# Patient Record
Sex: Female | Born: 1955 | Race: Black or African American | Hispanic: No | Smoking: Current every day smoker
Health system: Southern US, Community
[De-identification: ages and names within clinical notes are randomized; demographics above are authoritative.]

## PROBLEM LIST (undated history)

## (undated) DIAGNOSIS — I2510 Atherosclerotic heart disease of native coronary artery without angina pectoris: Secondary | ICD-10-CM

## (undated) DIAGNOSIS — J45909 Unspecified asthma, uncomplicated: Secondary | ICD-10-CM

## (undated) DIAGNOSIS — I10 Essential (primary) hypertension: Secondary | ICD-10-CM

## (undated) DIAGNOSIS — M1990 Unspecified osteoarthritis, unspecified site: Secondary | ICD-10-CM

## (undated) DIAGNOSIS — R011 Cardiac murmur, unspecified: Secondary | ICD-10-CM

## (undated) DIAGNOSIS — K5730 Diverticulosis of large intestine without perforation or abscess without bleeding: Secondary | ICD-10-CM

## (undated) DIAGNOSIS — I5030 Unspecified diastolic (congestive) heart failure: Secondary | ICD-10-CM

## (undated) DIAGNOSIS — I428 Other cardiomyopathies: Secondary | ICD-10-CM

## (undated) DIAGNOSIS — I639 Cerebral infarction, unspecified: Secondary | ICD-10-CM

## (undated) DIAGNOSIS — M25561 Pain in right knee: Secondary | ICD-10-CM

## (undated) DIAGNOSIS — I219 Acute myocardial infarction, unspecified: Secondary | ICD-10-CM

## (undated) DIAGNOSIS — G8929 Other chronic pain: Secondary | ICD-10-CM

## (undated) DIAGNOSIS — F0390 Unspecified dementia without behavioral disturbance: Secondary | ICD-10-CM

## (undated) DIAGNOSIS — M25562 Pain in left knee: Secondary | ICD-10-CM

## (undated) DIAGNOSIS — Z993 Dependence on wheelchair: Secondary | ICD-10-CM

## (undated) DIAGNOSIS — J42 Unspecified chronic bronchitis: Secondary | ICD-10-CM

## (undated) HISTORY — PX: ABDOMINAL HYSTERECTOMY: SHX81

## (undated) HISTORY — PX: CORONARY STENT PLACEMENT: SHX1402

---

## 1997-09-18 ENCOUNTER — Encounter: Admission: RE | Admit: 1997-09-18 | Discharge: 1997-09-18 | Payer: Self-pay | Admitting: Family Medicine

## 1997-10-11 ENCOUNTER — Ambulatory Visit: Admission: RE | Admit: 1997-10-11 | Discharge: 1997-10-11 | Payer: Self-pay | Admitting: Obstetrics

## 1997-10-30 ENCOUNTER — Encounter: Admission: RE | Admit: 1997-10-30 | Discharge: 1997-10-30 | Payer: Self-pay | Admitting: Family Medicine

## 1997-11-24 ENCOUNTER — Encounter: Admission: RE | Admit: 1997-11-24 | Discharge: 1997-11-24 | Payer: Self-pay | Admitting: Family Medicine

## 1997-12-27 ENCOUNTER — Encounter: Admission: RE | Admit: 1997-12-27 | Discharge: 1997-12-27 | Payer: Self-pay | Admitting: Family Medicine

## 1998-01-05 ENCOUNTER — Encounter: Admission: RE | Admit: 1998-01-05 | Discharge: 1998-01-05 | Payer: Self-pay | Admitting: Family Medicine

## 1998-02-05 ENCOUNTER — Encounter: Admission: RE | Admit: 1998-02-05 | Discharge: 1998-02-05 | Payer: Self-pay | Admitting: Internal Medicine

## 1998-02-28 ENCOUNTER — Encounter: Admission: RE | Admit: 1998-02-28 | Discharge: 1998-02-28 | Payer: Self-pay | Admitting: Family Medicine

## 1998-04-17 ENCOUNTER — Encounter: Admission: RE | Admit: 1998-04-17 | Discharge: 1998-04-17 | Payer: Self-pay | Admitting: Family Medicine

## 1998-04-26 ENCOUNTER — Emergency Department (HOSPITAL_COMMUNITY): Admission: EM | Admit: 1998-04-26 | Discharge: 1998-04-26 | Payer: Self-pay | Admitting: Emergency Medicine

## 1998-04-29 ENCOUNTER — Emergency Department (HOSPITAL_COMMUNITY): Admission: EM | Admit: 1998-04-29 | Discharge: 1998-04-29 | Payer: Self-pay | Admitting: Emergency Medicine

## 1998-07-24 ENCOUNTER — Inpatient Hospital Stay (HOSPITAL_COMMUNITY): Admission: EM | Admit: 1998-07-24 | Discharge: 1998-07-25 | Payer: Self-pay | Admitting: Emergency Medicine

## 1998-07-31 ENCOUNTER — Ambulatory Visit (HOSPITAL_COMMUNITY): Admission: RE | Admit: 1998-07-31 | Discharge: 1998-07-31 | Payer: Self-pay | Admitting: Obstetrics

## 1998-07-31 ENCOUNTER — Encounter: Admission: RE | Admit: 1998-07-31 | Discharge: 1998-07-31 | Payer: Self-pay | Admitting: Obstetrics & Gynecology

## 1998-07-31 ENCOUNTER — Other Ambulatory Visit: Admission: RE | Admit: 1998-07-31 | Discharge: 1998-07-31 | Payer: Self-pay | Admitting: Obstetrics & Gynecology

## 1998-07-31 ENCOUNTER — Encounter: Admission: RE | Admit: 1998-07-31 | Discharge: 1998-07-31 | Payer: Self-pay | Admitting: Internal Medicine

## 1998-08-02 ENCOUNTER — Encounter: Payer: Self-pay | Admitting: Obstetrics & Gynecology

## 1998-08-02 ENCOUNTER — Ambulatory Visit (HOSPITAL_COMMUNITY): Admission: RE | Admit: 1998-08-02 | Discharge: 1998-08-02 | Payer: Self-pay | Admitting: Obstetrics & Gynecology

## 1998-08-15 ENCOUNTER — Inpatient Hospital Stay (HOSPITAL_COMMUNITY): Admission: RE | Admit: 1998-08-15 | Discharge: 1998-08-20 | Payer: Self-pay | Admitting: Obstetrics & Gynecology

## 1998-08-23 ENCOUNTER — Encounter: Admission: RE | Admit: 1998-08-23 | Discharge: 1998-08-23 | Payer: Self-pay | Admitting: Obstetrics

## 1998-08-31 ENCOUNTER — Inpatient Hospital Stay (HOSPITAL_COMMUNITY): Admission: AD | Admit: 1998-08-31 | Discharge: 1998-08-31 | Payer: Self-pay | Admitting: Obstetrics & Gynecology

## 1998-09-26 ENCOUNTER — Encounter: Admission: RE | Admit: 1998-09-26 | Discharge: 1998-09-26 | Payer: Self-pay | Admitting: Family Medicine

## 1998-10-29 ENCOUNTER — Encounter: Admission: RE | Admit: 1998-10-29 | Discharge: 1998-10-29 | Payer: Self-pay | Admitting: Internal Medicine

## 1998-12-03 ENCOUNTER — Encounter: Admission: RE | Admit: 1998-12-03 | Discharge: 1998-12-03 | Payer: Self-pay | Admitting: Internal Medicine

## 1999-02-13 ENCOUNTER — Encounter: Admission: RE | Admit: 1999-02-13 | Discharge: 1999-02-13 | Payer: Self-pay | Admitting: Internal Medicine

## 1999-05-23 ENCOUNTER — Emergency Department (HOSPITAL_COMMUNITY): Admission: EM | Admit: 1999-05-23 | Discharge: 1999-05-23 | Payer: Self-pay | Admitting: Emergency Medicine

## 1999-05-24 ENCOUNTER — Encounter: Payer: Self-pay | Admitting: Emergency Medicine

## 1999-07-12 ENCOUNTER — Encounter: Admission: RE | Admit: 1999-07-12 | Discharge: 1999-07-12 | Payer: Self-pay | Admitting: Internal Medicine

## 1999-08-23 ENCOUNTER — Encounter: Admission: RE | Admit: 1999-08-23 | Discharge: 1999-08-23 | Payer: Self-pay | Admitting: Internal Medicine

## 1999-11-26 ENCOUNTER — Emergency Department (HOSPITAL_COMMUNITY): Admission: EM | Admit: 1999-11-26 | Discharge: 1999-11-27 | Payer: Self-pay | Admitting: Emergency Medicine

## 1999-11-27 ENCOUNTER — Emergency Department (HOSPITAL_COMMUNITY): Admission: EM | Admit: 1999-11-27 | Discharge: 1999-11-27 | Payer: Self-pay | Admitting: Emergency Medicine

## 1999-11-27 ENCOUNTER — Encounter: Payer: Self-pay | Admitting: Emergency Medicine

## 2000-02-20 ENCOUNTER — Encounter: Admission: RE | Admit: 2000-02-20 | Discharge: 2000-02-20 | Payer: Self-pay | Admitting: Obstetrics

## 2000-09-24 ENCOUNTER — Ambulatory Visit (HOSPITAL_COMMUNITY): Admission: RE | Admit: 2000-09-24 | Discharge: 2000-09-24 | Payer: Self-pay

## 2000-09-24 ENCOUNTER — Encounter: Admission: RE | Admit: 2000-09-24 | Discharge: 2000-09-24 | Payer: Self-pay

## 2001-08-15 ENCOUNTER — Emergency Department (HOSPITAL_COMMUNITY): Admission: EM | Admit: 2001-08-15 | Discharge: 2001-08-15 | Payer: Self-pay | Admitting: Emergency Medicine

## 2001-08-15 ENCOUNTER — Encounter: Payer: Self-pay | Admitting: Emergency Medicine

## 2001-09-21 ENCOUNTER — Emergency Department (HOSPITAL_COMMUNITY): Admission: EM | Admit: 2001-09-21 | Discharge: 2001-09-21 | Payer: Self-pay | Admitting: Emergency Medicine

## 2001-11-29 ENCOUNTER — Emergency Department (HOSPITAL_COMMUNITY): Admission: EM | Admit: 2001-11-29 | Discharge: 2001-11-29 | Payer: Self-pay | Admitting: Emergency Medicine

## 2002-03-08 ENCOUNTER — Encounter: Admission: RE | Admit: 2002-03-08 | Discharge: 2002-03-08 | Payer: Self-pay | Admitting: Internal Medicine

## 2002-03-08 ENCOUNTER — Ambulatory Visit (HOSPITAL_COMMUNITY): Admission: RE | Admit: 2002-03-08 | Discharge: 2002-03-08 | Payer: Self-pay | Admitting: Internal Medicine

## 2003-01-11 ENCOUNTER — Encounter: Admission: RE | Admit: 2003-01-11 | Discharge: 2003-01-11 | Payer: Self-pay | Admitting: Infectious Diseases

## 2004-10-21 ENCOUNTER — Ambulatory Visit: Payer: Self-pay | Admitting: Internal Medicine

## 2004-11-05 ENCOUNTER — Ambulatory Visit: Payer: Self-pay | Admitting: Internal Medicine

## 2004-11-13 ENCOUNTER — Ambulatory Visit: Payer: Self-pay | Admitting: Internal Medicine

## 2004-11-13 ENCOUNTER — Ambulatory Visit (HOSPITAL_COMMUNITY): Admission: RE | Admit: 2004-11-13 | Discharge: 2004-11-13 | Payer: Self-pay | Admitting: Internal Medicine

## 2004-11-14 ENCOUNTER — Ambulatory Visit: Payer: Self-pay | Admitting: Internal Medicine

## 2004-11-20 ENCOUNTER — Ambulatory Visit: Payer: Self-pay | Admitting: Internal Medicine

## 2004-11-21 ENCOUNTER — Encounter: Admission: RE | Admit: 2004-11-21 | Discharge: 2005-02-19 | Payer: Self-pay | Admitting: Internal Medicine

## 2004-11-27 ENCOUNTER — Ambulatory Visit: Payer: Self-pay | Admitting: Internal Medicine

## 2004-12-25 ENCOUNTER — Encounter: Payer: Self-pay | Admitting: Cardiovascular Disease

## 2004-12-25 ENCOUNTER — Ambulatory Visit: Payer: Self-pay | Admitting: Cardiovascular Disease

## 2004-12-25 ENCOUNTER — Ambulatory Visit: Payer: Self-pay | Admitting: Internal Medicine

## 2004-12-25 ENCOUNTER — Ambulatory Visit (HOSPITAL_COMMUNITY): Admission: RE | Admit: 2004-12-25 | Discharge: 2004-12-25 | Payer: Self-pay | Admitting: Internal Medicine

## 2005-01-09 ENCOUNTER — Ambulatory Visit: Payer: Self-pay | Admitting: Internal Medicine

## 2005-02-13 ENCOUNTER — Ambulatory Visit: Payer: Self-pay | Admitting: Internal Medicine

## 2005-02-19 ENCOUNTER — Ambulatory Visit (HOSPITAL_COMMUNITY): Admission: RE | Admit: 2005-02-19 | Discharge: 2005-02-19 | Payer: Self-pay | Admitting: Internal Medicine

## 2005-03-27 ENCOUNTER — Ambulatory Visit: Payer: Self-pay | Admitting: Internal Medicine

## 2005-11-24 ENCOUNTER — Ambulatory Visit: Payer: Self-pay | Admitting: Internal Medicine

## 2006-03-26 DIAGNOSIS — J449 Chronic obstructive pulmonary disease, unspecified: Secondary | ICD-10-CM

## 2006-03-26 DIAGNOSIS — K219 Gastro-esophageal reflux disease without esophagitis: Secondary | ICD-10-CM

## 2006-03-26 DIAGNOSIS — M1990 Unspecified osteoarthritis, unspecified site: Secondary | ICD-10-CM | POA: Insufficient documentation

## 2006-03-26 DIAGNOSIS — I428 Other cardiomyopathies: Secondary | ICD-10-CM | POA: Insufficient documentation

## 2006-05-10 ENCOUNTER — Emergency Department (HOSPITAL_COMMUNITY): Admission: EM | Admit: 2006-05-10 | Discharge: 2006-05-10 | Payer: Self-pay | Admitting: Emergency Medicine

## 2006-06-30 DIAGNOSIS — F17210 Nicotine dependence, cigarettes, uncomplicated: Secondary | ICD-10-CM

## 2006-08-02 ENCOUNTER — Inpatient Hospital Stay (HOSPITAL_COMMUNITY): Admission: EM | Admit: 2006-08-02 | Discharge: 2006-08-08 | Payer: Self-pay | Admitting: Emergency Medicine

## 2006-09-22 ENCOUNTER — Encounter: Payer: Self-pay | Admitting: Internal Medicine

## 2007-02-08 ENCOUNTER — Emergency Department (HOSPITAL_COMMUNITY): Admission: EM | Admit: 2007-02-08 | Discharge: 2007-02-08 | Payer: Self-pay | Admitting: *Deleted

## 2007-02-27 ENCOUNTER — Encounter: Admission: RE | Admit: 2007-02-27 | Discharge: 2007-02-27 | Payer: Self-pay | Admitting: Orthopedic Surgery

## 2007-05-13 ENCOUNTER — Encounter: Payer: Self-pay | Admitting: Internal Medicine

## 2008-02-17 ENCOUNTER — Inpatient Hospital Stay (HOSPITAL_COMMUNITY): Admission: EM | Admit: 2008-02-17 | Discharge: 2008-02-27 | Payer: Self-pay | Admitting: Emergency Medicine

## 2008-02-24 ENCOUNTER — Encounter (INDEPENDENT_AMBULATORY_CARE_PROVIDER_SITE_OTHER): Payer: Self-pay | Admitting: Internal Medicine

## 2008-02-28 ENCOUNTER — Emergency Department (HOSPITAL_COMMUNITY): Admission: EM | Admit: 2008-02-28 | Discharge: 2008-02-28 | Payer: Self-pay | Admitting: Emergency Medicine

## 2008-03-17 ENCOUNTER — Emergency Department (HOSPITAL_COMMUNITY): Admission: EM | Admit: 2008-03-17 | Discharge: 2008-03-17 | Payer: Self-pay | Admitting: Emergency Medicine

## 2008-03-28 ENCOUNTER — Encounter: Admission: RE | Admit: 2008-03-28 | Discharge: 2008-05-08 | Payer: Self-pay | Admitting: Orthopedic Surgery

## 2008-04-26 ENCOUNTER — Emergency Department (HOSPITAL_COMMUNITY): Admission: EM | Admit: 2008-04-26 | Discharge: 2008-04-26 | Payer: Self-pay | Admitting: Emergency Medicine

## 2010-10-22 NOTE — Group Therapy Note (Signed)
NAME:  [name], [name]              ACCOUNT NO.:  [id number]   MEDICAL RECORD NO.:  [id number]          PATIENT TYPE:  INP   LOCATION:  5528                         FACILITY:  [facility]   PHYSICIAN:  Eduard Clos, MDDATE OF BIRTH:  1955-12-18                                 PROGRESS NOTE   COURSE IN THE HOSPITAL:  A 55 year old female with history of sigmoid diverticulitis and  hypertension previously presented with abdominal pain.  On admission,  the patient had a CT scan of the abdomen and pelvis, which showed  question of sigmoid diverticulitis and a 4-cm abscess in the central  pelvis.  The patient admitted and started on empiric antibiotic.  Surgery consult also was obtained.  Per surgery, interventional  radiology to be consulted for possible percutaneous drainage.  Per  interventional radiology, the patient's abscess was in the central  pelvis, difficult anatomy to drain it percutaneously and suggested many  organs and felt to continue the antibiotics and repeat CT scan when  appropriate.  At this time, the patient also was found to have a rising  creatinine level, in which the patient had a creatinine level of 0.85,  which started increasing to 2.8 to 3.47.  The patient was started on  aggressive IV fluids, and now the creatinine has decreased to 2.7.  Felt  to be secondary to contrast-induced and with mild hypotension this.  The  patient also is found now to be in sinus bradycardia, for which an EKG  will be ordered.  It is felt to be secondary to Catapres and  medications.  At this time, surgery is planning to repeat a CT scan  again as the patient still is tender in the abdomen.  Further  recommendations will be accordingly.   FINAL DIAGNOSES:  1. Sigmoid diverticulitis with abscess.  2. Acute renal failure, probably secondary to contrast-induced.  3. Sinus bradycardia, probably secondary to Catapres and pain relief      medication.  4. History of hypertension.   PLAN:  At this time, a CT scan of the abdomen and pelvis is planned to be again  scheduled once the patient's creatinine improves, to have an EKG to see  her rhythm as the patient has mild bradycardia.  We will stop the  Catapres and place her on p.r.n. hydralazine for blood pressure control,  and further recommendations as the patient's condition evolves.      Eduard Clos, MD  Electronically Signed     ANK/MEDQ  D:  02/22/2008  T:  02/22/2008  Job:  [id number]

## 2010-10-22 NOTE — Discharge Summary (Signed)
[name], [name]              ACCOUNT NO.:  [id number]   MEDICAL RECORD NO.:  [id number]          PATIENT TYPE:  INP   LOCATION:  5528                         FACILITY:  [facility]   PHYSICIAN:  Altha Harm, MDDATE OF BIRTH:  1955/07/17   DATE OF ADMISSION:  02/17/2008  DATE OF DISCHARGE:  02/27/2008                               DISCHARGE SUMMARY   DISCHARGE DISPOSITION:  Home.   FINAL DISCHARGE DIAGNOSES:  1. Sigmoid diverticulitis with abscess.  2  Colovaginal fistula.  1. Medication-induced bradycardia, now resolved.  2. Generalized weakness.  3. Hypertension.  4. History of coronary artery disease with myocardial infarction in      the past.  5. Chronic renal insufficiency.  6. Chronic kidney disease, stage III.   DISCHARGE MEDICATIONS:  1. Amlodipine 5 mg p.o. daily.  2. Isosorbide mononitrate 30 mg p.o. daily.  3. Doxazosin 2 mg p.o. daily.  4. Protonix 40 mg p.o. daily.  5. Flagyl 500 mg p.o. q.8 h., x2 weeks.  6. Ciprofloxacin 500 mg p.o. daily x2 weeks.   CONSULTANTS:  1. Dr. Luretha Murphy, surgery.  2. Dr. Lynnea Ferrier, cardiology.   DIAGNOSTIC STUDIES:  1. A 2-D echocardiogram which showed overall left ventricular systolic      function normal.  No diagnostic evidence of left ventricular      regional wall motion abnormalities.  Left ventricular wall      thickness markedly increased.  Doppler evidence for a dynamic left      ventricular outflow tract obstruction at rest.  Doppler parameters      consistent with a normal left ventricular relaxation.  2. CT of the abdomen and pelvis without contrast done on February 23, 2008, which showed (1) small to moderate bilateral pleural      effusions, (2) stranding within the left soft-tissue of the      anterior lateral left flank and right back, questionable      significance, no abscess, (3)  colovaginal fistula suspected.      There has been some decrease in the size of the mid pelvic abscess  cavity noted previously.  No change in sigmoid colon      diverticulosis.   CODE STATUS:  Full code.   ALLERGIES:  PENICILLIN.   CHIEF COMPLAINT:  Abdominal pain.   HISTORY OF PRESENT ILLNESS:  Please see H and P dictated by Dr.  Ardyth Harps on February 17, 2008, for details of the HPI.  However, in  short, this is a 55 year old [name] who presents with  left lower quadrant pain x3 days.   HOSPITAL COURSE:  Please note that there is an interim summary done up  until February 22, 2008.  This summary summarizes hospital course from  February 23, 2008, through February 27, 2008.   1. Sigmoid abscess and colovaginal fistula.  The patient has sigmoid      abscess and diverticulitis.  A CT scan of the abdomen showed a      colovaginal fistula.  In consultation with Dr. Daphine Deutscher, it was      decided that  the patient would continue on oral antibiotic      ciprofloxacin for an additional 2 weeks and then return to see him      at which time he would determine whether or not surgical      intervention was necessary.  Thus, the patient is being discharged      on Flagyl and ciprofloxacin for a total of 2 weeks and then to      follow up with Dr. Daphine Deutscher in the clinic to determine the further      course of therapy for her fistula and abscess.  2. Sinus bradycardia.  The patient had been on multiple rate-limiting      medications, including doxazosin.  However, the patient had been on      a large dose of trazodone and it was felt that this may have been      the contributory factor to her bradycardia.  However, given the      patient's history of coronary artery disease and previous MI, it      was felt that it warranted further intervention.  Dr. Lynnea Ferrier of      cardiology was consulted and saw the patient in consultation.  By      this time, the trazodone had been stopped, in addition to the      clonidine and the doxazosin.  The patient over several days      recovered at  to a normal cardiac rate of heart beat.  Doxazosin at      night was reintroduced.  The patient was cautioned not to resume      her trazodone as it was felt that this was the major contributing      factor to her bradycardia.  The patient is being discharged on      trazodone 2 mg p.o. nightly, as well as Norvasc to control her      blood pressure.  3. Hypertension.  The patient was on Norvasc, trazodone and clonidine.      However, because of the bradycardia, the clonidine was      discontinued.  While off of the doxazosin and clonidine, she had an      increase in blood pressure, however, when the doxazosin was      reintroduced, her blood pressures were controlled.  She is being      sent home on Norvasc and doxazosin for her blood pressure      management.  Otherwise, the patient remained stable.  The patient      has a limited understanding of her condition and instructions, thus      instructions were given to the patient's daughter and      granddaughter, both by myself and the nurse.  They demonstrated      understanding.   DIETARY RESTRICTIONS:  Include a low-sodium, low-cholesterol diet, and  in particular, the patient was advised to be on a low residue diet and  written instructions given to that effect.   PHYSICAL RESTRICTIONS:  None.   FOLLOW UP:  The patient was instructed to call Dr. Ermalene Searing office for  follow-up.  She is to continue on the oral antibiotics for a total of 2  weeks, and then Dr. Daphine Deutscher intends see her in the office possibly for a  one-step colostomy for treatment of her abscess and colovaginal fistula.      Altha Harm, MD  Electronically Signed     MAM/MEDQ  D:  03/29/2008  T:  03/29/2008  Job:  [id number]

## 2010-10-22 NOTE — Consult Note (Signed)
NAME:  [name], [name]              ACCOUNT NO.:  [id number]   MEDICAL RECORD NO.:  [id number]          PATIENT TYPE:  INP   LOCATION:  5151                         FACILITY:  [facility]   PHYSICIAN:  Gabrielle Dare. Janee Morn, M.D.DATE OF BIRTH:  06-Nov-1955   DATE OF CONSULTATION:  DATE OF DISCHARGE:                                 CONSULTATION   CHIEF COMPLAINT:  Lower abdominal pain.   HISTORY OF PRESENT ILLNESS:  Ms. Escamilla is a pleasant 55 year old  African American female who came to the emergency department complaining  of lower abdominal pain in the suprapubic region.  She complains it is  similar to a previous episode of diverticulitis which she had in 2008.  At that time, she was seen by my partner Dr. Chevis Pretty.  It was  recommended after she improved with medical treatment that she have  elective sigmoid colectomy.  However, she did not want to have surgery  that time.  Currently, she complains of a lower abdominal pain and in  addition has some chronic back pain issues that she also complains of.   PAST MEDICAL HISTORY:  1. Chronic back pain.  2. Cerebrovascular accident.  3. Previous episode of diverticulitis.  4. Hypertension.  5. Myocardial infarction.   PAST SURGICAL HISTORY:  Open cholecystectomy, appendectomy,  hysterectomy, and right oophorectomy.   MEDICATIONS:  Include, amlodipine, Benicar, clonidine, and trazodone.   ALLERGIES:  PENICILLIN.   SOCIAL HISTORY:  She smokes cigarettes.  She occasionally drinks  alcohol.   REVIEW OF SYSTEMS:  CARDIAC:  Negative.  PULMONARY:  Negative.  GI:  As above.  GU:  Negative.  MUSCULOSKELETAL:  A chronic back pain with pain going down her right leg  and she uses a cane to walk.   Remainder of the review of systems was unremarkable.   PHYSICAL EXAMINATION:  VITAL SIGNS:  Temperature 98.7, blood pressure  172/89, heart rate 65, respirations 16, and saturations 95% on room air.  GENERAL:  She is awake and alert.  HEENT:   She wears glasses.  Oral mucosa is moist.  NECK:  Supple.  No tenderness.  LUNGS:  Clear to auscultation.  No wheezing is heard.  HEART:  Regular.  No murmurs are heard.  Impulse is palpable in the left  chest.  ABDOMEN:  Soft.  She has some tenderness in the suprapubic and bilateral  lower quadrant region, right side greater than left side.  There is no  generalized tenderness or guarding.  There is no peritoneal signs.  Bowel sounds are present.  No masses are felt.  EXTREMITIES:  No significant edema.  SKIN:  Warm and dry.  No rashes are noted.  Surgical scars are present  in her abdominal skin.   Data reviewed includes, white blood cell count 10.9, hemoglobin 15.8.  Basic metabolic panel is unremarkable with the exception of glucose of  121, AST 18, ALT 9, lipase 19.  A CT scan of the abdomen and pelvis show  sigmoid diverticulitis with 4- cm abscess in her pelvis.   IMPRESSION:  Sigmoid diverticulitis with abscess.   RECOMMENDATIONS:  I agree  with medical admission and IV antibiotics.  Cipro and Flagyl were ordered in the emergency department.  We would  also recommend CT-guided drainage of this abscess in the a.m.  We will  follow along with you.  Hopefully, she will improve with medical  treatment.  I do feel she will need an elective sigmoid colectomy once  her acute illness has resolved.      Gabrielle Dare Janee Morn, M.D.  Electronically Signed     BET/MEDQ  D:  02/17/2008  T:  02/18/2008  Job:  [id number]   cc:   Peggye Pitt, M.D.

## 2010-10-22 NOTE — H&P (Signed)
NAME:  [name], [name]              ACCOUNT NO.:  [id number]   MEDICAL RECORD NO.:  [id number]          PATIENT TYPE:  EMS   LOCATION:  MAJO                         FACILITY:  [facility]   PHYSICIAN:  Peggye Pitt, M.D. DATE OF BIRTH:  Nov 12, 1955   DATE OF ADMISSION:  02/17/2008  DATE OF DISCHARGE:                              HISTORY & PHYSICAL   PRIMARY CARE PHYSICIAN:  She is unassigned to [place].   CHIEF COMPLAINT:  Abdominal pain.   HISTORY OF PRESENT ILLNESS:  [name] is a 55 year old African  American woman who presents with left lower quadrant abdominal pain x3  days.  She does endorse nausea but denies any actual emesis.  She states  she has had shaking chills.  She had an admission in 2008, for a sigmoid  diverticulitis and states the pain is the same.   ALLERGIES:  She stated allergies to PENICILLIN.   PAST MEDICAL HISTORY:  Significant for:  1. Hypertension.  2. History of myocardial infarction, she is unsure of the date of      this.   HOME MEDICATIONS:  1. Amlodipine 5 mg p.o. daily.  2. Benicar 20 mg p.o. daily.  3. Clonidine 0.2 mg p.o. b.i.d.  4. Trazodone 25 mg p.o. daily.   SOCIAL HISTORY:  She denies any alcohol, tobacco, or illicit drug use.  She is retired.   FAMILY HISTORY:  Noncontributory.   REVIEW OF SYSTEMS:  A 10-point system review was performed, which is  negative except as per HPI.   PHYSICAL EXAMINATION:  VITAL SIGNS:  Blood pressure initially upon  arrival is 222/104, which decreased to 172/89 simply with pain  medication, heart rate 79, respiration 16, and O2 sat 98% on room air.  GENERAL:  She is alert, awake, and oriented x3 and in mild distress  secondary to abdominal pain.  HEENT:  Normocephalic and atraumatic.  Her pupils are equal and reactive  to light and accommodation with intact extraocular movements.  NECK:  Supple with no JVD.  No lymphadenopathy or bruits.  LUNGS:  Clear to auscultation bilaterally.  HEART:  Regular rate  and rhythm with no murmurs, rubs, or gallops.  ABDOMEN:  Obese and very tender to palpation to the left lower quadrant  specifically, but also in the suprapubic and the left upper quadrant  region.  She has hyperactive bowel sounds.  No masses or organomegaly  noted.  She has negative rebound.   LABORATORY DATA:  Labs upon admission, sodium 139, potassium 3.7,  chloride 101, bicarb 29, BUN 5, creatinine 0.85 with a glucose of 121.  All of her LFTs are within normal limits.  She has WBC of 10.9 with an  ANC of 9.4, hemoglobin 15.8, and platelets of 194.  Lipase of 19 and a  negative urine analysis.  She had a CT scan of her abdomen and pelvis  that was consistent with sigmoid diverticulitis with a 4-cm abscess in  the central pelvis.   ASSESSMENT AND PLAN:  1. For sigmoid diverticulitis, we will admit her to regular floor,      give intravenous fluids.  We  will treat medically with      ciprofloxacin and Flagyl.  Given the abscess, I have consulted      Surgery and they have recommended medical management and      Interventional Radiology consult for a possible drainage of the      abscess and given her recurrent diverticulitis, they also recommend      a possible elective sigmoid colectomy.  2. For her hypertension, we will continue her home medications.  3. For prophylaxis while in the hospital, place on Protonix for      gastrointestinal prophylaxis and on a subcutaneous heparin for deep      vein thrombosis prophylaxis.      Peggye Pitt, M.D.  Electronically Signed     EH/MEDQ  D:  02/17/2008  T:  02/18/2008  Job:  [id number]

## 2010-10-25 NOTE — H&P (Signed)
[name], [name]              ACCOUNT NO.:  [id number]   MEDICAL RECORD NO.:  [id number]          PATIENT TYPE:  INP   LOCATION:  0105                         FACILITY:  [facility]   PHYSICIAN:  Ollen Gross. Vernell Morgans, M.D. DATE OF BIRTH:  02-28-1956   DATE OF ADMISSION:  08/02/2006  DATE OF DISCHARGE:                              HISTORY & PHYSICAL   [name] is a 55-year black female who presents to emergency  department today with a couple week history of low pelvic pain.  The  pain has gotten steadily worse during the last 2 weeks.  She denies any  fevers at home.  She has had some diarrhea.  She has not had any nausea  or vomiting.  No chest pain, shortness of breath.  No dysuria.  She has  not noticed any blood in her stools.  The rest of the review of systems  unremarkable.   PAST MEDICAL HISTORY:  Significant for hypertension, coronary artery  disease, MI x2, stroke x2.   PAST SURGICAL HISTORY:  Significant for cholecystectomy, appendectomy,  hysterectomy, right oophorectomy.   MEDICATIONS:  Include Darvocet, trazodone, hydrochlorothiazide and  clonidine.   ALLERGIES:  PENICILLIN.   SOCIAL HISTORY:  She does smoke cigarettes and occasionally drinks  alcohol.   FAMILY HISTORY:  Is noncontributory.   PHYSICAL EXAM:  VITAL SIGNS:  Her temperature 98.5, blood pressure  236/96, pulse 57.  GENERAL:  She is a well-nourished black female in no acute distress.  SKIN:  Warm and dry, no jaundice.  EYES:  Extraocular muscles intact. Pupils equal, round, reactive to  light.  Sclerae nonicteric.  LUNGS:  Clear bilaterally with no use of accessory muscles.  HEART:  Regular rate and rhythm with an impulse in the left chest.  ABDOMEN:  Soft.  She has got moderate pubic tenderness centrally but no  guarding or peritonitis.  No palpable mass or hepatosplenomegaly.  EXTREMITIES:  No cyanosis, clubbing or edema.  Has good strength in arms  and legs.  PSYCH:  Psychologically, she is  alert and oriented x3, with no evidence  of anxiety or depression.  On review of her lab work, it was significant  for a white count of 8,900.  Her CT scan is reviewed and did show some  inflammation of the sigmoid colon with a small 2- cm abscess between the  colon and the left adnexal area.   ASSESSMENT/PLAN:  This is a 55 year old black female with what appears  to be an infectious process of her sigmoid colon, of her pelvic area  involving her sigmoid colon.  It is not completely clear whether this is  a tubo-ovarian abscess or sigmoid diverticulitis, but is probably most  likely a sigmoid diverticulitis with a small abscess.  Because she shows  no signs of sepsis or peritonitis, I think it would be reasonable to  admit her for broad spectrum antibiotic therapy and follow her closely.  Will also talk to interventional radiology to see if this abscess can be  drained, and we will consult the medical teaching service to help [place]  manage her hypertension,  coronary artery  disease during this hospitalization.  She will probably need cardiac  clearance as well.  We will ask for the help of the medical doctors with  this, since we do not know who her cardiologist was when she had her  heart attack.  She understands that if she does not improve, that she  may require exploration.      Ollen Gross. Vernell Morgans, M.D.  Electronically Signed     PST/MEDQ  D:  08/02/2006  T:  08/02/2006  Job:  [id number]

## 2010-10-25 NOTE — Discharge Summary (Signed)
NAMETRINITEY, ROACHE              ACCOUNT NO.:  [id number]   MEDICAL RECORD NO.:  [id number]          PATIENT TYPE:  INP   LOCATION:  1343                         [facility]   PHYSICIAN:  Ollen Gross. Vernell Morgans, M.D. DATE OF BIRTH:  06-19-1955   DATE OF ADMISSION:  08/02/2006  DATE OF DISCHARGE:  08/08/2006                               DISCHARGE SUMMARY   Ms. Ende is a 55 year old black female who was admitted initially  with pelvic pain and diarrhea.  She had a normal white count at that  time but significant hypertension.  She had a CT scan that showed some  inflammation of the sigmoid colon with small abscess.  She had no signs  of peritonitis, and she was admitted for hydration, IV antibiotics, and  bowel rest.  She improved.  The medical service was contacted to manage  her hypertension and coronary artery disease.  She did well on IV  antibiotics and slowly improved, and by August 08, 2006 she was switched  to oral antibiotics, she was tolerating a low-residue diet, and was  ready for discharge home.  She was given Cipro and Flagyl for  antibiotics.  Her antihypertensives were managed by the medical service.  Follow up will be with Dr. Carolynne Edouard in a couple of weeks.   FINAL DIAGNOSIS:  Sigmoid diverticulitis with abscess.   CONDITION ON DISCHARGE:  Stable.   DIET:  Low residue.   DISPOSITION:  She is discharged home.      Ollen Gross. Vernell Morgans, M.D.  Electronically Signed     PST/MEDQ  D:  11/27/2006  T:  11/27/2006  Job:  [id number]

## 2010-10-25 NOTE — Consult Note (Signed)
[name], [name]              ACCOUNT NO.:  [id number]   MEDICAL RECORD NO.:  [id number]          PATIENT TYPE:  INP   LOCATION:  0105                         FACILITY:  [facility]   PHYSICIAN:  Madaline Savage, MD        DATE OF BIRTH:  04-Nov-1955   DATE OF CONSULTATION:  08/02/2006  DATE OF DISCHARGE:                                 CONSULTATION   PRIMARY CARE PHYSICIAN:  Ellie Lunch, M.D., [facility].   SURGEON:  Ollen Gross. Vernell Morgans, M.D.   HISTORY OF PRESENT ILLNESS:  [name] is a 55 year old lady with a  history of hypertension, coronary artery disease, and multiple strokes  in the past who comes in with complaint of abdominal pain of one week  duration. She says the abdominal pain is in the hypogastrium and has  been getting worse for the last one week.  She rates her pain at 9/10.  She denies having any aggravating or alleviating factors.  She states  the pain does not radiate.  She denies any nausea or vomiting but did  say that she had some diarrhea.  She denies any chest pain, any  shortness of breath, any palpitations, or any other complaints at this  point in time.   PAST MEDICAL HISTORY:  1. Hypertension.  2. History of coronary artery disease with MI x2 she says      approximately 5-10 years ago.  3. History of multiple strokes approximately again 5-10 years ago.      She says she does not have any residue weakness, but she does have      slurred speech which is secondary to her stroke.   PAST SURGICAL HISTORY:  1. Cholecystectomy.  2. Appendectomy.  3. Tonsillectomy.  4. She says one of her ovaries was removed in the past.   ALLERGIES:  She is allergic to PENICILLIN.  She is not sure of the  allergy.   CURRENT MEDICATIONS:  1. Triamterene/hydrochlorothiazide 37.5/25 one tablet once daily.  2. Clonidine 0.1 mg twice daily.  3. Trazodone 50 mg 1-2 tablets at bedtime.  4. Darvocet-N 100 as needed.   SOCIAL HISTORY:  She smokes approximately one pack in  one week.  She  says she started smoking at the age of 72.  She denies any drug abuse.  She takes alcohol socially.   FAMILY HISTORY:  Her dad passed away at the age of 67.  She was not sure  what he passed away from.  Her mother passed away when she was  approximately 17.  She had diabetes.   REVIEW OF SYSTEMS:  GENERAL:  She denies any recent weight loss, weight  gain.  No fever, chills.  HEENT: No headaches, no blurred vision.  CARDIOVASCULAR:  Denies chest pain or palpitations.  RESPIRATORY:  No  shortness of breath.  GI:  Does have pain in her hypogastrium.  No  nausea, vomiting, but does complain of diarrhea.  EXTREMITIES: No  tingling or numbness of toes.   PHYSICAL EXAMINATION:  GENERAL:  Alert and oriented x3.  VITAL SIGNS: Temperature 98.5, pulse  rate 57, blood pressure markedly  elevated at 236/91 on admission.  Now it has come down to 220/91.  Respiratory rate is 20 per minute.  Oxygen saturation 98%.  HEENT:  Normocephalic and atraumatic.  Pupils equal, round, and reactive  to light.  Mucous membranes moist.  NECK:  Supple.  No JVD or carotid bruit.  CARDIOVASCULAR:  S1, S2.  CHEST: Clear to auscultation.  ABDOMEN:  Soft.  There is tenderness in the hypogastrium.  Bowel sounds  are decreased.  EXTREMITIES:  No edema, cyanosis, or clubbing.   LABORATORY DATA:  White count 8.9, hemoglobin 15.9, hematocrit 46.6,  platelets 180.  Potassium 3.9, sodium 141, chloride 106, bicarb 29, BUN  10, creatinine 0.76, glucose 93.   CT abdomen showed inflammation in low pelvis, 2.5 cm ring enhancing  fluid/gas collection, questionable infection or abscess.  Sigmoidal wall  thickening would suggest colitis.   ASSESSMENT AND PLAN:  1. Uncontrolled hypertension. This is a 55 year old lady with a      history of hypertension.  Most likely her blood pressure was      uncontrolled as an outpatient.  She now has a blood pressure of      220/91.  She tells me that she has not taken her  blood pressure      medications today because of her abdominal pain.  Her blood      pressure is probably elevated secondary to her pain, too.  I would      restart her home medications.  I would add Norvasc to it.  I would      watch her response at this point in time and titrate medications      accordingly.  2. History of coronary artery disease status post myocardial      infarction x2.  She is chest pain free at this point in time.  I      will get a 12-lead EKG.  She denies having any pain.  I will start      her on aspirin.  3. History of stroke.  She has had multiple strokes in the past with      very high blood pressure rate.  Will have to watch for a      cerebrovascular event.  I would again start her on the aspirin .      Will continue to watch her.  4. Sigmoid diverticulitis with abscess.  Dr. Carolynne Edouard planning on draining      her abscess by interventional radiology.  She is on Cipro and      Flagyl at this point in time.  She is not septic.  She does not      look toxic at this point in time.  We will continue to follow this      patient with you.   Thank you for consulting this patient to [place].      Madaline Savage, MD  Electronically Signed     PKN/MEDQ  D:  08/02/2006  T:  08/02/2006  Job:  [id number]

## 2011-03-10 LAB — BASIC METABOLIC PANEL
BUN: 10
CO2: 21
CO2: 21
CO2: 21
Calcium: 8.5
Calcium: 8.7
Chloride: 117 — ABNORMAL HIGH
Chloride: 119 — ABNORMAL HIGH
Creatinine, Ser: 2.02 — ABNORMAL HIGH
Creatinine, Ser: 2.4 — ABNORMAL HIGH
Creatinine, Ser: 2.71 — ABNORMAL HIGH
GFR calc Af Amer: 26 — ABNORMAL LOW
GFR calc non Af Amer: 27 — ABNORMAL LOW
Glucose, Bld: 102 — ABNORMAL HIGH
Glucose, Bld: 83
Potassium: 4.3
Potassium: 5.2 — ABNORMAL HIGH
Potassium: 5.3 — ABNORMAL HIGH
Sodium: 139
Sodium: 142

## 2011-03-10 LAB — PHOSPHORUS: Phosphorus: 2.6

## 2011-03-10 LAB — COMPREHENSIVE METABOLIC PANEL
ALT: 22
AST: 36
Alkaline Phosphatase: 37 — ABNORMAL LOW
CO2: 20
Chloride: 109
GFR calc Af Amer: 30 — ABNORMAL LOW
GFR calc non Af Amer: 25 — ABNORMAL LOW
Sodium: 139
Total Bilirubin: 0.4

## 2011-03-10 LAB — TSH: TSH: 2.295

## 2011-03-10 LAB — CBC
Hemoglobin: 13.2
MCHC: 32.7
RBC: 4.8
WBC: 6.5

## 2011-03-10 LAB — T4, FREE: Free T4: 1.11

## 2011-03-10 LAB — MAGNESIUM: Magnesium: 1.9

## 2011-03-10 LAB — CLOSTRIDIUM DIFFICILE EIA

## 2011-03-12 LAB — COMPREHENSIVE METABOLIC PANEL
ALT: 9
AST: 18
Albumin: 3.7
Alkaline Phosphatase: 65
BUN: 5 — ABNORMAL LOW
Chloride: 101
GFR calc Af Amer: >60
Potassium: 3.7
Sodium: 139
Total Bilirubin: 0.9

## 2011-03-12 LAB — CULTURE, BLOOD (ROUTINE X 2)

## 2011-03-12 LAB — CLOSTRIDIUM DIFFICILE EIA

## 2011-03-12 LAB — CBC
HCT: 47.9 — ABNORMAL HIGH
Hemoglobin: 12.1
Platelets: 194
RDW: 16.1 — ABNORMAL HIGH
WBC: 10.9 — ABNORMAL HIGH

## 2011-03-12 LAB — GLUCOSE, CAPILLARY
Glucose-Capillary: 101 — ABNORMAL HIGH
Glucose-Capillary: 112 — ABNORMAL HIGH
Glucose-Capillary: 113 — ABNORMAL HIGH
Glucose-Capillary: 114 — ABNORMAL HIGH
Glucose-Capillary: 117 — ABNORMAL HIGH
Glucose-Capillary: 119 — ABNORMAL HIGH
Glucose-Capillary: 72
Glucose-Capillary: 74
Glucose-Capillary: 86
Glucose-Capillary: 97
Glucose-Capillary: 98

## 2011-03-12 LAB — URINALYSIS, ROUTINE W REFLEX MICROSCOPIC
Bilirubin Urine: NEGATIVE
Hgb urine dipstick: NEGATIVE
Protein, ur: 100 — AB
Urobilinogen, UA: 0.2

## 2011-03-12 LAB — URINE MICROSCOPIC-ADD ON

## 2011-03-12 LAB — BASIC METABOLIC PANEL
BUN: 11
BUN: 12
CO2: 22
Calcium: 7.9 — ABNORMAL LOW
Calcium: 8.2 — ABNORMAL LOW
Chloride: 114 — ABNORMAL HIGH
GFR calc Af Amer: 17 — ABNORMAL LOW
GFR calc non Af Amer: 14 — ABNORMAL LOW
GFR calc non Af Amer: 17 — ABNORMAL LOW
Glucose, Bld: 103 — ABNORMAL HIGH
Glucose, Bld: 97
Potassium: 3.1 — ABNORMAL LOW
Potassium: 4.8
Sodium: 139

## 2011-03-12 LAB — DIFFERENTIAL
Basophils Absolute: 0
Basophils Relative: 0
Eosinophils Absolute: 0
Eosinophils Relative: 0
Monocytes Absolute: 0.3

## 2011-03-21 LAB — D-DIMER, QUANTITATIVE: D-Dimer, Quant: 0.42

## 2011-08-13 ENCOUNTER — Emergency Department (HOSPITAL_COMMUNITY)
Admission: EM | Admit: 2011-08-13 | Discharge: 2011-08-13 | Disposition: A | Discharge status: Home / Self Care | Payer: Medicaid Other | Source: Home / Self Care | Attending: Emergency Medicine | Admitting: Emergency Medicine

## 2011-08-13 ENCOUNTER — Encounter (HOSPITAL_COMMUNITY): Payer: Self-pay

## 2011-08-13 DIAGNOSIS — IMO0002 Reserved for concepts with insufficient information to code with codable children: Secondary | ICD-10-CM

## 2011-08-13 HISTORY — DX: Essential (primary) hypertension: I10

## 2011-08-13 MED ORDER — TRAMADOL HCL 50 MG PO TABS
100.0000 mg | ORAL_TABLET | Freq: Three times a day (TID) | ORAL | Status: DC | PRN
Start: 2011-08-13 — End: 2011-08-15

## 2011-08-13 MED ORDER — SULFAMETHOXAZOLE-TMP DS 800-160 MG PO TABS: 2.0000 | ORAL_TABLET | Freq: Two times a day (BID) | ORAL | Status: DC

## 2011-08-13 NOTE — ED Provider Notes (Signed)
Chief Complaint  Patient presents with  . Hand Pain    History of Present Illness:   The patient has acrylic nails on her nails. About a week ago she partially tore off the acrylic nail in the right middle finger. The technician attempted to do this back down with acrylic glue. Over the past 3 days she's had swelling, redness, and pain over the tip of the finger.  Review of Systems:  Other than noted above, the patient denies any of the following symptoms: Systemic:  No fevers, chills, sweats, or aches.  No fatigue or tiredness. Musculoskeletal:  No joint pain, arthritis, bursitis, swelling, back pain, or neck pain. Neurological:  No muscular weakness, paresthesias, headache, or trouble with speech or coordination.  No dizziness.   PMFSH:  Past medical history, family history, social history, meds, and allergies were reviewed.  Physical Exam:   Vital signs:  BP 205/80  Pulse 98  Temp(Src) 98.1 F (36.7 C) (Oral)  Resp 18  SpO2 100% Gen:  Alert and oriented times 3.  In no distress. Musculoskeletal: Exam of right middle finger reveals a paronychia which runs around the entire nail fold extending both medially and laterally and over the top of the finger. There appears to be pus underneath this. The the finger is swollen, red, and very tender to touch. Otherwise, all joints had a full a ROM with no swelling, bruising or deformity.  No edema, pulses full. Extremities were warm and pink.  Capillary refill was brisk.  Skin:  Clear, warm and dry.  No rash. Neuro:  Alert and oriented times 3.  Muscle strength was normal.  Sensation was intact to light touch.   Procedure Note:  Verbal informed consent was obtained from the patient.  Risks and benefits were outlined with the patient.  Patient understands and accepts these risks.  Identity of the patient was confirmed verbally and by armband.    Procedure was performed as followed:  The finger was anesthetized with a digital block with 5 mL of 2%  Xylocaine without epinephrine. After satisfactory anesthesia had been obtained, the collection of pus was incised and a large amount of foul-smelling pus was drained out. This was cultured and thereafter attention was turned to the nail itself. The nail was elevated and then avulsed. It appears to be some acrylic material adhering to the nail plate which I did not attempt to remove. Antibiotic ointment was then applied and a sterile pressure dressing. The patient was instructed to leave this in place and return again in 2 days for a recheck.  Patient tolerated the procedure well without any immediate complications.   Assessment:   Diagnoses that have been ruled out:  None  Diagnoses that are still under consideration:  None  Final diagnoses:  Paronychia    Plan:   1.  The following meds were prescribed:   New Prescriptions   SULFAMETHOXAZOLE-TRIMETHOPRIM (BACTRIM DS) 800-160 MG PER TABLET    Take 2 tablets by mouth 2 (two) times daily.   TRAMADOL (ULTRAM) 50 MG TABLET    Take 2 tablets (100 mg total) by mouth every 8 (eight) hours as needed for pain.   2.  The patient was instructed in symptomatic care, including rest and activity, elevation, application of ice and compression.  Appropriate handouts were given. 3.  The patient was told to return if becoming worse in any way, if no better in 3 or 4 days, and given some red flag symptoms that would indicate earlier  return.   4.  The patient was told to follow up here in 48 hours for recheck.   Roque Lias, MD 08/13/11 2059

## 2011-08-13 NOTE — ED Notes (Addendum)
Reportedly broke her fingernail right middle finger sometime back. Had an acyrlic nail applied the first of February to "help it grow out" was concerned for poss infection of the finger

## 2011-08-13 NOTE — Discharge Instructions (Signed)

## 2011-08-15 ENCOUNTER — Encounter (HOSPITAL_COMMUNITY): Payer: Self-pay

## 2011-08-15 ENCOUNTER — Emergency Department (INDEPENDENT_AMBULATORY_CARE_PROVIDER_SITE_OTHER)
Admission: EM | Admit: 2011-08-15 | Discharge: 2011-08-15 | Disposition: A | Discharge status: Home Health | Payer: Medicaid Other | Source: Home / Self Care | Attending: Emergency Medicine | Admitting: Emergency Medicine

## 2011-08-15 DIAGNOSIS — IMO0002 Reserved for concepts with insufficient information to code with codable children: Secondary | ICD-10-CM

## 2011-08-15 MED ORDER — TRAMADOL HCL 50 MG PO TABS: 100.0000 mg | ORAL_TABLET | Freq: Three times a day (TID) | ORAL | Status: DC | PRN

## 2011-08-15 NOTE — Discharge Instructions (Signed)
Wash with soap and water daily, Pat dry, apply antibiotic ointment and then a nonstick dressing.  Return again in one week for recheck.

## 2011-08-15 NOTE — ED Notes (Signed)
Seen here 2 days ago for infection to rt middle finger.  Back today as directed for followup/recheck.  States taking antibiotic and pain med as prescribed

## 2011-08-15 NOTE — ED Provider Notes (Signed)
Chief Complaint  Patient presents with  . Follow-up    History of Present Illness:   [name] is a 56 year old female who returns for a followup following incision and drainage of a paronychia that resulted from an acrylic nail and partially torn off. This was I&D in the nail was removed. A pressure dressing was applied 2 days ago. She returns today for recheck. The finger is still painful. She is not running any fever.  Review of Systems:  Other than noted above, the patient denies any of the following symptoms: Systemic:  No fevers, chills, sweats, weight loss or gain, fatigue, or tiredness.  PMFSH:  Past medical history, family history, social history, meds, and allergies were reviewed.  Physical Exam:   Vital signs:  BP 206/69  Pulse 86  Temp(Src) 98.6 F (37 C) (Oral)  Resp 18  SpO2 98% General:  Alert and oriented.  In no distress.  Skin warm and dry. Extremities: The dressing was removed. The skin is somewhat macerated, but there is no swelling. It's still a little bit malodorous. There is no purulent drainage. No swelling on down more proximally on the finger. She's able to move all her joints well.  Labs:   Results for orders placed during the hospital encounter of 08/13/11  CULTURE, ROUTINE-ABSCESS      Component Value Range   Specimen Description ABSCESS FINGER     Special Requests RIGHT MIDDLE     Gram Stain       Value: ABUNDANT WBC PRESENT, PREDOMINANTLY PMN     NO SQUAMOUS EPITHELIAL CELLS SEEN     MODERATE GRAM POSITIVE COCCI IN PAIRS     FEW GRAM NEGATIVE RODS   Culture MODERATE PROTEUS MIRABILIS     Report Status PENDING       Radiology:  No results found.  Course in Urgent Care Center:   The dressing was removed, the wound is cleansed with saline, antibiotic ointment was applied and a Telfa dressing was reapplied. The patient was instructed in wound care and should return again in one week for recheck. She should finish up her antibiotics unless we call  and tell her otherwise.  Assessment:   Diagnoses that have been ruled out:  None  Diagnoses that are still under consideration:  None  Final diagnoses:  Paronychia    Plan:   1.  The following meds were prescribed:   New Prescriptions   No medications on file   2.  The patient was instructed in symptomatic care and handouts were given. 3.  The patient was told to return if becoming worse in any way, if no better in 3 or 4 days, and given some red flag symptoms that would indicate earlier return.  Follow up:  The patient was told to follow up here in one week for a recheck.      Reuben Likes, MD 08/15/11 [phone]

## 2011-08-16 LAB — CULTURE, ROUTINE-ABSCESS

## 2011-08-17 ENCOUNTER — Encounter (HOSPITAL_COMMUNITY): Payer: Self-pay | Admitting: *Deleted

## 2011-08-17 ENCOUNTER — Inpatient Hospital Stay (HOSPITAL_COMMUNITY)
Admission: EM | Admit: 2011-08-17 | Discharge: 2011-08-22 | DRG: 906 | Disposition: A | Discharge status: Home / Self Care | Payer: Medicaid Other | Source: Ambulatory Visit | Attending: Orthopedic Surgery | Admitting: Orthopedic Surgery

## 2011-08-17 DIAGNOSIS — K219 Gastro-esophageal reflux disease without esophagitis: Secondary | ICD-10-CM | POA: Diagnosis present

## 2011-08-17 DIAGNOSIS — Z6841 Body Mass Index (BMI) 40.0 and over, adult: Secondary | ICD-10-CM

## 2011-08-17 DIAGNOSIS — I2510 Atherosclerotic heart disease of native coronary artery without angina pectoris: Secondary | ICD-10-CM | POA: Diagnosis present

## 2011-08-17 DIAGNOSIS — M6789 Other specified disorders of synovium and tendon, multiple sites: Secondary | ICD-10-CM | POA: Diagnosis present

## 2011-08-17 DIAGNOSIS — L089 Local infection of the skin and subcutaneous tissue, unspecified: Secondary | ICD-10-CM

## 2011-08-17 DIAGNOSIS — Y998 Other external cause status: Secondary | ICD-10-CM

## 2011-08-17 DIAGNOSIS — I96 Gangrene, not elsewhere classified: Secondary | ICD-10-CM | POA: Diagnosis present

## 2011-08-17 DIAGNOSIS — J4489 Other specified chronic obstructive pulmonary disease: Secondary | ICD-10-CM | POA: Diagnosis present

## 2011-08-17 DIAGNOSIS — J449 Chronic obstructive pulmonary disease, unspecified: Secondary | ICD-10-CM | POA: Diagnosis present

## 2011-08-17 DIAGNOSIS — W268XXA Contact with other sharp object(s), not elsewhere classified, initial encounter: Secondary | ICD-10-CM | POA: Diagnosis present

## 2011-08-17 DIAGNOSIS — S61209A Unspecified open wound of unspecified finger without damage to nail, initial encounter: Principal | ICD-10-CM | POA: Diagnosis present

## 2011-08-17 DIAGNOSIS — Z9861 Coronary angioplasty status: Secondary | ICD-10-CM

## 2011-08-17 DIAGNOSIS — I10 Essential (primary) hypertension: Secondary | ICD-10-CM | POA: Diagnosis present

## 2011-08-17 LAB — BASIC METABOLIC PANEL
BUN: 12 mg/dL (ref 6–23)
Calcium: 9.5 mg/dL (ref 8.4–10.5)
GFR calc Af Amer: 86 mL/min — ABNORMAL LOW (ref >90)
GFR calc non Af Amer: 74 mL/min — ABNORMAL LOW (ref >90)
Glucose, Bld: 80 mg/dL (ref 70–99)
Potassium: 3.8 mEq/L (ref 3.5–5.1)
Sodium: 136 mEq/L (ref 135–145)

## 2011-08-17 LAB — DIFFERENTIAL
Basophils Relative: 1 % (ref 0–1)
Eosinophils Absolute: 0.1 10*3/uL (ref 0.0–0.7)
Eosinophils Relative: 2 % (ref 0–5)
Monocytes Relative: 7 % (ref 3–12)
Neutrophils Relative %: 57 % (ref 43–77)

## 2011-08-17 LAB — CBC
MCH: 26.5 pg (ref 26.0–34.0)
MCHC: 32.8 g/dL (ref 30.0–36.0)
MCV: 80.7 fL (ref 78.0–100.0)
Platelets: 184 10*3/uL (ref 150–400)

## 2011-08-17 MED ORDER — VANCOMYCIN HCL IN DEXTROSE 1-5 GM/200ML-% IV SOLN: 1000.0000 mg | Freq: Once | INTRAVENOUS | Status: DC

## 2011-08-17 MED ORDER — OXYCODONE-ACETAMINOPHEN 5-325 MG PO TABS: 1.0000 | ORAL_TABLET | Freq: Four times a day (QID) | ORAL | Status: AC | PRN

## 2011-08-17 MED ORDER — OXYCODONE-ACETAMINOPHEN 5-325 MG PO TABS
1.0000 | ORAL_TABLET | Freq: Once | ORAL | Status: AC
  Administered 2011-08-17: 1 via ORAL
  Filled 2011-08-17: qty 1

## 2011-08-17 MED ORDER — OXYCODONE-ACETAMINOPHEN 5-325 MG PO TABS
1.0000 | ORAL_TABLET | ORAL | Status: DC | PRN
  Administered 2011-08-17 – 2011-08-18 (×2): 1 via ORAL
  Filled 2011-08-17 (×2): qty 1

## 2011-08-17 MED ORDER — VANCOMYCIN HCL IN DEXTROSE 1-5 GM/200ML-% IV SOLN
1000.0000 mg | Freq: Two times a day (BID) | INTRAVENOUS | Status: DC
  Administered 2011-08-17 – 2011-08-18 (×2): 1000 mg via INTRAVENOUS
  Filled 2011-08-17 (×4): qty 200

## 2011-08-17 MED ORDER — DEXTROSE 5 % IV SOLN
1.0000 g | Freq: Once | INTRAVENOUS | Status: AC
  Administered 2011-08-17: 1 g via INTRAVENOUS
  Filled 2011-08-17: qty 10

## 2011-08-17 MED ORDER — CLINDAMYCIN HCL 150 MG PO CAPS: 300.0000 mg | ORAL_CAPSULE | Freq: Four times a day (QID) | ORAL | Status: AC

## 2011-08-17 MED ORDER — NICOTINE 21 MG/24HR TD PT24
21.0000 mg | MEDICATED_PATCH | Freq: Once | TRANSDERMAL | Status: AC
  Administered 2011-08-17: 21 mg via TRANSDERMAL
  Filled 2011-08-17: qty 1

## 2011-08-17 MED ORDER — ACETAMINOPHEN 325 MG PO TABS
650.0000 mg | ORAL_TABLET | ORAL | Status: DC | PRN
  Administered 2011-08-17: 650 mg via ORAL
  Filled 2011-08-17: qty 2

## 2011-08-17 MED ORDER — ONDANSETRON HCL 4 MG/2ML IJ SOLN
4.0000 mg | Freq: Four times a day (QID) | INTRAMUSCULAR | Status: DC | PRN
  Administered 2011-08-17: 4 mg via INTRAVENOUS
  Filled 2011-08-17: qty 2

## 2011-08-17 NOTE — ED Provider Notes (Signed)
History     CSN: [id number]  Arrival date & time 08/17/11  1402   First MD Initiated Contact with Patient 08/17/11 1511      Chief Complaint  Patient presents with  . Wound Infection    middle RT finger    (Consider location/radiation/quality/duration/timing/severity/associated sxs/prior treatment) HPI Comments: Patient presents with five-day history of right third finger infection. This started after she was cut by an acrylic nail. Patient was seen at urgent care and had drainage and her fingernail removed. She was started on Bactrim. Culture was performed. Microscopy showed gram-negative rods and gram positive cocci in pairs. Culture grew pansensitive Proteus mirabilis. Patient comes to the EEG today with worsening infection of her finger. She denies fever, nausea/vomiting, diarrhea. She states that the finger continues to ooze blood and pus on occasion. Patient does not have a history of diabetes. She has a history of high blood pressure.  Patient is a 56 y.o. female presenting with hand pain. The history is provided by the patient.  Hand Pain This is a new problem. The current episode started in the past 7 days. The problem occurs constantly. The problem has been gradually worsening. Pertinent negatives include no abdominal pain, chest pain, chills, coughing, fever, headaches, myalgias, nausea, rash, sore throat or vomiting. Exacerbated by: Palpation. Treatments tried: Bactrim. The treatment provided no relief.    Past Medical History  Diagnosis Date  . Hypertension     Past Surgical History  Procedure Date  . Abdominal hysterectomy   . Coronary stent placement     No family history on file.  History  Substance Use Topics  . Smoking status: Current Everyday Smoker  . Smokeless tobacco: Not on file  . Alcohol Use: Yes    OB History    Grav Para Term Preterm Abortions TAB SAB Ect Mult Living                  Review of Systems  Constitutional: Negative for fever and  chills.  HENT: Negative for sore throat and rhinorrhea.   Eyes: Negative for redness.  Respiratory: Negative for cough.   Cardiovascular: Negative for chest pain.  Gastrointestinal: Negative for nausea, vomiting, abdominal pain and diarrhea.  Genitourinary: Negative for dysuria.  Musculoskeletal: Negative for myalgias.  Skin: Positive for color change, pallor and wound. Negative for rash.  Neurological: Negative for headaches.    Allergies  Penicillins  Home Medications   Current Outpatient Rx  Name Route Sig Dispense Refill  . SULFAMETHOXAZOLE-TMP DS 800-160 MG PO TABS Oral Take 2 tablets by mouth 2 (two) times daily. For 10 days Started 3/6    . TRAMADOL HCL 50 MG PO TABS Oral Take 100 mg by mouth every 8 (eight) hours as needed. For pain.      BP 197/75  Pulse 64  Temp(Src) 98 F (36.7 C) (Oral)  Resp 20  SpO2 99%  Physical Exam  Nursing note and vitals reviewed. Constitutional: She is oriented to person, place, and time. She appears well-developed and well-nourished.  HENT:  Head: Normocephalic and atraumatic.  Eyes: Conjunctivae are normal. Right eye exhibits no discharge. Left eye exhibits no discharge.  Neck: Normal range of motion. Neck supple.  Cardiovascular: Normal rate, regular rhythm and normal heart sounds.   Pulmonary/Chest: Effort normal and breath sounds normal.  Abdominal: Soft. There is no tenderness.  Musculoskeletal: Normal range of motion. She exhibits edema and tenderness.       Right third finger is emaciated distal to  the PIP joint. Fingernail removed. There is blood oozing from the fingernail site. There is a healing incision on the end of the fingertip which appears well healing. Patient has full range of motion in finger. She does not hold the finger in flexion. She can tolerate passive extension of the finger. She has some mild tenderness to palpation along the flexor tendon sheath at the base of the finger however no fusiform swelling is noted.  Erythema extends from the proximal finger onto the dorsum of the hand. There is also mild swelling along the dorsum of the hand. Wrist is normal.  Neurological: She is alert and oriented to person, place, and time.  Skin: Skin is warm. There is erythema.  Psychiatric: She has a normal mood and affect.    ED Course  Procedures (including critical care time)  Labs Reviewed  CBC - Abnormal; Notable for the following:    RDW 17.0 (*)    All other components within normal limits  BASIC METABOLIC PANEL - Abnormal; Notable for the following:    GFR calc non Af Amer 74 (*)    GFR calc Af Amer 86 (*)    All other components within normal limits  DIFFERENTIAL   No results found.   1. Finger infection     3:48 PM Patient seen and examined. Work-up initiated. Medications ordered.   Vital signs reviewed and are as follows: Filed Vitals:   08/17/11 1413  BP: 197/75  Pulse: 64  Temp: 98 F (36.7 C)  Resp: 20   Patient seen and examined with Dr. Jeraldine Loots. Dr. Jeraldine Loots to call hand surgery for consult. IV Vanc ordered.   5:24 PM Dr. Jeraldine Loots has spoken with Dr. Melvyn Novas who will see patient in his office tomorrow at 1pm. Patient placed on cellulitis protocol.   Plan: Treat with IV vanc and IV rocephin given gram stain and culture report. Will give patient second dose of IV vanc in 12 hours. If patient is stable, she should be discharged to home on oral clindamycin (MRSA/strep coverage) and continue oral Bactrim (Proteus coverage), and follow-up with Dr. Melvyn Novas in office tomorrow. Patient and family verbalize understanding and are in agreement with this plan.  8:25 PM Hypertension noted earlier improved.   BP 119/79  Pulse 79  Temp(Src) 98.5 F (36.9 C) (Oral)  Resp 26  SpO2 95%  11:52 PM patient seen and examined. Exam is essentially unchanged from earlier. She continues to complain of pain and was given a Percocet. Handoff to Dr. Rennis Chris.   MDM  Finger infection. IV  antibiotics given in emergency department. Patient has orthopedic followup arranged with Dr. Melvyn Novas. Do not suspect flexor tenosynovitis.        Renne Crigler, [place] 08/17/11 [phone]

## 2011-08-17 NOTE — ED Notes (Signed)
PT reports she was seen and treated for RT middle finger infection at [facility] and sent home with PO antibx.  Infection worse and still bleeding

## 2011-08-18 ENCOUNTER — Other Ambulatory Visit: Payer: Self-pay

## 2011-08-18 ENCOUNTER — Inpatient Hospital Stay (HOSPITAL_COMMUNITY): Payer: Medicaid Other | Admitting: Anesthesiology

## 2011-08-18 ENCOUNTER — Encounter (HOSPITAL_COMMUNITY)
Admission: EM | Disposition: A | Discharge status: Home / Self Care | Payer: Self-pay | Source: Ambulatory Visit | Attending: Orthopedic Surgery

## 2011-08-18 ENCOUNTER — Encounter (HOSPITAL_COMMUNITY): Payer: Self-pay | Admitting: *Deleted

## 2011-08-18 ENCOUNTER — Encounter (HOSPITAL_COMMUNITY): Payer: Self-pay | Admitting: Anesthesiology

## 2011-08-18 HISTORY — PX: I&D EXTREMITY: SHX5045

## 2011-08-18 SURGERY — IRRIGATION AND DEBRIDEMENT EXTREMITY
Anesthesia: General | Site: Finger | Laterality: Right | Wound class: Dirty or Infected

## 2011-08-18 MED ORDER — FENTANYL CITRATE 0.05 MG/ML IJ SOLN
INTRAMUSCULAR | Status: DC | PRN
  Administered 2011-08-18 (×4): 25 ug via INTRAVENOUS

## 2011-08-18 MED ORDER — VITAMIN C 500 MG PO TABS: 1000.0000 mg | ORAL_TABLET | Freq: Every day | ORAL | Status: DC

## 2011-08-18 MED ORDER — METHOCARBAMOL 500 MG PO TABS
500.0000 mg | ORAL_TABLET | Freq: Four times a day (QID) | ORAL | Status: DC | PRN
  Administered 2011-08-20 – 2011-08-22 (×4): 500 mg via ORAL
  Filled 2011-08-18 (×4): qty 1

## 2011-08-18 MED ORDER — DOCUSATE SODIUM 100 MG PO CAPS: 100.0000 mg | ORAL_CAPSULE | Freq: Two times a day (BID) | ORAL | Status: DC

## 2011-08-18 MED ORDER — LACTATED RINGERS IV SOLN
INTRAVENOUS | Status: DC
  Administered 2011-08-18: 13:00:00 via INTRAVENOUS

## 2011-08-18 MED ORDER — HYDROMORPHONE HCL PF 1 MG/ML IJ SOLN: 0.2500 mg | INTRAMUSCULAR | Status: DC | PRN

## 2011-08-18 MED ORDER — VITAMIN C 500 MG PO TABS
1000.0000 mg | ORAL_TABLET | Freq: Every day | ORAL | Status: DC
  Administered 2011-08-18 – 2011-08-22 (×4): 1000 mg via ORAL
  Filled 2011-08-18 (×5): qty 2

## 2011-08-18 MED ORDER — ONDANSETRON HCL 4 MG/2ML IJ SOLN: 4.0000 mg | Freq: Four times a day (QID) | INTRAMUSCULAR | Status: DC | PRN

## 2011-08-18 MED ORDER — HYDROMORPHONE HCL PF 1 MG/ML IJ SOLN
0.2500 mg | INTRAMUSCULAR | Status: DC | PRN
  Administered 2011-08-18 (×4): 0.5 mg via INTRAVENOUS
  Filled 2011-08-18: qty 1

## 2011-08-18 MED ORDER — ONDANSETRON HCL 4 MG/2ML IJ SOLN
INTRAMUSCULAR | Status: DC | PRN
  Administered 2011-08-18: 4 mg via INTRAVENOUS

## 2011-08-18 MED ORDER — DIPHENHYDRAMINE HCL 25 MG PO CAPS: 25.0000 mg | ORAL_CAPSULE | Freq: Four times a day (QID) | ORAL | Status: DC | PRN

## 2011-08-18 MED ORDER — LORAZEPAM 2 MG/ML IJ SOLN: 1.0000 mg | Freq: Once | INTRAMUSCULAR | Status: AC | PRN

## 2011-08-18 MED ORDER — KCL IN DEXTROSE-NACL 20-5-0.45 MEQ/L-%-% IV SOLN
INTRAVENOUS | Status: DC
  Administered 2011-08-18 – 2011-08-19 (×3): via INTRAVENOUS
  Filled 2011-08-18 (×5): qty 1000

## 2011-08-18 MED ORDER — HYDROCODONE-ACETAMINOPHEN 5-325 MG PO TABS: 1.0000 | ORAL_TABLET | ORAL | Status: DC | PRN

## 2011-08-18 MED ORDER — LACTATED RINGERS IV SOLN
INTRAVENOUS | Status: DC | PRN
  Administered 2011-08-18: 18:00:00 via INTRAVENOUS

## 2011-08-18 MED ORDER — KCL IN DEXTROSE-NACL 20-5-0.45 MEQ/L-%-% IV SOLN
INTRAVENOUS | Status: DC
  Administered 2011-08-18: 20:00:00 via INTRAVENOUS
  Filled 2011-08-18: qty 1000

## 2011-08-18 MED ORDER — DOCUSATE SODIUM 100 MG PO CAPS
100.0000 mg | ORAL_CAPSULE | Freq: Two times a day (BID) | ORAL | Status: DC
  Administered 2011-08-18 – 2011-08-21 (×5): 100 mg via ORAL
  Filled 2011-08-18 (×9): qty 1

## 2011-08-18 MED ORDER — SODIUM CHLORIDE 0.9 % IR SOLN
Status: DC | PRN
  Administered 2011-08-18: 3000 mL

## 2011-08-18 MED ORDER — MIDAZOLAM HCL 5 MG/5ML IJ SOLN
INTRAMUSCULAR | Status: DC | PRN
  Administered 2011-08-18: 2 mg via INTRAVENOUS

## 2011-08-18 MED ORDER — KCL IN DEXTROSE-NACL 20-5-0.45 MEQ/L-%-% IV SOLN
INTRAVENOUS | Status: DC
  Filled 2011-08-18 (×3): qty 1000

## 2011-08-18 MED ORDER — METHOCARBAMOL 500 MG PO TABS: 500.0000 mg | ORAL_TABLET | Freq: Four times a day (QID) | ORAL | Status: DC | PRN

## 2011-08-18 MED ORDER — METHOCARBAMOL 100 MG/ML IJ SOLN
500.0000 mg | Freq: Four times a day (QID) | INTRAVENOUS | Status: DC | PRN
  Filled 2011-08-18: qty 5

## 2011-08-18 MED ORDER — ADULT MULTIVITAMIN W/MINERALS CH
1.0000 | ORAL_TABLET | Freq: Every day | ORAL | Status: DC
  Administered 2011-08-18 – 2011-08-22 (×4): 1 via ORAL
  Filled 2011-08-18 (×5): qty 1

## 2011-08-18 MED ORDER — ONDANSETRON HCL 4 MG PO TABS: 4.0000 mg | ORAL_TABLET | Freq: Four times a day (QID) | ORAL | Status: DC | PRN

## 2011-08-18 MED ORDER — VANCOMYCIN HCL 1000 MG IV SOLR
1250.0000 mg | Freq: Two times a day (BID) | INTRAVENOUS | Status: DC
  Administered 2011-08-18 – 2011-08-22 (×8): 1250 mg via INTRAVENOUS
  Filled 2011-08-18 (×9): qty 1250

## 2011-08-18 MED ORDER — HYDROMORPHONE HCL PF 1 MG/ML IJ SOLN: 0.5000 mg | INTRAMUSCULAR | Status: DC | PRN

## 2011-08-18 MED ORDER — OXYCODONE HCL 5 MG PO TABS
5.0000 mg | ORAL_TABLET | ORAL | Status: DC | PRN
  Administered 2011-08-19 – 2011-08-22 (×12): 10 mg via ORAL
  Filled 2011-08-18 (×12): qty 2

## 2011-08-18 MED ORDER — OXYCODONE-ACETAMINOPHEN 5-325 MG PO TABS
1.0000 | ORAL_TABLET | ORAL | Status: DC | PRN
  Filled 2011-08-18: qty 2

## 2011-08-18 MED ORDER — LIDOCAINE HCL (CARDIAC) 20 MG/ML IV SOLN
INTRAVENOUS | Status: DC | PRN
  Administered 2011-08-18: 60 mg via INTRAVENOUS

## 2011-08-18 MED ORDER — HYDROMORPHONE HCL PF 1 MG/ML IJ SOLN
0.5000 mg | INTRAMUSCULAR | Status: DC | PRN
  Administered 2011-08-18 – 2011-08-21 (×6): 1 mg via INTRAVENOUS
  Filled 2011-08-18 (×5): qty 1

## 2011-08-18 MED ORDER — PROPOFOL 10 MG/ML IV EMUL
INTRAVENOUS | Status: DC | PRN
  Administered 2011-08-18: 50 mg via INTRAVENOUS
  Administered 2011-08-18: 200 mg via INTRAVENOUS

## 2011-08-18 MED ORDER — ZOLPIDEM TARTRATE 5 MG PO TABS: 5.0000 mg | ORAL_TABLET | Freq: Every evening | ORAL | Status: DC | PRN

## 2011-08-18 MED ORDER — CHLORHEXIDINE GLUCONATE 4 % EX LIQD
60.0000 mL | Freq: Once | CUTANEOUS | Status: AC
  Administered 2011-08-18: 4 via TOPICAL
  Filled 2011-08-18: qty 60

## 2011-08-18 MED ORDER — HYDROMORPHONE HCL PF 1 MG/ML IJ SOLN
INTRAMUSCULAR | Status: AC
  Administered 2011-08-18: 1 mg via INTRAVENOUS
  Filled 2011-08-18: qty 1

## 2011-08-18 SURGICAL SUPPLY — 56 items
BANDAGE CONFORM 2  STR LF (GAUZE/BANDAGES/DRESSINGS) IMPLANT
BANDAGE CONFORM 2X5YD N/S (GAUZE/BANDAGES/DRESSINGS) ×2 IMPLANT
BANDAGE ELASTIC 3 VELCRO ST LF (GAUZE/BANDAGES/DRESSINGS) ×2 IMPLANT
BANDAGE ELASTIC 4 VELCRO ST LF (GAUZE/BANDAGES/DRESSINGS) IMPLANT
BANDAGE GAUZE ELAST BULKY 4 IN (GAUZE/BANDAGES/DRESSINGS) ×2 IMPLANT
BNDG COHESIVE 1X5 TAN STRL LF (GAUZE/BANDAGES/DRESSINGS) ×2 IMPLANT
BNDG ESMARK 4X9 LF (GAUZE/BANDAGES/DRESSINGS) IMPLANT
CLOTH BEACON ORANGE TIMEOUT ST (SAFETY) ×2 IMPLANT
CORDS BIPOLAR (ELECTRODE) ×2 IMPLANT
COVER SURGICAL LIGHT HANDLE (MISCELLANEOUS) ×2 IMPLANT
CUFF TOURNIQUET SINGLE 18IN (TOURNIQUET CUFF) ×2 IMPLANT
CUFF TOURNIQUET SINGLE 24IN (TOURNIQUET CUFF) IMPLANT
DRAIN PENROSE 1/4X12 LTX STRL (WOUND CARE) IMPLANT
DRAPE SURG 17X23 STRL (DRAPES) ×2 IMPLANT
DRSG ADAPTIC 3X8 NADH LF (GAUZE/BANDAGES/DRESSINGS) ×2 IMPLANT
DRSG EMULSION OIL 3X3 NADH (GAUZE/BANDAGES/DRESSINGS) ×2 IMPLANT
ELECT REM PT RETURN 9FT ADLT (ELECTROSURGICAL)
ELECTRODE REM PT RTRN 9FT ADLT (ELECTROSURGICAL) IMPLANT
GAUZE SPONGE 2X2 8PLY STRL LF (GAUZE/BANDAGES/DRESSINGS) ×1 IMPLANT
GAUZE XEROFORM 1X8 LF (GAUZE/BANDAGES/DRESSINGS) ×2 IMPLANT
GAUZE XEROFORM 5X9 LF (GAUZE/BANDAGES/DRESSINGS) IMPLANT
GLOVE BIOGEL PI IND STRL 8.5 (GLOVE) ×1 IMPLANT
GLOVE BIOGEL PI INDICATOR 8.5 (GLOVE) ×1
GLOVE SURG ORTHO 8.0 STRL STRW (GLOVE) ×2 IMPLANT
GOWN PREVENTION PLUS XLARGE (GOWN DISPOSABLE) ×2 IMPLANT
GOWN STRL NON-REIN LRG LVL3 (GOWN DISPOSABLE) ×2 IMPLANT
HANDPIECE INTERPULSE COAX TIP (DISPOSABLE)
KIT BASIN OR (CUSTOM PROCEDURE TRAY) ×2 IMPLANT
KIT ROOM TURNOVER OR (KITS) ×2 IMPLANT
MANIFOLD NEPTUNE II (INSTRUMENTS) ×2 IMPLANT
NEEDLE HYPO 25GX1X1/2 BEV (NEEDLE) IMPLANT
NS IRRIG 1000ML POUR BTL (IV SOLUTION) ×2 IMPLANT
PACK ORTHO EXTREMITY (CUSTOM PROCEDURE TRAY) ×2 IMPLANT
PAD ARMBOARD 7.5X6 YLW CONV (MISCELLANEOUS) ×4 IMPLANT
PAD CAST 4YDX4 CTTN HI CHSV (CAST SUPPLIES) IMPLANT
PADDING CAST COTTON 4X4 STRL (CAST SUPPLIES)
SET HNDPC FAN SPRY TIP SCT (DISPOSABLE) IMPLANT
SOAP 2 % CHG 4 OZ (WOUND CARE) ×2 IMPLANT
SPLINT FINGER 5/8X3.25 (SOFTGOODS) ×1 IMPLANT
SPLINT FINGER FOAM 3 9119 05 (SOFTGOODS) ×2
SPONGE GAUZE 2X2 STER 10/PKG (GAUZE/BANDAGES/DRESSINGS) ×1
SPONGE GAUZE 4X4 12PLY (GAUZE/BANDAGES/DRESSINGS) ×2 IMPLANT
SPONGE LAP 18X18 X RAY DECT (DISPOSABLE) IMPLANT
SPONGE LAP 4X18 X RAY DECT (DISPOSABLE) IMPLANT
SUCTION FRAZIER TIP 10 FR DISP (SUCTIONS) ×2 IMPLANT
SUT ETHILON 4 0 PS 2 18 (SUTURE) ×2 IMPLANT
SUT ETHILON 5 0 P 3 18 (SUTURE)
SUT NYLON ETHILON 5-0 P-3 1X18 (SUTURE) IMPLANT
SYR CONTROL 10ML LL (SYRINGE) IMPLANT
TOWEL OR 17X24 6PK STRL BLUE (TOWEL DISPOSABLE) ×2 IMPLANT
TOWEL OR 17X26 10 PK STRL BLUE (TOWEL DISPOSABLE) ×2 IMPLANT
TUBE ANAEROBIC SPECIMEN COL (MISCELLANEOUS) IMPLANT
TUBE CONNECTING 12X1/4 (SUCTIONS) ×2 IMPLANT
UNDERPAD 30X30 INCONTINENT (UNDERPADS AND DIAPERS) ×2 IMPLANT
WATER STERILE IRR 1000ML POUR (IV SOLUTION) IMPLANT
YANKAUER SUCT BULB TIP NO VENT (SUCTIONS) ×2 IMPLANT

## 2011-08-18 NOTE — Progress Notes (Signed)
Observation review is complete. 

## 2011-08-18 NOTE — H&P (Signed)
[name] is an 56 y.o. female.   Chief Complaint: Right long finger pain and swelling HPI: Worsening pain and swelling in right long finger Pt had i/d in urgent care and pt with worsening pain and swelling Pt has been receiving iv vanc in cdu I was consulted for management of his long finger   Past Medical History  Diagnosis Date  . Hypertension     Past Surgical History  Procedure Date  . Abdominal hysterectomy   . Coronary stent placement     History reviewed. No pertinent family history. Social History:  reports that she has been smoking.  She does not have any smokeless tobacco history on file. She reports that she drinks alcohol. She reports that she does not use illicit drugs.  Allergies:  Allergies  Allergen Reactions  . Penicillins Itching    Medications Prior to Admission  Medication Dose Route Frequency Provider Last Rate Last Dose  . acetaminophen (TYLENOL) tablet 650 mg  650 mg Oral Q4H PRN Renne Crigler, PA   650 mg at 08/17/11 1808  . cefTRIAXone (ROCEPHIN) 1 g in dextrose 5 % 50 mL IVPB  1 g Intravenous Once Renne Crigler, PA   1 g at 08/17/11 1807  . chlorhexidine (HIBICLENS) 4 % liquid 4 application  60 mL Topical Once Sharma Covert, MD   4 application at 08/18/11 1035  . dextrose 5 % and 0.45 % NaCl with KCl 20 mEq/L infusion   Intravenous Continuous Sharma Covert, MD      . nicotine (NICODERM CQ - dosed in mg/24 hours) patch 21 mg  21 mg Transdermal Once Renne Crigler, [place]   21 mg at 08/17/11 1829  . ondansetron (ZOFRAN) injection 4 mg  4 mg Intravenous Q6H PRN Renne Crigler, PA   4 mg at 08/17/11 1807  . oxyCODONE-acetaminophen (PERCOCET) 5-325 MG per tablet 1 tablet  1 tablet Oral Once Renne Crigler, [place]   1 tablet at 08/17/11 1829  . oxyCODONE-acetaminophen (PERCOCET) 5-325 MG per tablet 1 tablet  1 tablet Oral Q4H PRN Renne Crigler, PA   1 tablet at 08/18/11 0338  . vancomycin (VANCOCIN) IVPB 1000 mg/200 mL premix  1,000 mg Intravenous Q12H Renne Crigler, PA   1,000 mg at 08/18/11 0552  . DISCONTD: vancomycin (VANCOCIN) IVPB 1000 mg/200 mL premix  1,000 mg Intravenous Once Renne Crigler, PA       Medications Prior to Admission  Medication Sig Dispense Refill  . cyclobenzaprine (FLEXERIL) 10 MG tablet Take 10 mg by mouth 3 (three) times daily as needed. For muscle spasms      . HYDROcodone-acetaminophen (VICODIN ES) 7.5-750 MG per tablet Take 1 tablet by mouth 4 (four) times daily as needed. For pain      . sulfamethoxazole-trimethoprim (BACTRIM DS) 800-160 MG per tablet Take 2 tablets by mouth 2 (two) times daily. For 10 days Started 3/6      . traMADol (ULTRAM) 50 MG tablet Take 100 mg by mouth every 8 (eight) hours as needed. For pain.        Results for orders placed during the hospital encounter of 08/17/11 (from the past 48 hour(s))  CBC     Status: Abnormal   Collection Time   08/17/11  4:08 PM      Component Value Range Comment   WBC 5.6  4.0 - 10.5 (K/uL)    RBC 4.98  3.87 - 5.11 (MIL/uL)    Hemoglobin 13.2  12.0 - 15.0 (g/dL)  HCT 40.2  36.0 - 46.0 (%)    MCV 80.7  78.0 - 100.0 (fL)    MCH 26.5  26.0 - 34.0 (pg)    MCHC 32.8  30.0 - 36.0 (g/dL)    RDW 16.1 (*) 09.6 - 15.5 (%)    Platelets 184  150 - 400 (K/uL)   DIFFERENTIAL     Status: Normal   Collection Time   08/17/11  4:08 PM      Component Value Range Comment   Neutrophils Relative 57  43 - 77 (%)    Neutro Abs 3.2  1.7 - 7.7 (K/uL)    Lymphocytes Relative 33  12 - 46 (%)    Lymphs Abs 1.9  0.7 - 4.0 (K/uL)    Monocytes Relative 7  3 - 12 (%)    Monocytes Absolute 0.4  0.1 - 1.0 (K/uL)    Eosinophils Relative 2  0 - 5 (%)    Eosinophils Absolute 0.1  0.0 - 0.7 (K/uL)    Basophils Relative 1  0 - 1 (%)    Basophils Absolute 0.0  0.0 - 0.1 (K/uL)   BASIC METABOLIC PANEL     Status: Abnormal   Collection Time   08/17/11  4:08 PM      Component Value Range Comment   Sodium 136  135 - 145 (mEq/L)    Potassium 3.8  3.5 - 5.1 (mEq/L)    Chloride 104  96 -  112 (mEq/L)    CO2 24  19 - 32 (mEq/L)    Glucose, Bld 80  70 - 99 (mg/dL)    BUN 12  6 - 23 (mg/dL)    Creatinine, Ser 0.45  0.50 - 1.10 (mg/dL)    Calcium 9.5  8.4 - 10.5 (mg/dL)    GFR calc non Af Amer 74 (*) >90 (mL/min)    GFR calc Af Amer 86 (*) >90 (mL/min)    No results found.  No recent illnesses or hospitalizations  Blood pressure 175/82, pulse 60, temperature 98 F (36.7 C), temperature source Oral, resp. rate 20, SpO2 100.00%. General Appearance:  Alert, cooperative, no distress, appears stated age  Head:  Normocephalic, without obvious abnormality, atraumatic  Eyes:  Pupils equal, conjunctiva/corneas clear,         Throat: Lips, mucosa, and tongue normal; teeth and gums normal  Neck: No visible masses     Lungs:   respirations unlabored  Chest Wall:  No tenderness or deformity  Heart:  Regular rate and rhythm,  Abdomen:   Soft, non-tender,         Extremities: Right long finger: sausage appearing digit, limited mobility [place] incision distally.  Finger very swollen. Blistering of skin dorsally ttp over flexor sheath. No involvement of index/ring/small Finger tips warm well perfused.  Pulses: 2+ and symmetric  Skin: Skin color, texture, turgor normal, no rashes or lesions     Neurologic: Normal    Assessment/Plan Right long finger infection likely purulent flexor tenosynovitis  Right long finger incision and drainage of flexor sheath  R/B/A DISCUSSED WITH PT IN ED.  PT VOICED UNDERSTANDING OF PLAN CONSENT SIGNED DAY OF SURGERY PT SEEN AND EXAMINED PRIOR TO OPERATIVE PROCEDURE/DAY OF SURGERY SITE MARKED. QUESTIONS ANSWERED WILL REMAIN AN INPATIENT FOLLOWING SURGERY  Sharma Covert 08/18/2011, 11:22 AM

## 2011-08-18 NOTE — ED Notes (Signed)
PT SOAKING IN HIBICLENS.

## 2011-08-18 NOTE — Anesthesia Preprocedure Evaluation (Addendum)
Anesthesia Evaluation  Patient identified by MRN, date of birth, ID band Patient awake    Reviewed: Allergy & Precautions, H&P , NPO status , Patient's Chart, lab work & pertinent test results, reviewed documented beta blocker date and time   History of Anesthesia Complications (+) AWARENESS UNDER ANESTHESIA  Airway Mallampati: III TM Distance: >3 FB Neck ROM: full    Dental   Pulmonary COPDCurrent Smoker,  + rhonchi         Cardiovascular hypertension, + CAD and + Cardiac Stents     Neuro/Psych    GI/Hepatic GERD-  ,  Endo/Other  Morbid obesity  Renal/GU      Musculoskeletal   Abdominal (+) + obese,   Peds  Hematology   Anesthesia Other Findings   Reproductive/Obstetrics                         Anesthesia Physical Anesthesia Plan  ASA: III  Anesthesia Plan: General   Post-op Pain Management:    Induction: Intravenous  Airway Management Planned: LMA  Additional Equipment:   Intra-op Plan:   Post-operative Plan: Extubation in OR  Informed Consent: I have reviewed the patients History and Physical, chart, labs and discussed the procedure including the risks, benefits and alternatives for the proposed anesthesia with the patient or authorized representative who has indicated his/her understanding and acceptance.     Plan Discussed with: CRNA and Surgeon  Anesthesia Plan Comments:         Anesthesia Quick Evaluation

## 2011-08-18 NOTE — Anesthesia Procedure Notes (Addendum)
Procedure Name: LMA Insertion Date/Time: 08/18/2011 6:38 PM Performed by: Jerilee Hoh Pre-anesthesia Checklist: Patient identified, Emergency Drugs available, Suction available and Patient being monitored Patient Re-evaluated:Patient Re-evaluated prior to inductionOxygen Delivery Method: Circle system utilized Preoxygenation: Pre-oxygenation with 100% oxygen Intubation Type: IV induction Ventilation: Mask ventilation without difficulty LMA: LMA with gastric port inserted LMA Size: 4.0 Number of attempts: 1 Placement Confirmation: ETT inserted through vocal cords under direct vision,  positive ETCO2 and breath sounds checked- equal and bilateral Tube secured with: Tape Dental Injury: Teeth and Oropharynx as per pre-operative assessment

## 2011-08-18 NOTE — ED Notes (Signed)
Abscess culture R middle finger: Mod. Proteus Mirabilis. Pt. Adequately treated with Bactrim DS.  [name] 08/18/2011

## 2011-08-18 NOTE — Anesthesia Postprocedure Evaluation (Signed)
  Anesthesia Post-op Note  Patient: [name]  Procedure(s) Performed: Procedure(s) (LRB): IRRIGATION AND DEBRIDEMENT EXTREMITY (Right)  Patient Location: PACU  Anesthesia Type: General  Level of Consciousness: awake  Airway and Oxygen Therapy: Patient Spontanous Breathing  Post-op Pain: mild  Post-op Assessment: Post-op Vital signs reviewed, Patient's Cardiovascular Status Stable, Respiratory Function Stable, Patent Airway and Pain level controlled  Post-op Vital Signs: stable  Complications: No apparent anesthesia complications

## 2011-08-18 NOTE — Preoperative (Signed)
Beta Blockers   Reason not to administer Beta Blockers:Not Applicable 

## 2011-08-18 NOTE — Transfer of Care (Signed)
Immediate Anesthesia Transfer of Care Note  Patient: [name]  Procedure(s) Performed: Procedure(s) (LRB): IRRIGATION AND DEBRIDEMENT EXTREMITY (Right)  Patient Location: PACU  Anesthesia Type: General  Level of Consciousness: awake, alert , oriented and patient cooperative  Airway & Oxygen Therapy: Patient Spontanous Breathing and Patient connected to face mask oxygen  Post-op Assessment: Report given to PACU RN, Post -op Vital signs reviewed and stable and Patient moving all extremities  Post vital signs: Reviewed and stable  Complications: No apparent anesthesia complications

## 2011-08-18 NOTE — Progress Notes (Signed)
ANTIBIOTIC CONSULT NOTE - INITIAL  Pharmacy Consult for Vancomycin Indication: finger infection  Allergies  Allergen Reactions  . Penicillins Itching    Patient Measurements:  Weight:   Vital Signs: Temp: 97.7 F (36.5 C) (03/11 2058) Temp src: Oral (03/11 2058) BP: 185/85 mmHg (03/11 2058) Pulse Rate: 85  (03/11 2058) Intake/Output from previous day:   Intake/Output from this shift:    Labs:  Basename 08/17/11 1608  WBC 5.6  HGB 13.2  PLT 184  LABCREA --  CREATININE 0.86   CrCl is unknown because there is no height on file for the current visit. No results found for this basename: VANCOTROUGH:2,VANCOPEAK:2,VANCORANDOM:2,GENTTROUGH:2,GENTPEAK:2,GENTRANDOM:2,TOBRATROUGH:2,TOBRAPEAK:2,TOBRARND:2,AMIKACINPEAK:2,AMIKACINTROU:2,AMIKACIN:2, in the last 72 hours   Microbiology: Recent Results (from the past 720 hour(s))  CULTURE, ROUTINE-ABSCESS     Status: Normal   Collection Time   08/13/11  5:26 PM      Component Value Range Status Comment   Specimen Description ABSCESS FINGER   Final    Special Requests RIGHT MIDDLE   Final    Gram Stain     Final    Value: ABUNDANT WBC PRESENT, PREDOMINANTLY PMN     NO SQUAMOUS EPITHELIAL CELLS SEEN     MODERATE GRAM POSITIVE COCCI IN PAIRS     FEW GRAM NEGATIVE RODS   Culture MODERATE PROTEUS MIRABILIS   Final    Report Status 08/16/2011 FINAL   Final    Organism ID, Bacteria PROTEUS MIRABILIS   Final     Medical History: Past Medical History  Diagnosis Date  . Hypertension     Medications:  Prescriptions prior to admission  Medication Sig Dispense Refill  . cyclobenzaprine (FLEXERIL) 10 MG tablet Take 10 mg by mouth 3 (three) times daily as needed. For muscle spasms      . HYDROcodone-acetaminophen (VICODIN ES) 7.5-750 MG per tablet Take 1 tablet by mouth 4 (four) times daily as needed. For pain      . sulfamethoxazole-trimethoprim (BACTRIM DS) 800-160 MG per tablet Take 2 tablets by mouth 2 (two) times daily. For 10  days Started 3/6      . traMADol (ULTRAM) 50 MG tablet Take 100 mg by mouth every 8 (eight) hours as needed. For pain.       Assessment: 55yof to continue Vancomycin for finger infection s/p I&D. Patient has received 2 doses of Vancomycin in the ED (3/10 @ 1800, 3/11 @ 0600).  - Wt: 124kg - SCr 0.86, CrCl (normalized) ~83 ml/min - PCN allergy  Goal of Therapy:  Vancomycin Trough 10-20  Plan:  1. Vancomycin 1.25g IV q12h 2. Monitor renal function, cultures, clinical course and adjust as indicated  Cleon Dew [phone] 08/18/2011,9:19 PM

## 2011-08-18 NOTE — Brief Op Note (Signed)
08/17/2011 - 08/18/2011  7:21 PM  PATIENT:  [name]  56 y.o. female  PRE-OPERATIVE DIAGNOSIS:  infected Right Long Finger  POST-OPERATIVE DIAGNOSIS:  same  PROCEDURE:  Procedure(s) (LRB): IRRIGATION AND DEBRIDEMENT EXTREMITY (Right)  SURGEON:  Surgeon(s) and Role:    * Sharma Covert, MD - Primary  PHYSICIAN ASSISTANT:   ASSISTANTS: none   ANESTHESIA:   general  EBL:     BLOOD ADMINISTERED:none  DRAINS: none   LOCAL MEDICATIONS USED:  NONE  SPECIMEN:  No Specimen  DISPOSITION OF SPECIMEN:  N/A  COUNTS:  YES  TOURNIQUET:  * Missing tourniquet times found for documented tourniquets in log:  28626 *  DICTATION: .Other Dictation: Dictation Number [id number]  PLAN OF CARE: Admit to inpatient   PATIENT DISPOSITION:  PACU - hemodynamically stable.   Delay start of Pharmacological VTE agent (>24hrs) due to surgical blood loss or risk of bleeding: not applicable

## 2011-08-19 NOTE — Progress Notes (Signed)
@  12:45 while I was standing in proximity of room 5014 (door closed), secretary alerted me that pt called to inform staff she fell.  I entered room immediately, finding pt standing at foot of bed pulling IV pole attempting to make it to the bathroom.  Pt stated she was trying to get to bathroom and fell, then pulled herself up. Also stated, she didn't want to bother staff so she didn't use call bell.  Instructed pt to sit in near by chair. Call bell located on bed within reach, pt denies any injury other than pain in right middle finger, VSS, no injuries noted on inspection. BS commode brought to chair side, pt assisted to commode with 2 person assist.  Afterwards, pt transferred back to bed with 2 person assist (usual activity). Several staff members responded to call almost immediately after I entered room.

## 2011-08-19 NOTE — ED Provider Notes (Signed)
Medical screening examination/treatment/procedure(s) were conducted as a shared visit with non-physician practitioner(s) and myself.  I personally evaluated the patient during the encounter On my exam the patient's finger is edematous, erythematous and the previously incised region is not actively draining pus or blood.  Given the concern for deep space infection I spoke with our Hand Surgery Consultant who advised IV ABX and D/C w outpatient f/u.  He offered a set appointment time the next day.  The patient remained in the ED for two doses of ABX (under CDU cellulitis protocol.)  Gerhard Munch, MD 08/19/11 [phone]

## 2011-08-19 NOTE — Evaluation (Signed)
Occupational Therapy Evaluation Patient Details Name: [name] MRN: [id number] DOB: 03-02-1956 Today's Date: 08/19/2011  Problem List:  Patient Active Problem List  Diagnoses  . OBESITY  . TOBACCO ABUSE  . HYPERTENSION  . CARDIOMYOPATHY  . COPD  . GERD  . DEGENERATIVE JOINT DISEASE    Past Medical History:  Past Medical History  Diagnosis Date  . Hypertension    Past Surgical History:  Past Surgical History  Procedure Date  . Abdominal hysterectomy   . Coronary stent placement     OT Assessment/Plan/Recommendation OT Assessment Clinical Impression Statement: Pt admitted for Rt. 3rd digit I&D and presents with decreased I with ADL and edema control. OT Recommendation/Assessment: Patient will need skilled OT in the acute care venue OT Problem List: Decreased knowledge of precautions;Increased edema;Impaired UE functional use;Pain OT Therapy Diagnosis : Generalized weakness;Acute pain OT Plan OT Frequency: Min 2X/week OT Treatment/Interventions: Self-care/ADL training;DME and/or AE instruction;Therapeutic activities;Patient/family education (edema control) OT Recommendation Follow Up Recommendations: No OT follow up Equipment Recommended: None recommended by OT Individuals Consulted Consulted and Agree with Results and Recommendations: Patient OT Goals Acute Rehab OT Goals OT Goal Formulation: With patient Time For Goal Achievement: 7 days ADL Goals Additional ADL Goal #1: Pt will verbalize/demonstrate edema control techniques for RUE. ADL Goal: Additional Goal #1 - Progress: Goal set today Additional ADL Goal #2: Pt. will verbalize/demonstrate ADL adaptations for RUE. ADL Goal: Additional Goal #2 - Progress: Goal set today  OT Evaluation Precautions/Restrictions  Precautions Precautions: Fall Restrictions Weight Bearing Restrictions: No Prior Functioning Home Living Lives With: Daughter Home Layout: One level Bathroom Shower/Tub: Tub/shower  unit;Curtain Firefighter: Standard Home Adaptive Equipment: Bedside commode/3-in-1;Shower chair without back Prior Function Driving: No Vocation: On disability Comments: Pt states she receives assist from dtr for bathing, LB dsg and all homemaking activities.  ADL ADL Eating/Feeding: Simulated;Minimal assistance Eating/Feeding Details (indicate cue type and reason): assist to manipulate silverware, cut food and open containers Where Assessed - Eating/Feeding: Edge of bed Upper Body Dressing: Simulated;Minimal assistance Upper Body Dressing Details (indicate cue type and reason): educated pt to thread RUE first. pt states she will be wearning dresses/house gowns at home Where Assessed - Upper Body Dressing: Sitting, bed Ambulation Related to ADLs: NT this session ADL Comments: Limited eval secondary to pt just had gotten back into bed after receiving hydro. Pt states her pain is increasing (RN notified) and would like to rest at this time. Educated pt on edema control and ADL adaptations. Anticipate pt is at or near baseline functioning for ambulation. Dtr is available for all needed assist once d/c home.  Vision/Perception  Vision - History Patient Visual Report: No change from baseline Cognition Cognition Orientation Level: Oriented X4 Sensation/Coordination Coordination Gross Motor Movements are Fluid and Coordinated: Yes Fine Motor Movements are Fluid and Coordinated: No (LUE) Extremity Assessment RUE Assessment RUE Assessment: Within Functional Limits (except hand (NT secondary to I&D and dressing)) LUE Assessment LUE Assessment: Within Functional Limits Mobility  Bed Mobility Bed Mobility: No Transfers Transfers: No Exercises Rt. finger/wrist flexion/extension to promote circulation/decrease edema/maintain ROM End of Session OT - End of Session Activity Tolerance: Patient limited by pain Patient left: in bed;with call bell in reach (RN in room) General Behavior  During Session: [facility] for tasks performed Cognition: [facility] for tasks performed   Chetan Mehring 08/19/2011, 9:53 AM

## 2011-08-19 NOTE — Op Note (Signed)
[name], [name]              ACCOUNT NO.:  [id number]  MEDICAL RECORD NO.:  [id number]  LOCATION:  5014                         FACILITY:  [facility]  PHYSICIAN:  Madelynn Done, MD  DATE OF BIRTH:  1956/03/03  DATE OF PROCEDURE:  08/18/2011 DATE OF DISCHARGE:                              OPERATIVE REPORT   PREOPERATIVE DIAGNOSIS:  Right long finger flexor sheath infection.  POSTOPERATIVE DIAGNOSIS:  Right long finger flexor sheath infection.  ATTENDING PHYSICIAN:  Madelynn Done, MD, who scrubbed and present for the entire procedure.  ASSISTANT SURGEON:  None.  ANESTHESIA:  General via LMA.  PROCEDURE: 1. Right long finger flexor sheath exploration and drainage. 2. Debridement of skin and subcutaneous tissue associated with     infection and excisional debridement.  SURGICAL INDICATION:  [name] is a 56 year old female who had a worsening infection in her right long finger.  The patient was seen and evaluated in the emergency department and given the severity of her infection, we recommended that she undergo the above procedure.  Risks, benefits, and alternatives were discussed in detail with the patient and signed informed consent was obtained.  Risks include, but not limited to bleeding, infection, damage to nearby nerves, arteries, or tendons, loss of motion of the elbow, wrist and digits, loss of the finger, and need for further surgical intervention.  DESCRIPTION OF PROCEDURE:  The patient was properly identified in the preop holding area and a mark with a permanent marker made on the right long finger to indicate the correct operative site.  The patient was then brought back to the operating room, placed supine on the anesthesia room table, and general anesthesia was administered.  The patient tolerated this well.  Well-padded tourniquet was then placed on the right brachium, and the right forearm was sealed with a 1000 drape.  The right upper extremity  was then prepped and draped in normal sterile fashion.  Time-out was called, correct side was identified, and the procedure then begun.  Attention was then turned to the right long finger where excisional debridement was then carried out of the necrotic skin of subcutaneous tissue.  Excisional debridement was then carried out with knife, rongeurs, and curettes.  Following this, a Brunner incision was then made directly over the distal tip of the finger extending proximally.  The flexor sheath was then opened up and then a thorough irrigation of the flexor sheath was then carried out.  There was a moderate amount of purulence along the course of the flexor sheath.  Thorough wound irrigation was done throughout the entire finger.  After thorough exploration of the flexor sheath and excisional debridement, the wound was then loosely closed with simple Prolene sutures.  Adaptic dressing and sterile compressive bandage then applied. The patient tolerated the procedure well, was placed in a finger splint, extubated, and taken to recovery room in good condition.  POSTOPERATIVE PLAN:  The patient will be admitted for IV antibiotics and wound care therapy with a therapist.  Plan to look at the wound in approximately 48 hours.  My concern is the viability of the distal tip. There is a lot of necrosis and nonviable tissue  distally, and we will hopefully try to save the tip of the finger.     Madelynn Done, MD     FWO/MEDQ  D:  08/18/2011  T:  08/19/2011  Job:  [id number]

## 2011-08-19 NOTE — Progress Notes (Signed)
Hydrotherapy Evaluation   08/19/11 0800  Subjective Assessment  Subjective My nail was torn down low and the lady said she could fix it.    Patient and Family Stated Goals Finger to heal  Date of Onset 08/04/11  Prior Treatments Urgent care I+D.    Evaluation and Treatment  Evaluation and Treatment Procedures Explained to Patient/Family Yes  Evaluation and Treatment Procedures agreed to  Wound 08/17/11 Other (Comment) Finger (Comment which one) Right;Anterior;Posterior RT middle finger swollen , nail bed red, sm incision on tip  of finger.  Date First Assessed/Time First Assessed: 08/17/11 1530   Wound Type: (c) Other (Comment)  Location: (c) Finger (Comment which one)  Location Orientation: Right;Anterior;Posterior  Wound Description (Comments): RT middle finger swollen , nail bed red, sm   Site / Wound Assessment Black;Painful;Red;Pink  % Wound base Red or Granulating 70%  % Wound base Yellow 0%  % Wound base Black 30%  Peri-wound Assessment Edema  Wound Length (cm) 4.2 cm  Wound Width (cm) 5.8 cm  Wound Depth (cm) 0.1 cm  Drainage Amount Moderate  Drainage Description Sanguineous;No odor  Treatment Hydrotherapy (Whirlpool)  Dressing Type Impregnated gauze (petrolatum) (dry 2x2, kerlix, splint and ace.  )  Dressing Changed Changed  Hydrotherapy  Whirlpool Therapy - Minutes 10 min  Whirlpool Therapy - Wound Location R Hand Middle Finger  Wound Therapy - Assess/Plan/Recommendations  Wound Therapy - Clinical Statement pt rpesents s/p I+D of R middle finger infection.  pt would benefit from Hydrotherapy to improve wound healing.    Wound Therapy - Functional Problem List I+D R middle finger  Factors Delaying/Impairing Wound Healing Infection - systemic/local  Hydrotherapy Plan Debridement;Dressing change;Patient/family education;Pulsatile lavage with suction;Whirlpool  Wound Therapy - Frequency 6X / week  Wound Therapy - Current Recommendations PT;OT;Case manager/social work  Wound  Therapy - Follow Up Recommendations Home health RN  Wound Therapy Goals - Improve the function of patient's integumentary system by progressing the wound(s) through the phases of wound healing by:  Decrease Necrotic Tissue to 10%  Decrease Necrotic Tissue - Progress Goal set today  Increase Granulation Tissue to 90%  Increase Granulation Tissue - Progress Goal set today  Decrease Length/Width/Depth by (cm) 1.0  Decrease Length/Width/Depth - Progress Goal set today  Improve Drainage Characteristics Min  Improve Drainage Characteristics - Progress Goal set today  Patient/Family will be able to  Verbalize and demo AROM and positioning of R Hand.    Patient/Family Instruction Goal - Progress Goal set today  Goals/treatment plan/discharge plan were made with and agreed upon by patient/family Yes  Time For Goal Achievement 7 days  Wound Therapy - Potential for Goals Excellent    Daksha Koone, PT [phone]

## 2011-08-19 NOTE — Progress Notes (Signed)
Occupational Therapy Treatment Patient Details Name: [name] MRN: [id number] DOB: [birth year] Today's Date: 08/19/2011  OT Assessment/Plan OT Assessment/Plan OT Plan: Discharge plan remains appropriate OT Goals ADL Goals ADL Goal: Additional Goal #1 - Progress: Progressing toward goals ADL Goal: Additional Goal #2 - Progress: Progressing toward goals  OT Treatment Precautions/Restrictions  Precautions Precautions: Fall Precaution Comments: Pt reports she "slid out of bed" afternoon of 3/12- RN aware and pt with no injury Restrictions Weight Bearing Restrictions: No   ADL ADL Grooming: Performed;Minimal assistance Grooming Details (indicate cue type and reason): assist for opening and manipulation of grooming items. pt easily fatigued and requesting to return to bed after one activity at sink x ~71min Where Assessed - Grooming: Standing at sink Toilet Transfer: Research scientist (life sciences) Details (indicate cue type and reason): Supervision/Min guard A with RW ambulation. Toilet Transfer Method: Proofreader: Regular height toilet;Grab bars Toileting - Clothing Manipulation: Performed;Minimal assistance Where Assessed - Glass blower/designer Manipulation: Standing Toileting - Hygiene: Not assessed Ambulation Related to ADLs: Supervision/Min guard A with RW ambulation to/from bathroom. Pt easily fatigued and with 2/4 on dyspnea scale ADL Comments: Pt is limited by body habitus and dec activity level (at baseline). Pt states her dtr will be able to provide all needed assist upon d/c home Mobility  Bed Mobility Supine to Sit: 4: Min assist;HOB elevated (Comment degrees) (~30degrees) Supine to Sit Details (indicate cue type and reason): required therapist assist to sit upright at EOB Sit to Supine: 4: Min assist;HOB flat Sit to Supine - Details (indicate cue type and reason): Min A to lift RLE into bed. pt with greater difficulty secondary  to body habitus  End of Session OT - End of Session Activity Tolerance: Patient tolerated treatment well Patient left: in bed;with call bell in reach General Behavior During Session: [facility] for tasks performed Cognition: [facility] for tasks performed  Avonne Berkery  08/19/2011, 3:36 PM

## 2011-08-19 NOTE — Progress Notes (Signed)
Notes reviewed today Will look at wound tomorrow Continue iv antibiotics Hope to set up outpatient wound care if finger looking better

## 2011-08-20 MED ORDER — CHLORHEXIDINE GLUCONATE 4 % EX LIQD
60.0000 mL | Freq: Once | CUTANEOUS | Status: DC
  Filled 2011-08-20: qty 60

## 2011-08-20 MED ORDER — KCL IN DEXTROSE-NACL 20-5-0.45 MEQ/L-%-% IV SOLN
INTRAVENOUS | Status: DC
  Administered 2011-08-21: 01:00:00 via INTRAVENOUS
  Filled 2011-08-20 (×3): qty 1000

## 2011-08-20 NOTE — Progress Notes (Signed)
PT Hydrotherapy Note:    08/20/11 1300  Subjective Assessment  Subjective "Don't wrap it so big today!"  Date of Onset 08/04/11  Prior Treatments Urgent care I+D.    Evaluation and Treatment  Evaluation and Treatment Procedures Explained to Patient/Family Yes  Evaluation and Treatment Procedures agreed to  Wound 08/17/11 Other (Comment) Finger (Comment which one) Right;Anterior;Posterior RT middle finger swollen , nail bed red, sm incision on tip  of finger.  Date First Assessed/Time First Assessed: 08/17/11 1530   Wound Type: (c) Other (Comment)  Location: (c) Finger (Comment which one)  Location Orientation: Right;Anterior;Posterior  Wound Description (Comments): RT middle finger swollen , nail bed red, sm   Site / Wound Assessment Black;Painful;Red;Pink  % Wound base Red or Granulating 70%  % Wound base Black 30%  Peri-wound Assessment Edema  Drainage Amount Moderate  Drainage Description Sanguineous;No odor  Treatment Hydrotherapy (Whirlpool)  Dressing Type Impregnated gauze (petrolatum) (dry 2x2 gauze, kerlex)  Dressing Changed New  Dressing Status Clean;Dry;Intact  Hydrotherapy  Whirlpool Therapy - Minutes 10 min  Whirlpool Therapy - Wound Location R Hand Middle Finger  Wound Therapy - Assess/Plan/Recommendations  Hydrotherapy Plan Debridement;Dressing change;Patient/family education;Pulsatile lavage with suction;Whirlpool  Wound Therapy - Frequency 6X / week  Wound Therapy - Follow Up Recommendations Home health RN  Wound Therapy Goals - Improve the function of patient's integumentary system by progressing the wound(s) through the phases of wound healing by:  Decrease Necrotic Tissue - Progress Not met  Increase Granulation Tissue - Progress Not met      Verdell Face, PTA [phone] 08/20/2011

## 2011-08-20 NOTE — Progress Notes (Signed)
ANTIBIOTIC CONSULT NOTE - FOLLOW UP  Pharmacy Consult for Vancomycin  Indication: finger infection   Allergies  Allergen Reactions  . Penicillins Itching    Patient Measurements: Height: 5\' 1"  (154.9 cm) Weight: 273 lb 4.8 oz (123.968 kg) IBW/kg (Calculated) : 47.8   Vital Signs: Temp: 98.4 F (36.9 C) (03/13 0726) BP: 120/52 mmHg (03/13 0726) Pulse Rate: 87  (03/13 0726) Intake/Output from previous day: 03/12 0701 - 03/13 0700 In: 730 [P.O.:480; IV Piggyback:250] Out: -  Intake/Output from this shift:    Labs:  Basename 08/17/11 1608  WBC 5.6  HGB 13.2  PLT 184  LABCREA --  CREATININE 0.86   Estimated Creatinine Clearance: 90.3 ml/min (by C-G formula based on Cr of 0.86).  Basename 08/20/11 1008  VANCOTROUGH 14.5  VANCOPEAK --  VANCORANDOM --  GENTTROUGH --  GENTPEAK --  GENTRANDOM --  TOBRATROUGH --  TOBRAPEAK --  TOBRARND --  AMIKACINPEAK --  AMIKACINTROU --  AMIKACIN --     Assessment: 56 yo female on vancomycin day 4 for right finger infection s/p I&D. Vancomycin trough is 14.5 and is at goal.  Goal of Therapy:  Vancomycin trough level 10-15 mcg/ml  Plan:  -No vancomycin changes needed  Benny Lennert 08/20/2011,12:55 PM

## 2011-08-20 NOTE — Progress Notes (Signed)
PT SEEN EXAMINED FINGER TIP DOES NOT LOOK VIABLE PROXIMAL TO DIP JOINT FINGER LOOKS GOOD GANGRENOUS FINGER TIP LIMITED FINGER MOBILITY  PLAN: TO OR TOMORROW FOR REPEAT DEBRIDEMENT AND LIKELY AMPUTATION OF FINGER TIP PT VOICED UNDERSTANDING OF PLAN WILL DO PREOP ORDERS  R/B/A DISCUSSED WITH PT IN HOSPITAL.  PT VOICED UNDERSTANDING OF PLAN CONSENT SIGNED DAY OF SURGERY PT SEEN AND EXAMINED PRIOR TO OPERATIVE PROCEDURE/DAY OF SURGERY SITE MARKED ON DAY OF SURGERY. QUESTIONS ANSWERED WILL REMAIN AN INPATIENT FOLLOWING SURGERY

## 2011-08-21 ENCOUNTER — Encounter (HOSPITAL_COMMUNITY)
Admission: EM | Disposition: A | Discharge status: Home / Self Care | Payer: Self-pay | Source: Ambulatory Visit | Attending: Orthopedic Surgery

## 2011-08-21 ENCOUNTER — Encounter (HOSPITAL_COMMUNITY): Payer: Self-pay | Admitting: Anesthesiology

## 2011-08-21 ENCOUNTER — Inpatient Hospital Stay (HOSPITAL_COMMUNITY): Payer: Medicaid Other | Admitting: Anesthesiology

## 2011-08-21 HISTORY — PX: AMPUTATION: SHX166

## 2011-08-21 SURGERY — AMPUTATION DIGIT
Anesthesia: General | Site: Finger | Laterality: Right | Wound class: Dirty or Infected

## 2011-08-21 MED ORDER — MIDAZOLAM HCL 5 MG/5ML IJ SOLN
INTRAMUSCULAR | Status: DC | PRN
  Administered 2011-08-21: 2 mg via INTRAVENOUS

## 2011-08-21 MED ORDER — ONDANSETRON HCL 4 MG/2ML IJ SOLN: 4.0000 mg | Freq: Once | INTRAMUSCULAR | Status: AC | PRN

## 2011-08-21 MED ORDER — HYDROMORPHONE HCL PF 1 MG/ML IJ SOLN
0.2500 mg | INTRAMUSCULAR | Status: DC | PRN
Start: 2011-08-21 — End: 2011-08-22

## 2011-08-21 MED ORDER — KCL IN DEXTROSE-NACL 20-5-0.45 MEQ/L-%-% IV SOLN
INTRAVENOUS | Status: DC
  Administered 2011-08-21: 20:00:00 via INTRAVENOUS
  Filled 2011-08-21 (×2): qty 1000

## 2011-08-21 MED ORDER — PROPOFOL 10 MG/ML IV EMUL
INTRAVENOUS | Status: DC | PRN
  Administered 2011-08-21: 140 mg via INTRAVENOUS

## 2011-08-21 MED ORDER — LIDOCAINE HCL (CARDIAC) 20 MG/ML IV SOLN
INTRAVENOUS | Status: DC | PRN
  Administered 2011-08-21: 100 mg via INTRAVENOUS

## 2011-08-21 MED ORDER — ONDANSETRON HCL 4 MG/2ML IJ SOLN
INTRAMUSCULAR | Status: DC | PRN
  Administered 2011-08-21: 4 mg via INTRAVENOUS

## 2011-08-21 MED ORDER — DEXAMETHASONE SODIUM PHOSPHATE 4 MG/ML IJ SOLN
INTRAMUSCULAR | Status: DC | PRN
  Administered 2011-08-21: 4 mg via INTRAVENOUS

## 2011-08-21 MED ORDER — DROPERIDOL 2.5 MG/ML IJ SOLN
INTRAMUSCULAR | Status: DC | PRN
  Administered 2011-08-21: 0.625 mg via INTRAVENOUS

## 2011-08-21 MED ORDER — LACTATED RINGERS IV SOLN
INTRAVENOUS | Status: DC | PRN
  Administered 2011-08-21: 16:00:00 via INTRAVENOUS

## 2011-08-21 MED ORDER — SUFENTANIL CITRATE 50 MCG/ML IV SOLN
INTRAVENOUS | Status: DC | PRN
  Administered 2011-08-21: 20 ug via INTRAVENOUS

## 2011-08-21 SURGICAL SUPPLY — 45 items
BANDAGE COBAN STERILE 2 (GAUZE/BANDAGES/DRESSINGS) ×2 IMPLANT
BANDAGE CONFORM 2  STR LF (GAUZE/BANDAGES/DRESSINGS) ×2 IMPLANT
BANDAGE ELASTIC 3 VELCRO ST LF (GAUZE/BANDAGES/DRESSINGS) IMPLANT
BANDAGE ELASTIC 4 VELCRO ST LF (GAUZE/BANDAGES/DRESSINGS) IMPLANT
BANDAGE GAUZE ELAST BULKY 4 IN (GAUZE/BANDAGES/DRESSINGS) ×2 IMPLANT
BNDG COHESIVE 1X5 TAN STRL LF (GAUZE/BANDAGES/DRESSINGS) ×2 IMPLANT
BNDG ELASTIC 2 VLCR STRL LF (GAUZE/BANDAGES/DRESSINGS) ×2 IMPLANT
CLOTH BEACON ORANGE TIMEOUT ST (SAFETY) ×2 IMPLANT
CORDS BIPOLAR (ELECTRODE) ×2 IMPLANT
COVER SURGICAL LIGHT HANDLE (MISCELLANEOUS) ×2 IMPLANT
CUFF TOURNIQUET SINGLE 18IN (TOURNIQUET CUFF) ×2 IMPLANT
CUFF TOURNIQUET SINGLE 24IN (TOURNIQUET CUFF) IMPLANT
DRAPE SURG 17X23 STRL (DRAPES) ×2 IMPLANT
DRSG ADAPTIC 3X8 NADH LF (GAUZE/BANDAGES/DRESSINGS) ×2 IMPLANT
GAUZE SPONGE 2X2 8PLY STRL LF (GAUZE/BANDAGES/DRESSINGS) ×1 IMPLANT
GLOVE BIOGEL PI IND STRL 6.5 (GLOVE) ×1 IMPLANT
GLOVE BIOGEL PI IND STRL 8.5 (GLOVE) ×1 IMPLANT
GLOVE BIOGEL PI INDICATOR 6.5 (GLOVE) ×1
GLOVE BIOGEL PI INDICATOR 8.5 (GLOVE) ×1
GLOVE ECLIPSE 6.5 STRL STRAW (GLOVE) ×2 IMPLANT
GLOVE SURG ORTHO 8.0 STRL STRW (GLOVE) ×2 IMPLANT
GOWN PREVENTION PLUS XLARGE (GOWN DISPOSABLE) ×2 IMPLANT
GOWN STRL NON-REIN LRG LVL3 (GOWN DISPOSABLE) ×2 IMPLANT
KIT BASIN OR (CUSTOM PROCEDURE TRAY) ×2 IMPLANT
KIT ROOM TURNOVER OR (KITS) ×2 IMPLANT
MANIFOLD NEPTUNE II (INSTRUMENTS) ×2 IMPLANT
NEEDLE HYPO 25GX1X1/2 BEV (NEEDLE) IMPLANT
NS IRRIG 1000ML POUR BTL (IV SOLUTION) ×2 IMPLANT
PACK ORTHO EXTREMITY (CUSTOM PROCEDURE TRAY) ×2 IMPLANT
PAD ARMBOARD 7.5X6 YLW CONV (MISCELLANEOUS) ×4 IMPLANT
PAD CAST 4YDX4 CTTN HI CHSV (CAST SUPPLIES) IMPLANT
PADDING CAST COTTON 4X4 STRL (CAST SUPPLIES)
SANITIZER FOAM REILL 1200ML (MISCELLANEOUS) ×2 IMPLANT
SOAP 2 % CHG 4 OZ (WOUND CARE) ×2 IMPLANT
SPECIMEN JAR SMALL (MISCELLANEOUS) ×2 IMPLANT
SPONGE GAUZE 2X2 STER 10/PKG (GAUZE/BANDAGES/DRESSINGS) ×1
SPONGE GAUZE 4X4 12PLY (GAUZE/BANDAGES/DRESSINGS) IMPLANT
SUCTION FRAZIER TIP 10 FR DISP (SUCTIONS) IMPLANT
SUT MERSILENE 4 0 P 3 (SUTURE) IMPLANT
SUT PROLENE 4 0 PS 2 18 (SUTURE) ×2 IMPLANT
SYR CONTROL 10ML LL (SYRINGE) IMPLANT
TOWEL OR 17X24 6PK STRL BLUE (TOWEL DISPOSABLE) ×2 IMPLANT
TOWEL OR 17X26 10 PK STRL BLUE (TOWEL DISPOSABLE) ×2 IMPLANT
TUBE CONNECTING 12X1/4 (SUCTIONS) IMPLANT
WATER STERILE IRR 1000ML POUR (IV SOLUTION) ×2 IMPLANT

## 2011-08-21 NOTE — Progress Notes (Signed)
PT SEEN EXAMINED PT WITH BAD INFECTION TO TIP OF RIGHT LONG FINGER  PLAN FOR REPEAT DEBRIDEMENT AND LIKELY AMPUTATION OF DEAD FINGER TIP, THROUGH LEVEL OF DIP JOINT RISKS INCLUDE NOT LIMITED TO BLEEDING,INFECTION, NERVE,ARTERY, TENDON DAMAGE, LOSS OF MOTION OF FINGERS,WRIST,FOREARM,ELBOW AND NEED FOR FURTHER SURGERY.  R/B/A DISCUSSED WITH PT IN HOSPITAL.  PT VOICED UNDERSTANDING OF PLAN CONSENT SIGNED DAY OF SURGERY PT SEEN AND EXAMINED PRIOR TO OPERATIVE PROCEDURE/DAY OF SURGERY SITE MARKED. QUESTIONS ANSWERED WILL REMAIN AN INPATIENT FOLLOWING SURGERY

## 2011-08-21 NOTE — Anesthesia Postprocedure Evaluation (Signed)
Anesthesia Post Note  Patient: [name]  Procedure(s) Performed: Procedure(s) (LRB): AMPUTATION DIGIT (Right)  Anesthesia type: General  Patient location: PACU  Post pain: Pain level controlled  Post assessment: Patient's Cardiovascular Status Stable  Last Vitals:  Filed Vitals:   08/21/11 1754  BP: 131/83  Pulse:   Temp:   Resp:     Post vital signs: Reviewed and stable  Level of consciousness: alert  Complications: No apparent anesthesia complications

## 2011-08-21 NOTE — Transfer of Care (Signed)
Immediate Anesthesia Transfer of Care Note  Patient: [name]  Procedure(s) Performed: Procedure(s) (LRB): AMPUTATION DIGIT (Right)  Patient Location: PACU  Anesthesia Type: General  Level of Consciousness: awake, oriented, sedated, patient cooperative and responds to stimulation  Airway & Oxygen Therapy: Patient Spontanous Breathing and Patient connected to nasal cannula oxygen  Post-op Assessment: Report given to PACU RN, Post -op Vital signs reviewed and stable and Patient moving all extremities  Post vital signs: Reviewed and stable  Complications: No apparent anesthesia complications

## 2011-08-21 NOTE — Progress Notes (Signed)
PT Hydrotherapy Cancel Note:    Hydrotherapy cancelled today due to pt to OR today for repeat debridement & likely amputation of finger tip.    [name], [place] [phone] 08/21/2011

## 2011-08-21 NOTE — Anesthesia Preprocedure Evaluation (Signed)
Anesthesia Evaluation  Patient identified by MRN, date of birth, ID band Patient awake    Reviewed: Allergy & Precautions, H&P , NPO status , Patient's Chart, lab work & pertinent test results  Airway Mallampati: II TM Distance: >3 FB     Dental  (+) Teeth Intact   Pulmonary  breath sounds clear to auscultation        Cardiovascular Rhythm:Regular Rate:Normal     Neuro/Psych    GI/Hepatic   Endo/Other    Renal/GU      Musculoskeletal   Abdominal   Peds  Hematology   Anesthesia Other Findings   Reproductive/Obstetrics                           Anesthesia Physical Anesthesia Plan  ASA: III  Anesthesia Plan: General   Post-op Pain Management:    Induction: Intravenous  Airway Management Planned: LMA  Additional Equipment:   Intra-op Plan:   Post-operative Plan:   Informed Consent: I have reviewed the patients History and Physical, chart, labs and discussed the procedure including the risks, benefits and alternatives for the proposed anesthesia with the patient or authorized representative who has indicated his/her understanding and acceptance.   Dental advisory given  Plan Discussed with: CRNA and Surgeon  Anesthesia Plan Comments: (Infection L. Middle Finger Obesity Htn CAD S/P MI x2 per patient and chart, no specific documentation in chart H/O CVA X2 with apparent expressive aphasia.  Plan GA with LMA  Kipp Brood, MD )        Anesthesia Quick Evaluation

## 2011-08-21 NOTE — Brief Op Note (Signed)
08/17/2011 - 08/21/2011  5:22 PM  PATIENT:  Matthew Folks  56 y.o. female  PRE-OPERATIVE DIAGNOSIS:  RIGHT LONG FINGER INFECTION  POST-OPERATIVE DIAGNOSIS:  Right Long Finger Infection   PROCEDURE:  Procedure(s) (LRB): AMPUTATION DIGIT (Right)  SURGEON:  Surgeon(s) and Role:    * Sharma Covert, MD - Primary  PHYSICIAN ASSISTANT:   ASSISTANTS: none   ANESTHESIA:   general  EBL:     BLOOD ADMINISTERED:none  DRAINS: none   LOCAL MEDICATIONS USED:  NONE  SPECIMEN:  No Specimen  DISPOSITION OF SPECIMEN:  N/A  COUNTS:  YES  TOURNIQUET:   Total Tourniquet Time Documented: Forearm (Right) - 20 minutes  DICTATION: .Other Dictation: Dictation Number [id number]  PLAN OF CARE: Admit to inpatient   PATIENT DISPOSITION:  PACU - hemodynamically stable.   Delay start of Pharmacological VTE agent (>24hrs) due to surgical blood loss or risk of bleeding: not applicable

## 2011-08-22 MED ORDER — HYDROCODONE-ACETAMINOPHEN 7.5-500 MG PO TABS: 1.0000 | ORAL_TABLET | Freq: Four times a day (QID) | ORAL | Status: AC | PRN

## 2011-08-22 MED ORDER — DOCUSATE SODIUM 100 MG PO CAPS: 100.0000 mg | ORAL_CAPSULE | Freq: Two times a day (BID) | ORAL | Status: AC

## 2011-08-22 NOTE — Discharge Instructions (Signed)
KEEP BANDAGE CLEAN AND DRY do not change CALL OFFICE FOR F/U APPT [id number] in six days KEEP HAND ELEVATED ABOVE HEART OK TO APPLY ICE TO OPERATIVE AREA CONTACT OFFICE IF ANY WORSENING PAIN OR CONCERNS.

## 2011-08-22 NOTE — Discharge Summary (Signed)
Physician Discharge Summary  Patient ID: [name] MRN: [id number] DOB/AGE: 06-24-1955 56 y.o.  Admit date: 08/17/2011 Discharge date: 08/22/2011  Admission Diagnoses: RIGHT LONG FINGER INFECTION Past Medical History  Diagnosis Date  . Hypertension     Discharge Diagnoses:  See chart  Surgeries: Procedure(s):see chart AMPUTATION DIGIT on 08/21/2011    Consultants:  none  Discharged Condition: Improved  Hospital Course: [name] is an 56 y.o. female who was admitted 08/17/2011 with a chief complaint of  Chief Complaint  Patient presents with  . Wound Infection    middle RT finger  , and found to have a diagnosis of RIGHT LONG FINGER INFECTION.  They were brought to the operating room on 08/21/2011 and underwent Procedure(s): AMPUTATION DIGIT.    They were given perioperative antibiotics: Anti-infectives     Start     Dose/Rate Route Frequency Ordered Stop   08/18/11 2230   vancomycin (VANCOCIN) 1,250 mg in sodium chloride 0.9 % 250 mL IVPB        1,250 mg 166.7 mL/hr over 90 Minutes Intravenous Every 12 hours 08/18/11 2139     08/17/11 1730   cefTRIAXone (ROCEPHIN) 1 g in dextrose 5 % 50 mL IVPB        1 g 100 mL/hr over 30 Minutes Intravenous  Once 08/17/11 1718 08/17/11 1837   08/17/11 1715   vancomycin (VANCOCIN) IVPB 1000 mg/200 mL premix  Status:  Discontinued        1,000 mg 200 mL/hr over 60 Minutes Intravenous Every 12 hours 08/17/11 1714 08/18/11 2115   08/17/11 1600   vancomycin (VANCOCIN) IVPB 1000 mg/200 mL premix  Status:  Discontinued        1,000 mg 200 mL/hr over 60 Minutes Intravenous  Once 08/17/11 1547 08/17/11 1714   08/17/11 0000   clindamycin (CLEOCIN) 150 MG capsule        300 mg Oral Every 6 hours 08/17/11 2355 08/27/11 2359        .  They were given sequential compression devices, early ambulation, and for DVT prophylaxis.  Recent vital signs: Patient Vitals for the past 24 hrs:  BP Temp Temp src Pulse Resp SpO2    08/22/11 0513 149/63 mmHg 97.8 F (36.6 C) Oral 72  20  100 %  08/21/11 2100 147/77 mmHg 98.9 F (37.2 C) - 81  20  96 %  .  Recent laboratory studies: No results found.  Discharge Medications:   Medication List  As of 08/22/2011  6:28 PM   STOP taking these medications         HYDROcodone-acetaminophen 7.5-750 MG per tablet      traMADol 50 MG tablet         TAKE these medications         clindamycin 150 MG capsule   Commonly known as: CLEOCIN   Take 2 capsules (300 mg total) by mouth every 6 (six) hours.      cyclobenzaprine 10 MG tablet   Commonly known as: FLEXERIL   Take 10 mg by mouth 3 (three) times daily as needed. For muscle spasms      docusate sodium 100 MG capsule   Commonly known as: COLACE   Take 1 capsule (100 mg total) by mouth 2 (two) times daily.      HYDROcodone-acetaminophen 7.5-500 MG per tablet   Commonly known as: LORTAB   Take 1 tablet by mouth every 6 (six) hours as needed for pain.  oxyCODONE-acetaminophen 5-325 MG per tablet   Commonly known as: PERCOCET   Take 1-2 tablets by mouth every 6 (six) hours as needed for pain.      sulfamethoxazole-trimethoprim 800-160 MG per tablet   Commonly known as: BACTRIM DS   Take 2 tablets by mouth 2 (two) times daily. For 10 days  Started 3/6            Diagnostic Studies: No results found.  They benefited maximally from their hospital stay and there were no complications.     Disposition: Home    Signed: Sharma Covert 08/22/2011, 6:28 PM

## 2011-08-22 NOTE — Progress Notes (Signed)
Utilization review completed. Hy Swiatek, RN, BSN.  08/22/11  

## 2011-08-22 NOTE — Op Note (Signed)
NAMEPREETHI, [name]              ACCOUNT NO.:  [id number]  MEDICAL RECORD NO.:  [id number]  LOCATION:  5014                         FACILITY:  [facility]  PHYSICIAN:  Madelynn Done, MD  DATE OF BIRTH:  1955-08-04  DATE OF PROCEDURE:  08/21/2011 DATE OF DISCHARGE:                              OPERATIVE REPORT   PREOPERATIVE DIAGNOSIS:  Right long finger distal phalangeal gangrene.  POSTOPERATIVE DIAGNOSIS:  Right long finger distal phalangeal gangrene.  Attending physician is Dr. Bradly Bienenstock is scrubbed and present for the entire procedure.  ASSISTANT SURGEON:  None.  ANESTHESIA:  General.  PROCEDURE:  Right long finger amputation through the distal interphalangeal joint with local neurectomies and advancement flap closure.  SURGICAL INDICATIONS:  [name] is a 56 year old gentleman who unfortunately had a bad infection in her fingertips.  The patient had an ischemic injury to the long finger and a gangrenous left long finger tip.  The patient was counseled about undergoing the above procedure. Informed consent was obtained.  Risks, benefits, and alternatives were discussed in detail with the patient.  Signed informed consent was obtained.  Risks include, but not limited to bleeding, infection, damage to nearby nerves, arteries, or tendons, persistent infection, loss of motion of wrist and digits, and need for further surgical intervention.  DESCRIPTION OF PROCEDURE:  The patient was properly identified in the preop holding area, mark with a permanent marker made on the long finger to indicate correct operative site.  The patient brought back to the operating room placed supine on anesthesia room table where general anesthesia was administered.  The patient tolerated this well.  Well- padded tourniquet was then placed on the right brachium and sealed with 1000 drape.  Right upper extremity was prepped and draped in normal sterile fashion.  Time-out was called, correct  side was identified, and procedure then begun.  Attention then turned to the right long finger, where a fishmouth incision was then made over the long finger distal tip.  A sharp dissection was then carried down through the FDP and extensor tendon.  Local neurectomies then carried out and nerves were allowed to retract proximally.  This is amputation was completed through the distal interphalangeal joint.  However, portion of the middle phalanx were then removed.  The amputation was at the distal portion of middle phalanx making sure the preserve the attachment of the FDS.  This was necessary for skin closure in order to obtain skin closure.  Local advancement flap was then carried out, and brought from a volar to dorsal direction.  The wound was then thoroughly irrigated.  The skin was then advanced and closed with simple Prolene sutures.  Adaptic dressing and sterile compressive bandage then applied.  Tourniquet was deflated.  The patient tolerated the procedure well and returned to recovery room in good condition.  POSTOPERATIVE PLAN:  The patient will be admitted back to the inpatient service, will be discharged in the morning.  Following approximately 1 week for wound check.     Madelynn Done, MD     FWO/MEDQ  D:  08/21/2011  T:  08/22/2011  Job:  [id number]

## 2011-08-22 NOTE — Progress Notes (Signed)
Hydrotherapy Discharge Note  Hydrotherapy Dc'd due to pt underwent amputation and closure of finger.  [employer] PT [phone]

## 2011-08-22 NOTE — Progress Notes (Signed)
ANTIBIOTIC CONSULT NOTE - FOLLOW UP  Pharmacy Consult for Vancomycin  Indication: R long finger infection, now s/p amputation   Allergies  Allergen Reactions  . Penicillins Itching    Patient Measurements: Height: 5\' 1"  (154.9 cm) Weight: 273 lb 4.8 oz (123.968 kg) IBW/kg (Calculated) : 47.8   Vital Signs: Temp: 97.8 F (36.6 C) (03/15 0513) Temp src: Oral (03/15 0513) BP: 149/63 mmHg (03/15 0513) Pulse Rate: 72  (03/15 0513)   Basename 08/20/11 1008  VANCOTROUGH 14.5  VANCOPEAK --  VANCORANDOM --  GENTTROUGH --  GENTPEAK --  GENTRANDOM --  TOBRATROUGH --  TOBRAPEAK --  TOBRARND --  AMIKACINPEAK --  AMIKACINTROU --  AMIKACIN --     Assessment: 56 y.o. F on Vancomycin D#6 for empiric coverage of R long finger infection, now s/p amputation today. Afebrile, WBC WNL. Last BMET on 3/10, Vancomycin trough on 3/13 at goal. Usual duration of antibiotics post amputation is 24-48 hours as source of infection removed  Goal of Therapy:  Vancomycin trough level 10-15 mcg/ml  Plan:  1. Continue Vancomycin 1250 mg IV every 12 hours 2. Please address antibiotic LOT now that source of infection removed 3. Will continue to follow renal function, culture results, LOT, and antibiotic de-escalation plans   Georgina Pillion, PharmD, BCPS Clinical Pharmacist Pager: [phone] 08/22/2011 9:43 AM

## 2011-08-22 NOTE — Progress Notes (Signed)
Occupational Therapy Treatment Patient Details Name: [name] MRN: [id number] DOB: [birth year] Today's Date: 08/22/2011  OT Assessment/Plan OT Assessment/Plan Comments on Treatment Session: Pt. hoping to discharge home today.  Dtr. supportive and providing assist.  Pt. will not use Rt. UE for BADLs, and insists that dtr. help her.  She does not want further therapy at this time, as she feels she has ample support.  She is able to verbalize undestanding of edema control techniques OT Plan: Other (comment) (Education completed, pt. discharged) Follow Up Recommendations: No OT follow up Equipment Recommended: None recommended by OT OT Goals ADL Goals ADL Goal: Additional Goal #1 - Progress: Met ADL Goal: Additional Goal #2 - Progress: Met  OT Treatment Precautions/Restrictions      ADL ADL Eating/Feeding: Simulated;Set up Where Assessed - Eating/Feeding: Chair ADL Comments: Pt. sitting up in chair with dtr. present.  Pt. anticipating D/C today, and is eager to go home.  She reports that daughter has been helping her with self care activities as she is leary of using Rt. hand at this time.  She reports that dtr will continue to provide assistance at discharge.  Reviewed edema control techniques and pt. verbalized understanding.  Pt. does not feel she requires/nor wants further therapies Mobility    Exercises    End of Session    [name] M  08/22/2011, 2:33 PM

## 2011-08-25 ENCOUNTER — Encounter (HOSPITAL_COMMUNITY): Payer: Self-pay | Admitting: Orthopedic Surgery

## 2011-08-26 ENCOUNTER — Encounter (HOSPITAL_COMMUNITY): Payer: Self-pay | Admitting: Orthopedic Surgery

## 2012-02-05 ENCOUNTER — Emergency Department (HOSPITAL_COMMUNITY)
Admission: EM | Admit: 2012-02-05 | Discharge: 2012-02-05 | Disposition: A | Discharge status: Home / Self Care | Payer: Medicaid Other | Attending: Emergency Medicine | Admitting: Emergency Medicine

## 2012-02-05 ENCOUNTER — Encounter (HOSPITAL_COMMUNITY): Payer: Self-pay | Admitting: Emergency Medicine

## 2012-02-05 ENCOUNTER — Emergency Department (HOSPITAL_COMMUNITY): Payer: Medicaid Other

## 2012-02-05 DIAGNOSIS — M1990 Unspecified osteoarthritis, unspecified site: Secondary | ICD-10-CM

## 2012-02-05 DIAGNOSIS — M1710 Unilateral primary osteoarthritis, unspecified knee: Secondary | ICD-10-CM | POA: Insufficient documentation

## 2012-02-05 DIAGNOSIS — M25569 Pain in unspecified knee: Secondary | ICD-10-CM

## 2012-02-05 DIAGNOSIS — I2510 Atherosclerotic heart disease of native coronary artery without angina pectoris: Secondary | ICD-10-CM | POA: Insufficient documentation

## 2012-02-05 DIAGNOSIS — F17200 Nicotine dependence, unspecified, uncomplicated: Secondary | ICD-10-CM | POA: Insufficient documentation

## 2012-02-05 DIAGNOSIS — M129 Arthropathy, unspecified: Secondary | ICD-10-CM | POA: Insufficient documentation

## 2012-02-05 DIAGNOSIS — I10 Essential (primary) hypertension: Secondary | ICD-10-CM | POA: Insufficient documentation

## 2012-02-05 DIAGNOSIS — E669 Obesity, unspecified: Secondary | ICD-10-CM | POA: Insufficient documentation

## 2012-02-05 HISTORY — DX: Unspecified osteoarthritis, unspecified site: M19.90

## 2012-02-05 HISTORY — DX: Atherosclerotic heart disease of native coronary artery without angina pectoris: I25.10

## 2012-02-05 HISTORY — DX: Unspecified asthma, uncomplicated: J45.909

## 2012-02-05 HISTORY — DX: Cerebral infarction, unspecified: I63.9

## 2012-02-05 NOTE — ED Provider Notes (Signed)
History     CSN: [id number]  Arrival date & time 02/05/12  1152   First MD Initiated Contact with Patient 02/05/12 1311      Chief Complaint  Patient presents with  . Leg Pain    (Consider location/radiation/quality/duration/timing/severity/associated sxs/prior treatment) HPI Comments: 56 y/o female presents with her daughter with left knee pain "for years" worsening yesterday after her knee "gave out" at the [place]. Denies falling, injury or trauma. Since leaving the bank her knee has been constantly bothering her described as "just pain" rated 10/10. Walking and putting weight on her knee make it worse. Vicodin provides no relief. She has vicodin due to slipped discs in her back. Denies any back pain at this current time. Daughter states patient has "bone on bone" arthritis in both of her knees. The longer patient sits, the harder it is for her to get up and walk. States her knee was a little swollen last night. Denies any skin changes.  Patient is a 56 y.o. female presenting with leg pain. The history is provided by the patient and a relative.  Leg Pain     Past Medical History  Diagnosis Date  . Hypertension   . Arthritis   . Stroke   . Coronary artery disease   . Asthma     Past Surgical History  Procedure Date  . Abdominal hysterectomy   . Coronary stent placement   . I&d extremity 08/18/2011    Procedure: IRRIGATION AND DEBRIDEMENT EXTREMITY;  Surgeon: Sharma Covert, MD;  Location: [facility] OR;  Service: Orthopedics;  Laterality: Right;  I&D Right Long Finger  . Amputation 08/21/2011    Procedure: AMPUTATION DIGIT;  Surgeon: Sharma Covert, MD;  Location: [facility];  Service: Orthopedics;  Laterality: Right;    No family history on file.  History  Substance Use Topics  . Smoking status: Current Everyday Smoker    Types: Cigarettes  . Smokeless tobacco: Not on file  . Alcohol Use: 3.6 oz/week    6 Cans of beer per week    OB History    Grav Para Term Preterm Abortions  TAB SAB Ect Mult Living                  Review of Systems  Constitutional: Positive for activity change.  Musculoskeletal: Positive for joint swelling, arthralgias and gait problem. Negative for back pain.  Skin: Negative for color change.    Allergies  Penicillins  Home Medications   Current Outpatient Rx  Name Route Sig Dispense Refill  . CYCLOBENZAPRINE HCL 10 MG PO TABS Oral Take 10 mg by mouth 3 (three) times daily as needed. For muscle spasms    . DOCUSATE SODIUM 100 MG PO CAPS Oral Take 100 mg by mouth 2 (two) times daily.    Marland Kitchen DOXAZOSIN MESYLATE 4 MG PO TABS Oral Take 4 mg by mouth at bedtime.    Marland Kitchen HYDROCODONE-ACETAMINOPHEN 10-325 MG PO TABS Oral Take 1 tablet by mouth every 6 (six) hours as needed.    . ISOSORBIDE DINITRATE 30 MG PO TABS Oral Take 30 mg by mouth daily.    . LUBIPROSTONE 24 MCG PO CAPS Oral Take 24 mcg by mouth 2 (two) times daily with a meal.    . TRAMADOL HCL 50 MG PO TABS Oral Take 50 mg by mouth every 6 (six) hours as needed.    . TRAZODONE HCL 100 MG PO TABS Oral Take 100 mg by mouth at bedtime.  BP 190/80  Temp 98.2 F (36.8 C) (Oral)  SpO2 100%  Physical Exam  Constitutional: She appears well-developed and well-nourished. No distress.       Patient obese  HENT:  Head: Normocephalic and atraumatic.  Eyes: Conjunctivae are normal.  Neck: Normal range of motion.  Cardiovascular: Normal rate, regular rhythm, normal heart sounds and intact distal pulses.   Pulmonary/Chest: Effort normal and breath sounds normal.  Musculoskeletal:       Left knee: She exhibits decreased range of motion and bony tenderness. She exhibits no swelling, no ecchymosis, no deformity and no erythema. tenderness found. Medial joint line, lateral joint line and patellar tendon tenderness noted.       Unable to perform knee special tests due to patient's body habitus  Neurological: She is alert. No sensory deficit.  Skin: Skin is warm and dry. No rash noted. No  erythema.  Psychiatric: She has a normal mood and affect. Her speech is normal. She is agitated.    ED Course  Procedures (including critical care time)  Labs Reviewed - No data to display Dg Knee Complete 4 Views Left  02/05/2012  *RADIOLOGY REPORT*  Clinical Data: Left knee pain which started yesterday.  No known injury.  LEFT KNEE - COMPLETE 4+ VIEW  Comparison: None.  Findings: Severe medial and patellofemoral compartment joint space narrowing and mild lateral compartment joint space narrowing, with associated hypertrophic spurring.  No fractures.  Borderline osteopenia.  Femoropopliteal and tibioperoneal artery atherosclerosis.  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 No acute osseous abnormality.   Original Report Authenticated By: Arnell Sieving, M.D.      1. Osteoarthritis   2. Knee pain       MDM  56 y/o obese female with chronic left knee pain worsening yesterday. Exam and xray consistent with arthritis. Advised ortho f/u, ibuprofen, ice, elevation.        Trevor Mace, PA-C 02/05/12 1421

## 2012-02-05 NOTE — ED Notes (Addendum)
States that her left knee started hurting to the point where is was difficult for her to ambulate. States that her leg "gives out". States that she has a hx of arthritis. Also denies calf pain. Denies injury or falling

## 2012-02-05 NOTE — ED Provider Notes (Signed)
Medical screening examination/treatment/procedure(s) were performed by non-physician practitioner and as supervising physician I was immediately available for consultation/collaboration.  Ahmir Bracken, MD 02/05/12 1559 

## 2012-02-05 NOTE — ED Notes (Signed)
Patient transported to X-ray 

## 2012-04-27 ENCOUNTER — Encounter (HOSPITAL_COMMUNITY): Payer: Self-pay | Admitting: *Deleted

## 2012-04-27 ENCOUNTER — Emergency Department (HOSPITAL_COMMUNITY): Admission: EM | Admit: 2012-04-27 | Discharge: 2012-04-27 | Discharge status: Against Medical Advice | Payer: Self-pay

## 2012-04-27 ENCOUNTER — Observation Stay (HOSPITAL_COMMUNITY)
Admission: EM | Admit: 2012-04-27 | Discharge: 2012-04-28 | Disposition: A | Discharge status: Home / Self Care | Payer: Medicaid Other | Attending: Internal Medicine | Admitting: Internal Medicine

## 2012-04-27 DIAGNOSIS — K921 Melena: Principal | ICD-10-CM | POA: Insufficient documentation

## 2012-04-27 DIAGNOSIS — K5730 Diverticulosis of large intestine without perforation or abscess without bleeding: Secondary | ICD-10-CM | POA: Insufficient documentation

## 2012-04-27 DIAGNOSIS — J449 Chronic obstructive pulmonary disease, unspecified: Secondary | ICD-10-CM | POA: Insufficient documentation

## 2012-04-27 DIAGNOSIS — I10 Essential (primary) hypertension: Secondary | ICD-10-CM | POA: Insufficient documentation

## 2012-04-27 DIAGNOSIS — K644 Residual hemorrhoidal skin tags: Secondary | ICD-10-CM | POA: Insufficient documentation

## 2012-04-27 DIAGNOSIS — K219 Gastro-esophageal reflux disease without esophagitis: Secondary | ICD-10-CM

## 2012-04-27 DIAGNOSIS — K648 Other hemorrhoids: Secondary | ICD-10-CM | POA: Insufficient documentation

## 2012-04-27 DIAGNOSIS — J4489 Other specified chronic obstructive pulmonary disease: Secondary | ICD-10-CM | POA: Insufficient documentation

## 2012-04-27 DIAGNOSIS — K922 Gastrointestinal hemorrhage, unspecified: Secondary | ICD-10-CM

## 2012-04-27 DIAGNOSIS — E876 Hypokalemia: Secondary | ICD-10-CM | POA: Insufficient documentation

## 2012-04-27 HISTORY — DX: Diverticulosis of large intestine without perforation or abscess without bleeding: K57.30

## 2012-04-27 LAB — BASIC METABOLIC PANEL
BUN: 17 mg/dL (ref 6–23)
Potassium: 3.4 mEq/L — ABNORMAL LOW (ref 3.5–5.1)
Sodium: 137 mEq/L (ref 135–145)

## 2012-04-27 LAB — HEPATIC FUNCTION PANEL
ALT: 13 U/L (ref 0–35)
AST: 18 U/L (ref 0–37)
Alkaline Phosphatase: 67 U/L (ref 39–117)
Total Protein: 7.3 g/dL (ref 6.0–8.3)

## 2012-04-27 LAB — CBC WITH DIFFERENTIAL/PLATELET
Basophils Absolute: 0 10*3/uL (ref 0.0–0.1)
Basophils Relative: 0 % (ref 0–1)
Eosinophils Absolute: 0.1 10*3/uL (ref 0.0–0.7)
MCH: 26.5 pg (ref 26.0–34.0)
MCHC: 32.7 g/dL (ref 30.0–36.0)
Monocytes Relative: 7 % (ref 3–12)
Neutrophils Relative %: 57 % (ref 43–77)
Platelets: 183 10*3/uL (ref 150–400)
RDW: 16.1 % — ABNORMAL HIGH (ref 11.5–15.5)

## 2012-04-27 LAB — CBC
HCT: 39.6 % (ref 36.0–46.0)
Platelets: 184 10*3/uL (ref 150–400)
RDW: 16 % — ABNORMAL HIGH (ref 11.5–15.5)
WBC: 8.3 10*3/uL (ref 4.0–10.5)

## 2012-04-27 MED ORDER — AMLODIPINE BESYLATE 10 MG PO TABS
10.0000 mg | ORAL_TABLET | Freq: Every day | ORAL | Status: DC
  Administered 2012-04-28: 10 mg via ORAL
  Filled 2012-04-27 (×2): qty 1

## 2012-04-27 MED ORDER — ONDANSETRON HCL 4 MG/2ML IJ SOLN: 4.0000 mg | Freq: Four times a day (QID) | INTRAMUSCULAR | Status: DC | PRN

## 2012-04-27 MED ORDER — HYDROCODONE-ACETAMINOPHEN 10-325 MG PO TABS
1.0000 | ORAL_TABLET | Freq: Four times a day (QID) | ORAL | Status: DC | PRN
  Administered 2012-04-27 – 2012-04-28 (×2): 1 via ORAL
  Filled 2012-04-27 (×2): qty 1

## 2012-04-27 MED ORDER — DOCUSATE SODIUM 100 MG PO CAPS
100.0000 mg | ORAL_CAPSULE | Freq: Two times a day (BID) | ORAL | Status: DC
  Administered 2012-04-27: 100 mg via ORAL
  Filled 2012-04-27 (×3): qty 1

## 2012-04-27 MED ORDER — SIMVASTATIN 40 MG PO TABS
40.0000 mg | ORAL_TABLET | Freq: Every evening | ORAL | Status: DC
  Filled 2012-04-27 (×2): qty 1

## 2012-04-27 MED ORDER — ONDANSETRON HCL 4 MG PO TABS: 4.0000 mg | ORAL_TABLET | Freq: Four times a day (QID) | ORAL | Status: DC | PRN

## 2012-04-27 MED ORDER — TRAZODONE HCL 100 MG PO TABS
100.0000 mg | ORAL_TABLET | Freq: Every day | ORAL | Status: DC
  Administered 2012-04-27: 100 mg via ORAL
  Filled 2012-04-27 (×2): qty 1

## 2012-04-27 MED ORDER — DOXAZOSIN MESYLATE 4 MG PO TABS
4.0000 mg | ORAL_TABLET | Freq: Every day | ORAL | Status: DC
  Administered 2012-04-27: 4 mg via ORAL
  Filled 2012-04-27 (×3): qty 1

## 2012-04-27 MED ORDER — LUBIPROSTONE 24 MCG PO CAPS
24.0000 ug | ORAL_CAPSULE | Freq: Two times a day (BID) | ORAL | Status: DC
  Administered 2012-04-27: 24 ug via ORAL
  Filled 2012-04-27 (×5): qty 1

## 2012-04-27 MED ORDER — CYCLOBENZAPRINE HCL 10 MG PO TABS
10.0000 mg | ORAL_TABLET | Freq: Three times a day (TID) | ORAL | Status: DC | PRN
  Administered 2012-04-27: 10 mg via ORAL
  Filled 2012-04-27 (×2): qty 1

## 2012-04-27 MED ORDER — ISOSORBIDE DINITRATE 30 MG PO TABS
30.0000 mg | ORAL_TABLET | Freq: Every day | ORAL | Status: DC
  Administered 2012-04-28: 30 mg via ORAL
  Filled 2012-04-27 (×2): qty 1

## 2012-04-27 MED ORDER — NICOTINE 21 MG/24HR TD PT24
21.0000 mg | MEDICATED_PATCH | Freq: Every day | TRANSDERMAL | Status: DC
  Administered 2012-04-27: 21 mg via TRANSDERMAL
  Filled 2012-04-27 (×2): qty 1

## 2012-04-27 NOTE — ED Notes (Signed)
Called 3e for report, rn busy will call back

## 2012-04-27 NOTE — ED Notes (Signed)
Per ems: pt from home, c/o hemorrhoids that began this morning. Reports having bowel movement without pain, noticed blood in stool. Denies hx of hemorrhoids. bp 140/93, pulse 90, respirations 18 even/nonlabored

## 2012-04-27 NOTE — ED Provider Notes (Signed)
History     CSN: [id number]  Arrival date & time 04/27/12  1215   First MD Initiated Contact with Patient 04/27/12 1239      Chief Complaint  Patient presents with  . Hemorrhoids    (Consider location/radiation/quality/duration/timing/severity/associated sxs/prior treatment) HPI  Pt to the ER with complaints of rectal bleeding with concerns for hemorrhoids. She was using the restroom prior to arrival when she began to strain really hard. Her grand daughter helped her wipe and noticed that their was a significant amount of blood in the toilet and still coming from her rectum. She denies a hx of rectal bleeding of hemorrhoids. She denies being on blood thinners. She is not having any pain. In the ED she is continuing to bleed. She is unable to ambulate without a walker due to previous stroke and arthritis. hemodynamically stable at this time.   Past Medical History  Diagnosis Date  . Hypertension   . Arthritis   . Stroke   . Coronary artery disease   . Asthma     Past Surgical History  Procedure Date  . Abdominal hysterectomy   . Coronary stent placement   . I&d extremity 08/18/2011    Procedure: IRRIGATION AND DEBRIDEMENT EXTREMITY;  Surgeon: Sharma Covert, MD;  Location: [facility] OR;  Service: Orthopedics;  Laterality: Right;  I&D Right Long Finger  . Amputation 08/21/2011    Procedure: AMPUTATION DIGIT;  Surgeon: Sharma Covert, MD;  Location: [facility] [facility] OR;  Service: Orthopedics;  Laterality: Right;    No family history on file.  History  Substance Use Topics  . Smoking status: Current Every Day Smoker -- 0.5 packs/day for 36 years    Types: Cigarettes  . Smokeless tobacco: Never Used  . Alcohol Use: 3.6 oz/week    6 Cans of beer per week     Comment: 04/27/12- "amount varies"    OB History    Grav Para Term Preterm Abortions TAB SAB Ect Mult Living                  Review of Systems  Review of Systems  Gen: no weight loss, fevers, chills, night sweats  Neck: no neck  pain  Lungs:No wheezing, coughing or hemoptysis CV: no chest pain, palpitations, dependent edema or orthopnea  Abd: no abdominal pain, nausea, vomiting, + rectal bleeding GU: no dysuria or gross hematuria  MSK:  No abnormalities  Neuro: no headache, no focal neurologic deficits  Skin: no abnormalities Psyche: negative.   Allergies  Penicillins  Home Medications   Current Outpatient Rx  Name  Route  Sig  Dispense  Refill  . AMLODIPINE BESYLATE 10 MG PO TABS   Oral   Take 10 mg by mouth daily.         . CYCLOBENZAPRINE HCL 10 MG PO TABS   Oral   Take 10 mg by mouth 3 (three) times daily as needed. For muscle spasms         . DOCUSATE SODIUM 100 MG PO CAPS   Oral   Take 100 mg by mouth 2 (two) times daily.         Marland Kitchen DOXAZOSIN MESYLATE 4 MG PO TABS   Oral   Take 4 mg by mouth at bedtime.         Marland Kitchen HYDROCODONE-ACETAMINOPHEN 10-325 MG PO TABS   Oral   Take 1 tablet by mouth every 6 (six) hours as needed.         Malachy Mood  DINITRATE 30 MG PO TABS   Oral   Take 30 mg by mouth daily.         . LUBIPROSTONE 24 MCG PO CAPS   Oral   Take 24 mcg by mouth 2 (two) times daily with a meal.         . SIMVASTATIN 40 MG PO TABS   Oral   Take 40 mg by mouth every evening.         Marland Kitchen TRAMADOL HCL 50 MG PO TABS   Oral   Take 50 mg by mouth every 6 (six) hours as needed.         . TRAZODONE HCL 100 MG PO TABS   Oral   Take 100 mg by mouth at bedtime.           BP 175/57  Pulse 78  Temp 98.8 F (37.1 C) (Oral)  Resp 20  SpO2 99%  Physical Exam  Nursing note and vitals reviewed. Constitutional: She appears well-developed and well-nourished. No distress.  HENT:  Head: Normocephalic and atraumatic.  Eyes: Pupils are equal, round, and reactive to light.  Neck: Normal range of motion. Neck supple.  Cardiovascular: Normal rate and regular rhythm.   Pulmonary/Chest: Effort normal.  Abdominal: Soft. There is no tenderness. There is no rebound and no  guarding.  Genitourinary: Rectal exam shows no external hemorrhoid, no internal hemorrhoid, no fissure and no tenderness. Guaiac positive stool.       Gross bright red blood per rectum  Neurological: She is alert.  Skin: Skin is warm and dry.    ED Course  Procedures (including critical care time)  Labs Reviewed  CBC WITH DIFFERENTIAL - Abnormal; Notable for the following:    RDW 16.1 (*)     All other components within normal limits  BASIC METABOLIC PANEL - Abnormal; Notable for the following:    Potassium 3.4 (*)     All other components within normal limits  OCCULT BLOOD, POC DEVICE  HEPATIC FUNCTION PANEL  PROTIME-INR   No results found.   1. GI bleeding       MDM  Pt has never had colonoscopy. PT/INR physiologic. Hemoglobin stable. Liver enzymes WNL. Pt continues to trickle blood. Will consult with GI and admit to medicine.  Clear liquids. Dr Ewing Schlein with GI will see patient tomorrow.  I have consuled with [facility], Dr. Izola Price has agreed to admit and is placing orders for admission.         Dorthula Matas, PA 04/27/12 [phone]

## 2012-04-27 NOTE — ED Notes (Signed)
Pt reports falling x2-3 weeks ago, c/o left leg pain still bothering pt. Requesting to have "leg checked out" while here

## 2012-04-27 NOTE — H&P (Addendum)
[facility] History and Physical  [name] ZOX:[id number] DOB: 06/10/1955 DOA: 04/27/2012  Referring physician: ED physician PCP: No primary provider on file.   Chief Complaint: Blood in stool  HPI:  Pt is 56 yo female who presents to [facility] ED with main concern of sudden onset bright red blood in stool that she first noted one day prior to admission. Pt denies any similar events in the past and is not on any blood thinners. She denies any abdominal or urinary concerns, no chest pain ,no shortness of breath, no other systemic symptoms. Pt reports ambulating with walker as she is unable to support her weight secondary to history of stroke.   Assessment and Plan:  Principal Problem:  *Blood in stool - unclear etiology and possibly related to hemorrhoidal vs diverticular bleed - Hg and Hct have been stable on admission - will obtain repeat CBC as pt is still having rectal bleed, FOBT positive in ED - GI consultation obtained as pt did not have colonoscopy - will continue to follow up on GI recommendation Active Problems:  HYPERTENSION - stable for now - continue home medication regimen  COPD - appears to be clinically compensated at this time - will continue to monitor vitals per floor protocol - oxygen as needed  Code Status: Full Family Communication: Pt at bedside Disposition Plan: Admit to medical floor  Review of Systems:  Constitutional: Negative for fever, chills and malaise/fatigue. Negative for diaphoresis.  HENT: Negative for hearing loss, ear pain, nosebleeds, congestion, sore throat, neck pain, tinnitus and ear discharge.   Eyes: Negative for blurred vision, double vision, photophobia, pain, discharge and redness.  Respiratory: Negative for cough, hemoptysis, sputum production, shortness of breath, wheezing and stridor.   Cardiovascular: Negative for chest pain, palpitations, orthopnea, claudication and leg swelling.  Gastrointestinal: Negative for nausea,  vomiting and abdominal pain. Negative for heartburn, constipation, positive for blood in stool Genitourinary: Negative for dysuria, urgency, frequency, hematuria and flank pain.  Musculoskeletal: Negative for myalgias, back pain, joint pain and falls.  Skin: Negative for itching and rash.  Neurological: Negative for dizziness and weakness. Negative for tingling, tremors, sensory change, speech change, focal weakness, loss of consciousness and headaches.  Endo/Heme/Allergies: Negative for environmental allergies and polydipsia. Does not bruise/bleed easily.  Psychiatric/Behavioral: Negative for suicidal ideas. The patient is not nervous/anxious.      Past Medical History  Diagnosis Date  . Hypertension   . Arthritis   . Stroke   . Coronary artery disease   . Asthma     Past Surgical History  Procedure Date  . Abdominal hysterectomy   . Coronary stent placement   . I&d extremity 08/18/2011    Procedure: IRRIGATION AND DEBRIDEMENT EXTREMITY;  Surgeon: Sharma Covert, MD;  Location: [facility] OR;  Service: Orthopedics;  Laterality: Right;  I&D Right Long Finger  . Amputation 08/21/2011    Procedure: AMPUTATION DIGIT;  Surgeon: Sharma Covert, MD;  Location: [facility] OR;  Service: Orthopedics;  Laterality: Right;    Social History:  reports that she has been smoking Cigarettes.  She has a 18 pack-year smoking history. She has never used smokeless tobacco. She reports that she drinks about 3.6 ounces of alcohol per week. She reports that she does not use illicit drugs.  Allergies  Allergen Reactions  . Penicillins Itching    No cancer or heart disease in family   Medication Sig  amLODipine 10 MG tablet Take 10 mg by mouth daily.  cyclobenzaprine  10  MG tablet Take 10 mg by mouth 3 (three) times daily as needed.   docusate sodium  100 MG capsule Take 100 mg by mouth 2 (two) times daily.  doxazosin (CARDURA) 4 MG tablet Take 4 mg by mouth at bedtime.  HYDROcodone-acetaminophen  Take 1 tablet by  mouth every 6 (six) hours as needed.  isosorbide dinitrate  30 MG tablet Take 30 mg by mouth daily.  lubiprostone 24 MCG capsule Take 24 mcg by mouth 2 (two) times daily with a meal.  simvastatin (ZOCOR) 40 MG tablet Take 40 mg by mouth every evening.  traMADol (ULTRAM) 50 MG tablet Take 50 mg by mouth every 6 (six) hours as needed.  traZODone  100 MG tablet Take 100 mg by mouth at bedtime.    Physical Exam: Filed Vitals:   04/27/12 1217 04/27/12 1603  BP: 170/65 175/57  Pulse: 82 78  Temp: 98.6 F (37 C) 98.8 F (37.1 C)  TempSrc:  Oral  Resp: 20 20  SpO2: 97% 99%    Physical Exam  Constitutional: Appears well-developed and well-nourished. No distress.  HENT: Normocephalic. External right and left ear normal. Oropharynx is clear and moist.  Eyes: Conjunctivae and EOM are normal. PERRLA, no scleral icterus.  Neck: Normal ROM. Neck supple. No JVD. No tracheal deviation. No thyromegaly.  CVS: RRR, S1/S2 +, no murmurs, no gallops, no carotid bruit.  Pulmonary: Effort and breath sounds normal, no stridor, rhonchi, wheezes, rales.  Abdominal: Soft. BS +,  no distension, tenderness, rebound or guarding.  Musculoskeletal: Normal range of motion. No edema and no tenderness.  Lymphadenopathy: No lymphadenopathy noted, cervical, inguinal. Neuro: Alert. Normal reflexes, muscle tone coordination. No cranial nerve deficit. Skin: Skin is warm and dry. No rash noted. Not diaphoretic. No erythema. No pallor.  Psychiatric: Normal mood and affect. Behavior, judgment, thought content normal.   Labs on Admission:  Basic Metabolic Panel:  Lab 04/27/12 4782  NA 137  K 3.4*  CL 101  CO2 27  GLUCOSE 85  BUN 17  CREATININE 0.74  CALCIUM 9.4  MG --  PHOS --   Liver Function Tests:  Lab 04/27/12 1315  AST 18  ALT 13  ALKPHOS 67  BILITOT 0.3  PROT 7.3  ALBUMIN 3.7   CBC:  Lab 04/27/12 1315  WBC 7.6  NEUTROABS 4.3  HGB 12.7  HCT 38.8  MCV 80.8  PLT 183   Radiological Exams on  Admission: No results found.  EKG: Normal sinus rhythm, no ST/T wave changes  Debbora Presto, MD  [facility] Pager [phone]  If 7PM-7AM, please contact night-coverage [email or web address] Password [facility] 04/27/2012, 4:37 PM

## 2012-04-28 ENCOUNTER — Encounter (HOSPITAL_COMMUNITY): Payer: Self-pay

## 2012-04-28 ENCOUNTER — Encounter (HOSPITAL_COMMUNITY): Payer: Self-pay | Admitting: Gastroenterology

## 2012-04-28 ENCOUNTER — Encounter (HOSPITAL_COMMUNITY)
Admission: EM | Disposition: A | Discharge status: Home / Self Care | Payer: Self-pay | Source: Home / Self Care | Attending: Emergency Medicine

## 2012-04-28 DIAGNOSIS — K921 Melena: Secondary | ICD-10-CM | POA: Diagnosis present

## 2012-04-28 DIAGNOSIS — I10 Essential (primary) hypertension: Secondary | ICD-10-CM

## 2012-04-28 DIAGNOSIS — E876 Hypokalemia: Secondary | ICD-10-CM | POA: Diagnosis not present

## 2012-04-28 HISTORY — PX: FLEXIBLE SIGMOIDOSCOPY: SHX5431

## 2012-04-28 LAB — TSH: TSH: 0.978 u[IU]/mL (ref 0.350–4.500)

## 2012-04-28 LAB — BASIC METABOLIC PANEL
BUN: 14 mg/dL (ref 6–23)
Chloride: 102 mEq/L (ref 96–112)
GFR calc Af Amer: >90 mL/min (ref >90)
Potassium: 3 mEq/L — ABNORMAL LOW (ref 3.5–5.1)

## 2012-04-28 LAB — CBC
HCT: 38.5 % (ref 36.0–46.0)
Hemoglobin: 12.5 g/dL (ref 12.0–15.0)
WBC: 8.7 10*3/uL (ref 4.0–10.5)

## 2012-04-28 SURGERY — SIGMOIDOSCOPY, FLEXIBLE

## 2012-04-28 MED ORDER — HYDRALAZINE HCL 20 MG/ML IJ SOLN: 10.0000 mg | Freq: Four times a day (QID) | INTRAMUSCULAR | Status: DC | PRN

## 2012-04-28 MED ORDER — TRAMADOL HCL 50 MG PO TABS: 50.0000 mg | ORAL_TABLET | Freq: Four times a day (QID) | ORAL | Status: DC | PRN

## 2012-04-28 MED ORDER — PHENYLEPHRINE IN HARD FAT 0.25 % RE SUPP: 1.0000 | Freq: Two times a day (BID) | RECTAL | Status: DC

## 2012-04-28 MED ORDER — POTASSIUM CHLORIDE ER 10 MEQ PO TBCR: 20.0000 meq | EXTENDED_RELEASE_TABLET | Freq: Two times a day (BID) | ORAL | Status: DC

## 2012-04-28 MED ORDER — POTASSIUM CHLORIDE 10 MEQ/100ML IV SOLN
10.0000 meq | INTRAVENOUS | Status: DC
  Filled 2012-04-28 (×4): qty 100

## 2012-04-28 MED ORDER — POTASSIUM CHLORIDE CRYS ER 20 MEQ PO TBCR
40.0000 meq | EXTENDED_RELEASE_TABLET | Freq: Once | ORAL | Status: AC
  Administered 2012-04-28: 40 meq via ORAL
  Filled 2012-04-28: qty 2

## 2012-04-28 NOTE — Op Note (Signed)
[facility] [address] [place], [address]   FLEXIBLE SIGMOIDOSCOPY PROCEDURE REPORT  PATIENT: [name], [name]  MR#: [id number] BIRTHDATE: 1956/04/18 , 56  yrs. old GENDER: Female ENDOSCOPIST: Vida Rigger, MD REFERRED BY: PROCEDURE DATE:  04/28/2012 PROCEDURE:   Sigmoidoscopy, diagnostic ASA CLASS:   Class III INDICATIONS:hematochezia. MEDICATIONS: None  DESCRIPTION OF PROCEDURE:   After the risks benefits and alternatives of the procedure were thoroughly explained, informed consent was obtained.  revealed external hemorrhoids. The Colonoscope [id number]  endoscope was introduced through the anus  and advanced to the sigmoid colonTo 35 cm , limited by No adverse events experienced.   The quality of the prep was adequateUntil at 35 cm formed stool was seen and no signs of colon bleeding .  The instrument was then slowly withdrawn as the mucosa was fully examined.     1.  COLON FINDINGS: Internal and external hemorrhoids were found. 2.  Mild diverticulosis was noted in the sigmoid colon. 3.  The colon mucosa was otherwise normal. Retroflexed views revealed internal hemorrhoid.    The scope was then withdrawn from the patient and the procedure terminated.  COMPLICATIONS: There were no complications.  ENDOSCOPIC IMPRESSION: 1.   Internal and external hemorrhoids 2.   Mild diverticulosis was noted in the sigmoid colon 3.   The colon mucosa was otherwise normal To 35 cm without signs of bleeding  RECOMMENDATIONS:Treat hemorrhoids with creams or suppositories okay with me to go home and followup with primary care physician or consider surgical therapy on hemorrhoids and please let us know if we could be of any further assistance   REPEAT EXAM:    As needed   _______________________________ eSigned:  Vida Rigger, MD 04/28/2012 11:53 AM   CC:  PATIENT NAME:  Ara, Tanouye MR#: [id number]

## 2012-04-28 NOTE — Consult Note (Signed)
Referring Provider: Dr. Danie Binder Primary Care Physician:  Unknown (goes to clinic on [facility]) Primary Gastroenterologist:  None  Reason for Consultation:  Rectal bleeding  HPI: [name] is a 56 y.o. female admitted to the hospital yesterday because of a solitary episode of small volume hematochezia, characterized by blood on the toilet paper, following a bowel movement yesterday when she was straining severely at stool, do to constipation. Ordinarily, the patient keeps her bowels moving fairly well by the daily consumption of yogurt, but she had not had it recently and had become somewhat constipated.  Since admission, she has had no further bleeding. Her hemoglobin is stable and is quite close to her baseline from 4 years ago.  This patient apparently suffered a stroke many years ago, but did not have significant residual from it. Nonetheless, she is morbidly obese and essentially bedbound and home, cared for by her daughter, getting out just occasionally to go to the store work to pay bills, do to severe limitation of mobility, apparently related to severe DJD of the knees. She can make it to the bathroom with the help of a walker, or make it around her apartment by use of a scooter.  Her past GI history is pertinent for diverticulitis with a diverticular abscess by CT scan about 4 years ago.   Past Medical History  Diagnosis Date  . Hypertension   . Arthritis   . Stroke   . Coronary artery disease   . Asthma        Past Surgical History  Procedure Date  . Abdominal hysterectomy   . Coronary stent placement   . I&d extremity 08/18/2011    Procedure: IRRIGATION AND DEBRIDEMENT EXTREMITY;  Surgeon: Sharma Covert, MD;  Location: [facility] OR;  Service: Orthopedics;  Laterality: Right;  I&D Right Long Finger  . Amputation 08/21/2011    Procedure: AMPUTATION DIGIT;  Surgeon: Sharma Covert, MD;  Location: [facility] OR;  Service: Orthopedics;  Laterality: Right;    Prior to  Admission medications   Medication Sig Start Date End Date Taking? Authorizing Provider  amLODipine (NORVASC) 10 MG tablet Take 10 mg by mouth daily.   Yes Historical Provider, MD  cyclobenzaprine (FLEXERIL) 10 MG tablet Take 10 mg by mouth 3 (three) times daily as needed. For muscle spasms   Yes Historical Provider, MD  docusate sodium (COLACE) 100 MG capsule Take 100 mg by mouth 2 (two) times daily.   Yes Historical Provider, MD  doxazosin (CARDURA) 4 MG tablet Take 4 mg by mouth at bedtime.   Yes Historical Provider, MD  HYDROcodone-acetaminophen (NORCO) 10-325 MG per tablet Take 1 tablet by mouth every 6 (six) hours as needed.   Yes Historical Provider, MD  isosorbide dinitrate (ISORDIL) 30 MG tablet Take 30 mg by mouth daily.   Yes Historical Provider, MD  lubiprostone (AMITIZA) 24 MCG capsule Take 24 mcg by mouth 2 (two) times daily with a meal.   Yes Historical Provider, MD  simvastatin (ZOCOR) 40 MG tablet Take 40 mg by mouth every evening.   Yes Historical Provider, MD  traMADol (ULTRAM) 50 MG tablet Take 50 mg by mouth every 6 (six) hours as needed.   Yes Historical Provider, MD  traZODone (DESYREL) 100 MG tablet Take 100 mg by mouth at bedtime.   Yes Historical Provider, MD    Current Facility-Administered Medications  Medication Dose Route Frequency Provider Last Rate Last Dose  . amLODipine (NORVASC) tablet 10 mg  10  mg Oral Daily Dorothea Ogle, MD      . cyclobenzaprine (FLEXERIL) tablet 10 mg  10 mg Oral TID PRN Dorothea Ogle, MD   10 mg at 04/27/12 2212  . docusate sodium (COLACE) capsule 100 mg  100 mg Oral BID Dorothea Ogle, MD   100 mg at 04/27/12 2212  . doxazosin (CARDURA) tablet 4 mg  4 mg Oral QHS Dorothea Ogle, MD   4 mg at 04/27/12 2212  . HYDROcodone-acetaminophen (NORCO) 10-325 MG per tablet 1 tablet  1 tablet Oral Q6H PRN Dorothea Ogle, MD   1 tablet at 04/28/12 0242  . isosorbide dinitrate (ISORDIL) tablet 30 mg  30 mg Oral Daily Dorothea Ogle, MD      .  lubiprostone (AMITIZA) capsule 24 mcg  24 mcg Oral BID WC Dorothea Ogle, MD   24 mcg at 04/27/12 1748  . nicotine (NICODERM CQ - dosed in mg/24 hours) patch 21 mg  21 mg Transdermal Daily Dorothea Ogle, MD   21 mg at 04/27/12 2027  . ondansetron (ZOFRAN) tablet 4 mg  4 mg Oral Q6H PRN Dorothea Ogle, MD       Or  . ondansetron [facility]) injection 4 mg  4 mg Intravenous Q6H PRN Dorothea Ogle, MD      . simvastatin (ZOCOR) tablet 40 mg  40 mg Oral QPM Dorothea Ogle, MD      . traMADol Janean Sark) tablet 50 mg  50 mg Oral Q6H PRN Roma Kayser Schorr, NP      . traZODone (DESYREL) tablet 100 mg  100 mg Oral QHS Dorothea Ogle, MD   100 mg at 04/27/12 2212    Allergies as of 04/27/2012 - Review Complete 04/27/2012  Allergen Reaction Noted  . Penicillins Itching     No family history on file.  History   Social History  . Marital Status: Legally Separated    Spouse Name: N/A    Number of Children: N/A  . Years of Education: N/A   Occupational History  . Not on file.   Social History Main Topics  . Smoking status: Current Every Day Smoker -- 0.5 packs/day for 36 years    Types: Cigarettes  . Smokeless tobacco: Never Used  . Alcohol Use: 3.6 oz/week    6 Cans of beer per week     Comment: 04/27/12- "amount varies"  . Drug Use: No  . Sexually Active:    Other Topics Concern  . Not on file   Social History Narrative  . No narrative on file    Review of Systems: See history of present illness   Physical Exam:  This is a somnolent morbidly obese African American female in no distress whatsoever lying in bed. She is only able to turn over in bed with great difficulty and with the assistance. No frank pallor. Chest clear. Heart without murmurs or arrhythmias. Abdomen massively obese, but without obvious mass effect or tenderness with particular reference to the left lower quadrant. Perianal exam shows an excoriated prolapsed hemorrhoid with a laceration an adherent blood. Digital shows  normal sphincter tone and an empty rectal ampulla, no stool present. There is a thin coating of sanguinous pink fluid on the examining finger.   Vital signs in last 24 hours: Temp:  [98.2 F (36.8 C)-98.8 F (37.1 C)] 98.5 F (36.9 C) (11/20 0510) Pulse Rate:  [75-88] 76  (11/20 0510) Resp:  [14-20] 14  (11/20 0510)  BP: (152-204)/(57-119) 152/63 mmHg (11/20 0510) SpO2:  [94 %-100 %] 100 % (11/20 0510) Weight:  [123.832 kg (273 lb)] 123.832 kg (273 lb) (11/19 1837) Last BM Date: 04/27/12 ct.  Intake/Output from previous day: 11/19 0701 - 11/20 0700 In: 620 [P.O.:620] Out: -  Intake/Output this shift:    Lab Results:  Basename 04/28/12 0400 04/27/12 1715 04/27/12 1315  WBC 8.7 8.3 7.6  HGB 12.5 12.8 12.7  HCT 38.5 39.6 38.8  PLT 185 184 183   BMET  Basename 04/28/12 0400 04/27/12 1315  NA 138 137  K 3.0* 3.4*  CL 102 101  CO2 28 27  GLUCOSE 99 85  BUN 14 17  CREATININE 0.72 0.74  CALCIUM 9.2 9.4   LFT  Basename 04/27/12 1315  PROT 7.3  ALBUMIN 3.7  AST 18  ALT 13  ALKPHOS 67  BILITOT 0.3  BILIDIR <0.1  IBILI NOT CALCULATED   PT/INR  Basename 04/27/12 1425  LABPROT 12.7  INR 0.96    Studies/Results: No results found.  Impression: 1. Hemorrhoidal bleeding , currently quiescent  Plan: Unprepped flexible sigmoidoscopy today. Ashby Dawes, purpose, and risks reviewed with patient and daughter.  Depending on the findings of that examination, further evaluation may or may not be clinically appropriate. I discussed with the patient and her daughter the option of a screening colonoscopy, but given this patient's significant comorbidities, I think she would be at substantial risk for complications with attempt at colonoscopy, primarily as it relates to sedation, and it is not clear to me that the benefits of the screening exam would outweigh the risk. Accordingly, unless there is a recent identified on today's sigmoidoscopy to do a more extensive examination, I  would not recommend routine screening in this particular patient, and the patient and the daughter are comfortable with this approach.     LOS: 1 day   Joslyne Marshburn V  04/28/2012, 9:52 AM

## 2012-04-28 NOTE — Discharge Summary (Signed)
[facility]  Physician Discharge Summary   Patient ID: [name] MRN: [id number] DOB/AGE: April 18, 1956 56 y.o.  Admit date: 04/27/2012 Discharge date: 04/28/2012  PCP: Erlinda Hong, MD  DISCHARGE DIAGNOSES:  Principal Problem:  *Hematochezia Active Problems:  HYPERTENSION  COPD  Hypokalemia   RECOMMENDATIONS FOR OUTPATIENT FOLLOW UP: 1. Needs follow up for hemorrhoidal bleeding. May need referral to surgery if bleeding persists  DISCHARGE CONDITION: fair  Diet recommendation: Low Sodium  Filed Weights   04/27/12 1837  Weight: 123.832 kg (273 lb)    INITIAL HISTORY: Pt is 56 yo female who presented to [facility] ED with main concern of sudden onset bright red blood in stool that she first noted one day prior to admission. Pt denied any similar events in the past and was not on any blood thinners. She denied any abdominal or urinary concerns, no chest pain ,no shortness of breath, no other systemic symptoms. Pt reported ambulating with walker as she is unable to support her weight secondary to history of stroke.   Consultations:  [facility]  Procedures:  Flexible Sigmoidoscopy  HOSPITAL COURSE:   Hematochezia secondary to hemorrhoids Patient was seen by GI and under went Flex Sig today. This revealed internal hemorrhoids and a few diverticula. Prep H supp will be prescribed as recommended. Follow up with PCP. Patient doesn't have active bleeding currently. Her hemoglobin remains stable.   HYPERTENSION  BP was poorly controlled overnight. Resume home medication regimen. PCP may need to further adjust her medications.    History of COPD  Appears to be clinically compensated at this time.   Hypokalemia  We will prescribe her KCL.   Overall patient remains stable. She has blood only when she wipes. No pain. She is stable for discharge.  PERTINENT LABS:  The results of significant diagnostics from this hospitalization (including imaging, microbiology,  ancillary and laboratory) are listed below for reference.    Microbiology: No results found for this or any previous visit (from the past 240 hour(s)).   Labs: Basic Metabolic Panel:  Lab 04/28/12 2130 04/27/12 1715 04/27/12 1315  NA 138 -- 137  K 3.0* -- 3.4*  CL 102 -- 101  CO2 28 -- 27  GLUCOSE 99 -- 85  BUN 14 -- 17  CREATININE 0.72 -- 0.74  CALCIUM 9.2 -- 9.4  MG 2.2 2.2 --  PHOS -- 3.3 --   Liver Function Tests:  Lab 04/27/12 1315  AST 18  ALT 13  ALKPHOS 67  BILITOT 0.3  PROT 7.3  ALBUMIN 3.7   No results found for this basename: LIPASE:5,AMYLASE:5 in the last 168 hours No results found for this basename: AMMONIA:5 in the last 168 hours CBC:  Lab 04/28/12 0400 04/27/12 1715 04/27/12 1315  WBC 8.7 8.3 7.6  NEUTROABS -- -- 4.3  HGB 12.5 12.8 12.7  HCT 38.5 39.6 38.8  MCV 80.9 80.5 80.8  PLT 185 184 183   Cardiac Enzymes: No results found for this basename: CKTOTAL:5,CKMB:5,CKMBINDEX:5,TROPONINI:5 in the last 168 hours BNP: BNP (last 3 results) No results found for this basename: PROBNP:3 in the last 8760 hours CBG: No results found for this basename: GLUCAP:5 in the last 168 hours   IMAGING STUDIES No results found.  DISCHARGE EXAMINATION: See progress note from earlier today.  DISPOSITION: Home with Daughter  Discharge Orders    Future Orders Please Complete By Expires   Diet - low sodium heart healthy      Increase activity slowly  Discharge instructions      Comments:   Follow up with your PCP for further issues with bleeding     Current Discharge Medication List    START taking these medications   Details  phenylephrine (,USE FOR PREPARATION-H,) 0.25 % suppository Place 1 suppository rectally 2 (two) times daily. Qty: 20 suppository, Refills: 1    potassium chloride (K-DUR) 10 MEQ tablet Take 2 tablets (20 mEq total) by mouth 2 (two) times daily. Qty: 12 tablet, Refills: 0      CONTINUE these medications which have NOT  CHANGED   Details  amLODipine (NORVASC) 10 MG tablet Take 10 mg by mouth daily.    cyclobenzaprine (FLEXERIL) 10 MG tablet Take 10 mg by mouth 3 (three) times daily as needed. For muscle spasms    docusate sodium (COLACE) 100 MG capsule Take 100 mg by mouth 2 (two) times daily.    doxazosin (CARDURA) 4 MG tablet Take 4 mg by mouth at bedtime.    HYDROcodone-acetaminophen (NORCO) 10-325 MG per tablet Take 1 tablet by mouth every 6 (six) hours as needed.    isosorbide dinitrate (ISORDIL) 30 MG tablet Take 30 mg by mouth daily.    lubiprostone (AMITIZA) 24 MCG capsule Take 24 mcg by mouth 2 (two) times daily with a meal.    simvastatin (ZOCOR) 40 MG tablet Take 40 mg by mouth every evening.    traMADol (ULTRAM) 50 MG tablet Take 50 mg by mouth every 6 (six) hours as needed.    traZODone (DESYREL) 100 MG tablet Take 100 mg by mouth at bedtime.       Follow-up Information    Follow up with [facility], MD. Schedule an appointment as soon as possible for a visit in 1 week. (post hopsitalization follow up)    Contact information:   [address] [place] [address] [phone]          TOTAL DISCHARGE TIME: 35 mins  [facility]  [facility] Pager [phone]  04/28/2012, 1:10 PM

## 2012-04-28 NOTE — Progress Notes (Signed)
[facility] PROGRESS NOTE  [name] ZOX:[id number] DOB: 02/13/1956 DOA: 04/27/2012  PCP: Erlinda Hong, MD  Brief HPI: Pt is 56 yo female who presented to [facility] ED with main concern of sudden onset bright red blood in stool that she first noted one day prior to admission. Pt denied any similar events in the past and was not on any blood thinners. She denied any abdominal or urinary concerns, no chest pain ,no shortness of breath, no other systemic symptoms. Pt reported ambulating with walker as she is unable to support her weight secondary to history of stroke.   Past medical history:  Past Medical History  Diagnosis Date  . Hypertension   . Arthritis   . Stroke   . Coronary artery disease   . Asthma   . Diverticular disease of large intestine     diverticular abscess in sigmoid region by CT 2009    Consultants: [facility]  Procedures: Flex Sig 11/20  Antibiotics: None  Subjective: Patient had blood in stool today. Denies any abdominal pain. No nausea or vomiting.  Objective: Vital Signs  Filed Vitals:   04/27/12 2114 04/28/12 0510 04/28/12 1049 04/28/12 1105  BP: 180/63 152/63 164/78 151/97  Pulse: 88 76    Temp: 98.2 F (36.8 C) 98.5 F (36.9 C) 99.3 F (37.4 C)   TempSrc: Oral Oral Oral   Resp: 19 14 24 25   Height:      Weight:      SpO2: 94% 100% 95% 100%    Intake/Output Summary (Last 24 hours) at 04/28/12 1106 Last data filed at 04/28/12 0900  Gross per 24 hour  Intake    980 ml  Output      0 ml  Net    980 ml   Filed Weights   04/27/12 1837  Weight: 123.832 kg (273 lb)    Intake/Output from previous day: 11/19 0701 - 11/20 0700 In: 620 [P.O.:620] Out: -   General appearance: alert, cooperative, appears stated age, no distress and moderately obese Head: Normocephalic, without obvious abnormality, atraumatic Eyes: conjunctivae/corneas clear. PERRL, EOM's intact.  Resp: clear to auscultation bilaterally Cardio: regular rate and  rhythm, S1, S2 normal, no murmur, click, rub or gallop GI: soft, non-tender; bowel sounds normal; no masses,  no organomegaly Extremities: extremities normal, atraumatic, no cyanosis or edema Pulses: 2+ and symmetric Neurologic: Alert and oriented x 3. Normal speech.  Lab Results:  Basic Metabolic Panel:  Lab 04/28/12 8119 04/27/12 1715 04/27/12 1315  NA 138 -- 137  K 3.0* -- 3.4*  CL 102 -- 101  CO2 28 -- 27  GLUCOSE 99 -- 85  BUN 14 -- 17  CREATININE 0.72 -- 0.74  CALCIUM 9.2 -- 9.4  MG -- 2.2 --  PHOS -- 3.3 --   Liver Function Tests:  Lab 04/27/12 1315  AST 18  ALT 13  ALKPHOS 67  BILITOT 0.3  PROT 7.3  ALBUMIN 3.7   No results found for this basename: LIPASE:5,AMYLASE:5 in the last 168 hours No results found for this basename: AMMONIA:5 in the last 168 hours CBC:  Lab 04/28/12 0400 04/27/12 1715 04/27/12 1315  WBC 8.7 8.3 7.6  NEUTROABS -- -- 4.3  HGB 12.5 12.8 12.7  HCT 38.5 39.6 38.8  MCV 80.9 80.5 80.8  PLT 185 184 183   Cardiac Enzymes: No results found for this basename: CKTOTAL:5,CKMB:5,CKMBINDEX:5,TROPONINI:5 in the last 168 hours BNP (last 3 results) No results found for this basename: PROBNP:3 in the last 8760  hours CBG: No results found for this basename: GLUCAP:5 in the last 168 hours  No results found for this or any previous visit (from the past 240 hour(s)).    Studies/Results: No results found.  Medications:  Scheduled:    . amLODipine  10 mg Oral Daily  . docusate sodium  100 mg Oral BID  . doxazosin  4 mg Oral QHS  . isosorbide dinitrate  30 mg Oral Daily  . lubiprostone  24 mcg Oral BID WC  . nicotine  21 mg Transdermal Daily  . simvastatin  40 mg Oral QPM  . traZODone  100 mg Oral QHS   Continuous:  ZOX:[id number], HYDROcodone-acetaminophen, ondansetron (ZOFRAN) IV, ondansetron, traMADol  Assessment/Plan:  Principal Problem:  *Hematochezia Active Problems:  HYPERTENSION  COPD    Hematochezia Unclear  etiology and possibly related to hemorrhoidal vs diverticular bleed. For flexible sigmoidoscopy today. Appreciate GI input. Hgb remains stable.  HYPERTENSION  BP was poorly controlled overnight. Patient does not know her medications. Currently on Amlodipine and Isordil. Also on Doxazosin.  Continue to monitor BP and adjust meds as needed.  History of COPD  Appears to be clinically compensated at this time.   Hypokalemia Replete. Check Mg  DVT Prophylaxis SCD's  Code Status: Full Code  Family Communication: Daughter at bedside Disposition Plan: Will likely return home when better    LOS: 1 day   [facility]  [facility] Pager [phone] 04/28/2012, 11:06 AM  If 8PM-8AM, please contact night-coverage at [email or web address], password [facility]

## 2012-04-28 NOTE — Progress Notes (Signed)
Flexible sigmoidoscopy was done without any problems and did not show any active bleeding in the colon and only showed her hemorrhoid and formed stool at 35 cm and a few diverticula Plan: Treat hemorrhoids with either creams or suppository okay with me to go home and followup with primary careOr consider surgicalOptions on hemorrhoid

## 2012-04-28 NOTE — ED Provider Notes (Signed)
Medical screening examination/treatment/procedure(s) were performed by non-physician practitioner and as supervising physician I was immediately available for consultation/collaboration.   Rolan Bucco, MD 04/28/12 1820

## 2012-04-29 ENCOUNTER — Encounter (HOSPITAL_COMMUNITY): Payer: Self-pay | Admitting: Gastroenterology

## 2013-12-05 ENCOUNTER — Encounter (HOSPITAL_COMMUNITY): Payer: Self-pay | Admitting: Emergency Medicine

## 2013-12-05 ENCOUNTER — Emergency Department (INDEPENDENT_AMBULATORY_CARE_PROVIDER_SITE_OTHER)
Admission: EM | Admit: 2013-12-05 | Discharge: 2013-12-05 | Disposition: A | Discharge status: Home / Self Care | Payer: Medicaid Other | Source: Home / Self Care

## 2013-12-05 ENCOUNTER — Emergency Department (INDEPENDENT_AMBULATORY_CARE_PROVIDER_SITE_OTHER): Payer: Medicaid Other

## 2013-12-05 ENCOUNTER — Emergency Department (HOSPITAL_COMMUNITY): Payer: Medicaid Other

## 2013-12-05 DIAGNOSIS — M25562 Pain in left knee: Principal | ICD-10-CM

## 2013-12-05 DIAGNOSIS — M25561 Pain in right knee: Secondary | ICD-10-CM

## 2013-12-05 DIAGNOSIS — M25569 Pain in unspecified knee: Secondary | ICD-10-CM

## 2013-12-05 MED ORDER — KETOROLAC TROMETHAMINE 60 MG/2ML IM SOLN
INTRAMUSCULAR | Status: AC
  Filled 2013-12-05: qty 2

## 2013-12-05 MED ORDER — KETOROLAC TROMETHAMINE 60 MG/2ML IM SOLN
60.0000 mg | Freq: Once | INTRAMUSCULAR | Status: AC
  Administered 2013-12-05: 60 mg via INTRAMUSCULAR

## 2013-12-05 NOTE — ED Provider Notes (Signed)
CSN: [id number]     Arrival date & time 12/05/13  1616 History   None    Chief Complaint  Patient presents with  . Knee Pain   (Consider location/radiation/quality/duration/timing/severity/associated sxs/prior Treatment)  HPI  The patient is a 58 year old female presenting today after a fall out of her bed yesterday.  The patient states she required an ambulance to come assist her back off the floor. Patient states she refused a ride to the hospital as she "didn't want to pay for a $500 ride". Patient states that since the fall she has had bilateral knee pain and has had difficulty walking secondary to discomfort.  The patient normally ambulates with the assistance of a walker.  The home health aid who visited her today stated she needed to be seen to rule out a "blood clot" due to extreme discomfort to knees. The patient's daughter is present here with her today stating that her knees typically her issues walking "bone on bone for which she takes "Vicodins".  Past Medical History  Diagnosis Date  . Hypertension   . Arthritis   . Stroke   . Coronary artery disease   . Asthma   . Diverticular disease of large intestine     diverticular abscess in sigmoid region by CT 2009   Past Surgical History  Procedure Laterality Date  . Abdominal hysterectomy    . Coronary stent placement    . I&d extremity  08/18/2011    Procedure: IRRIGATION AND DEBRIDEMENT EXTREMITY;  Surgeon: Sharma Covert, MD;  Location: [facility] OR;  Service: Orthopedics;  Laterality: Right;  I&D Right Long Finger  . Amputation  08/21/2011    Procedure: AMPUTATION DIGIT;  Surgeon: Sharma Covert, MD;  Location: [facility] OR;  Service: Orthopedics;  Laterality: Right;  . Flexible sigmoidoscopy  04/28/2012    Procedure: FLEXIBLE SIGMOIDOSCOPY;  Surgeon: Petra Kuba, MD;  Location: [facility]COPY;  Service: Endoscopy;  Laterality: N/A;  unsedated, unprepped   No family history on file. History  Substance Use Topics  . Smoking status:  Current Every Day Smoker -- 0.50 packs/day for 36 years    Types: Cigarettes  . Smokeless tobacco: Never Used  . Alcohol Use: 3.6 oz/week    6 Cans of beer per week     Comment: 04/27/12- "amount varies"   OB History   Grav Para Term Preterm Abortions TAB SAB Ect Mult Living                 Review of Systems  Constitutional: Negative.  Negative for fever, chills and fatigue.  HENT: Negative.   Eyes: Negative.   Respiratory: Negative.  Negative for cough, choking, chest tightness and shortness of breath.   Cardiovascular: Negative.  Negative for chest pain, palpitations and leg swelling.  Gastrointestinal: Negative.  Negative for nausea, vomiting and diarrhea.  Endocrine: Negative.   Genitourinary: Negative.   Musculoskeletal: Positive for gait problem.       The patient reports bilateral knee swelling.   Skin: Negative.  Negative for color change, pallor and rash.  Allergic/Immunologic: Negative.   Neurological: Negative.  Negative for dizziness and weakness.  Hematological: Negative.   Psychiatric/Behavioral: Negative.     Allergies  Penicillins  Home Medications   Prior to Admission medications   Medication Sig Start Date End Date Taking? Authorizing Provider  amLODipine (NORVASC) 10 MG tablet Take 10 mg by mouth daily.   Yes Historical Provider, MD  isosorbide dinitrate (ISORDIL) 30 MG tablet Take 30 mg  by mouth daily.   Yes Historical Provider, MD  potassium chloride (K-DUR) 10 MEQ tablet Take 2 tablets (20 mEq total) by mouth 2 (two) times daily. 04/28/12  Yes Osvaldo ShipperGokul Krishnan, MD  simvastatin (ZOCOR) 40 MG tablet Take 40 mg by mouth every evening.   Yes Historical Provider, MD  cyclobenzaprine (FLEXERIL) 10 MG tablet Take 10 mg by mouth 3 (three) times daily as needed. For muscle spasms    Historical Provider, MD  docusate sodium (COLACE) 100 MG capsule Take 100 mg by mouth 2 (two) times daily.    Historical Provider, MD  doxazosin (CARDURA) 4 MG tablet Take 4 mg by  mouth at bedtime.    Historical Provider, MD  HYDROcodone-acetaminophen (NORCO) 10-325 MG per tablet Take 1 tablet by mouth every 6 (six) hours as needed.    Historical Provider, MD  lubiprostone (AMITIZA) 24 MCG capsule Take 24 mcg by mouth 2 (two) times daily with a meal.    Historical Provider, MD  phenylephrine (,USE FOR PREPARATION-H,) 0.25 % suppository Place 1 suppository rectally 2 (two) times daily. 04/28/12   Osvaldo ShipperGokul Krishnan, MD  traMADol (ULTRAM) 50 MG tablet Take 50 mg by mouth every 6 (six) hours as needed.    Historical Provider, MD  traZODone (DESYREL) 100 MG tablet Take 100 mg by mouth at bedtime.    Historical Provider, MD   BP 171/72  Pulse 80  Temp(Src) 98.4 F (36.9 C)  Resp 16  SpO2 100%  Physical Exam  Nursing note and vitals reviewed. Constitutional: She is oriented to person, place, and time. She appears well-developed and well-nourished. No distress.  Cardiovascular: Normal rate, regular rhythm, normal heart sounds and intact distal pulses.  Exam reveals no gallop and no friction rub.   No murmur heard. Pulmonary/Chest: Breath sounds normal. No respiratory distress. She has no wheezes. She has no rales. She exhibits no tenderness.  Examination limited by body habitus.  Musculoskeletal: She exhibits tenderness.       Right knee: She exhibits bony tenderness. She exhibits normal range of motion, no ecchymosis, no deformity, no laceration, no erythema, normal alignment, no LCL laxity and normal patellar mobility. Tenderness found.       Left knee: She exhibits decreased range of motion and bony tenderness. She exhibits no ecchymosis, no deformity, no laceration, no erythema, normal alignment, no LCL laxity and normal patellar mobility. Tenderness found.  Secondary to body habitus and extreme morbid obesity, unable to evaluate for swelling or effusion. No warmth noted upon examination. Active and passive range of motion is limited secondary to patient reports of  discomfort.   Neurological: She is alert and oriented to person, place, and time.  Skin: Skin is warm and dry. No rash noted. She is not diaphoretic. No erythema. No pallor.    ED Course  Procedures (including critical care time) Labs Review Labs Reviewed - No data to display  Imaging Review Dg Knee Complete 4 Views Left  12/05/2013   CLINICAL DATA:  Larey SeatFell yesterday and injured both knees, symmetric bilateral anterior knee pain.  EXAM: LEFT KNEE - COMPLETE 4+ VIEW  COMPARISON:  02/05/2012.  FINDINGS: No evidence of acute fracture or dislocation. Moderate to severe tricompartment joint space narrowing, greatest in the medial compartment, progressive since the prior examination. Mild osseous demineralization. Small joint effusion.  IMPRESSION: 1. No acute osseous abnormality. 2. 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 3. Small joint effusion.   Electronically Signed   By: Maisie Fushomas  Lawrence M.D.   On: 12/05/2013 18:46   Dg Knee Complete 4 Views Right  12/05/2013   CLINICAL DATA:  Larey Seat 1 day ago onto both knees, anterior knee pain bilaterally  EXAM: RIGHT KNEE - COMPLETE 4+ VIEW  COMPARISON:  02/08/2007  FINDINGS: Osseous demineralization.  Tricompartmental osteoarthritic changes with joint space narrowing and spur formation most severe at medial compartment.  No acute fracture, dislocation or bone destruction.  No definite knee joint effusion.  Scattered atherosclerotic calcification.  IMPRESSION: Osseous demineralization with tricompartmental osteoarthritic changes.  No definite acute osseous findings.   Electronically Signed   By: Ulyses Southward M.D.   On: 12/05/2013 19:13   The patient states she is unable to wait for all results.  States she has to leave and was more concerned about blood clots than the possibility of a fracture. The patient was signed out just as results came in.  MDM   1. Knee pain, bilateral    Meds ordered this encounter   Medications  . ketorolac (TORADOL) injection 60 mg    Sig:     Rest, ice, and ibuprofen for discomfort.  The patient to follow up with primary or orthopedist of choice if symptoms worsen or fail to resolve. The patient verbalizes understanding and agrees to plan of care.       Weber Cooks, NP 12/05/13 1922

## 2013-12-05 NOTE — Discharge Instructions (Signed)
RICE: Routine Care for Injuries The routine care of many injuries includes Rest, Ice, Compression, and Elevation (RICE). HOME CARE INSTRUCTIONS  Rest is needed to allow your body to heal. Routine activities can usually be resumed when comfortable. Injured tendons and bones can take up to 6 weeks to heal. Tendons are the cord-like structures that attach muscle to bone.  Ice following an injury helps keep the swelling down and reduces pain.  Put ice in a plastic bag.  Place a towel between your skin and the bag.  Leave the ice on for 15-20 minutes, 3-4 times a day, or as directed by your health care provider. Do this while awake, for the first 24 to 48 hours. After that, continue as directed by your caregiver.  Compression helps keep swelling down. It also gives support and helps with discomfort. If an elastic bandage has been applied, it should be removed and reapplied every 3 to 4 hours. It should not be applied tightly, but firmly enough to keep swelling down. Watch fingers or toes for swelling, bluish discoloration, coldness, numbness, or excessive pain. If any of these problems occur, remove the bandage and reapply loosely. Contact your caregiver if these problems continue.  Elevation helps reduce swelling and decreases pain. With extremities, such as the arms, hands, legs, and feet, the injured area should be placed near or above the level of the heart, if possible. SEEK IMMEDIATE MEDICAL CARE IF:  You have persistent pain and swelling.  You develop redness, numbness, or unexpected weakness.  Your symptoms are getting worse rather than improving after several days. These symptoms may indicate that further evaluation or further X-rays are needed. Sometimes, X-rays may not show a small broken bone (fracture) until 1 week or 10 days later. Make a follow-up appointment with your caregiver. Ask when your X-ray results will be ready. Make sure you get your X-ray results. Document Released:  09/07/2000 Document Revised: 05/31/2013 Document Reviewed: 10/25/2010 Exit[employer] Patient Information 2015 [employer]. This information is not intended to replace advice given to you by your health care provider. Make sure you discuss any questions you have with your health care provider.  

## 2013-12-05 NOTE — ED Provider Notes (Signed)
Medical screening examination/treatment/procedure(s) were performed by resident physician or non-physician practitioner and as supervising physician I was immediately available for consultation/collaboration.   Barkley Bruns MD.   Linna Hoff, MD 12/05/13 2122

## 2013-12-05 NOTE — ED Notes (Signed)
Pt c/o bilateral knee pain onset Sunday Reports she fell off her bed onto hardwood flooring Hx of arthritis Alert w/no signs of acute distress.

## 2014-10-06 ENCOUNTER — Encounter (HOSPITAL_COMMUNITY): Payer: Self-pay | Admitting: *Deleted

## 2014-10-06 ENCOUNTER — Inpatient Hospital Stay (HOSPITAL_COMMUNITY)
Admission: EM | Admit: 2014-10-06 | Discharge: 2014-10-11 | DRG: 287 | Disposition: A | Discharge status: Home / Self Care | Payer: Medicaid Other | Attending: Cardiology | Admitting: Cardiology

## 2014-10-06 DIAGNOSIS — I10 Essential (primary) hypertension: Secondary | ICD-10-CM | POA: Diagnosis present

## 2014-10-06 DIAGNOSIS — Z9119 Patient's noncompliance with other medical treatment and regimen: Secondary | ICD-10-CM | POA: Diagnosis present

## 2014-10-06 DIAGNOSIS — Z599 Problem related to housing and economic circumstances, unspecified: Secondary | ICD-10-CM

## 2014-10-06 DIAGNOSIS — Z23 Encounter for immunization: Secondary | ICD-10-CM

## 2014-10-06 DIAGNOSIS — I252 Old myocardial infarction: Secondary | ICD-10-CM

## 2014-10-06 DIAGNOSIS — I639 Cerebral infarction, unspecified: Secondary | ICD-10-CM | POA: Diagnosis present

## 2014-10-06 DIAGNOSIS — F17210 Nicotine dependence, cigarettes, uncomplicated: Secondary | ICD-10-CM | POA: Diagnosis present

## 2014-10-06 DIAGNOSIS — Z6841 Body Mass Index (BMI) 40.0 and over, adult: Secondary | ICD-10-CM

## 2014-10-06 DIAGNOSIS — Z880 Allergy status to penicillin: Secondary | ICD-10-CM

## 2014-10-06 DIAGNOSIS — I428 Other cardiomyopathies: Secondary | ICD-10-CM

## 2014-10-06 DIAGNOSIS — R011 Cardiac murmur, unspecified: Secondary | ICD-10-CM | POA: Diagnosis present

## 2014-10-06 DIAGNOSIS — R079 Chest pain, unspecified: Secondary | ICD-10-CM

## 2014-10-06 DIAGNOSIS — Z993 Dependence on wheelchair: Secondary | ICD-10-CM

## 2014-10-06 DIAGNOSIS — I25119 Atherosclerotic heart disease of native coronary artery with unspecified angina pectoris: Secondary | ICD-10-CM | POA: Diagnosis present

## 2014-10-06 DIAGNOSIS — Z8673 Personal history of transient ischemic attack (TIA), and cerebral infarction without residual deficits: Secondary | ICD-10-CM

## 2014-10-06 DIAGNOSIS — Z91128 Patient's intentional underdosing of medication regimen for other reason: Secondary | ICD-10-CM | POA: Diagnosis present

## 2014-10-06 DIAGNOSIS — I249 Acute ischemic heart disease, unspecified: Principal | ICD-10-CM | POA: Diagnosis present

## 2014-10-06 DIAGNOSIS — T446X6A Underdosing of alpha-adrenoreceptor antagonists, initial encounter: Secondary | ICD-10-CM | POA: Diagnosis present

## 2014-10-06 DIAGNOSIS — T461X6A Underdosing of calcium-channel blockers, initial encounter: Secondary | ICD-10-CM | POA: Diagnosis present

## 2014-10-06 DIAGNOSIS — K5730 Diverticulosis of large intestine without perforation or abscess without bleeding: Secondary | ICD-10-CM | POA: Diagnosis present

## 2014-10-06 DIAGNOSIS — Z609 Problem related to social environment, unspecified: Secondary | ICD-10-CM | POA: Diagnosis present

## 2014-10-06 DIAGNOSIS — Z9114 Patient's other noncompliance with medication regimen: Secondary | ICD-10-CM | POA: Diagnosis present

## 2014-10-06 DIAGNOSIS — I429 Cardiomyopathy, unspecified: Secondary | ICD-10-CM | POA: Diagnosis present

## 2014-10-06 DIAGNOSIS — I2510 Atherosclerotic heart disease of native coronary artery without angina pectoris: Secondary | ICD-10-CM | POA: Diagnosis present

## 2014-10-06 DIAGNOSIS — E876 Hypokalemia: Secondary | ICD-10-CM | POA: Diagnosis present

## 2014-10-06 HISTORY — DX: Other cardiomyopathies: I42.8

## 2014-10-06 LAB — BASIC METABOLIC PANEL
ANION GAP: 16 — AB (ref 5–15)
BUN: 10 mg/dL (ref 6–23)
CHLORIDE: 108 mmol/L (ref 96–112)
CO2: 20 mmol/L (ref 19–32)
Calcium: 9 mg/dL (ref 8.4–10.5)
Creatinine, Ser: 0.84 mg/dL (ref 0.50–1.10)
GFR calc non Af Amer: 74 mL/min — ABNORMAL LOW (ref >90)
GFR, EST AFRICAN AMERICAN: 86 mL/min — AB (ref >90)
GLUCOSE: 104 mg/dL — AB (ref 70–99)
POTASSIUM: 2.9 mmol/L — AB (ref 3.5–5.1)
SODIUM: 144 mmol/L (ref 135–145)

## 2014-10-06 LAB — I-STAT TROPONIN, ED: Troponin i, poc: 0.04 ng/mL (ref 0.00–0.08)

## 2014-10-06 MED ORDER — NITROGLYCERIN 0.4 MG SL SUBL: 0.4000 mg | SUBLINGUAL_TABLET | SUBLINGUAL | Status: DC | PRN

## 2014-10-06 MED ORDER — GI COCKTAIL ~~LOC~~
30.0000 mL | Freq: Once | ORAL | Status: AC
  Administered 2014-10-06: 30 mL via ORAL
  Filled 2014-10-06: qty 30

## 2014-10-06 MED ORDER — LABETALOL HCL 5 MG/ML IV SOLN
10.0000 mg | Freq: Once | INTRAVENOUS | Status: AC
  Administered 2014-10-06: 10 mg via INTRAVENOUS
  Filled 2014-10-06: qty 4

## 2014-10-06 NOTE — ED Provider Notes (Signed)
CSN: [id number]     Arrival date & time 10/06/14  2241 History   First MD Initiated Contact with Patient 10/06/14 2248     Chief Complaint  Patient presents with  . Chest Pain     (Consider location/radiation/quality/duration/timing/severity/associated sxs/prior Treatment) Patient is a 59 y.o. female presenting with chest pain.  Chest Pain Pain location:  Substernal area Pain quality comment:  Like I'm having a heart attack Pain radiates to:  Does not radiate Pain radiates to the back: no   Pain severity:  Severe Onset quality:  Sudden Duration:  2 hours Timing:  Constant Progression:  Partially resolved Chronicity:  New Context: eating   Relieved by:  Nothing Worsened by:  Nothing tried Associated symptoms: nausea (after nitro) and shortness of breath   Associated symptoms: no abdominal pain, no fever and not vomiting     Past Medical History  Diagnosis Date  . Hypertension   . Arthritis   . Stroke   . Coronary artery disease   . Asthma   . Diverticular disease of large intestine     diverticular abscess in sigmoid region by CT 2009   Past Surgical History  Procedure Laterality Date  . Abdominal hysterectomy    . Coronary stent placement    . I&d extremity  08/18/2011    Procedure: IRRIGATION AND DEBRIDEMENT EXTREMITY;  Surgeon: Sharma Covert, MD;  Location: [facility] OR;  Service: Orthopedics;  Laterality: Right;  I&D Right Long Finger  . Amputation  08/21/2011    Procedure: AMPUTATION DIGIT;  Surgeon: Sharma Covert, MD;  Location: [facility] OR;  Service: Orthopedics;  Laterality: Right;  . Flexible sigmoidoscopy  04/28/2012    Procedure: FLEXIBLE SIGMOIDOSCOPY;  Surgeon: Petra Kuba, MD;  Location: [facility];  Service: Endoscopy;  Laterality: N/A;  unsedated, unprepped   History reviewed. No pertinent family history. History  Substance Use Topics  . Smoking status: Current Every Day Smoker -- 0.50 packs/day for 36 years    Types: Cigarettes  . Smokeless tobacco: Never  Used  . Alcohol Use: 3.6 oz/week    6 Cans of beer per week     Comment: 04/27/12- "amount varies"   OB History    No data available     Review of Systems  Constitutional: Negative for fever.  Respiratory: Positive for shortness of breath.   Cardiovascular: Positive for chest pain.  Gastrointestinal: Positive for nausea (after nitro). Negative for vomiting and abdominal pain.  All other systems reviewed and are negative.     Allergies  Penicillins  Home Medications   Prior to Admission medications   Medication Sig Start Date End Date Taking? Authorizing Provider  amLODipine (NORVASC) 10 MG tablet Take 10 mg by mouth daily.    Historical Provider, MD  cyclobenzaprine (FLEXERIL) 10 MG tablet Take 10 mg by mouth 3 (three) times daily as needed. For muscle spasms    Historical Provider, MD  docusate sodium (COLACE) 100 MG capsule Take 100 mg by mouth 2 (two) times daily.    Historical Provider, MD  doxazosin (CARDURA) 4 MG tablet Take 4 mg by mouth at bedtime.    Historical Provider, MD  HYDROcodone-acetaminophen (NORCO) 10-325 MG per tablet Take 1 tablet by mouth every 6 (six) hours as needed.    Historical Provider, MD  isosorbide dinitrate (ISORDIL) 30 MG tablet Take 30 mg by mouth daily.    Historical Provider, MD  lubiprostone (AMITIZA) 24 MCG capsule Take 24 mcg by mouth 2 (two) times daily  with a meal.    Historical Provider, MD  phenylephrine (,USE FOR PREPARATION-H,) 0.25 % suppository Place 1 suppository rectally 2 (two) times daily. Patient not taking: Reported on 10/07/2014 04/28/12   Osvaldo Shipper, MD  potassium chloride (K-DUR) 10 MEQ tablet Take 2 tablets (20 mEq total) by mouth 2 (two) times daily. Patient not taking: Reported on 10/07/2014 04/28/12   Osvaldo Shipper, MD  simvastatin (ZOCOR) 40 MG tablet Take 40 mg by mouth every evening.    Historical Provider, MD  traMADol (ULTRAM) 50 MG tablet Take 50 mg by mouth every 6 (six) hours as needed.    Historical  Provider, MD  traZODone (DESYREL) 100 MG tablet Take 100 mg by mouth at bedtime.    Historical Provider, MD   BP 164/56 mmHg  Pulse 65  Temp(Src) 98.2 F (36.8 C) (Oral)  Resp 18  Wt 256 lb 9.9 oz (116.4 kg)  SpO2 99% Physical Exam  Constitutional: She is oriented to person, place, and time. She appears well-developed and well-nourished.  HENT:  Head: Normocephalic and atraumatic.  Right Ear: External ear normal.  Left Ear: External ear normal.  Eyes: Conjunctivae and EOM are normal. Pupils are equal, round, and reactive to light.  Neck: Normal range of motion. Neck supple.  Cardiovascular: Normal rate, regular rhythm, normal heart sounds and intact distal pulses.   Pulmonary/Chest: Effort normal and breath sounds normal.  Abdominal: Soft. Bowel sounds are normal. There is tenderness in the right upper quadrant and epigastric area.  Musculoskeletal: Normal range of motion.  Neurological: She is alert and oriented to person, place, and time.  Skin: Skin is warm and dry.  Vitals reviewed.   ED Course  Procedures (including critical care time) Labs Review Labs Reviewed  BASIC METABOLIC PANEL - Abnormal; Notable for the following:    Potassium 2.9 (*)    Glucose, Bld 104 (*)    GFR calc non Af Amer 74 (*)    GFR calc Af Amer 86 (*)    Anion gap 16 (*)    All other components within normal limits  CBC WITH DIFFERENTIAL/PLATELET - Abnormal; Notable for the following:    RBC 5.73 (*)    Hemoglobin 15.5 (*)    HCT 46.9 (*)    RDW 16.9 (*)    All other components within normal limits  TROPONIN I - Abnormal; Notable for the following:    Troponin I 0.26 (*)    All other components within normal limits  TROPONIN I - Abnormal; Notable for the following:    Troponin I 0.28 (*)    All other components within normal limits  TROPONIN I - Abnormal; Notable for the following:    Troponin I 0.31 (*)    All other components within normal limits  HEPARIN LEVEL (UNFRACTIONATED) -  Abnormal; Notable for the following:    Heparin Unfractionated 0.89 (*)    All other components within normal limits  LIPID PANEL  HEMOGLOBIN A1C  CBC  HEPARIN LEVEL (UNFRACTIONATED)  I-STAT TROPOININ, ED    Imaging Review Dg Chest 2 View  10/07/2014   CLINICAL DATA:  Acute onset of central chest pain. Initial encounter.  EXAM: CHEST  2 VIEW  COMPARISON:  None.  FINDINGS: The lungs are well-aerated and clear. There is no evidence of focal opacification, pleural effusion or pneumothorax.  The heart is mildly enlarged. No acute osseous abnormalities are seen.  IMPRESSION: Mild cardiomegaly; lungs remain grossly clear.   Electronically Signed   By: Leotis Shames  Chang M.D.   On: 10/07/2014 01:02     EKG Interpretation   Date/Time:  Friday October 06 2014 22:58:48 EDT Ventricular Rate:  90 PR Interval:  152 QRS Duration: 100 QT Interval:  435 QTC Calculation: 532 R Axis:   69 Text Interpretation:  Sinus rhythm Multiform ventricular premature  complexes poor baseline, inadequate tracing Prolonged QT interval  Confirmed by Mirian Mo [phone]) on 10/06/2014 11:12:02 PM      MDM   Final diagnoses:  None    59 y.o. female with pertinent PMH of CAD, prior MI, HTN (noncompliant with medications) presents with chest pain as above, identical to prior MI per her report.  Exam benign.   Wu with initial negative trop.  No ECG changes.  Consulted cardiology for admission.   I have reviewed all laboratory and imaging studies if ordered as above.    1. ACS    Mirian Mo, MD 10/07/14 [phone]

## 2014-10-06 NOTE — ED Notes (Signed)
Pt to ED from home via [facility] c/o central chest pain; denies radiation. Reports burning pain started after eating chicken for dinner. EMS gave 324mg  ASA and nitro x 2 without relief. Pt also noted to be hypertensive for EMS; reports being out of medications x 2 months

## 2014-10-07 ENCOUNTER — Emergency Department (HOSPITAL_COMMUNITY): Payer: Medicaid Other

## 2014-10-07 DIAGNOSIS — I252 Old myocardial infarction: Secondary | ICD-10-CM | POA: Diagnosis not present

## 2014-10-07 DIAGNOSIS — Z880 Allergy status to penicillin: Secondary | ICD-10-CM | POA: Diagnosis not present

## 2014-10-07 DIAGNOSIS — T446X6A Underdosing of alpha-adrenoreceptor antagonists, initial encounter: Secondary | ICD-10-CM | POA: Diagnosis present

## 2014-10-07 DIAGNOSIS — Z609 Problem related to social environment, unspecified: Secondary | ICD-10-CM | POA: Diagnosis present

## 2014-10-07 DIAGNOSIS — Z9119 Patient's noncompliance with other medical treatment and regimen: Secondary | ICD-10-CM | POA: Diagnosis present

## 2014-10-07 DIAGNOSIS — I25119 Atherosclerotic heart disease of native coronary artery with unspecified angina pectoris: Secondary | ICD-10-CM | POA: Diagnosis not present

## 2014-10-07 DIAGNOSIS — R012 Other cardiac sounds: Secondary | ICD-10-CM | POA: Diagnosis not present

## 2014-10-07 DIAGNOSIS — Z993 Dependence on wheelchair: Secondary | ICD-10-CM | POA: Diagnosis not present

## 2014-10-07 DIAGNOSIS — T461X6A Underdosing of calcium-channel blockers, initial encounter: Secondary | ICD-10-CM | POA: Diagnosis present

## 2014-10-07 DIAGNOSIS — Z9114 Patient's other noncompliance with medication regimen: Secondary | ICD-10-CM | POA: Diagnosis present

## 2014-10-07 DIAGNOSIS — Z23 Encounter for immunization: Secondary | ICD-10-CM | POA: Diagnosis not present

## 2014-10-07 DIAGNOSIS — Z91128 Patient's intentional underdosing of medication regimen for other reason: Secondary | ICD-10-CM | POA: Diagnosis present

## 2014-10-07 DIAGNOSIS — I249 Acute ischemic heart disease, unspecified: Secondary | ICD-10-CM | POA: Diagnosis present

## 2014-10-07 DIAGNOSIS — I429 Cardiomyopathy, unspecified: Secondary | ICD-10-CM | POA: Diagnosis present

## 2014-10-07 DIAGNOSIS — Z8673 Personal history of transient ischemic attack (TIA), and cerebral infarction without residual deficits: Secondary | ICD-10-CM | POA: Diagnosis not present

## 2014-10-07 DIAGNOSIS — F17210 Nicotine dependence, cigarettes, uncomplicated: Secondary | ICD-10-CM | POA: Diagnosis present

## 2014-10-07 DIAGNOSIS — R079 Chest pain, unspecified: Secondary | ICD-10-CM | POA: Diagnosis present

## 2014-10-07 DIAGNOSIS — I2510 Atherosclerotic heart disease of native coronary artery without angina pectoris: Secondary | ICD-10-CM | POA: Diagnosis present

## 2014-10-07 DIAGNOSIS — Z6841 Body Mass Index (BMI) 40.0 and over, adult: Secondary | ICD-10-CM | POA: Diagnosis not present

## 2014-10-07 DIAGNOSIS — I10 Essential (primary) hypertension: Secondary | ICD-10-CM

## 2014-10-07 DIAGNOSIS — E876 Hypokalemia: Secondary | ICD-10-CM | POA: Diagnosis present

## 2014-10-07 DIAGNOSIS — R011 Cardiac murmur, unspecified: Secondary | ICD-10-CM | POA: Diagnosis not present

## 2014-10-07 DIAGNOSIS — Z599 Problem related to housing and economic circumstances, unspecified: Secondary | ICD-10-CM | POA: Diagnosis not present

## 2014-10-07 LAB — CBC WITH DIFFERENTIAL/PLATELET
BASOS PCT: 0 % (ref 0–1)
Basophils Absolute: 0 10*3/uL (ref 0.0–0.1)
Eosinophils Absolute: 0 10*3/uL (ref 0.0–0.7)
Eosinophils Relative: 0 % (ref 0–5)
HEMATOCRIT: 46.9 % — AB (ref 36.0–46.0)
HEMOGLOBIN: 15.5 g/dL — AB (ref 12.0–15.0)
LYMPHS PCT: 33 % (ref 12–46)
Lymphs Abs: 3.5 10*3/uL (ref 0.7–4.0)
MCH: 27.1 pg (ref 26.0–34.0)
MCHC: 33 g/dL (ref 30.0–36.0)
MCV: 81.8 fL (ref 78.0–100.0)
MONO ABS: 0.7 10*3/uL (ref 0.1–1.0)
MONOS PCT: 6 % (ref 3–12)
NEUTROS ABS: 6.2 10*3/uL (ref 1.7–7.7)
NEUTROS PCT: 60 % (ref 43–77)
Platelets: 156 10*3/uL (ref 150–400)
RBC: 5.73 MIL/uL — AB (ref 3.87–5.11)
RDW: 16.9 % — ABNORMAL HIGH (ref 11.5–15.5)
WBC: 10.4 10*3/uL (ref 4.0–10.5)

## 2014-10-07 LAB — LIPID PANEL
Cholesterol: 154 mg/dL (ref 0–200)
HDL: 46 mg/dL (ref >39)
LDL Cholesterol: 86 mg/dL (ref 0–99)
Total CHOL/HDL Ratio: 3.3 RATIO
Triglycerides: 108 mg/dL (ref <150)
VLDL: 22 mg/dL (ref 0–40)

## 2014-10-07 LAB — TROPONIN I
TROPONIN I: 0.26 ng/mL — AB (ref <0.031)
Troponin I: 0.28 ng/mL — ABNORMAL HIGH (ref <0.031)
Troponin I: 0.31 ng/mL — ABNORMAL HIGH (ref <0.031)

## 2014-10-07 LAB — HEPARIN LEVEL (UNFRACTIONATED): Heparin Unfractionated: 0.89 IU/mL — ABNORMAL HIGH (ref 0.30–0.70)

## 2014-10-07 MED ORDER — ALUM & MAG HYDROXIDE-SIMETH 200-200-20 MG/5ML PO SUSP: 30.0000 mL | ORAL | Status: DC | PRN

## 2014-10-07 MED ORDER — CARVEDILOL 6.25 MG PO TABS
6.2500 mg | ORAL_TABLET | Freq: Two times a day (BID) | ORAL | Status: DC
  Administered 2014-10-07 – 2014-10-11 (×7): 6.25 mg via ORAL
  Filled 2014-10-07 (×12): qty 1

## 2014-10-07 MED ORDER — HYDRALAZINE HCL 20 MG/ML IJ SOLN
10.0000 mg | INTRAMUSCULAR | Status: DC | PRN
  Administered 2014-10-07 – 2014-10-10 (×2): 10 mg via INTRAVENOUS
  Filled 2014-10-07 (×2): qty 1

## 2014-10-07 MED ORDER — PNEUMOCOCCAL VAC POLYVALENT 25 MCG/0.5ML IJ INJ
0.5000 mL | INJECTION | INTRAMUSCULAR | Status: DC
  Filled 2014-10-07: qty 0.5

## 2014-10-07 MED ORDER — ACETAMINOPHEN 325 MG PO TABS: 650.0000 mg | ORAL_TABLET | ORAL | Status: DC | PRN

## 2014-10-07 MED ORDER — HEPARIN BOLUS VIA INFUSION
2000.0000 [IU] | Freq: Once | INTRAVENOUS | Status: AC
  Administered 2014-10-07: 2000 [IU] via INTRAVENOUS
  Filled 2014-10-07: qty 2000

## 2014-10-07 MED ORDER — SODIUM CHLORIDE 0.9 % IJ SOLN
3.0000 mL | Freq: Two times a day (BID) | INTRAMUSCULAR | Status: DC
  Administered 2014-10-07 – 2014-10-10 (×5): 3 mL via INTRAVENOUS

## 2014-10-07 MED ORDER — PANTOPRAZOLE SODIUM 40 MG PO TBEC
40.0000 mg | DELAYED_RELEASE_TABLET | Freq: Every day | ORAL | Status: DC
  Administered 2014-10-07 – 2014-10-11 (×5): 40 mg via ORAL
  Filled 2014-10-07 (×5): qty 1

## 2014-10-07 MED ORDER — POTASSIUM CHLORIDE 10 MEQ/100ML IV SOLN
10.0000 meq | Freq: Once | INTRAVENOUS | Status: AC
  Administered 2014-10-07: 10 meq via INTRAVENOUS
  Filled 2014-10-07: qty 100

## 2014-10-07 MED ORDER — ONDANSETRON HCL 4 MG/2ML IJ SOLN: 4.0000 mg | Freq: Four times a day (QID) | INTRAMUSCULAR | Status: DC | PRN

## 2014-10-07 MED ORDER — NITROGLYCERIN 0.4 MG SL SUBL: 0.4000 mg | SUBLINGUAL_TABLET | SUBLINGUAL | Status: DC | PRN

## 2014-10-07 MED ORDER — ASPIRIN EC 81 MG PO TBEC
81.0000 mg | DELAYED_RELEASE_TABLET | Freq: Every day | ORAL | Status: DC
  Administered 2014-10-08: 81 mg via ORAL
  Filled 2014-10-07: qty 1

## 2014-10-07 MED ORDER — ATORVASTATIN CALCIUM 40 MG PO TABS
40.0000 mg | ORAL_TABLET | Freq: Every day | ORAL | Status: DC
  Administered 2014-10-07: 40 mg via ORAL
  Filled 2014-10-07 (×2): qty 1

## 2014-10-07 MED ORDER — LABETALOL HCL 5 MG/ML IV SOLN
20.0000 mg | Freq: Once | INTRAVENOUS | Status: AC
  Administered 2014-10-07: 20 mg via INTRAVENOUS
  Filled 2014-10-07: qty 4

## 2014-10-07 MED ORDER — LABETALOL HCL 5 MG/ML IV SOLN
10.0000 mg | Freq: Once | INTRAVENOUS | Status: AC
  Administered 2014-10-07: 10 mg via INTRAVENOUS
  Filled 2014-10-07: qty 4

## 2014-10-07 MED ORDER — SODIUM CHLORIDE 0.9 % IJ SOLN: 3.0000 mL | INTRAMUSCULAR | Status: DC | PRN

## 2014-10-07 MED ORDER — PERFLUTREN LIPID MICROSPHERE
1.0000 mL | INTRAVENOUS | Status: AC | PRN
  Administered 2014-10-07: 2 mL via INTRAVENOUS
  Filled 2014-10-07: qty 10

## 2014-10-07 MED ORDER — GI COCKTAIL ~~LOC~~
30.0000 mL | Freq: Three times a day (TID) | ORAL | Status: DC | PRN
  Filled 2014-10-07: qty 30

## 2014-10-07 MED ORDER — ENOXAPARIN SODIUM 40 MG/0.4ML ~~LOC~~ SOLN
40.0000 mg | Freq: Every day | SUBCUTANEOUS | Status: DC
  Administered 2014-10-07: 40 mg via SUBCUTANEOUS
  Filled 2014-10-07: qty 0.4

## 2014-10-07 MED ORDER — POTASSIUM CHLORIDE CRYS ER 20 MEQ PO TBCR
40.0000 meq | EXTENDED_RELEASE_TABLET | Freq: Once | ORAL | Status: AC
  Administered 2014-10-07: 40 meq via ORAL
  Filled 2014-10-07: qty 2

## 2014-10-07 MED ORDER — LISINOPRIL 20 MG PO TABS
20.0000 mg | ORAL_TABLET | Freq: Every day | ORAL | Status: DC
  Administered 2014-10-07 – 2014-10-11 (×5): 20 mg via ORAL
  Filled 2014-10-07 (×5): qty 1

## 2014-10-07 MED ORDER — HEPARIN (PORCINE) IN NACL 100-0.45 UNIT/ML-% IJ SOLN
1200.0000 [IU]/h | INTRAMUSCULAR | Status: DC
Start: 2014-10-07 — End: 2014-10-09
  Administered 2014-10-07: 1400 [IU]/h via INTRAVENOUS
  Administered 2014-10-08: 1200 [IU]/h via INTRAVENOUS
  Filled 2014-10-07 (×6): qty 250

## 2014-10-07 MED ORDER — SODIUM CHLORIDE 0.9 % IV SOLN: 250.0000 mL | INTRAVENOUS | Status: DC | PRN

## 2014-10-07 NOTE — Progress Notes (Addendum)
Patient ID: [name], female   DOB: 15-Sep-1955, 59 y.o.   MRN: [id number]    Subjective:    Chest pain resolved  Objective:   Temp:  [98.4 F (36.9 C)-98.5 F (36.9 C)] 98.5 F (36.9 C) (04/30 0412) Pulse Rate:  [71-88] 81 (04/30 0412) Resp:  [14-37] 18 (04/30 0412) BP: (173-248)/(52-120) 173/52 mmHg (04/30 0412) SpO2:  [90 %-100 %] 100 % (04/30 0412) Weight:  [256 lb 9.9 oz (116.4 kg)] 256 lb 9.9 oz (116.4 kg) (04/30 0412)    Filed Weights   10/07/14 0412  Weight: 256 lb 9.9 oz (116.4 kg)   No intake or output data in the 24 hours ending 10/07/14 1129  Exam:  General: NAD  Resp: CTAB  Cardiac: RRR, 3/6 systolic murmur, no JVD  GI: abdomen soft, N, ND  MSK:no LE edema  Neuro: no focal deficits  Psych: appropriate affect  Lab Results:  Basic Metabolic Panel:  Recent Labs Lab 10/06/14 2315  NA 144  K 2.9*  CL 108  CO2 20  GLUCOSE 104*  BUN 10  CREATININE 0.84  CALCIUM 9.0    Liver Function Tests: No results for input(s): AST, ALT, ALKPHOS, BILITOT, PROT, ALBUMIN in the last 168 hours.  CBC:  Recent Labs Lab 10/06/14 2315  WBC 10.4  HGB 15.5*  HCT 46.9*  MCV 81.8  PLT 156    Cardiac Enzymes:  Recent Labs Lab 10/07/14 0442  TROPONINI 0.26*    BNP: No results for input(s): PROBNP in the last 8760 hours.  Coagulation: No results for input(s): INR in the last 168 hours.  ECG:   Medications:   Scheduled Medications: . [START ON 10/08/2014] aspirin EC  81 mg Oral Daily  . atorvastatin  40 mg Oral q1800  . carvedilol  6.25 mg Oral BID WC  . enoxaparin (LOVENOX) injection  40 mg Subcutaneous Daily  . lisinopril  20 mg Oral Daily  . pantoprazole  40 mg Oral Daily  . [START ON 10/08/2014] pneumococcal 23 valent vaccine  0.5 mL Intramuscular Tomorrow-1000  . sodium chloride  3 mL Intravenous Q12H     Infusions:     PRN Medications:  sodium chloride, acetaminophen, alum & mag hydroxide-simeth, gi cocktail, hydrALAZINE,  nitroGLYCERIN, ondansetron (ZOFRAN) IV, sodium chloride     Assessment/Plan    1. Chest pain - remote hx of CAD - initial trop neg, repeat up to 0.26. EKG inferior and lateral precordial ST depression.  - unclear if troponin due to ACS or severe HTN on admission. BP trending down on home meds - will start heparin. She is on atorva, asa, coreg, ace-I.  - follow troponin trend, f/u echo. Anticipate likely will need cath Monday  2. HTN - non-compliant with meds, presented with severe HTN - home bp meds restarted overnight  3. Heart murmur  - f/u echo       Dina Rich, M.D.

## 2014-10-07 NOTE — Progress Notes (Signed)
ANTICOAGULATION CONSULT NOTE - Initial Consult  Pharmacy Consult for Heparin Indication: chest pain/ACS  Allergies  Allergen Reactions  . Penicillins Itching    Patient Measurements: Weight: 256 lb 9.9 oz (116.4 kg)  Vital Signs: Temp: 98.2 F (36.8 C) (04/30 1943) Temp Source: Oral (04/30 1943) BP: 164/56 mmHg (04/30 1943) Pulse Rate: 65 (04/30 1943)  Labs:  Recent Labs  10/06/14 2315 10/07/14 0442 10/07/14 1123 10/07/14 1650 10/07/14 2004  HGB 15.5*  --   --   --   --   HCT 46.9*  --   --   --   --   PLT 156  --   --   --   --   HEPARINUNFRC  --   --   --   --  0.89*  CREATININE 0.84  --   --   --   --   TROPONINI  --  0.26* 0.28* 0.31*  --     CrCl cannot be calculated (Unknown ideal weight.).   Medical History: Past Medical History  Diagnosis Date  . Hypertension   . Arthritis   . Stroke   . Coronary artery disease   . Asthma   . 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  Pharmacy consulted to dose heparin  Coag/Heme: ACS, to start heparin, patient denies recent bleeding ("I don't even have a period", surgery or blood thinning medications.    Goal of Therapy:  Heparin level 0.3-0.7 units/ml Monitor platelets by anticoagulation protocol: Yes   Plan:  Heparin 2000 units IV x 1 (half bolus with prophylactic dose of enoxaparin given at 10a) Heparin 1400 units/hr Check hepairn level 6h after ggt starts, then daily + CBC  Thank you for allowing pharmacy to be a part of this patients care team.  Lovenia Kim Pharm.D., BCPS, AQ-Cardiology Clinical Pharmacist 10/07/2014 10:33 PM Pager: [phone] Phone: [phone]  10:34 PM heparin level reported as 0.89, decrease heparin to 1200 units/hr.  Check with am labs.  Peniel Biel,  Michae Grimley C

## 2014-10-07 NOTE — Progress Notes (Signed)
ANTICOAGULATION CONSULT NOTE - Initial Consult  Pharmacy Consult for Heparin Indication: chest pain/ACS  Allergies  Allergen Reactions  . Penicillins Itching    Patient Measurements: Weight: 256 lb 9.9 oz (116.4 kg)  Vital Signs: Temp: 98.5 F (36.9 C) (04/30 0412) Temp Source: Oral (04/30 0412) BP: 173/52 mmHg (04/30 0412) Pulse Rate: 81 (04/30 0412)  Labs:  Recent Labs  10/06/14 2315 10/07/14 0442  HGB 15.5*  --   HCT 46.9*  --   PLT 156  --   CREATININE 0.84  --   TROPONINI  --  0.26*    CrCl cannot be calculated (Unknown ideal weight.).   Medical History: Past Medical History  Diagnosis Date  . Hypertension   . Arthritis   . Stroke   . Coronary artery disease   . Asthma   . 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  Pharmacy consulted to dose heparin  Coag/Heme: ACS, to start heparin, patient denies recent bleeding ("I don't even have a period", surgery or blood thinning medications.    Goal of Therapy:  Heparin level 0.3-0.7 units/ml Monitor platelets by anticoagulation protocol: Yes   Plan:  Heparin 2000 units IV x 1 (half bolus with prophylactic dose of enoxaparin given at 10a) Heparin 1400 units/hr Check hepairn level 6h after ggt starts, then daily + CBC  Thank you for allowing pharmacy to be a part of this patients care team.  Lovenia Kim Pharm.D., BCPS, AQ-Cardiology Clinical Pharmacist 10/07/2014 12:18 PM Pager: [phone] Phone: [phone]

## 2014-10-07 NOTE — H&P (Signed)
[name] is an 59 y.o. female.   Chief Complaint: Chest pain  HPI:  [name] is a 59 year old female with a history of CAD by her report. She states she had a cath over 15 years ago and possibly had balloon angioplasty. She presents today with chest pain. She is noted to have severe hypertension on arrival in the ED. She has not been taking any medications for quite some time. She doesn't have much more info on her heart attack. She also has had 2 strokes. She is wheelchair bound because of knee issues and spends most of her time watching TV and smoking. She states that her chest pain started while eating dinner. The pain was relieved by maalox. The pain is sharp and in the center of her chest and her abdomen. There is no associated syncope or diaphoresis. She denies any recent history of rest or exertional chest pain.   Past Medical History  Diagnosis Date  . Hypertension   . Arthritis   . Stroke   . Coronary artery disease   . Asthma   . Diverticular disease of large intestine     diverticular abscess in sigmoid region by CT 2009    Past Surgical History  Procedure Laterality Date  . Abdominal hysterectomy    . Coronary stent placement    . I&d extremity  08/18/2011    Procedure: IRRIGATION AND DEBRIDEMENT EXTREMITY;  Surgeon: Linna Hoff, MD;  Location: [place];  Service: Orthopedics;  Laterality: Right;  I&D Right Long Finger  . Amputation  08/21/2011    Procedure: AMPUTATION DIGIT;  Surgeon: Linna Hoff, MD;  Location: [place];  Service: Orthopedics;  Laterality: Right;  . Flexible sigmoidoscopy  04/28/2012    Procedure: FLEXIBLE SIGMOIDOSCOPY;  Surgeon: Jeryl Columbia, MD;  Location: [facility];  Service: Endoscopy;  Laterality: N/A;  unsedated, unprepped    History reviewed. No pertinent family history. Social History:  reports that she has been smoking Cigarettes.  She has a 18 pack-year smoking history. She has never used smokeless tobacco. She reports that she drinks  about 3.6 oz of alcohol per week. She reports that she does not use illicit drugs.  Allergies:  Allergies  Allergen Reactions  . Penicillins Itching     (Not in a hospital admission)  Results for orders placed or performed during the hospital encounter of 10/06/14 (from the past 48 hour(s))  Basic metabolic panel     Status: Abnormal   Collection Time: 10/06/14 11:15 PM  Result Value Ref Range   Sodium 144 135 - 145 mmol/L   Potassium 2.9 (L) 3.5 - 5.1 mmol/L   Chloride 108 96 - 112 mmol/L   CO2 20 19 - 32 mmol/L   Glucose, Bld 104 (H) 70 - 99 mg/dL   BUN 10 6 - 23 mg/dL   Creatinine, Ser 0.84 0.50 - 1.10 mg/dL   Calcium 9.0 8.4 - 10.5 mg/dL   GFR calc non Af Amer 74 (L) >90 mL/min   GFR calc Af Amer 86 (L) >90 mL/min    Comment: (NOTE) The eGFR has been calculated using the CKD EPI equation. This calculation has not been validated in all clinical situations. eGFR's persistently <90 mL/min signify possible Chronic Kidney Disease.    Anion gap 16 (H) 5 - 15  CBC with Differential     Status: Abnormal   Collection Time: 10/06/14 11:15 PM  Result Value Ref Range   WBC 10.4 4.0 -  10.5 K/uL   RBC 5.73 (H) 3.87 - 5.11 MIL/uL   Hemoglobin 15.5 (H) 12.0 - 15.0 g/dL   HCT 46.9 (H) 36.0 - 46.0 %   MCV 81.8 78.0 - 100.0 fL   MCH 27.1 26.0 - 34.0 pg   MCHC 33.0 30.0 - 36.0 g/dL   RDW 16.9 (H) 11.5 - 15.5 %   Platelets 156 150 - 400 K/uL   Neutrophils Relative % 60 43 - 77 %   Neutro Abs 6.2 1.7 - 7.7 K/uL   Lymphocytes Relative 33 12 - 46 %   Lymphs Abs 3.5 0.7 - 4.0 K/uL   Monocytes Relative 6 3 - 12 %   Monocytes Absolute 0.7 0.1 - 1.0 K/uL   Eosinophils Relative 0 0 - 5 %   Eosinophils Absolute 0.0 0.0 - 0.7 K/uL   Basophils Relative 0 0 - 1 %   Basophils Absolute 0.0 0.0 - 0.1 K/uL  I-stat troponin, ED     Status: None   Collection Time: 10/06/14 11:23 PM  Result Value Ref Range   Troponin i, poc 0.04 0.00 - 0.08 ng/mL   Comment 3            Comment: Due to the  release kinetics of cTnI, a negative result within the first hours of the onset of symptoms does not rule out myocardial infarction with certainty. If myocardial infarction is still suspected, repeat the test at appropriate intervals.    Dg Chest 2 View  10/07/2014   CLINICAL DATA:  Acute onset of central chest pain. Initial encounter.  EXAM: CHEST  2 VIEW  COMPARISON:  None.  FINDINGS: The lungs are well-aerated and clear. There is no evidence of focal opacification, pleural effusion or pneumothorax.  The heart is mildly enlarged. No acute osseous abnormalities are seen.  IMPRESSION: Mild cardiomegaly; lungs remain grossly clear.   Electronically Signed   By: Garald Balding M.D.   On: 10/07/2014 01:02    Review of Systems  Cardiovascular: Positive for chest pain.  Gastrointestinal: Positive for heartburn, nausea and abdominal pain.  All other systems reviewed and are negative.   Blood pressure 221/80, pulse 75, temperature 98.4 F (36.9 C), temperature source Oral, resp. rate 14, SpO2 98 %. Physical Exam  Nursing note and vitals reviewed. Constitutional: She is oriented to person, place, and time. No distress.  HENT:  Head: Normocephalic and atraumatic.  Eyes: Pupils are equal, round, and reactive to light.  Neck: No JVD present.  Cardiovascular: Normal rate.   Murmur heard.  Systolic murmur is present with a grade of 3/6  Respiratory: Effort normal.  GI: She exhibits distension. There is tenderness.  Musculoskeletal: She exhibits no edema.  Neurological: She is alert and oriented to person, place, and time.  Skin: Skin is warm and dry.     Assessment/Plan 59 year old female with remote history of CAD, non-compliant with medical therapy presents with chest pain and malignant hypertension. Her ECG shows ST depression diffusely and her initial troponin is within normal. Her presentation sounds more consistent with GERD. She has been off of her HTN meds for at least 3 months. She  has a number of outstanding medical issues that need to be figured out as well including her murmur, a medical regimen for her CAD as well as her hypertension.   Malignant hypertension -restart lisinopril and coreg -PRN hydralazine, goal BP is 180-200 for tonight.  -telemetry  Chest pain -follow troponin -daily aspirin - will likely need  a stress test at some point -PRN maalox, NTG   CAD -daily aspirin -high intensity statin  Murmur -sounds like MR -TTE  Check lipid panel, HBA1C  Smoking -discussed the need to quit  Hypokalemia -will replace  Full code    Jerilee Hoh 10/07/2014, 1:45 AM

## 2014-10-07 NOTE — Progress Notes (Signed)
Echocardiogram 2D Echocardiogram with Definity has been performed.  [name] 10/07/2014, 1:52 PM

## 2014-10-08 DIAGNOSIS — I249 Acute ischemic heart disease, unspecified: Principal | ICD-10-CM

## 2014-10-08 HISTORY — PX: CARDIAC CATHETERIZATION: SHX172

## 2014-10-08 LAB — HEPARIN LEVEL (UNFRACTIONATED)
HEPARIN UNFRACTIONATED: 1.08 [IU]/mL — AB (ref 0.30–0.70)
HEPARIN UNFRACTIONATED: 0.28 [IU]/mL — AB (ref 0.30–0.70)

## 2014-10-08 LAB — CBC
HEMATOCRIT: 45.1 % (ref 36.0–46.0)
Hemoglobin: 14.2 g/dL (ref 12.0–15.0)
MCH: 26.5 pg (ref 26.0–34.0)
MCHC: 31.5 g/dL (ref 30.0–36.0)
MCV: 84.1 fL (ref 78.0–100.0)
PLATELETS: 146 10*3/uL — AB (ref 150–400)
RBC: 5.36 MIL/uL — AB (ref 3.87–5.11)
RDW: 17.6 % — AB (ref 11.5–15.5)
WBC: 8.8 10*3/uL (ref 4.0–10.5)

## 2014-10-08 MED ORDER — ASPIRIN EC 81 MG PO TBEC
81.0000 mg | DELAYED_RELEASE_TABLET | Freq: Every day | ORAL | Status: DC
  Administered 2014-10-10 – 2014-10-11 (×2): 81 mg via ORAL
  Filled 2014-10-08 (×2): qty 1

## 2014-10-08 MED ORDER — ASPIRIN 81 MG PO CHEW
81.0000 mg | CHEWABLE_TABLET | ORAL | Status: AC
  Administered 2014-10-09: 81 mg via ORAL
  Filled 2014-10-08: qty 1

## 2014-10-08 MED ORDER — AMLODIPINE BESYLATE 5 MG PO TABS
5.0000 mg | ORAL_TABLET | Freq: Every day | ORAL | Status: DC
  Administered 2014-10-08 – 2014-10-10 (×3): 5 mg via ORAL
  Filled 2014-10-08 (×3): qty 1

## 2014-10-08 MED ORDER — SODIUM CHLORIDE 0.9 % IJ SOLN: 3.0000 mL | INTRAMUSCULAR | Status: DC | PRN

## 2014-10-08 MED ORDER — SODIUM CHLORIDE 0.9 % IV SOLN: 250.0000 mL | INTRAVENOUS | Status: DC | PRN

## 2014-10-08 MED ORDER — SODIUM CHLORIDE 0.9 % IV SOLN
1.0000 mL/kg/h | INTRAVENOUS | Status: DC
  Administered 2014-10-08: 1 mL/kg/h via INTRAVENOUS

## 2014-10-08 MED ORDER — CETYLPYRIDINIUM CHLORIDE 0.05 % MT LIQD
7.0000 mL | Freq: Two times a day (BID) | OROMUCOSAL | Status: DC
  Administered 2014-10-08 – 2014-10-10 (×5): 7 mL via OROMUCOSAL

## 2014-10-08 MED ORDER — ATORVASTATIN CALCIUM 80 MG PO TABS
80.0000 mg | ORAL_TABLET | Freq: Every day | ORAL | Status: DC
  Administered 2014-10-08 – 2014-10-10 (×3): 80 mg via ORAL
  Filled 2014-10-08 (×4): qty 1

## 2014-10-08 MED ORDER — SODIUM CHLORIDE 0.9 % IJ SOLN
3.0000 mL | Freq: Two times a day (BID) | INTRAMUSCULAR | Status: DC
  Administered 2014-10-08 – 2014-10-09 (×2): 3 mL via INTRAVENOUS

## 2014-10-08 NOTE — Progress Notes (Signed)
Patient ID: MEDRITH CLABO, female   DOB: November 19, 1955, 59 y.o.   MRN: [id number]     Subjective:    No complaints  Objective:   Temp:  [97.7 F (36.5 C)-99.2 F (37.3 C)] 97.7 F (36.5 C) (05/01 0445) Pulse Rate:  [56-77] 56 (05/01 0445) Resp:  [17-18] 18 (05/01 0445) BP: (154-164)/(56-66) 164/56 mmHg (04/30 1943) SpO2:  [94 %-99 %] 94 % (05/01 0445)    Filed Weights   10/07/14 0412  Weight: 256 lb 9.9 oz (116.4 kg)    Intake/Output Summary (Last 24 hours) at 10/08/14 1020 Last data filed at 10/08/14 0900  Gross per 24 hour  Intake  936.4 ml  Output      0 ml  Net  936.4 ml    Telemetry: NSR  Exam:  General: NAD  Resp: CTAB  Cardiac: RRR, 2/6 systolic murmur RUSB, no JVD  AU:[id number] soft, NT, ND  MSK: no LE edema  Neuro: no focal deficits  Psych: appropriate affect  Lab Results:  Basic Metabolic Panel:  Recent Labs Lab 10/06/14 2315  NA 144  K 2.9*  CL 108  CO2 20  GLUCOSE 104*  BUN 10  CREATININE 0.84  CALCIUM 9.0    Liver Function Tests: No results for input(s): AST, ALT, ALKPHOS, BILITOT, PROT, ALBUMIN in the last 168 hours.  CBC:  Recent Labs Lab 10/06/14 2315  WBC 10.4  HGB 15.5*  HCT 46.9*  MCV 81.8  PLT 156    Cardiac Enzymes:  Recent Labs Lab 10/07/14 0442 10/07/14 1123 10/07/14 1650  TROPONINI 0.26* 0.28* 0.31*    BNP: No results for input(s): PROBNP in the last 8760 hours.  Coagulation: No results for input(s): INR in the last 168 hours.  ECG:   Medications:   Scheduled Medications: . antiseptic oral rinse  7 mL Mouth Rinse BID  . aspirin EC  81 mg Oral Daily  . atorvastatin  40 mg Oral q1800  . carvedilol  6.25 mg Oral BID WC  . lisinopril  20 mg Oral Daily  . pantoprazole  40 mg Oral Daily  . pneumococcal 23 valent vaccine  0.5 mL Intramuscular Tomorrow-1000  . sodium chloride  3 mL Intravenous Q12H     Infusions: . heparin 1,200 Units/hr (10/08/14 0900)     PRN Medications:  sodium  chloride, acetaminophen, alum & mag hydroxide-simeth, gi cocktail, hydrALAZINE, nitroGLYCERIN, ondansetron (ZOFRAN) IV, sodium chloride     Assessment/Plan    1. Chest pain - remote hx of CAD, reportedly possible PTCA 15 years ago - initial trop neg, repeat up to 0.26 trending up to 0.31. EKG inferior and lateral precordial ST depression, LVH with strain vs ischemia - unclear if troponin due to ACS or severe HTN on admission, SBP 220s. BP trending down after starting coreg and ACE-I - she is on heparin, atorva, asa, coreg, ace-I.  - echo LVEF 65-70%, severe LVH - lipid panel echo, Hgb A1c pending - plan for cath tomorrow.   2. HTN - non-compliant with meds, presented with severe HTN SBP in 220s - severe LVH by echo - home bp meds restarted, BP trending down but still elevated. Borderline low HRs, will not increase coreg. Start norvasc 5mg  daily. After cath can consider diuretic.    3. Heart murmur  - due to hyperdynamic LV along with severe LVH (no significant obstructive gradient), no valvular pathology on echo - she is on beta blocker   4. Hypokalemia - replaced yesterday, f/u repeat labs  Carlyle Dolly, M.D.

## 2014-10-08 NOTE — Progress Notes (Signed)
ANTICOAGULATION CONSULT NOTE - Initial Consult  Pharmacy Consult for Heparin Indication: chest pain/ACS  Allergies  Allergen Reactions  . Penicillins Itching    Patient Measurements: Weight: 256 lb 9.9 oz (116.4 kg)  Vital Signs: Temp: 97.7 F (36.5 C) (05/01 0445) Temp Source: Axillary (05/01 0445) Pulse Rate: 56 (05/01 0445)  Labs:  Recent Labs  10/06/14 2315 10/07/14 0442 10/07/14 1123 10/07/14 1650 10/07/14 2004 10/08/14 0940  HGB 15.5*  --   --   --   --  14.2  HCT 46.9*  --   --   --   --  45.1  PLT 156  --   --   --   --  146*  HEPARINUNFRC  --   --   --   --  0.89* 1.08*  CREATININE 0.84  --   --   --   --   --   TROPONINI  --  0.26* 0.28* 0.31*  --   --     CrCl cannot be calculated (Unknown ideal weight.).  Assessment: 59 yo F admitted 10/06/2014  With CP, and not with positive troponins.  Pharmacy consulted to dose heparin  Coag/Heme: ACS, to start heparin, patient denies recent bleeding ("I don't even have a period", surgery or blood thinning medications.  Heparin level remains elevated.  CBC stable, no bleeding noted  Goal of Therapy:  Heparin level 0.3-0.7 units/ml Monitor platelets by anticoagulation protocol: Yes   Plan:  Hold heparin x 1 h then decrease Heparin to 800 units/hr Check hepairn level 8h after ggt resumes  Thank you for allowing pharmacy to be a part of this patients care team.  Lovenia Kim Pharm.D., BCPS, AQ-Cardiology Clinical Pharmacist 10/08/2014 12:10 PM Pager: [phone] Phone: [phone]

## 2014-10-08 NOTE — Progress Notes (Addendum)
ANTICOAGULATION CONSULT NOTE Pharmacy Consult for Heparin Indication: chest pain/ACS  Allergies  Allergen Reactions  . Penicillins Itching    Patient Measurements: Weight: 256 lb 9.9 oz (116.4 kg)  Vital Signs: Temp: 99.3 F (37.4 C) (05/01 1954) Temp Source: Oral (05/01 1954) BP: 152/53 mmHg (05/01 1954) Pulse Rate: 67 (05/01 1954)  Labs:  Recent Labs  10/06/14 2315 10/07/14 0442 10/07/14 1123 10/07/14 1650 10/07/14 2004 10/08/14 0940 10/08/14 2205  HGB 15.5*  --   --   --   --  14.2  --   HCT 46.9*  --   --   --   --  45.1  --   PLT 156  --   --   --   --  146*  --   HEPARINUNFRC  --   --   --   --  0.89* 1.08* 0.28*  CREATININE 0.84  --   --   --   --   --   --   TROPONINI  --  0.26* 0.28* 0.31*  --   --   --     CrCl cannot be calculated (Unknown ideal weight.).  Assessment: 59 y.o. female with chest pain for heparin   Goal of Therapy:  Heparin level 0.3-0.7 units/ml Monitor platelets by anticoagulation protocol: Yes   Plan:  Increase Heparin 1000 units/hr Follow-up am labs.   Geannie Risen, PharmD, BCPS  Addendum: Am level 0.25  Increase Heparin 1200 units/hr F/U after cath today  Geannie Risen, PharmD, BCPS 10/09/2014 5:45 AM

## 2014-10-09 ENCOUNTER — Encounter (HOSPITAL_COMMUNITY)
Admission: EM | Disposition: A | Discharge status: Home / Self Care | Payer: Medicaid Other | Source: Home / Self Care | Attending: Cardiology

## 2014-10-09 DIAGNOSIS — R011 Cardiac murmur, unspecified: Secondary | ICD-10-CM

## 2014-10-09 HISTORY — PX: CARDIAC CATHETERIZATION: SHX172

## 2014-10-09 LAB — CBC
HEMATOCRIT: 42.7 % (ref 36.0–46.0)
Hemoglobin: 13.5 g/dL (ref 12.0–15.0)
MCH: 26.9 pg (ref 26.0–34.0)
MCHC: 31.6 g/dL (ref 30.0–36.0)
MCV: 85.1 fL (ref 78.0–100.0)
Platelets: 116 10*3/uL — ABNORMAL LOW (ref 150–400)
RBC: 5.02 MIL/uL (ref 3.87–5.11)
RDW: 17.8 % — ABNORMAL HIGH (ref 11.5–15.5)
WBC: 10.3 10*3/uL (ref 4.0–10.5)

## 2014-10-09 LAB — TROPONIN I: TROPONIN I: 0.22 ng/mL — AB (ref <0.031)

## 2014-10-09 LAB — PROTIME-INR
INR: 1 (ref 0.00–1.49)
PROTHROMBIN TIME: 13.3 s (ref 11.6–15.2)

## 2014-10-09 LAB — BASIC METABOLIC PANEL
Anion gap: 9 (ref 5–15)
BUN: 14 mg/dL (ref 6–20)
CO2: 22 mmol/L (ref 22–32)
CREATININE: 0.75 mg/dL (ref 0.44–1.00)
Calcium: 8.3 mg/dL — ABNORMAL LOW (ref 8.9–10.3)
Chloride: 110 mmol/L (ref 101–111)
GFR calc Af Amer: >60 mL/min (ref >60)
GFR calc non Af Amer: >60 mL/min (ref >60)
GLUCOSE: 96 mg/dL (ref 70–99)
POTASSIUM: 3.7 mmol/L (ref 3.5–5.1)
Sodium: 141 mmol/L (ref 135–145)

## 2014-10-09 LAB — HEMOGLOBIN A1C
Hgb A1c MFr Bld: 5.8 % — ABNORMAL HIGH (ref 4.8–5.6)
Mean Plasma Glucose: 120 mg/dL

## 2014-10-09 LAB — HEPARIN LEVEL (UNFRACTIONATED): Heparin Unfractionated: 0.25 IU/mL — ABNORMAL LOW (ref 0.30–0.70)

## 2014-10-09 SURGERY — LEFT HEART CATH AND CORONARY ANGIOGRAPHY
Anesthesia: LOCAL

## 2014-10-09 MED ORDER — SODIUM CHLORIDE 0.9 % IJ SOLN: 3.0000 mL | INTRAMUSCULAR | Status: DC | PRN

## 2014-10-09 MED ORDER — FENTANYL CITRATE (PF) 100 MCG/2ML IJ SOLN
INTRAMUSCULAR | Status: DC | PRN
  Administered 2014-10-09 (×2): 50 ug via INTRAVENOUS

## 2014-10-09 MED ORDER — LABETALOL HCL 5 MG/ML IV SOLN
INTRAVENOUS | Status: DC | PRN
  Administered 2014-10-09: 20 mg via INTRAVENOUS

## 2014-10-09 MED ORDER — LIDOCAINE HCL (PF) 1 % IJ SOLN
INTRAMUSCULAR | Status: AC
  Filled 2014-10-09: qty 30

## 2014-10-09 MED ORDER — SODIUM CHLORIDE 0.9 % IJ SOLN
3.0000 mL | Freq: Two times a day (BID) | INTRAMUSCULAR | Status: DC
  Administered 2014-10-11: 3 mL via INTRAVENOUS

## 2014-10-09 MED ORDER — HEPARIN (PORCINE) IN NACL 2-0.9 UNIT/ML-% IJ SOLN
INTRAMUSCULAR | Status: AC
  Filled 2014-10-09: qty 500

## 2014-10-09 MED ORDER — MIDAZOLAM HCL 2 MG/2ML IJ SOLN
INTRAMUSCULAR | Status: DC | PRN
  Administered 2014-10-09: 1 mg via INTRAVENOUS
  Administered 2014-10-09: 2 mg via INTRAVENOUS

## 2014-10-09 MED ORDER — HEPARIN SODIUM (PORCINE) 1000 UNIT/ML IJ SOLN
INTRAMUSCULAR | Status: AC
  Filled 2014-10-09: qty 1

## 2014-10-09 MED ORDER — VERAPAMIL HCL 2.5 MG/ML IV SOLN
INTRAVENOUS | Status: DC | PRN
  Administered 2014-10-09: 17:00:00 via INTRA_ARTERIAL

## 2014-10-09 MED ORDER — SODIUM CHLORIDE 0.9 % IV SOLN: 250.0000 mL | INTRAVENOUS | Status: DC | PRN

## 2014-10-09 MED ORDER — HEPARIN (PORCINE) IN NACL 2-0.9 UNIT/ML-% IJ SOLN
INTRAMUSCULAR | Status: AC
  Filled 2014-10-09: qty 1000

## 2014-10-09 MED ORDER — FENTANYL CITRATE (PF) 100 MCG/2ML IJ SOLN
INTRAMUSCULAR | Status: AC
  Filled 2014-10-09: qty 2

## 2014-10-09 MED ORDER — VERAPAMIL HCL 2.5 MG/ML IV SOLN
INTRAVENOUS | Status: AC
  Filled 2014-10-09: qty 2

## 2014-10-09 MED ORDER — MIDAZOLAM HCL 2 MG/2ML IJ SOLN
INTRAMUSCULAR | Status: AC
  Filled 2014-10-09: qty 2

## 2014-10-09 MED ORDER — IOHEXOL 350 MG/ML SOLN
INTRAVENOUS | Status: DC | PRN
  Administered 2014-10-09: 100 mL via INTRA_ARTERIAL

## 2014-10-09 MED ORDER — NITROGLYCERIN 1 MG/10 ML FOR IR/CATH LAB
INTRA_ARTERIAL | Status: AC
  Filled 2014-10-09: qty 10

## 2014-10-09 MED ORDER — LABETALOL HCL 5 MG/ML IV SOLN
INTRAVENOUS | Status: AC
  Filled 2014-10-09: qty 4

## 2014-10-09 MED ORDER — SODIUM CHLORIDE 0.9 % WEIGHT BASED INFUSION: 1.0000 mL/kg/h | INTRAVENOUS | Status: AC

## 2014-10-09 MED ORDER — HEPARIN SODIUM (PORCINE) 1000 UNIT/ML IJ SOLN
INTRAMUSCULAR | Status: DC | PRN
  Administered 2014-10-09: 6000 [IU] via INTRAVENOUS

## 2014-10-09 SURGICAL SUPPLY — 11 items
CATH INFINITI JR4 5F (CATHETERS) ×2 IMPLANT
CATH OPTITORQUE TIG 4.0 5F (CATHETERS) ×2 IMPLANT
DEVICE RAD COMP TR BAND LRG (VASCULAR PRODUCTS) ×2 IMPLANT
GLIDESHEATH SLEND SS 6F .021 (SHEATH) ×2 IMPLANT
HOVERMATT SINGLE USE (MISCELLANEOUS) ×2 IMPLANT
KIT HEART LEFT (KITS) ×2 IMPLANT
PACK CARDIAC CATHETERIZATION (CUSTOM PROCEDURE TRAY) ×2 IMPLANT
SYR MEDRAD MARK V 150ML (SYRINGE) IMPLANT
TRANSDUCER W/STOPCOCK (MISCELLANEOUS) ×2 IMPLANT
TUBING CIL FLEX 10 FLL-RA (TUBING) ×2 IMPLANT
WIRE SAFE-T 1.5MM-J .035X260CM (WIRE) ×2 IMPLANT

## 2014-10-09 NOTE — Progress Notes (Signed)
Patient Name: [name] Date of Encounter: 10/09/2014   SUBJECTIVE  Denies CP or SOB. No complains.   CURRENT MEDS . amLODipine  5 mg Oral Daily  . antiseptic oral rinse  7 mL Mouth Rinse BID  . [START ON 10/10/2014] aspirin EC  81 mg Oral Daily  . atorvastatin  80 mg Oral q1800  . carvedilol  6.25 mg Oral BID WC  . lisinopril  20 mg Oral Daily  . pantoprazole  40 mg Oral Daily  . pneumococcal 23 valent vaccine  0.5 mL Intramuscular Tomorrow-1000  . sodium chloride  3 mL Intravenous Q12H  . sodium chloride  3 mL Intravenous Q12H    OBJECTIVE  Filed Vitals:   10/08/14 0445 10/08/14 1419 10/08/14 1954 10/09/14 0347  BP:  149/59 152/53 185/66  Pulse: 56 70 67 70  Temp: 97.7 F (36.5 C) 98.9 F (37.2 C) 99.3 F (37.4 C) 98.2 F (36.8 C)  TempSrc: Axillary Oral Oral Oral  Resp: Weight:      SpO2: 94% 100% 100% 100%    Intake/Output Summary (Last 24 hours) at 10/09/14 1030 Last data filed at 10/09/14 0900  Gross per 24 hour  Intake    240 ml  Output      0 ml  Net    240 ml   Filed Weights   10/07/14 0412  Weight: 256 lb 9.9 oz (116.4 kg)    PHYSICAL EXAM  General: Pleasant, obese women, laying comfortable in bed.  Neuro: Alert and oriented X 3. Moves all extremities spontaneously. Psych: Normal affect. HEENT:  Normal  Neck: Supple without bruits or JVD. Lungs:  Resp regular and unlabored. Limited breathing effort due to body habituate.  Heart: RRR. Systolic murmur.  Abdomen: Soft, non-tender, non-distended, BS + x 4.  Extremities: No clubbing, cyanosis or edema. DP/PT/Radials 2+ and equal bilaterally.  Accessory Clinical Findings  CBC  Recent Labs  10/06/14 2315 10/08/14 0940 10/09/14 0417  WBC 10.4 8.8 10.3  NEUTROABS 6.2  --   --   HGB 15.5* 14.2 13.5  HCT 46.9* 45.1 42.7  MCV 81.8 84.1 85.1  PLT 156 146* 116*   Basic Metabolic Panel  Recent Labs  10/06/14 2315 10/09/14 0417  NA 144 141  K 2.9* 3.7  CL 108 110   CO2 20 22  GLUCOSE 104* 96  BUN 10 14  CREATININE 0.84 0.75  CALCIUM 9.0 8.3*     Recent Labs  10/07/14 1123 10/07/14 1650 10/09/14 0417  TROPONINI 0.28* 0.31* 0.22*   Fasting Lipid Panel  Recent Labs  10/07/14 0442  CHOL 154  HDL 46  LDLCALC 86  TRIG 108  CHOLHDL 3.3    TELE  Sinus Rhythm with HR 50 to 70. Occasional PVCs.   2D echo 10/07/2014 Study Conclusions  - Left ventricle: The cavity size was normal. Wall thickness was increased in a pattern of severe LVH. Systolic function was vigorous. The estimated ejection fraction was in the range of 65% to 70%. Wall motion was normal; there were no regional wall motion abnormalities. - Aortic valve: Valve area (VTI): 2.11 cm^2. Valve area (Vmax): 2.2 cm^2. - Left atrium: The atrium was mildly dilated. - Technically difficult study. Echocontrast is used to enhance visualization.  Radiology/Studies  Dg Chest 2 View  10/07/2014   CLINICAL DATA:  Acute onset of central chest pain. Initial encounter.  EXAM: CHEST  2 VIEW  COMPARISON:  None.  FINDINGS: The lungs are  well-aerated and clear. There is no evidence of focal opacification, pleural effusion or pneumothorax.  The heart is mildly enlarged. No acute osseous abnormalities are seen.  IMPRESSION: Mild cardiomegaly; lungs remain grossly clear.   Electronically Signed   By: Roanna Raider M.D.   On: 10/07/2014 01:02    ASSESSMENT AND PLAN Active Problems:   ACS (acute coronary syndrome)    1. Chest pain - remote hx of CAD, reportedly possible PTCA 15 years ago - initial trop neg, repeat 0.28->0.31->0.22. EKG inferior and lateral precordial ST depression, LVH with strain vs ischemia - unclear if troponin due to ACS or severe HTN on admission, SBP 220s. BP trending down after starting coreg and ACE-I - she is on heparin, atorva, asa, coreg, lisinopril.  - echo LVEF 65-70%, severe LVH - 10/07/2014: Cholesterol, Total 154; HDL-C 46; LDL (calc) 86;  Triglycerides 108; VLDL 22 - Adjust statin dose post cath. - TSH normal, HgbA1C pending - Cath today 1330 by Dr. Clifton James. NPO, received ASA.   2. HTN - non-compliant with meds, presented with severe HTN SBP in 220s - severe LVH by echo - home bp meds restarted, BP trending down but still elevated. Borderline low HRs, will not increase coreg per Dr. Wyline Mood.  Started norvasc 5mg  daily yesterday. After cath can consider diuretic.    3. Heart murmur  - due to hyperdynamic LV along with severe LVH (no significant obstructive gradient), no valvular pathology on echo - she is on beta blocker   4. Hypokalemia - on admission. Resolved.   5. Obesity Body mass index is 46.92 kg/(m^2).  Signed, Bhagat,Bhavinkumar PA-C Pager [phone]  I have seen and examined the patient along with Bhagat,Bhavinkumar PA-C.  I have reviewed the chart, notes and new data.  I agree with PA's note.  Key new complaints: currently asymptomatic Key examination changes: no overt CHF Key new findings / data: normal echo wall motion;   PLAN: Coronary angio today. Quite possible her symptoms and enzyme leak were related to extreme BP elevation.  Thurmon Fair, MD, [facility] [phone] 10/09/2014, 12:12 PM

## 2014-10-09 NOTE — Progress Notes (Signed)
PT back from cath lab to 2w39, alert and oriented, vitals stable, pt educated & instructed on how to keep the rt arm steady. Pt non compliant with instructions, trying to pull and lift stuff using the rt arm, RN continue to re-enforced the importance of keeping that arm steady. Will continue to monitor pt for safety

## 2014-10-09 NOTE — Progress Notes (Signed)
Patient remains non-complaint with constantly using arm with TI band in it. She will not allow staff to remove air from band. She will not keep O2 on face. She is consistently trying to get out of bed to get her purse and trying to get over rail in bed. This Clinical research associate, Press photographer, and other staff have been in the room to calm patient down, she remains noncompliant.Notified nurse on dayshift of non-compliance. Notified Vora for orders. Will continue to monitor at this time.

## 2014-10-10 ENCOUNTER — Encounter (HOSPITAL_COMMUNITY): Payer: Self-pay | Admitting: Cardiology

## 2014-10-10 DIAGNOSIS — I25119 Atherosclerotic heart disease of native coronary artery with unspecified angina pectoris: Secondary | ICD-10-CM

## 2014-10-10 DIAGNOSIS — I10 Essential (primary) hypertension: Secondary | ICD-10-CM

## 2014-10-10 MED ORDER — AMLODIPINE BESYLATE 10 MG PO TABS
10.0000 mg | ORAL_TABLET | Freq: Every day | ORAL | Status: DC
  Administered 2014-10-11: 10 mg via ORAL
  Filled 2014-10-10: qty 1

## 2014-10-10 MED ORDER — AMLODIPINE BESYLATE 5 MG PO TABS
5.0000 mg | ORAL_TABLET | Freq: Once | ORAL | Status: AC
  Administered 2014-10-10: 5 mg via ORAL
  Filled 2014-10-10: qty 1

## 2014-10-10 MED ORDER — LISINOPRIL 20 MG PO TABS: 20.0000 mg | ORAL_TABLET | Freq: Every day | ORAL | Status: DC

## 2014-10-10 MED ORDER — ATORVASTATIN CALCIUM 20 MG PO TABS: 20.0000 mg | ORAL_TABLET | Freq: Every morning | ORAL | Status: DC

## 2014-10-10 MED ORDER — AMLODIPINE BESYLATE 10 MG PO TABS: 10.0000 mg | ORAL_TABLET | Freq: Every day | ORAL | Status: DC

## 2014-10-10 MED ORDER — CARVEDILOL 6.25 MG PO TABS: 6.2500 mg | ORAL_TABLET | Freq: Two times a day (BID) | ORAL | Status: DC

## 2014-10-10 MED ORDER — ASPIRIN 81 MG PO TBEC: 81.0000 mg | DELAYED_RELEASE_TABLET | Freq: Every day | ORAL | Status: DC

## 2014-10-10 NOTE — Evaluation (Signed)
Physical Therapy Evaluation and Discharge  Patient Details Name: [name] MRN: [id number] DOB: 04/03/1956 Today's Date: 10/10/2014   History of Present Illness  Pt is a 59 y/o female who presents to the ED with chest pain. Per pt she has a history of CAD, prior MI, and 2 CVA's. She walks minimal distances at baseline and pushes a w/c for support. When resting spends most of her time in the w/c.  Clinical Impression  Patient evaluated by Physical Therapy with no further acute PT needs identified. All education has been completed and the patient has no further questions. At the time of PT eval pt was able to perform transfers with guarding for safety, but realistically at a supervision level. Pt reports that at home she spends most of her time in the w/c and she feels she is at her baseline of function. Pt declines further acute PT or follow-up PT after d/c. See below for any follow-up Physial Therapy or equipment needs. PT is signing off. Thank you for this referral.     Follow Up Recommendations No PT follow up    Equipment Recommendations  None recommended by PT    Recommendations for Other Services       Precautions / Restrictions Precautions Precautions: Fall Restrictions Weight Bearing Restrictions: No      Mobility  Bed Mobility Overal bed mobility: Needs Assistance Bed Mobility: Supine to Sit     Supine to sit: Supervision     General bed mobility comments: Pt was able to transition to EOB without physical assistance. Prefers to get up on the R side of the bed.   Transfers Overall transfer level: Needs assistance Equipment used: Rolling walker (2 wheeled) Transfers: Sit to/from [employer] Sit to Stand: Min guard Stand pivot transfers: Min guard       General transfer comment: Close guard for safety, however no physical assistance required for pt to transition bed>chair.   Ambulation/Gait             General Gait Details: Pt  declines further ambulation at this time.   Stairs            Wheelchair Mobility    Modified Rankin (Stroke Patients Only)       Balance Overall balance assessment: Needs assistance Sitting-balance support: Feet supported;No upper extremity supported Sitting balance-Leahy Scale: Fair     Standing balance support: Bilateral upper extremity supported;During functional activity Standing balance-Leahy Scale: Poor Standing balance comment: Pt reports that she requires UE support to maintain balance at this time                              Pertinent Vitals/Pain Pain Assessment: No/denies pain    Home Living Family/patient expects to be discharged to:: Private residence Living Arrangements: Other relatives Available Help at Discharge: Family;Available PRN/intermittently Type of Home: House Home Access: Ramped entrance     Home Layout: One level Home Equipment: Wheelchair - manual      Prior Function Level of Independence: Needs assistance   Gait / Transfers Assistance Needed: Pt states she was independent as long as she had her w/c to push. Was walking minimal distances in home only.   ADL's / Homemaking Assistance Needed: Pt reports she needs assist to complete ADL tasks        Hand Dominance        Extremity/Trunk Assessment   Upper Extremity Assessment: Defer to OT evaluation  Lower Extremity Assessment: Generalized weakness      Cervical / Trunk Assessment: Normal  Communication   Communication: No difficulties  Cognition Arousal/Alertness: Awake/alert Behavior During Therapy: WFL for tasks assessed/performed Overall Cognitive Status: Within Functional Limits for tasks assessed                      General Comments      Exercises        Assessment/Plan    PT Assessment Patent does not need any further PT services  PT Diagnosis Difficulty walking;Generalized weakness   PT Problem List    PT Treatment  Interventions     PT Goals (Current goals can be found in the Care Plan section) Acute Rehab PT Goals PT Goal Formulation: All assessment and education complete, DC therapy    Frequency     Barriers to discharge        Co-evaluation               End of Session Equipment Utilized During Treatment: Gait belt;Oxygen Activity Tolerance: Patient tolerated treatment well Patient left: in chair;with call bell/phone within reach Nurse Communication: Mobility status         Time: 1012-1035 PT Time Calculation (min) (ACUTE ONLY): 23 min   Charges:   PT Evaluation $Initial PT Evaluation Tier I: 1 Procedure PT Treatments $Therapeutic Activity: 8-22 mins   PT G Codes:        Conni Slipper 11-03-14, 1:43 PM   Conni Slipper, PT, DPT Acute Rehabilitation Services Pager: [phone]

## 2014-10-10 NOTE — Progress Notes (Signed)
Patient refused her lab draws this am per phlebotomist, will continue to monitor.

## 2014-10-10 NOTE — Progress Notes (Signed)
OT Cancellation Note  Patient Details Name: [name] MRN: [id number] DOB: 03/24/1956   Cancelled Treatment:    Reason Eval/Treat Not Completed: Medical issues which prohibited therapy. Holding OT evaluation due to pt's very high BP-spoke with nurse and she suggested waiting.  Earlie Raveling OTR/L [phone] 10/10/2014, 3:20 PM

## 2014-10-10 NOTE — Progress Notes (Signed)
PT Cancellation Note  Patient Details Name: [name] MRN: [id number] DOB: [birth year]   Cancelled Treatment:    Reason Eval/Treat Not Completed: Patient declined, no reason specified. Pt ignoring therapist during encounter, turning head away and refusing to speak after therapist suggested OOB for breakfast. Will check back as schedule allows.   Conni Slipper 10/10/2014, 7:54 AM   Conni Slipper, PT, DPT Acute Rehabilitation Services Pager: [phone]

## 2014-10-10 NOTE — Progress Notes (Addendum)
Nurse called and reported pt BP of 214/71. Given hydralazine IV 10mg . Unable to obtain manual BP because manual large BP cuff was unavailablable. I have personally seen pt. Pt was sitting on commode. She denies any chest pain, SOB, palpitation or HA. She stated that her BP cuff was small and very tight. Her repeat BP was 166/92 (on machine with large cuff) when I was in room. Ordered additional Norvasc 5mg  once. Will wait on discharge until better blood pressure control, SW and CM reports.  Pt has missed yesterday's dose of Coreg both times. Likely rebound.

## 2014-10-10 NOTE — Progress Notes (Signed)
Pt. BP elevated to 214/92, Hydralyzine prn administered, bp came back down to 167/92 manually, PA-C  Bhagat B. Notified, he stopedp by to see the pt, new orders written, plan is to d/c pt home when bp stabilize.

## 2014-10-10 NOTE — Care Management Note (Signed)
  Page 1 of 1   10/10/2014     4:45:10 PM CARE MANAGEMENT NOTE 10/10/2014  Patient:  CYTHINA, HERSHNER   Account Number:  [id number]  Date Initiated:  10/10/2014  Documentation initiated by:  Donn Pierini  Subjective/Objective Assessment:   Pt admitted with ACS/HTN     Action/Plan:   PTA pt lived at home with family   Anticipated DC Date:  10/11/2014   Anticipated DC Plan:  HOME/SELF CARE      DC Planning Services  CM consult      Choice offered to / List presented to:             Status of service:  In process, will continue to follow Medicare Important Message given?   (If response is "NO", the following Medicare IM given date fields will be blank) Date Medicare IM given:   Medicare IM given by:   Date Additional Medicare IM given:   Additional Medicare IM given by:    Discharge Disposition:  HOME/SELF CARE  Per UR Regulation:  Reviewed for med. necessity/level of care/duration of stay  If discussed at Long Length of Stay Meetings, dates discussed:    Comments:  10/10/14- 1630- Donn Pierini RN, BSN [phone] Spoke with pt at bedside- per pt she plans to d/c home with daughter Almira Coaster- pt gave this CM phone # to daughter Britta Mccreedy [phone]) call made to this # but no answer- msg left for return call- call also made to # in chart for Amy- however- pt's daughter Almira Coaster answered at that #- confirmed with pt's daughter Almira Coaster- that plan was for pt to d/c home with her- and that pt would have transportation home for tomorrow- pt's BP currently elevated and MD adjusting meds- explained to pt and daughter that once pt was medically cleared for discharge that pt would have no further reason to stay in the hospital- pt understands that if she stays without a medical reason she may be responsible for that portion of her bill. Also discussed home 02 with pt- and explained at this time pt does not meet qualifying guidelines for insurance to cover home 02--

## 2014-10-10 NOTE — Progress Notes (Signed)
Patient Name: [name] Date of Encounter: 10/10/2014   SUBJECTIVE  The patient remains non complaint overnight and this AM. She did not participate in PT. Denies CP or SOB. Pt requested home oxygen and additional night stay.   CURRENT MEDS . amLODipine  5 mg Oral Daily  . antiseptic oral rinse  7 mL Mouth Rinse BID  . aspirin EC  81 mg Oral Daily  . atorvastatin  80 mg Oral q1800  . carvedilol  6.25 mg Oral BID WC  . lisinopril  20 mg Oral Daily  . pantoprazole  40 mg Oral Daily  . pneumococcal 23 valent vaccine  0.5 mL Intramuscular Tomorrow-1000  . sodium chloride  3 mL Intravenous Q12H  . sodium chloride  3 mL Intravenous Q12H    OBJECTIVE  Filed Vitals:   10/09/14 1816 10/09/14 2000 10/09/14 2010 10/10/14 0656  BP: 157/76  110/46 155/64  Pulse: 67  93 77  Temp: 98.5 F (36.9 C)  99 F (37.2 C) 97.9 F (36.6 C)  TempSrc: Oral  Oral Oral  Resp: Weight:      SpO2: 97%  96% 100%    Intake/Output Summary (Last 24 hours) at 10/10/14 1049 Last data filed at 10/10/14 0900  Gross per 24 hour  Intake    120 ml  Output      1 ml  Net    119 ml   Filed Weights   10/07/14 0412  Weight: 256 lb 9.9 oz (116.4 kg)    PHYSICAL EXAM  General: Pleasant, obese women, laying comfortable in bed.  Neuro: Alert and oriented X 3. Moves all extremities spontaneously. Psych: Normal affect. HEENT:  Normal  Neck: Supple without bruits or JVD. Lungs:  Resp regular and unlabored. Limited breathing effort due to body habituate.  Heart: RRR. Systolic murmur.  Abdomen: Soft, non-tender, non-distended, BS + x 4.  Extremities: No clubbing, cyanosis or edema. DP/PT/Radials 2+ and equal bilaterally. R radial cath site has dressing, no erythema, edema or bruit.  Accessory Clinical Findings  CBC  Recent Labs  10/08/14 0940 10/09/14 0417  WBC 8.8 10.3  HGB 14.2 13.5  HCT 45.1 42.7  MCV 84.1 85.1  PLT 146* 116*   Basic Metabolic Panel  Recent Labs  10/09/14 0417  NA 141  K 3.7  CL 110  CO2 22  GLUCOSE 96  BUN 14  CREATININE 0.75  CALCIUM 8.3*     Recent Labs  10/07/14 1123 10/07/14 1650 10/09/14 0417  TROPONINI 0.28* 0.31* 0.22*     TELE  Sinus Rhythm with HR 50 to 70. Occasional PVCs.   2D echo 10/07/2014 Study Conclusions  - Left ventricle: The cavity size was normal. Wall thickness was increased in a pattern of severe LVH. Systolic function was vigorous. The estimated ejection fraction was in the range of 65% to 70%. Wall motion was normal; there were no regional wall motion abnormalities. - Aortic valve: Valve area (VTI): 2.11 cm^2. Valve area (Vmax): 2.2 cm^2. - Left atrium: The atrium was mildly dilated. - Technically difficult study. Echocontrast is used to enhance visualization.  Cath 10/09/14 Conclusion     Angiographicall normal Coronary Arteries with moderate disease in an RPL branch - too distal for PCI  3rd RPLB lesion, 50% stenosed.  Hyperdynamic LV with near cavity obliteration & severely elevated LVEDP  Severe systemic HTN  Etiology for + Troponin is most likely related to Accelerated HTN. No Angiographically significant CAD. Hyperdynamic LV  with Severe Systolic HTN & LVEDP of ~30-35 mmHg - but near cavity obliteration   Plan: Standard post Radial Cath Care with TR Band removal. Aggressive BP management  Discharge planning per primary team - once BP controlled.     Coronary Findings    Dominance: Left   Left Anterior Descending  The vessel was , is large is angiographically normal.   . First Diagonal Branch   The vessel is moderate in size and is angiographically normal.   . Second Diagonal Branch   The vessel is large in size and is angiographically normal.   . Third Diagonal Branch   The vessel is angiographically normal.     Left Circumflex  The vessel was , is large . Terminates as small AV Groove branch - LPL1   . First Obtuse Marginal Branch   The  vessel is large in size. Bifurcates into 2 moderate caliber OM branches   . Second Obtuse Marginal Branch   The vessel is large in size and is angiographically normal. Bifurcates into 2 OM branches - moderate caliber     Right Coronary Artery  The vessel was , is large is angiographically normal. The vessel is tortuous.   . Acute Marginal Branch   The vessel is moderate in size.   . Right Posterior Descending Artery   The vessel is moderate in size and is angiographically normal.   . Right Posterior Atrioventricular Branch   The vessel is moderate in size and exhibits minimal luminal irregularities.   . Third Right Posterolateral   . 3rd RPLB lesion, 50% stenosed. tubular .       ASSESSMENT AND PLAN Principal Problem:   ACS (acute coronary syndrome) Active Problems:   Morbid obesity   Coronary artery disease involving native coronary artery of native heart with angina pectoris   Accelerated hypertension    1. Chest pain - remote hx of CAD, reportedly possible PTCA 15 years ago - initial trop neg, repeat 0.28->0.31->0.22. EKG inferior and lateral precordial ST depression, LVH with strain vs ischemia - unclear if troponin due to ACS or severe HTN on admission, SBP 220s. BP trending down after starting coreg and ACE-I - she is on atorva, asa, coreg, lisinopril, norvasc - echo LVEF 65-70%, severe LVH - TSH normal, HgbA1C 5.8 - Cath showed angiographically normal Coronary Arteries with moderate disease in an RPL branch - too distal for PCI; 3rd RPLB lesion, 50% stenosed; hyperdynamic LV with near cavity obliteration & severely elevated LVEDP. -10/07/2014: Cholesterol, Total 154; HDL-C 46; LDL (calc) 86; Triglycerides 108; VLDL 22. MD advice on decreased or discontinue on statin.   2. HTN - non-compliant with meds, presented with severe HTN SBP in 220s - severe LVH by echo - home bp meds restarted, BP trending down. Borderline low HRs, will not increase coreg per Dr. Wyline Mood. BP  improved today.   -Continue Norvasc 5mg , ASA 81mg , Coreg 6.25mg  (did not received any dose yesterday), Lisinopril 20mg . Add diuretics?    3. Heart murmur  - due to hyperdynamic LV along with severe LVH (no significant obstructive gradient), no valvular pathology on echo - she is on beta blocker   4. Hypokalemia - on admission. Resolved.   5. Obesity Body mass index is 46.92 kg/(m^2).    Dispo: Pt requested home oxygen and additional night stay. She is noncompliant. Recommended PT evaluation before discharge. Might benefit from reducing coreg 3.125 BID on discharge considering sinus bradycardia. Add diuretics? Home does of amlodipine 10mg , and  lisinopril . Further management per MD. I emphasises importance of wt loss and being active. She seems not interested.   Signed, Bhagat,Bhavinkumar PA-C Pager [phone]  I have seen and examined the patient along with Bhagat,Bhavinkumar PA-C.  I have reviewed the chart, notes and new data. I agree with PA's note.  Continue carvedilol  - she needs a beta blocker for her hyperdynamic systolic function. Bradycardia is not symptomatic.  Apparently, her HTNive urgency was related to cessation of her BP meds, after she was unable to see her physician. She states he "has gone back to [place]".  Medically ready for DC, but apparently there are housing/social issues that prevent her discharge. Will have social Investment banker, operational look into it.   Thurmon Fair, MD, [facility] [phone] 10/10/2014, 1:03 PM

## 2014-10-10 NOTE — Discharge Summary (Signed)
Discharge Summary   Patient ID: [name],  MRN: [id number], DOB/AGE: July 13, 1955 59 y.o.  Admit date: 10/06/2014 Discharge date: 10/11/2014  Primary Care Provider: [facility] Primary Cardiologist: Dr. Eden Emms (per prior notes)  Discharge Diagnoses Principal Problem:   ACS (acute coronary syndrome) Active Problems:   Morbid obesity   Coronary artery disease involving native coronary artery of native heart with angina pectoris   Accelerated hypertension   Diverticular disease of large intestine   Coronary artery disease   Stroke   Non-ischemic cardiomyopathy   Allergies Allergies  Allergen Reactions  . Penicillins Itching    Procedures 2D echo 10/07/14 Study Conclusions  - Left ventricle: The cavity size was normal. Wall thickness was increased in a pattern of severe LVH. Systolic function was vigorous. The estimated ejection fraction was in the range of 65% to 70%. Wall motion was normal; there were no regional wall motion abnormalities. - Aortic valve: Valve area (VTI): 2.11 cm^2. Valve area (Vmax): 2.2 cm^2. - Left atrium: The atrium was mildly dilated. - Technically difficult study. Echocontrast is used to enhance visualization.  Cath 10/09/14 Conclusion     Angiographicall normal Coronary Arteries with moderate disease in an RPL branch - too distal for PCI  3rd RPLB lesion, 50% stenosed.  Hyperdynamic LV with near cavity obliteration & severely elevated LVEDP  Severe systemic HTN  Etiology for + Troponin is most likely related to Accelerated HTN. No Angiographically significant CAD. Hyperdynamic LV with Severe Systolic HTN & LVEDP of ~30-35 mmHg - but near cavity obliteration   Plan: Standard post Radial Cath Care with TR Band removal. Aggressive BP management  Discharge planning per primary team - once BP controlled.     Coronary Findings    Dominance: Left   Left Anterior Descending  The vessel was , is large  is angiographically normal.   . First Diagonal Branch   The vessel is moderate in size and is angiographically normal.   . Second Diagonal Branch   The vessel is large in size and is angiographically normal.   . Third Diagonal Branch   The vessel is angiographically normal.     Left Circumflex  The vessel was , is large . Terminates as small AV Groove branch - LPL1   . First Obtuse Marginal Branch   The vessel is large in size. Bifurcates into 2 moderate caliber OM branches   . Second Obtuse Marginal Branch   The vessel is large in size and is angiographically normal. Bifurcates into 2 OM branches - moderate caliber     Right Coronary Artery  The vessel was , is large is angiographically normal. The vessel is tortuous.   . Acute Marginal Branch   The vessel is moderate in size.   . Right Posterior Descending Artery   The vessel is moderate in size and is angiographically normal.   . Right Posterior Atrioventricular Branch   The vessel is moderate in size and exhibits minimal luminal irregularities.   . Third Right Posterolateral   . 3rd RPLB lesion, 50% stenosed. tubular .          History of Present Illness  [name] is a 59 year old female with a history of CAD by her report, cath 15 years ago and possibly had balloon angioplasty. Also has hx of HTN (noncompliant with medications), stroke, asthma and current tobacco smoker. Previously seen by Dr. Eden Emms many years ago. She presented to [facility] 4/30 with chest pain and malignant hypertension. She has  been off of her HTN meds for at least 3 months. She was unable to provider more information on her heart attack. She also has had 2 strokes. She is wheelchair bound because of knee issues and spends most of her time watching TV and smoking. Her chest pain started while eating dinner and relieved by maalox. The pain was sharp and in the center of her chest and her abdomen. There was  no associated syncope or diaphoresis. She denies any recent history of rest or exertional chest pain. Her ECG shows ST depression diffusely and her POC troponin was within normal. CXR showed mild cardiomegaly. She was admitted for management of HTN, murmur and regimen of her CAD.   Hospital Course  She was admitted to telemetery and lisinorpil and coreg was restarted. He was also given IV labetalol and hydralazine and her BP was improved. She was anticoagulated with heparin. Her potassium was 2.9 on admission was resolved with supplement.  BP continues to trended down and coreg 6.25mg  BID was not increased due to borderline low HRs. Norvasc 5 mg started which was half the home dose pre cath.  Echo showed severe LVH with LVEF 65-70%. Her heart murmur is likely due to hyperdynamic LV along with severe LVH (no significant obstructive gradient), no valvular pathology on echo. She remained symptoms free. Her trop trend was 0.26-->0.28-->0.31-->0.22. She was taken to cath lab due to unclear etiology of her elevated trop, either ACS or server HTN on admission. Cath showed angiographically normal Coronary Arteries with moderate disease in an RPL branch - too distal for PCI; 3rd RPLB lesion, 50% stenosed; hyperdynamic LV with near cavity obliteration & severely elevated LVEDP. Post cath pt declined PT therapy and refused to draws her labs. She was also remained noncompliant to nurses. She missed dose of coreg twice on cath day. Following day of her cath, she had a rebound hypertension of 214/71. She was given IV hydralazine  and extra dose of norvasc 5 mg once. Her BP was improved with additional medications.  Prior to her discharge she was  given amlodipine  , lisinopril , Imdur  and aspirin  and coreg 6.25mg  in morning. She advice to take PM does of coreg and lipitor . Her TSH normal, HgbA1C 5.8, Cholesterol, Total 154; HDL-C 46; LDL (calc) 86; Triglycerides 108; VLDL 22. She advised to find a  new PCP for medical management of her multiple problems including medication management and to avoid readmission.   Discharge Vitals Blood pressure 148/74, pulse 76, temperature 98.2 F (36.8 C), temperature source Oral, resp. rate 20, weight 256 lb 9.9 oz (116.4 kg), SpO2 98 %.  Filed Weights   10/07/14 0412  Weight: 256 lb 9.9 oz (116.4 kg)   Labs  CBC  Recent Labs  10/09/14 0417 10/11/14 0320  WBC 10.3 9.3  HGB 13.5 14.1  HCT 42.7 43.8  MCV 85.1 84.2  PLT 116* 132*   Basic Metabolic Panel  Recent Labs  10/09/14 0417  NA 141  K 3.7  CL 110  CO2 22  GLUCOSE 96  BUN 14  CREATININE 0.75  CALCIUM 8.3*   Cardiac Enzymes  Recent Labs  10/09/14 0417  TROPONINI 0.22*    Disposition  Pt is being discharged home today in good condition.   Follow-up Plans & Appointments  Follow-up Information    Follow up with Tereso Newcomer, PA-C On 11/08/2014.   Specialty:  Physician Assistant   Why:  @ 11:50 for post hospital   Contact  information:   [address] [place] [address] [phone]          Discharge Instructions    Diet - low sodium heart healthy    Complete by:  As directed      Increase activity slowly    Complete by:  As directed   NO HEAVY LIFTING (>10lbs) X 1 WEEKS. NO SEXUAL ACTIVITY X 1 WEEKS. NO DRIVING X 2 DAYS.  NO SOAKING BATHS, HOT TUBS, POOLS, ETC., X 7 DAYS.         --Witting Rx of amlodipine, atorvastatin, carvedilol, Imdur and lisinopril was given.  --Start amlodipine, lisinopril, Imdur and aspirin tomorrow. Take Lipitor tonight. Take 2nd dose of carvedilol evening today.  - Find new PCP for medical management of multiple problems and to avoid readmission.   -Outstanding labs: BMP and LFT as her statin has changed. Last LFT was normal in 04/2012.  Discharge Medications    Medication List    STOP taking these medications        isosorbide dinitrate 30 MG tablet  Commonly known as:  ISORDIL     potassium  chloride 10 MEQ tablet  Commonly known as:  K-DUR     simvastatin 40 MG tablet  Commonly known as:  ZOCOR      TAKE these medications        amLODipine 10 MG tablet  Commonly known as:  NORVASC  Take 1 tablet (10 mg total) by mouth daily.     aspirin 81 MG EC tablet  Take 1 tablet (81 mg total) by mouth daily.     atorvastatin 20 MG tablet  Commonly known as:  LIPITOR  Take 1 tablet (20 mg total) by mouth every morning.     carvedilol 6.25 MG tablet  Commonly known as:  COREG  Take 1 tablet (6.25 mg total) by mouth 2 (two) times daily with a meal.     cyclobenzaprine 10 MG tablet  Commonly known as:  FLEXERIL  Take 10 mg by mouth 3 (three) times daily as needed. For muscle spasms     docusate sodium 100 MG capsule  Commonly known as:  COLACE  Take 100 mg by mouth 2 (two) times daily.     doxazosin 4 MG tablet  Commonly known as:  CARDURA  Take 4 mg by mouth at bedtime.     HYDROcodone-acetaminophen 10-325 MG per tablet  Commonly known as:  NORCO  Take 1 tablet by mouth every 6 (six) hours as needed.     isosorbide mononitrate 30 MG 24 hr tablet  Commonly known as:  IMDUR  Take 1 tablet (30 mg total) by mouth daily.     lisinopril 20 MG tablet  Commonly known as:  PRINIVIL,ZESTRIL  Take 1 tablet (20 mg total) by mouth daily.     lubiprostone 24 MCG capsule  Commonly known as:  AMITIZA  Take 24 mcg by mouth 2 (two) times daily with a meal.     phenylephrine 0.25 % suppository  Commonly known as:  (USE for PREPARATION-H)  Place 1 suppository rectally 2 (two) times daily.     traMADol 50 MG tablet  Commonly known as:  ULTRAM  Take 50 mg by mouth every 6 (six) hours as needed.     traZODone 100 MG tablet  Commonly known as:  DESYREL  Take 100 mg by mouth at bedtime.        Duration of Discharge Encounter   Greater than 30  minutes including physician time.  Signed, Stephannie Broner PA-C 10/11/2014, 12:02 PM

## 2014-10-11 ENCOUNTER — Encounter (HOSPITAL_COMMUNITY): Payer: Self-pay | Admitting: Physician Assistant

## 2014-10-11 DIAGNOSIS — I2510 Atherosclerotic heart disease of native coronary artery without angina pectoris: Secondary | ICD-10-CM | POA: Diagnosis present

## 2014-10-11 DIAGNOSIS — K5730 Diverticulosis of large intestine without perforation or abscess without bleeding: Secondary | ICD-10-CM | POA: Diagnosis present

## 2014-10-11 DIAGNOSIS — I428 Other cardiomyopathies: Secondary | ICD-10-CM

## 2014-10-11 DIAGNOSIS — I639 Cerebral infarction, unspecified: Secondary | ICD-10-CM | POA: Diagnosis present

## 2014-10-11 LAB — CBC
HCT: 43.8 % (ref 36.0–46.0)
Hemoglobin: 14.1 g/dL (ref 12.0–15.0)
MCH: 27.1 pg (ref 26.0–34.0)
MCHC: 32.2 g/dL (ref 30.0–36.0)
MCV: 84.2 fL (ref 78.0–100.0)
Platelets: 132 10*3/uL — ABNORMAL LOW (ref 150–400)
RBC: 5.2 MIL/uL — ABNORMAL HIGH (ref 3.87–5.11)
RDW: 17.7 % — ABNORMAL HIGH (ref 11.5–15.5)
WBC: 9.3 10*3/uL (ref 4.0–10.5)

## 2014-10-11 MED ORDER — ISOSORBIDE MONONITRATE ER 30 MG PO TB24
30.0000 mg | ORAL_TABLET | Freq: Every day | ORAL | Status: DC
  Administered 2014-10-11: 30 mg via ORAL
  Filled 2014-10-11 (×2): qty 1

## 2014-10-11 MED ORDER — ISOSORBIDE MONONITRATE ER 30 MG PO TB24: 30.0000 mg | ORAL_TABLET | Freq: Every day | ORAL | Status: DC

## 2014-10-11 NOTE — Clinical Social Work Note (Signed)
CSW received consult for housing issues for patient.  CSW went and spoke to patient she said she has a place, she is just waiting for it to be approved as section 8 housing.  Patient did not have any other needs, CSW to sign off.  Ervin Knack. Mckenzye Cutright, MSW, LCSWA [phone] 10/11/2014 11:24 AM

## 2014-10-11 NOTE — Progress Notes (Signed)
Patient Name: [name] Date of Encounter: 10/11/2014   SUBJECTIVE  Denies CP, SOB, HA, dizziness or orthopnea. No complains. She states she never had a workup for secondary hypertension. Yesterday afternoon her BP went up to 214/71. Given hydralazine 10mg  and additional norvasc 5 mg.  CURRENT MEDS . amLODipine  10 mg Oral Daily  . antiseptic oral rinse  7 mL Mouth Rinse BID  . aspirin EC  81 mg Oral Daily  . atorvastatin  80 mg Oral q1800  . carvedilol  6.25 mg Oral BID WC  . lisinopril  20 mg Oral Daily  . pantoprazole  40 mg Oral Daily  . pneumococcal 23 valent vaccine  0.5 mL Intramuscular Tomorrow-1000  . sodium chloride  3 mL Intravenous Q12H  . sodium chloride  3 mL Intravenous Q12H    OBJECTIVE  Filed Vitals:   10/10/14 1503 10/10/14 1530 10/10/14 1951 10/11/14 0332  BP: 180/58 167/92 186/59 181/75  Pulse:   84 74  Temp:   99 F (37.2 C) 98.6 F (37 C)  TempSrc:   Oral Oral  Resp:   18 18  Weight:      SpO2:   99% 94%    Intake/Output Summary (Last 24 hours) at 10/11/14 0803 Last data filed at 10/10/14 1804  Gross per 24 hour  Intake    240 ml  Output      0 ml  Net    240 ml   Filed Weights   10/07/14 0412  Weight: 256 lb 9.9 oz (116.4 kg)    PHYSICAL EXAM  General: Pleasant, obese women, laying comfortable in bed.  Neuro: Alert and oriented X 3. Moves all extremities spontaneously. Psych: Normal affect. HEENT:  Normal  Neck: Supple without bruits or JVD. Lungs:  Resp regular and unlabored. Limited breathing effort due to body habituate.  Heart: RRR. Systolic murmur.  Abdomen: Soft, non-tender, non-distended, BS + x 4.  Extremities: No clubbing, cyanosis or edema. DP/PT/Radials 2+ and equal bilaterally.  Accessory Clinical Findings  CBC  Recent Labs  10/09/14 0417 10/11/14 0320  WBC 10.3 9.3  HGB 13.5 14.1  HCT 42.7 43.8  MCV 85.1 84.2  PLT 116* 132*   Basic Metabolic Panel  Recent Labs  10/09/14 0417  NA 141  K 3.7    CL 110  CO2 22  GLUCOSE 96  BUN 14  CREATININE 0.75  CALCIUM 8.3*     Recent Labs  10/09/14 0417  TROPONINI 0.22*     TELE  Sinus Rhythm with HR 50 to 70. Occasional PVCs.   2D echo 10/07/2014 Study Conclusions  - Left ventricle: The cavity size was normal. Wall thickness was increased in a pattern of severe LVH. Systolic function was vigorous. The estimated ejection fraction was in the range of 65% to 70%. Wall motion was normal; there were no regional wall motion abnormalities. - Aortic valve: Valve area (VTI): 2.11 cm^2. Valve area (Vmax): 2.2 cm^2. - Left atrium: The atrium was mildly dilated. - Technically difficult study. Echocontrast is used to enhance visualization.  Cath 10/09/14 Conclusion     Angiographicall normal Coronary Arteries with moderate disease in an RPL branch - too distal for PCI  3rd RPLB lesion, 50% stenosed.  Hyperdynamic LV with near cavity obliteration & severely elevated LVEDP  Severe systemic HTN  Etiology for + Troponin is most likely related to Accelerated HTN. No Angiographically significant CAD. Hyperdynamic LV with Severe Systolic HTN & LVEDP of ~30-35 mmHg - but  near cavity obliteration   Plan: Standard post Radial Cath Care with TR Band removal. Aggressive BP management  Discharge planning per primary team - once BP controlled.     Coronary Findings    Dominance: Left   Left Anterior Descending  The vessel was , is large is angiographically normal.   . First Diagonal Branch   The vessel is moderate in size and is angiographically normal.   . Second Diagonal Branch   The vessel is large in size and is angiographically normal.   . Third Diagonal Branch   The vessel is angiographically normal.     Left Circumflex  The vessel was , is large . Terminates as small AV Groove branch - LPL1   . First Obtuse Marginal Branch   The vessel is large in size. Bifurcates into  2 moderate caliber OM branches   . Second Obtuse Marginal Branch   The vessel is large in size and is angiographically normal. Bifurcates into 2 OM branches - moderate caliber     Right Coronary Artery  The vessel was , is large is angiographically normal. The vessel is tortuous.   . Acute Marginal Branch   The vessel is moderate in size.   . Right Posterior Descending Artery   The vessel is moderate in size and is angiographically normal.   . Right Posterior Atrioventricular Branch   The vessel is moderate in size and exhibits minimal luminal irregularities.   . Third Right Posterolateral   . 3rd RPLB lesion, 50% stenosed. tubular .           ASSESSMENT AND PLAN Principal Problem:   ACS (acute coronary syndrome) Active Problems:   Morbid obesity   Coronary artery disease involving native coronary artery of native heart with angina pectoris   Accelerated hypertension    1. Chest pain - remote hx of CAD, reportedly possible PTCA 15 years ago. - initial trop neg, repeat 0.28->0.31->0.22. EKG inferior and lateral precordial ST depression, LVH with strain vs ischemia - unclear if troponin due to ACS or severe HTN on admission, SBP 220s. BP trending down after starting coreg and ACE-I - she is on atorva, asa, coreg, lisinopril, norvasc - echo 4/30 LVEF 65-70%, severe LVH - TSH normal, HgbA1C 5.8 - Cath showed angiographically normal Coronary Arteries with moderate disease in an RPL branch - too distal for PCI; 3rd RPLB lesion, 50% stenosed; hyperdynamic LV with near cavity obliteration & severely elevated LVEDP. -10/07/2014: Cholesterol, Total 154; HDL-C 46; LDL (calc) 86; Triglycerides 108; VLDL 22. Reduced Lipitor dose to  qd.   2. HTN - non-compliant with meds, presented with severe HTN SBP in 220s - severe LVH by echo -BP improving, however at time elevated. Borderline low HRs, will not increase coreg.  Yesterday BP went up to 214/71. Given  hydralazine  and additional norvasc 5 mg. BP now relatively stable. She is asymptomatic. Never had a workup for secondary HTN. Outpatient workup? Her home dose of Isordil  discontinued during this admission. Restart? - Will discharged on norvasc  qd, coreg 6.215mg  BID, lisinopril , lipitor , ASA .    3. Heart murmur  - due to hyperdynamic LV along with severe LVH (no significant obstructive gradient), no valvular pathology on echo - she is on beta blocker   4. Hypokalemia - on admission. Resolved.   5. Obesity Body mass index is 46.92 kg/(m^2). Need to loose weight.   Signed, Bhagat,Bhavinkumar PA-C  I have seen and examined the patient  along with Bhagat,Bhavinkumar PA-C.  I have reviewed the chart, notes and new data. I agree with PA's note.  Multiple meds have recently been restarted, Will resume isosorbide, but would otherwise avoid further BP med titration for several more days, until steady state is achieved.  PLAN: Will need assistance with meds and finding a new PCP to avoid readmission.  Thurmon Fair, MD, [facility] [phone] 10/11/2014, 8:51 AM

## 2014-10-11 NOTE — Progress Notes (Signed)
Pt D/C home per MD order, D/C instructions reviewed with pt and her daughter, IV removed by pt prior to review of d/c instructions. All   prescriptions given to pt, all her belongings sent home with her accompanied by her daughter, pt and daughter voiced understanding of d/c instructions & follow up appt. Pt transfer out with a wheelchair.

## 2014-10-11 NOTE — Discharge Instructions (Signed)
Witting Rx of amlodipine, atorvastatin, carvedilol, Imdur and lisinopril was given.   Start amlodipine, lisinopril, Imdur and aspirin tomorrow. Take Lipitor tonight. Take 2nd dose of carvedilol evening today.   She will need to find new PCP for medical management of her multiple problems including medication management and to avoid readmission.

## 2014-10-17 MED FILL — Heparin Sodium (Porcine) 2 Unit/ML in Sodium Chloride 0.9%: INTRAMUSCULAR | Qty: 500 | Status: AC

## 2014-10-17 MED FILL — Lidocaine HCl Local Preservative Free (PF) Inj 1%: INTRAMUSCULAR | Qty: 30 | Status: AC

## 2014-10-17 MED FILL — Heparin Sodium (Porcine) 2 Unit/ML in Sodium Chloride 0.9%: INTRAMUSCULAR | Qty: 1000 | Status: AC

## 2014-11-08 ENCOUNTER — Encounter: Payer: Medicaid Other | Admitting: Physician Assistant

## 2014-11-08 DIAGNOSIS — E785 Hyperlipidemia, unspecified: Secondary | ICD-10-CM | POA: Insufficient documentation

## 2014-11-08 DIAGNOSIS — I119 Hypertensive heart disease without heart failure: Secondary | ICD-10-CM | POA: Insufficient documentation

## 2014-11-08 NOTE — Progress Notes (Signed)
Cardiology Office Note   Date:  11/08/2014   ID:  [name], DOB [birth year], MRN [id number]  PCP:  Erlinda Hong, MD  Cardiologist:  Dr. Charlton Haws     Chief Complaint  Patient presents with  . Coronary Artery Disease  . Hospitalization Follow-up    Hypertension     History of Present Illness: [name] is a 59 y.o. female with a reported hx of CAD with possible angioplasty remotely, HTN, prior stroke, asthma, tobacco abuse, DJD. She is fairly sedentary and essentially wheelchair-bound secondary to knee issues. She spends most of her time watching TV and smoking cigarettes. Previously seen by Dr. Eden Emms many years ago. Admitted to [facility] 4/29-5/4 with chest pain and elevated troponins in the setting of hypertensive urgency. Patient had been off medications for about 3 months prior to presentation. Patient was noted to have a murmur on exam. Echocardiogram demonstrated severe LVH with hyperdynamic LV function and no significant gradient. She underwent cardiac catheterization which demonstrated no significant CAD aside from a 50% third RPL branch lesion. Medical therapy was recommended. Etiology of elevated troponin was felt to be related to accelerated hypertension. Development of rebound hypertension (214/71). She returns for follow-up.   Studies/Reports Reviewed Today:  LHC 10/09/14  Angiographicall normal Coronary Arteries with moderate disease in an RPL branch - too distal for PCI  3rd RPLB lesion, 50% stenosed.  Hyperdynamic LV with near cavity obliteration & severely elevated LVEDP (EF 65%)  Severe systemic HTN Etiology for + Troponin is most likely related to Accelerated HTN. No Angiographically significant CAD. Hyperdynamic LV with Severe Systolic HTN & LVEDP of ~30-35 mmHg - but near cavity obliteration   Echo 10/07/14 Severe LVH, EF 65-70%, vigorous LVF, normal wall motion AV gradient mean 5 mmHg, peak 9 mmHg Mild LAE  Past Medical  History  Diagnosis Date  . Hypertension   . Arthritis   . Stroke   . Coronary artery disease     a. remote hx of cath ~2000. B. cath  moderate disease in an RPL branch - too distal for PCI; 3rd RPLB lesion, 50% stenosed; hyperdynamic LV with near cavity obliteration & severely elevated LVEDP  . Asthma   . 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 Non-ischemic cardiomyopathy     cxr - mild cardiomegly, Echo 10/07/14 - severe LVH with LVEF 65-70%.    Past Surgical History  Procedure Laterality Date  . Abdominal hysterectomy    . Coronary stent placement    . I&d extremity  08/18/2011    Procedure: IRRIGATION AND DEBRIDEMENT EXTREMITY;  Surgeon: Sharma Covert, MD;  Location: [facility] OR;  Service: Orthopedics;  Laterality: Right;  I&D Right Long Finger  . Amputation  08/21/2011    Procedure: AMPUTATION DIGIT;  Surgeon: Sharma Covert, MD;  Location: [facility] OR;  Service: Orthopedics;  Laterality: Right;  . Flexible sigmoidoscopy  04/28/2012    Procedure: FLEXIBLE SIGMOIDOSCOPY;  Surgeon: Petra Kuba, MD;  Location: [facility] ENDOSCOPY;  Service: Endoscopy;  Laterality: N/A;  unsedated, unprepped  . Cardiac catheterization N/A 10/09/2014    Procedure: Left Heart Cath and Coronary Angiography;  Surgeon: Marykay Lex, MD;  Location: [facility] INVASIVE CV LAB CUPID;  Service: Cardiovascular;  Laterality: N/A;     Current Outpatient Prescriptions  Medication Sig Dispense Refill  . amLODipine (NORVASC) 10 MG tablet Take 1 tablet (10 mg total) by mouth daily. 30 tablet  6  . aspirin EC 81 MG EC tablet Take 1 tablet (81 mg total) by mouth daily.    Marland Kitchen atorvastatin (LIPITOR) 20 MG tablet Take 1 tablet (20 mg total) by mouth every morning. 30 tablet 6  . carvedilol (COREG) 6.25 MG tablet Take 1 tablet (6.25 mg total) by mouth 2 (two) times daily with a meal. 60 tablet 6  . cyclobenzaprine (FLEXERIL) 10 MG tablet Take 10 mg by mouth 3 (three) times daily as needed.  For muscle spasms    . docusate sodium (COLACE) 100 MG capsule Take 100 mg by mouth 2 (two) times daily.    Marland Kitchen doxazosin (CARDURA) 4 MG tablet Take 4 mg by mouth at bedtime.    Marland Kitchen HYDROcodone-acetaminophen (NORCO) 10-325 MG per tablet Take 1 tablet by mouth every 6 (six) hours as needed.    . isosorbide mononitrate (IMDUR) 30 MG 24 hr tablet Take 1 tablet (30 mg total) by mouth daily. 30 tablet 6  . lisinopril (PRINIVIL,ZESTRIL) 20 MG tablet Take 1 tablet (20 mg total) by mouth daily. 30 tablet 6  . lubiprostone (AMITIZA) 24 MCG capsule Take 24 mcg by mouth 2 (two) times daily with a meal.    . phenylephrine (,USE FOR PREPARATION-H,) 0.25 % suppository Place 1 suppository rectally 2 (two) times daily. (Patient not taking: Reported on 10/07/2014) 20 suppository 1  . traMADol (ULTRAM) 50 MG tablet Take 50 mg by mouth every 6 (six) hours as needed.    . traZODone (DESYREL) 100 MG tablet Take 100 mg by mouth at bedtime.     No current facility-administered medications for this visit.    Allergies:   Penicillins    Social History:  The patient  reports that she has been smoking Cigarettes.  She has a 18 pack-year smoking history. She has never used smokeless tobacco. She reports that she drinks about 3.6 oz of alcohol per week. She reports that she does not use illicit drugs.   Family History:  The patient's family history is not on file.    ROS:   Please see the history of present illness.   ROS    PHYSICAL EXAM: VS:  There were no vitals taken for this visit.    Wt Readings from Last 3 Encounters:  10/07/14 256 lb 9.9 oz (116.4 kg)  04/27/12 273 lb (123.832 kg)  08/18/11 273 lb 4.8 oz (123.968 kg)     GEN: Well nourished, well developed, in no acute distress HEENT: normal Neck: no JVD, no carotid bruits, no masses Cardiac:  Normal S1/S2, RRR; no murmur ,  no rubs or gallops, no edema  Respiratory:  clear to auscultation bilaterally, no wheezing, rhonchi or rales. GI: soft,  nontender, nondistended, + BS MS: no deformity or atrophy Skin: warm and dry  Neuro:  CNs II-XII intact, Strength and sensation are intact Psych: Normal affect   EKG:  EKG is ordered today.  It demonstrates:      Recent Labs: 10/09/2014: BUN 14; Creatinine 0.75; Potassium 3.7; Sodium 141 10/11/2014: Hemoglobin 14.1; Platelets 132*    Lipid Panel    Component Value Date/Time   CHOL 154 10/07/2014 0442   TRIG 108 10/07/2014 0442   HDL 46 10/07/2014 0442   CHOLHDL 3.3 10/07/2014 0442   VLDL 22 10/07/2014 0442   LDLCALC 86 10/07/2014 0442      ASSESSMENT AND PLAN:  Coronary artery disease involving native coronary artery of native heart without angina pectoris  Hypertensive heart disease without heart failure  Stroke  TOBACCO ABUSE  Morbid obesity  Chronic obstructive pulmonary disease, unspecified COPD, unspecified chronic bronchitis type  HLD (hyperlipidemia)    Current medicines are reviewed at length with the patient today.  Concerns regarding medicines are as outlined above.  The following changes have been made:       Labs/ tests ordered today include:  No orders of the defined types were placed in this encounter.    Disposition:   FU with    Signed, Tereso Newcomer, PA-C, MHS 11/08/2014 12:19 PM    [facility] [address] [place], [place], [place]  [address] Phone: [phone]; Fax: [phone]    This encounter was created in error - please disregard.

## 2014-12-05 ENCOUNTER — Encounter (HOSPITAL_COMMUNITY): Payer: Self-pay | Admitting: Emergency Medicine

## 2014-12-05 ENCOUNTER — Emergency Department (HOSPITAL_COMMUNITY)
Admission: EM | Admit: 2014-12-05 | Discharge: 2014-12-05 | Disposition: A | Discharge status: Home / Self Care | Payer: Medicaid Other | Attending: Emergency Medicine | Admitting: Emergency Medicine

## 2014-12-05 ENCOUNTER — Emergency Department (HOSPITAL_COMMUNITY): Payer: Medicaid Other

## 2014-12-05 DIAGNOSIS — Y92009 Unspecified place in unspecified non-institutional (private) residence as the place of occurrence of the external cause: Secondary | ICD-10-CM | POA: Diagnosis not present

## 2014-12-05 DIAGNOSIS — Z79899 Other long term (current) drug therapy: Secondary | ICD-10-CM | POA: Insufficient documentation

## 2014-12-05 DIAGNOSIS — I10 Essential (primary) hypertension: Secondary | ICD-10-CM | POA: Insufficient documentation

## 2014-12-05 DIAGNOSIS — W1839XA Other fall on same level, initial encounter: Secondary | ICD-10-CM | POA: Diagnosis not present

## 2014-12-05 DIAGNOSIS — S01511A Laceration without foreign body of lip, initial encounter: Secondary | ICD-10-CM | POA: Diagnosis not present

## 2014-12-05 DIAGNOSIS — Z23 Encounter for immunization: Secondary | ICD-10-CM | POA: Diagnosis not present

## 2014-12-05 DIAGNOSIS — Y939 Activity, unspecified: Secondary | ICD-10-CM | POA: Diagnosis not present

## 2014-12-05 DIAGNOSIS — Z720 Tobacco use: Secondary | ICD-10-CM | POA: Insufficient documentation

## 2014-12-05 DIAGNOSIS — Y999 Unspecified external cause status: Secondary | ICD-10-CM | POA: Diagnosis not present

## 2014-12-05 DIAGNOSIS — W19XXXA Unspecified fall, initial encounter: Secondary | ICD-10-CM

## 2014-12-05 DIAGNOSIS — S0993XA Unspecified injury of face, initial encounter: Secondary | ICD-10-CM | POA: Diagnosis present

## 2014-12-05 HISTORY — DX: Acute myocardial infarction, unspecified: I21.9

## 2014-12-05 MED ORDER — CARVEDILOL 6.25 MG PO TABS
6.2500 mg | ORAL_TABLET | Freq: Two times a day (BID) | ORAL | Status: DC
  Administered 2014-12-05: 6.25 mg via ORAL
  Filled 2014-12-05 (×2): qty 1

## 2014-12-05 MED ORDER — TETANUS-DIPHTH-ACELL PERTUSSIS 5-2.5-18.5 LF-MCG/0.5 IM SUSP
0.5000 mL | Freq: Once | INTRAMUSCULAR | Status: AC
Start: 2014-12-05 — End: 2014-12-05
  Administered 2014-12-05: 0.5 mL via INTRAMUSCULAR
  Filled 2014-12-05: qty 0.5

## 2014-12-05 MED ORDER — HYDROCODONE-ACETAMINOPHEN 5-325 MG PO TABS: ORAL_TABLET | ORAL | Status: DC

## 2014-12-05 MED ORDER — LISINOPRIL 20 MG PO TABS
20.0000 mg | ORAL_TABLET | Freq: Every day | ORAL | Status: DC
  Administered 2014-12-05: 20 mg via ORAL
  Filled 2014-12-05: qty 1

## 2014-12-05 MED ORDER — AMLODIPINE BESYLATE 10 MG PO TABS
10.0000 mg | ORAL_TABLET | Freq: Every day | ORAL | Status: DC
  Administered 2014-12-05: 10 mg via ORAL
  Filled 2014-12-05: qty 1

## 2014-12-05 MED ORDER — BUPIVACAINE HCL (PF) 0.5 % IJ SOLN
20.0000 mL | Freq: Once | INTRAMUSCULAR | Status: AC
  Administered 2014-12-05: 20 mL
  Filled 2014-12-05: qty 30

## 2014-12-05 MED ORDER — HYDROCODONE-ACETAMINOPHEN 5-325 MG PO TABS
1.0000 | ORAL_TABLET | Freq: Once | ORAL | Status: AC
  Administered 2014-12-05: 1 via ORAL
  Filled 2014-12-05: qty 1

## 2014-12-05 MED ORDER — OXYCODONE-ACETAMINOPHEN 5-325 MG PO TABS
1.0000 | ORAL_TABLET | Freq: Once | ORAL | Status: AC
  Administered 2014-12-05: 1 via ORAL
  Filled 2014-12-05: qty 1

## 2014-12-05 MED ORDER — ISOSORBIDE MONONITRATE ER 30 MG PO TB24
30.0000 mg | ORAL_TABLET | Freq: Every day | ORAL | Status: DC
  Administered 2014-12-05: 30 mg via ORAL
  Filled 2014-12-05: qty 1

## 2014-12-05 NOTE — Discharge Instructions (Signed)
Keep wound dry and do not remove dressing for 24 hours if possible. After that, wash gently morning and night (every 12 hours) with soap and water. Use a topical antibiotic ointment and cover with a bandaid or gauze.    Do NOT use rubbing alcohol or hydrogen peroxide, do not soak the area   Present to your primary care doctor or the urgent care of your choice, or the ED for suture removal in 7-10 days.   Every attempt was made to remove foreign body (contaminants) from the wound.  However, there is always a chance that some may remain in the wound. This can  increase your risk of infection.   If you see signs of infection (warmth, redness, tenderness, pus, sharp increase in pain, fever, red streaking in the skin) immediately return to the emergency department.   After the wound heals fully, apply sunscreen for 6-12 months to minimize scarring.   Please follow with your primary care doctor in the next 2 days for a check-up. They must obtain records for further management.   Do not hesitate to return to the Emergency Department for any new, worsening or concerning symptoms.    Facial Laceration A facial laceration is a cut on the face. These injuries can be painful and cause bleeding. Some cuts may need to be closed with stitches (sutures), skin adhesive strips, or wound glue. Cuts usually heal quickly but can leave a scar. It can take 1-2 years for the scar to go away completely. HOME CARE   Only take medicines as told by your doctor.  Follow your doctor's instructions for wound care. For Stitches:  Keep the cut clean and dry.  If you have a bandage (dressing), change it at least once a day. Change the bandage if it gets wet or dirty, or as told by your doctor.  Wash the cut with soap and water 2 times a day. Rinse the cut with water. Pat it dry with a clean towel.  Put a thin layer of medicated cream on the cut as told by your doctor.  You may shower after the first 24 hours. Do not  soak the cut in water until the stitches are removed.  Have your stitches removed as told by your doctor.  Do not wear any makeup until a few days after your stitches are removed. For Skin Adhesive Strips:  Keep the cut clean and dry.  Do not get the strips wet. You may take a bath, but be careful to keep the cut dry.  If the cut gets wet, pat it dry with a clean towel.  The strips will fall off on their own. Do not remove the strips that are still stuck to the cut. For Wound Glue:  You may shower or take baths. Do not soak or scrub the cut. Do not swim. Avoid heavy sweating until the glue falls off on its own. After a shower or bath, pat the cut dry with a clean towel.  Do not put medicine or makeup on your cut until the glue falls off.  If you have a bandage, do not put tape over the glue.  Avoid lots of sunlight or tanning lamps until the glue falls off.  The glue will fall off on its own in 5-10 days. Do not pick at the glue. After Healing: Put sunscreen on the cut for the first year to reduce your scar. GET HELP RIGHT AWAY IF:   Your cut area gets red, painful,  or puffy (swollen).  You see a yellowish-white fluid (pus) coming from the cut.  You have chills or a fever. MAKE SURE YOU:   Understand these instructions.  Will watch your condition.  Will get help right away if you are not doing well or get worse. Document Released: 11/12/2007 Document Revised: 03/16/2013 Document Reviewed: 01/06/2013 [facility] Patient Information 2015 [place], [place]. This information is not intended to replace advice given to you by your health care provider. Make sure you discuss any questions you have with your health care provider.

## 2014-12-05 NOTE — ED Provider Notes (Addendum)
CSN: [id number]     Arrival date & time 12/05/14  1441 History   First MD Initiated Contact with Patient 12/05/14 1516     Chief Complaint  Patient presents with  . Fall     (Consider location/radiation/quality/duration/timing/severity/associated sxs/prior Treatment) HPI   Blood pressure 220/94, pulse 64, temperature 98.8 F (37.1 C), temperature source Oral, resp. rate 22, SpO2 96 %.  [name] is a 59 y.o. female h/o HTN, cardiac stent, and lumbar disc herniation, who presents to the ED following a fall onto her left side that resulted in a laceration of her upper lip involving the vermillion border.  She normally gets around in a motorized scooter, but today around 1pm when she was up walking nearby her scooter, she turned and fell over a wheelchair ramp onto her left side, and hit her face on the ramp.  She reports no LOC and was down for about 30 min, until her daughter arrived to help her up.  She reports no feelings of chest pain, SOB, palpitations, dizziness, ataxia, difficulty talking, or swallowing today.  She reports not taking her blood pressure medications today, and is unsure of what she takes, but has her meds at home, she is not taking any blood thinners.     Past Medical History  Diagnosis Date  . Hypertension   . Heart attack    Past Surgical History  Procedure Laterality Date  . Coronary stent placement     No family history on file. History  Substance Use Topics  . Smoking status: Current Every Day Smoker  . Smokeless tobacco: Not on file  . Alcohol Use: Yes   OB History    No data available     Review of Systems  10 systems reviewed and found to be negative, except as noted in the HPI.  Allergies  Penicillins  Home Medications   Prior to Admission medications   Medication Sig Start Date End Date Taking? Authorizing Provider  amLODipine (NORVASC) 10 MG tablet Take 10 mg by mouth daily.   Yes Historical Provider, MD  atorvastatin (LIPITOR) 20  MG tablet Take 20 mg by mouth daily at 6 PM.   Yes Historical Provider, MD  carvedilol (COREG) 6.25 MG tablet Take 6.25 mg by mouth 2 (two) times daily with a meal.   Yes Historical Provider, MD  isosorbide mononitrate (IMDUR) 30 MG 24 hr tablet Take 30 mg by mouth daily.   Yes Historical Provider, MD  lisinopril (PRINIVIL,ZESTRIL) 20 MG tablet Take 20 mg by mouth daily.   Yes Historical Provider, MD  traZODone (DESYREL) 100 MG tablet Take 100 mg by mouth at bedtime as needed for sleep.   Yes Historical Provider, MD  HYDROcodone-acetaminophen (NORCO/VICODIN) 5-325 MG per tablet Take 1-2 tablets by mouth every 6 hours as needed for pain and/or cough. 12/05/14   Bernabe Dorce, PA-C   BP 184/102 mmHg  Pulse 77  Temp(Src) 98.8 F (37.1 C) (Oral)  Resp 18  SpO2 99% Physical Exam  Constitutional: She is oriented to person, place, and time. She appears well-developed and well-nourished. No distress.  HENT:  Head: Normocephalic.  Mouth/Throat: Oropharynx is clear and moist.  Largely edentulous, there is no broken or loose teeth, no blood in the mouth.  Extraocular movements are intact without pain or diplopia, no tenderness to palpation along the orbital rims bilaterally. No tenderness palpation over nasal bridge. Nasal septum is midline with no hematomas, no blood in the nares  1.25 cm full-thickness laceration  that crosses the vermilion border on the left upper lip. This is not a through and through. There is no gross contamination. Bleeding is controlled.  Eyes: Conjunctivae and EOM are normal. Pupils are equal, round, and reactive to light.  Neck:  No midline C-spine  tenderness to palpation or step-offs appreciated. Patient has full range of motion without pain.   Cardiovascular: Normal rate, regular rhythm and intact distal pulses.   Pulmonary/Chest: Effort normal and breath sounds normal. No stridor.  Abdominal: Soft. Bowel sounds are normal.  Musculoskeletal: Normal range of motion.  She exhibits tenderness.  No deformity, patient has bilateral knee tenderness palpation. No hip tenderness to palpation.  Neurological: She is alert and oriented to person, place, and time.  Follows commands, Clear, goal oriented speech, Strength is 5 out of 5x4 extremitiest. Sensation is grossly intact.   Skin:  Partial-thickness abrasions to left shoulder  Psychiatric: She has a normal mood and affect.  Nursing note and vitals reviewed.   ED Course  LACERATION REPAIR Date/Time: 12/05/2014 7:01 PM Performed by: Wynetta Emery Authorized by: Wynetta Emery Consent: Verbal consent obtained. Consent given by: patient Patient identity confirmed: verbally with patient Body area: head/neck Location details: upper lip Full thickness lip laceration: yes [place] border involved: yes Lip laceration height: up to half vertical height Laceration length: 1.3 cm Anesthesia: nerve block  Single layer wound closure  Labs Review Labs Reviewed - No data to display  Imaging Review Dg Chest 2 View  12/05/2014   CLINICAL DATA:  Larey Seat today with chest pain.  EXAM: CHEST  2 VIEW  COMPARISON:  None.  FINDINGS: The heart is enlarged. There is mild tortuosity and calcification of the thoracic aorta. The pulmonary hila appear normal. No acute pulmonary findings or pleural effusion. No pneumothorax. The bony thorax is grossly intact.  IMPRESSION: Cardiac enlargement but no acute pulmonary findings.   Electronically Signed   By: Rudie Meyer M.D.   On: 12/05/2014 18:02   Dg Shoulder Left  12/05/2014   CLINICAL DATA:  Fall from chair, now with left shoulder pain.  EXAM: LEFT SHOULDER - 2+ VIEW  COMPARISON:  None.  FINDINGS: No acute fracture or dislocation. Moderate glenohumeral osteoarthritis. Acromioclavicular joint is congruent. Questionable soft tissue calcifications in the region of the subacromial space.  IMPRESSION: 1. No acute fracture or dislocation of the left shoulder. 2. Moderate  osteoarthritis. Questionable soft tissue calcifications in the region of the subacromial space, may be intra-articular or related to rotator cuff pathology.   Electronically Signed   By: Rubye Oaks M.D.   On: 12/05/2014 18:03   Dg Knee Complete 4 Views Left  12/05/2014   CLINICAL DATA:  Pain following fall  EXAM: LEFT KNEE - COMPLETE 4+ VIEW  COMPARISON:  None.  FINDINGS: Frontal, lateral, and bilateral oblique views were obtained. There is no appreciable fracture or dislocation. No joint effusion. There is marked patellofemoral joint space narrowing. There is also fairly marked narrowing medially with moderate narrowing laterally. There is spurring in all compartments. No erosive change. There is extensive arterial vascular calcification.  IMPRESSION: Extensive osteoarthritic change. No fracture or effusion. Atherosclerotic calcification noted.   Electronically Signed   By: Bretta Bang III M.D.   On: 12/05/2014 18:02   Dg Knee Complete 4 Views Right  12/05/2014   CLINICAL DATA:  59 year old female with a history of fall. Bilateral knee pain.  EXAM: RIGHT KNEE - COMPLETE 4+ VIEW  COMPARISON:  None.  FINDINGS: No acute fracture line  identified.  Advanced osteoarthritis with tricompartmental osteoarthritis. Medial greater than lateral joint space narrowing with marginal osteophyte formation. Osteophyte formation at the patellofemoral joint.  Patella Alta.  Vascular calcifications.  IMPRESSION: No radiographic evidence of acute fracture.  Advanced changes of tricompartmental osteoarthritis.  Vascular calcifications of the tibial vessels and distal femoral popliteal vessels.  Signed,  Yvone Neu. Loreta Ave, DO  [facility]  [facility]   Electronically Signed   By: Gilmer Mor D.O.   On: 12/05/2014 18:04     EKG Interpretation None      MDM   Final diagnoses:  Fall  Lip laceration, initial encounter    Filed Vitals:   12/05/14 1454 12/05/14  1806 12/05/14 1806 12/05/14 1929  BP: 220/94 219/76 223/76 184/102  Pulse: 64 60  77  Temp: 98.8 F (37.1 C)     TempSrc: Oral     Resp: 22 18  18   SpO2: 96% 98%  99%    Medications  amLODipine (NORVASC) tablet 10 mg (10 mg Oral Given 12/05/14 1844)  carvedilol (COREG) tablet 6.25 mg (6.25 mg Oral Given 12/05/14 1843)  isosorbide mononitrate (IMDUR) 24 hr tablet 30 mg (30 mg Oral Given 12/05/14 1844)  lisinopril (PRINIVIL,ZESTRIL) tablet 20 mg (20 mg Oral Given 12/05/14 1843)  oxyCODONE-acetaminophen (PERCOCET/ROXICET) 5-325 MG per tablet 1 tablet (not administered)  HYDROcodone-acetaminophen (NORCO/VICODIN) 5-325 MG per tablet 1 tablet (1 tablet Oral Given 12/05/14 1529)  Tdap (BOOSTRIX) injection 0.5 mL (0.5 mLs Intramuscular Given 12/05/14 1531)  bupivacaine (MARCAINE) 0.5 % injection 20 mL (20 mLs Infiltration Given 12/05/14 1808)    [name] is a pleasant 59 y.o. female presenting with mechanical fall at home patient has a lip laceration and abrasions to the left shoulder. No signs of maxillofacial fracture. No signs of eye entrapment.  X-ray of shoulder, knee, chest are negative for acute changes. Lip laceration is closed. Neuro exam is nonfocal, no midline tenderness palpation, no indication for neurosurgical cervical spine imaging based on the [place] CT rules. His blood pressure is significantly elevated. She's been noncompliant with her hypertension medications. Home meds given and blood pressure responded nicely. Discussed with attending physician who agrees she is stable for discharge. I've had an extensive discussion of wound care with her family members.  This is a shared visit with the attending physician who personally evaluated the patient and agrees with the care plan.   Evaluation does not show pathology that would require ongoing emergent intervention or inpatient treatment. Pt is hemodynamically stable and mentating appropriately. Discussed findings and plan with  patient/guardian, who agrees with care plan. All questions answered. Return precautions discussed and outpatient follow up given.   New Prescriptions   HYDROCODONE-ACETAMINOPHEN (NORCO/VICODIN) 5-325 MG PER TABLET    Take 1-2 tablets by mouth every 6 hours as needed for pain and/or cough.         Wynetta Emery, PA-C 12/05/14 1953  Arby Barrette, MD 12/05/14 2332  Wynetta Emery, PA-C 12/20/14 1049  Arby Barrette, MD 12/26/14 0002

## 2014-12-05 NOTE — ED Notes (Signed)
Patient transported to X-ray 

## 2014-12-05 NOTE — ED Notes (Signed)
Per EMS: pt had fall, mechanical. Pt has lac to upper lip, abrasions to left side. No LOC

## 2014-12-05 NOTE — ED Notes (Signed)
Bed: [facility] Expected date:  Expected time:  Means of arrival:  Comments: EMS-fall 

## 2014-12-06 ENCOUNTER — Encounter (HOSPITAL_COMMUNITY): Payer: Self-pay | Admitting: Physician Assistant

## 2014-12-13 ENCOUNTER — Emergency Department (INDEPENDENT_AMBULATORY_CARE_PROVIDER_SITE_OTHER)
Admission: EM | Admit: 2014-12-13 | Discharge: 2014-12-13 | Disposition: A | Discharge status: Home / Self Care | Payer: Medicaid Other | Source: Home / Self Care | Attending: Family Medicine | Admitting: Family Medicine

## 2014-12-13 ENCOUNTER — Encounter (HOSPITAL_COMMUNITY): Payer: Self-pay | Admitting: Emergency Medicine

## 2014-12-13 DIAGNOSIS — Z4802 Encounter for removal of sutures: Secondary | ICD-10-CM | POA: Diagnosis not present

## 2014-12-13 NOTE — Discharge Instructions (Signed)
Take your medicine and see your doctor next week for bp check.

## 2014-12-13 NOTE — ED Provider Notes (Signed)
CSN: [id number]     Arrival date & time 12/13/14  1300 History   First MD Initiated Contact with Patient 12/13/14 1330     Chief Complaint  Patient presents with  . Suture / Staple Removal   (Consider location/radiation/quality/duration/timing/severity/associated sxs/prior Treatment) Patient is a 59 y.o. female presenting with suture removal. The history is provided by the patient.  Suture / Staple Removal This is a new problem. The current episode started more than 1 week ago (had left upper lip lac on 6/28, here for sr.). The problem has been rapidly improving. Pertinent negatives include no chest pain and no headaches.    Past Medical History  Diagnosis Date  . Arthritis   . Stroke   . Coronary artery disease     a. remote hx of cath ~2000. B. cath  moderate disease in an RPL branch - too distal for PCI; 3rd RPLB lesion, 50% stenosed; hyperdynamic LV with near cavity obliteration & severely elevated LVEDP  . Asthma   . 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 Non-ischemic cardiomyopathy     cxr - mild cardiomegly, Echo 10/07/14 - severe LVH with LVEF 65-70%.  . Hypertension   . Heart attack    Past Surgical History  Procedure Laterality Date  . Abdominal hysterectomy    . I&d extremity  08/18/2011    Procedure: IRRIGATION AND DEBRIDEMENT EXTREMITY;  Surgeon: Sharma Covert, MD;  Location: [facility] OR;  Service: Orthopedics;  Laterality: Right;  I&D Right Long Finger  . Amputation  08/21/2011    Procedure: AMPUTATION DIGIT;  Surgeon: Sharma Covert, MD;  Location: [facility] OR;  Service: Orthopedics;  Laterality: Right;  . Flexible sigmoidoscopy  04/28/2012    Procedure: FLEXIBLE SIGMOIDOSCOPY;  Surgeon: Petra Kuba, MD;  Location: [facility] ENDOSCOPY;  Service: Endoscopy;  Laterality: N/A;  unsedated, unprepped  . Cardiac catheterization N/A 10/09/2014    Procedure: Left Heart Cath and Coronary Angiography;  Surgeon: Marykay Lex, MD;   Location: [facility] INVASIVE CV LAB CUPID;  Service: Cardiovascular;  Laterality: N/A;  . Coronary stent placement     No family history on file. History  Substance Use Topics  . Smoking status: Current Every Day Smoker -- 0.50 packs/day for 36 years    Types: Cigarettes  . Smokeless tobacco: Not on file  . Alcohol Use: 3.6 oz/week    6 Cans of beer per week     Comment: 04/27/12- "amount varies"   OB History    Gravida Para Term Preterm AB TAB SAB Ectopic Multiple Living   0 0 0 0 0 0 0 0       Review of Systems  Constitutional: Negative.   Cardiovascular: Negative for chest pain.  Skin: Positive for wound.  Neurological: Negative for headaches.    Allergies  Penicillins and Penicillins  Home Medications   Prior to Admission medications   Medication Sig Start Date End Date Taking? Authorizing Provider  amLODipine (NORVASC) 10 MG tablet Take 1 tablet (10 mg total) by mouth daily. 10/10/14   Bhavinkumar Bhagat, PA  amLODipine (NORVASC) 10 MG tablet Take 10 mg by mouth daily.    Historical Provider, MD  aspirin EC 81 MG EC tablet Take 1 tablet (81 mg total) by mouth daily. 10/10/14   Bhavinkumar Bhagat, PA  atorvastatin (LIPITOR) 20 MG tablet Take 1 tablet (20 mg total) by mouth every morning. 10/10/14   Manson Passey, PA  atorvastatin (LIPITOR) 20 MG tablet Take 20 mg by mouth daily at 6 PM.    Historical Provider, MD  carvedilol (COREG) 6.25 MG tablet Take 1 tablet (6.25 mg total) by mouth 2 (two) times daily with a meal. 10/10/14   Bhavinkumar Bhagat, PA  carvedilol (COREG) 6.25 MG tablet Take 6.25 mg by mouth 2 (two) times daily with a meal.    Historical Provider, MD  cyclobenzaprine (FLEXERIL) 10 MG tablet Take 10 mg by mouth 3 (three) times daily as needed. For muscle spasms    Historical Provider, MD  docusate sodium (COLACE) 100 MG capsule Take 100 mg by mouth 2 (two) times daily.    Historical Provider, MD  doxazosin (CARDURA) 4 MG tablet Take 4 mg by mouth at bedtime.     Historical Provider, MD  HYDROcodone-acetaminophen (NORCO) 10-325 MG per tablet Take 1 tablet by mouth every 6 (six) hours as needed.    Historical Provider, MD  HYDROcodone-acetaminophen (NORCO/VICODIN) 5-325 MG per tablet Take 1-2 tablets by mouth every 6 hours as needed for pain and/or cough. 12/05/14   Nicole Pisciotta, PA-C  isosorbide mononitrate (IMDUR) 30 MG 24 hr tablet Take 1 tablet (30 mg total) by mouth daily. 10/11/14   Bhavinkumar Bhagat, PA  isosorbide mononitrate (IMDUR) 30 MG 24 hr tablet Take 30 mg by mouth daily.    Historical Provider, MD  lisinopril (PRINIVIL,ZESTRIL) 20 MG tablet Take 1 tablet (20 mg total) by mouth daily. 10/10/14   Bhavinkumar Bhagat, PA  lisinopril (PRINIVIL,ZESTRIL) 20 MG tablet Take 20 mg by mouth daily.    Historical Provider, MD  lubiprostone (AMITIZA) 24 MCG capsule Take 24 mcg by mouth 2 (two) times daily with a meal.    Historical Provider, MD  phenylephrine (,USE FOR PREPARATION-H,) 0.25 % suppository Place 1 suppository rectally 2 (two) times daily. Patient not taking: Reported on 10/07/2014 04/28/12   Osvaldo Shipper, MD  traMADol (ULTRAM) 50 MG tablet Take 50 mg by mouth every 6 (six) hours as needed.    Historical Provider, MD  traZODone (DESYREL) 100 MG tablet Take 100 mg by mouth at bedtime.    Historical Provider, MD  traZODone (DESYREL) 100 MG tablet Take 100 mg by mouth at bedtime as needed for sleep.    Historical Provider, MD   BP 224/93 mmHg  Pulse 62  Temp(Src) 98.2 F (36.8 C)  Resp 24  SpO2 98% Physical Exam  Constitutional: She is oriented to person, place, and time. She appears well-developed and well-nourished. No distress.  Neurological: She is alert and oriented to person, place, and time.  Skin: Skin is warm and dry. No erythema.  Lac to lip healed, sr without diff.  Nursing note and vitals reviewed.   ED Course  Procedures (including critical care time) Labs Review Labs Reviewed - No data to display  Imaging Review No  results found.   MDM   1. Visit for suture removal       Linna Hoff, MD 12/13/14 1349

## 2014-12-13 NOTE — ED Notes (Signed)
Grandson and daughter in treatment room on discharge.  Stressed the importance of seeing her physician and /or locating a physician to manage blood pressure.  Discussed with these two family members the importance of patient's medication and making sure patient is taking medicine correctly.  Shared with family members that patient says she forgets when to take what medicines.

## 2014-12-13 NOTE — ED Notes (Signed)
Patient in department for suture removal from upper lip. Stitches placed at [facility] on 12/05/2014

## 2014-12-17 NOTE — Progress Notes (Signed)
Cardiology Office Note   Date:  12/17/2014   ID:  [name], DOB 12-Sep-1955, MRN [id number]  PCP:  Erlinda Hong, MD  Cardiologist:    Electrophysiologist:    No chief complaint on file.    History of Present Illness: [name] is a 59 y.o. female with a hx of   Admitted 4/29-5/4  CAD by her report, cath 15 years ago and possibly had balloon angioplasty. Also has hx of HTN (noncompliant with medications), stroke, asthma and current tobacco smoker. Previously seen by Dr. Eden Emms many years ago. She presented to [facility] 4/30 with chest pain and malignant hypertension. She has been off of her HTN meds for at least 3 months. She was unable to provider more information on her heart attack. She also has had 2 strokes. She is wheelchair bound because of knee issues and spends most of her time watching TV and smoking. Her chest pain started while eating dinner and relieved by maalox. The pain was sharp and in the center of her chest and her abdomen. There was no associated syncope or diaphoresis. She denies any recent history of rest or exertional chest pain. Her ECG shows ST depression diffusely and her POC troponin was within normal. CXR showed mild cardiomegaly. She was admitted for management of HTN, murmur and regimen of her CAD.   Hospital Course  She was admitted to telemetery and lisinorpil and coreg was restarted. He was also given IV labetalol and hydralazine and her BP was improved. She was anticoagulated with heparin. Her potassium was 2.9 on admission was resolved with supplement. BP continues to trended down and coreg 6.25mg  BID was not increased due to borderline low HRs. Norvasc 5 mg started which was half the home dose pre cath. Echo showed severe LVH with LVEF 65-70%. Her heart murmur is likely due to hyperdynamic LV along with severe LVH (no significant obstructive gradient), no valvular pathology on echo. She remained symptoms free. Her trop trend was  0.26-->0.28-->0.31-->0.22. She was taken to cath lab due to unclear etiology of her elevated trop, either ACS or server HTN on admission. Cath showed angiographically normal Coronary Arteries with moderate disease in an RPL branch - too distal for PCI; 3rd RPLB lesion, 50% stenosed; hyperdynamic LV with near cavity obliteration & severely elevated LVEDP. Post cath pt declined PT therapy and refused to draws her labs. She was also remained noncompliant to nurses. She missed dose of coreg twice on cath day. Following day of her cath, she had a rebound hypertension of 214/71. She was given IV hydralazine 10mg  and extra dose of norvasc 5 mg once. Her BP was improved with additional medications. Prior to her discharge she was given amlodipine 10mg  , lisinopril 20mg , Imdur 30mg  and aspirin 81mg  and coreg 6.25mg  in morning. She advice to take PM does of coreg and lipitor 20mg . Her TSH normal, HgbA1C 5.8, Cholesterol, Total 154; HDL-C 46; LDL (calc) 86; Triglycerides 108; VLDL 22. She advised to find a new PCP for medical management of her multiple problems including medication management and to avoid readmission.   Studies/Reports Reviewed Today:  [facility] 10/09/14  Angiographicall normal Coronary Arteries with moderate disease in an RPL branch - too distal for PCI  3rd RPLB lesion, 50% stenosed.  Hyperdynamic LV with near cavity obliteration & severely elevated LVEDP  Severe systemic HTN  Etiology for + Troponin is most likely related to Accelerated HTN. No Angiographically significant CAD. Hyperdynamic LV with Severe Systolic HTN & LVEDP of ~30-35  mmHg - but near cavity obliteration  Plan:  Standard post Radial Cath Care with TR Band removal.  Aggressive BP management Discharge planning per primary team - once BP controlled.  Echo 10/07/14 Severe LVH, EF 65-70%, no RWMA Mild LAE  Past Medical History  Diagnosis Date  . Arthritis   . Stroke   . Coronary artery disease     a. remote hx of cath  ~2000. B. cath  moderate disease in an RPL branch - too distal for PCI; 3rd RPLB lesion, 50% stenosed; hyperdynamic LV with near cavity obliteration & severely elevated LVEDP  . Asthma   . 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 Non-ischemic cardiomyopathy     cxr - mild cardiomegly, Echo 10/07/14 - severe LVH with LVEF 65-70%.  . Hypertension   . Heart attack     Past Surgical History  Procedure Laterality Date  . Abdominal hysterectomy    . I&d extremity  08/18/2011    Procedure: IRRIGATION AND DEBRIDEMENT EXTREMITY;  Surgeon: Sharma Covert, MD;  Location: [facility] OR;  Service: Orthopedics;  Laterality: Right;  I&D Right Long Finger  . Amputation  08/21/2011    Procedure: AMPUTATION DIGIT;  Surgeon: Sharma Covert, MD;  Location: [facility] OR;  Service: Orthopedics;  Laterality: Right;  . Flexible sigmoidoscopy  04/28/2012    Procedure: FLEXIBLE SIGMOIDOSCOPY;  Surgeon: Petra Kuba, MD;  Location: [facility] ENDOSCOPY;  Service: Endoscopy;  Laterality: N/A;  unsedated, unprepped  . Cardiac catheterization N/A 10/09/2014    Procedure: Left Heart Cath and Coronary Angiography;  Surgeon: Marykay Lex, MD;  Location: [facility] INVASIVE CV LAB CUPID;  Service: Cardiovascular;  Laterality: N/A;  . Coronary stent placement       Current Outpatient Prescriptions  Medication Sig Dispense Refill  . amLODipine (NORVASC) 10 MG tablet Take 1 tablet (10 mg total) by mouth daily. 30 tablet 6  . amLODipine (NORVASC) 10 MG tablet Take 10 mg by mouth daily.    Marland Kitchen aspirin EC 81 MG EC tablet Take 1 tablet (81 mg total) by mouth daily.    Marland Kitchen atorvastatin (LIPITOR) 20 MG tablet Take 1 tablet (20 mg total) by mouth every morning. 30 tablet 6  . atorvastatin (LIPITOR) 20 MG tablet Take 20 mg by mouth daily at 6 PM.    . carvedilol (COREG) 6.25 MG tablet Take 1 tablet (6.25 mg total) by mouth 2 (two) times daily with a meal. 60 tablet 6  . carvedilol (COREG) 6.25 MG tablet Take  6.25 mg by mouth 2 (two) times daily with a meal.    . cyclobenzaprine (FLEXERIL) 10 MG tablet Take 10 mg by mouth 3 (three) times daily as needed. For muscle spasms    . docusate sodium (COLACE) 100 MG capsule Take 100 mg by mouth 2 (two) times daily.    Marland Kitchen doxazosin (CARDURA) 4 MG tablet Take 4 mg by mouth at bedtime.    Marland Kitchen HYDROcodone-acetaminophen (NORCO) 10-325 MG per tablet Take 1 tablet by mouth every 6 (six) hours as needed.    Marland Kitchen HYDROcodone-acetaminophen (NORCO/VICODIN) 5-325 MG per tablet Take 1-2 tablets by mouth every 6 hours as needed for pain and/or cough. 7 tablet 0  . isosorbide mononitrate (IMDUR) 30 MG 24 hr tablet Take 1 tablet (30 mg total) by mouth daily. 30 tablet 6  . isosorbide mononitrate (IMDUR) 30 MG 24 hr tablet Take 30 mg by mouth daily.    Marland Kitchen  lisinopril (PRINIVIL,ZESTRIL) 20 MG tablet Take 1 tablet (20 mg total) by mouth daily. 30 tablet 6  . lisinopril (PRINIVIL,ZESTRIL) 20 MG tablet Take 20 mg by mouth daily.    Marland Kitchen lubiprostone (AMITIZA) 24 MCG capsule Take 24 mcg by mouth 2 (two) times daily with a meal.    . phenylephrine (,USE FOR PREPARATION-H,) 0.25 % suppository Place 1 suppository rectally 2 (two) times daily. (Patient not taking: Reported on 10/07/2014) 20 suppository 1  . traMADol (ULTRAM) 50 MG tablet Take 50 mg by mouth every 6 (six) hours as needed.    . traZODone (DESYREL) 100 MG tablet Take 100 mg by mouth at bedtime.    . traZODone (DESYREL) 100 MG tablet Take 100 mg by mouth at bedtime as needed for sleep.     No current facility-administered medications for this visit.    Allergies:   Penicillins and Penicillins    Social History:  The patient  reports that she has been smoking Cigarettes.  She has a 18 pack-year smoking history. She does not have any smokeless tobacco history on file. She reports that she drinks about 3.6 oz of alcohol per week. She reports that she does not use illicit drugs.   Family History:  The patient's family history is not  on file.    ROS:   Please see the history of present illness.   ROS    PHYSICAL EXAM: VS:  There were no vitals taken for this visit.    Wt Readings from Last 3 Encounters:  10/07/14 256 lb 9.9 oz (116.4 kg)  04/27/12 273 lb (123.832 kg)  08/18/11 273 lb 4.8 oz (123.968 kg)     GEN: Well nourished, well developed, in no acute distress HEENT: normal Neck: no JVD, no carotid bruits, no masses Cardiac:  Normal S1/S2, RRR; no murmur ,  no rubs or gallops, no edema  Respiratory:  clear to auscultation bilaterally, no wheezing, rhonchi or rales. GI: soft, nontender, nondistended, + BS MS: no deformity or atrophy Skin: warm and dry  Neuro:  CNs II-XII intact, Strength and sensation are intact Psych: Normal affect   EKG:  EKG is ordered today.  It demonstrates:      Recent Labs: 10/09/2014: BUN 14; Creatinine, Ser 0.75; Potassium 3.7; Sodium 141 10/11/2014: Hemoglobin 14.1; Platelets 132*    Lipid Panel    Component Value Date/Time   CHOL 154 10/07/2014 0442   TRIG 108 10/07/2014 0442   HDL 46 10/07/2014 0442   CHOLHDL 3.3 10/07/2014 0442   VLDL 22 10/07/2014 0442   LDLCALC 86 10/07/2014 0442      ASSESSMENT AND PLAN:  No diagnosis found.     Medication Changes: Current medicines are reviewed at length with the patient today.  Concerns regarding medicines are as outlined above.  The following changes have been made:   Discontinued Medications   No medications on file   Modified Medications   No medications on file   New Prescriptions   No medications on file     Labs/ tests ordered today include:   No orders of the defined types were placed in this encounter.     Disposition:   FU with    Signed, Tereso Newcomer, PA-C, MHS 12/17/2014 10:10 PM    [facility] [address] [place], [place], [place]  [address] Phone: [phone]; Fax: [phone]    This encounter was created in error - please disregard.

## 2014-12-18 ENCOUNTER — Encounter: Payer: Medicaid Other | Admitting: Physician Assistant

## 2014-12-25 NOTE — Progress Notes (Signed)
Cardiology Office Note   Date:  12/26/2014   ID:  [name], DOB 1956-03-02, MRN [id number]  PCP:  Erlinda Hong, MD  Cardiologist:  Chart lists Dr. Charlton Haws for unclear reasons >> will have her FU with Dr. Thurmon Fair    Chief Complaint  Patient presents with  . Coronary Artery Disease     History of Present Illness: [name] is a 59 y.o. female with a hx of reported CAD. Patient noted cardiac catheterization in 2001 with balloon angioplasty. Other history includes HTN, prior stroke, asthma, tobacco abuse. She is wheelchair-bound because of knee issues.  Admitted 4/29-5/4with chest discomfort in the setting of uncontrolled hypertension. She had mildly elevated troponins with flat trend. Cardiac catheterization demonstrated 50% stenosis in a third branch of RPLB. This was far too distal to consider PCI. She had otherwise normal coronary arteries. Etiology of elevated troponin was felt to be related to accelerated hypertension. She was noted to have hyperdynamic LV with severe systolic hypertension and LVEDP 30-35. Patient was noted to have near cavity obliteration.  She returns for FU.  Here with her daughter. She is in a wheelchair.  Her PCP has closed his practice. She has been out of medications for 2 mos.  She needs her Albuterol inhaler.  She continues to smoke.  Albuterol usually helps.  She has chest tightness with her dyspnea.  She denies syncope, orthopnea, PND, LE edema.  She has a chronic cough. No hemoptysis.  No fevers.     Studies/Reports Reviewed Today:  LHC 10/09/14 LAD:  Normal LCx:  Normal RCA:  Normal; RPLB3 50% EF 65%  Angiographicall normal Coronary Arteries with moderate disease in an RPL branch - too distal for PCI  3rd RPLB lesion, 50% stenosed.  Hyperdynamic LV with near cavity obliteration & severely elevated LVEDP  Severe systemic HTN  Etiology for + Troponin is most likely related to Accelerated HTN. No Angiographically  significant CAD. Hyperdynamic LV with Severe Systolic HTN & LVEDP of ~30-35 mmHg - but near cavity obliteration  Plan: Standard post Radial Cath Care with TR Band removal. Aggressive BP management Discharge planning per primary team - once BP controlled.  Echo 10/07/14 - Severe LVH. Systolic function wasvigorous. EF 65%to 70%. Wall motion was normal  - Aortic valve: Valve area (VTI): 2.11 cm^2. Valve area (Vmax): 2.2cm^2. - Left atrium: The atrium was mildly dilated. - Technically difficult study. Echocontrast is used to [employer].   Past Medical History  Diagnosis Date  . Arthritis   . Stroke   . Coronary artery disease     a. remote hx of cath ~2000. B. cath  moderate disease in an RPL branch - too distal for PCI; 3rd RPLB lesion, 50% stenosed; hyperdynamic LV with near cavity obliteration & severely elevated LVEDP  . Asthma   . 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 Non-ischemic cardiomyopathy     cxr - mild cardiomegly, Echo 10/07/14 - severe LVH with LVEF 65-70%.  . Hypertension   . Heart attack     Past Surgical History  Procedure Laterality Date  . Abdominal hysterectomy    . I&d extremity  08/18/2011    Procedure: IRRIGATION AND DEBRIDEMENT EXTREMITY;  Surgeon: Sharma Covert, MD;  Location: [facility] OR;  Service: Orthopedics;  Laterality: Right;  I&D Right Long Finger  . Amputation  08/21/2011    Procedure: AMPUTATION DIGIT;  Surgeon: Sharma Covert, MD;  Location: [facility] OR;  Service: Orthopedics;  Laterality: Right;  . Flexible sigmoidoscopy  04/28/2012    Procedure: FLEXIBLE SIGMOIDOSCOPY;  Surgeon: Petra Kuba, MD;  Location: [facility] ENDOSCOPY;  Service: Endoscopy;  Laterality: N/A;  unsedated, unprepped  . Cardiac catheterization N/A 10/09/2014    Procedure: Left Heart Cath and Coronary Angiography;  Surgeon: Marykay Lex, MD;  Location: [facility] INVASIVE CV LAB CUPID;  Service: Cardiovascular;  Laterality: N/A;    . Coronary stent placement       Current Outpatient Prescriptions  Medication Sig Dispense Refill  . amLODipine (NORVASC) 5 MG tablet Take 1 tablet (5 mg total) by mouth daily. 30 tablet 5  . aspirin EC 81 MG EC tablet Take 1 tablet (81 mg total) by mouth daily.    Marland Kitchen atorvastatin (LIPITOR) 20 MG tablet Take 1 tablet (20 mg total) by mouth daily at 6 PM. 30 tablet 5  . albuterol (PROVENTIL HFA;VENTOLIN HFA) 108 (90 BASE) MCG/ACT inhaler 1 to 2 puffs every 4 to 6 hours as needed 1 Inhaler 5  . losartan (COZAAR) 50 MG tablet Take 1 tablet (50 mg total) by mouth daily. 30 tablet 5   No current facility-administered medications for this visit.    Allergies:   Penicillins and Penicillins    Social History:  The patient  reports that she has been smoking Cigarettes.  She has a 18 pack-year smoking history. She has quit using smokeless tobacco. Her smokeless tobacco use included Snuff. She reports that she drinks about 3.6 oz of alcohol per week. She reports that she does not use illicit drugs.   Family History:  The patient's family history includes Arthritis in her mother; Asthma in her sister; Bronchitis in her sister; Diabetes type I in her brother, mother, and sister; Diabetes type II in her brother; Healthy in her brother, brother, and brother; Heart disease in her brother; Hypertension in her brother, mother, and sister; Stroke in her sister.    ROS:   Please see the history of present illness.   Review of Systems  Constitution: Positive for diaphoresis and malaise/fatigue.  Cardiovascular: Positive for chest pain, dyspnea on exertion, irregular heartbeat, leg swelling, orthopnea and paroxysmal nocturnal dyspnea.  Respiratory: Positive for cough.   Musculoskeletal: Positive for back pain.  Neurological: Positive for loss of balance.  All other systems reviewed and are negative.    PHYSICAL EXAM: VS:  BP 160/90 mmHg  Pulse 73  Ht  (1.6 m)  Wt 251 lb 6.4 oz (114.034 kg)   BMI 44.54 kg/m2    Wt Readings from Last 3 Encounters:  12/26/14 251 lb 6.4 oz (114.034 kg)  10/07/14 256 lb 9.9 oz (116.4 kg)  04/27/12 273 lb (123.832 kg)     GEN: Well nourished, well developed, in no acute distress HEENT: normal Neck: no JVD at 90 degrees,  no masses Cardiac:  Normal S1/S2, RRR; no murmur, no rubs or gallops, trace bilateral LE edema   Respiratory:  Decreased breath sounds bilaterally, expiratory rhonchi, no rales, no wheezing  GI: soft, nontender, nondistended, + BS MS: no deformity or atrophy Skin: warm and dry  Neuro:  CNs II-XII intact, Strength and sensation are intact Psych: Normal affect   EKG:  EKG is ordered today.  It demonstrates:   NSR, HR 73, normal axis, T-wave inversions in 1, 2, aVL, aVF, V5-V6, similar to prior tracings   Recent Labs: 10/09/2014: BUN 14; Creatinine, Ser 0.75; Potassium 3.7; Sodium 141 10/11/2014: Hemoglobin 14.1; Platelets 132*  Lipid Panel    Component Value Date/Time   CHOL 154 10/07/2014 0442   TRIG 108 10/07/2014 0442   HDL 46 10/07/2014 0442   CHOLHDL 3.3 10/07/2014 0442   VLDL 22 10/07/2014 0442   LDLCALC 86 10/07/2014 0442      ASSESSMENT AND PLAN:  Coronary Artery Disease:  She complains of dyspnea and chest tightness that mainly seems to be related to COPD.  She had normal coronary arteries aside for a distal RPLB branch with 50% stenosis.  She has been out of all of her medications for 2 mos.  I will refill Amlodipine 5 QD, ASA 81 QD, Lipitor 20 QD.  She has Medicaid.  She did ask me to lend her the money to pay for her medications. She cannot afford the $3 for her prescriptions.  I did not lend her money as our policy does not allow this.  Hypertensive Heart Disease:  BP uncontrolled.  She is out of all medications. Given her COPD/asthma, I do not think that Carvedilol is the best medication for her.  Start Amlodipine 5 QD and Losartan 50 QD.  Check BMET today and BMET in 1-2 weeks.  Could consider Bystolic at  FU to avoid exacerbating bronchospasm.    Hyperlipidemia:  Resume Lipitor 20 QD.   Tobacco Abuse:  She likely has COPD.  I will refill Albuterol prn.  Refer to Pulmonology.    Prior CVA:  Continue ASA, statin.  Needs strict BP control.      Medication Changes: Current medicines are reviewed at length with the patient today.  Concerns regarding medicines are as outlined above.  The following changes have been made:   Discontinued Medications   AMLODIPINE (NORVASC) 10 MG TABLET    Take 10 mg by mouth daily.   ATORVASTATIN (LIPITOR) 20 MG TABLET    Take 1 tablet (20 mg total) by mouth every morning.   ATORVASTATIN (LIPITOR) 20 MG TABLET    Take 20 mg by mouth daily at 6 PM.   CARVEDILOL (COREG) 6.25 MG TABLET    Take 1 tablet (6.25 mg total) by mouth 2 (two) times daily with a meal.   CARVEDILOL (COREG) 6.25 MG TABLET    Take 6.25 mg by mouth 2 (two) times daily with a meal.   CYCLOBENZAPRINE (FLEXERIL) 10 MG TABLET    Take 10 mg by mouth 3 (three) times daily as needed. For muscle spasms   DOCUSATE SODIUM (COLACE) 100 MG CAPSULE    Take 100 mg by mouth 2 (two) times daily.   DOXAZOSIN (CARDURA) 4 MG TABLET    Take 4 mg by mouth at bedtime.   HYDROCODONE-ACETAMINOPHEN (NORCO) 10-325 MG PER TABLET    Take 1 tablet by mouth every 6 (six) hours as needed.   HYDROCODONE-ACETAMINOPHEN (NORCO/VICODIN) 5-325 MG PER TABLET    Take 1-2 tablets by mouth every 6 hours as needed for pain and/or cough.   ISOSORBIDE MONONITRATE (IMDUR) 30 MG 24 HR TABLET    Take 1 tablet (30 mg total) by mouth daily.   ISOSORBIDE MONONITRATE (IMDUR) 30 MG 24 HR TABLET    Take 30 mg by mouth daily.   LISINOPRIL (PRINIVIL,ZESTRIL) 20 MG TABLET    Take 1 tablet (20 mg total) by mouth daily.   LISINOPRIL (PRINIVIL,ZESTRIL) 20 MG TABLET    Take 20 mg by mouth daily.   LUBIPROSTONE (AMITIZA) 24 MCG CAPSULE    Take 24 mcg by mouth 2 (two) times daily with a meal.  PHENYLEPHRINE (,USE FOR PREPARATION-H,) 0.25 % SUPPOSITORY     Place 1 suppository rectally 2 (two) times daily.   TRAMADOL (ULTRAM) 50 MG TABLET    Take 50 mg by mouth every 6 (six) hours as needed for moderate pain.    TRAZODONE (DESYREL) 100 MG TABLET    Take 100 mg by mouth at bedtime.   TRAZODONE (DESYREL) 100 MG TABLET    Take 100 mg by mouth at bedtime as needed for sleep.   Modified Medications   Modified Medication Previous Medication   AMLODIPINE (NORVASC) 5 MG TABLET amLODipine (NORVASC) 10 MG tablet      Take 1 tablet (5 mg total) by mouth daily.    Take 1 tablet (10 mg total) by mouth daily.   ATORVASTATIN (LIPITOR) 20 MG TABLET atorvastatin (LIPITOR) 20 MG tablet      Take 1 tablet (20 mg total) by mouth daily at 6 PM.    Take 20 mg by mouth daily at 6 PM.   New Prescriptions   ALBUTEROL (PROVENTIL HFA;VENTOLIN HFA) 108 (90 BASE) MCG/ACT INHALER    1 to 2 puffs every 4 to 6 hours as needed   LOSARTAN (COZAAR) 50 MG TABLET    Take 1 tablet (50 mg total) by mouth daily.     Labs/ tests ordered today include:   Orders Placed This Encounter  Procedures  . Basic metabolic panel  . Basic metabolic panel  . Ambulatory referral to Pulmonology  . EKG 12-Lead     Disposition:   I will refer her to the [facility] at [facility] to see if they can assist her with medications.  She was seen by several MDs in the hospital (fellow, [facility] doctor, etc).  But, Dr. Rachelle Hora Croitoru did see her prior to DC.  I will see her in 3-4 weeks.  Eventually FU with Dr. Rachelle Hora Croitoru.   Signed, Brynda Rim, MHS 12/26/2014 5:15 PM    [facility] [address] [place], [place], [place]  [address] Phone: [phone]; Fax: [phone]

## 2014-12-26 ENCOUNTER — Ambulatory Visit (INDEPENDENT_AMBULATORY_CARE_PROVIDER_SITE_OTHER): Payer: Medicaid Other | Admitting: Physician Assistant

## 2014-12-26 ENCOUNTER — Encounter: Payer: Self-pay | Admitting: Physician Assistant

## 2014-12-26 VITALS — BP 160/90 | HR 73 | Ht 63.0 in | Wt 251.4 lb

## 2014-12-26 DIAGNOSIS — E785 Hyperlipidemia, unspecified: Secondary | ICD-10-CM | POA: Diagnosis not present

## 2014-12-26 DIAGNOSIS — Z720 Tobacco use: Secondary | ICD-10-CM

## 2014-12-26 DIAGNOSIS — I119 Hypertensive heart disease without heart failure: Secondary | ICD-10-CM | POA: Diagnosis not present

## 2014-12-26 DIAGNOSIS — I2510 Atherosclerotic heart disease of native coronary artery without angina pectoris: Secondary | ICD-10-CM

## 2014-12-26 DIAGNOSIS — I639 Cerebral infarction, unspecified: Secondary | ICD-10-CM

## 2014-12-26 MED ORDER — LOSARTAN POTASSIUM 50 MG PO TABS: 50.0000 mg | ORAL_TABLET | Freq: Every day | ORAL | Status: DC

## 2014-12-26 MED ORDER — ATORVASTATIN CALCIUM 20 MG PO TABS: 20.0000 mg | ORAL_TABLET | Freq: Every day | ORAL | Status: DC

## 2014-12-26 MED ORDER — AMLODIPINE BESYLATE 5 MG PO TABS: 5.0000 mg | ORAL_TABLET | Freq: Every day | ORAL | Status: DC

## 2014-12-26 MED ORDER — ALBUTEROL SULFATE HFA 108 (90 BASE) MCG/ACT IN AERS: INHALATION_SPRAY | RESPIRATORY_TRACT | Status: DC

## 2014-12-26 NOTE — Patient Instructions (Addendum)
Medication Instructions:  START AMLODIPINE 5 MG ONE DAILY  START LIPITOR (ATORVASTATIN) 20 MG ONE DAILY   START LOSARTAN 50 MG ONE DAILY   START ASPIRIN 81 MG ONE DAILY   START PROVENTIL 1-2 PUFFS EVERY 4-6 HOURS AS NEEDED  Labwork: BMET TODAY  BMET IN 1-2 WEEKS  Testing/Procedures: NONE  Follow-Up: Your physician recommends that you schedule a follow-up appointment in: 3-4 WEEKS WITH SCOTT W PA  Any Other Special Instructions Will Be Listed Below (If Applicable). WILL ARRANGE FOR YOU TO SEE A PULMONARY DOCTOR  WILL ARRANGE FOR YOU TO GET SET UP WITH PRIMARY CARE DOCTOR

## 2014-12-27 LAB — BASIC METABOLIC PANEL
BUN: 12 mg/dL (ref 6–23)
CHLORIDE: 106 meq/L (ref 96–112)
CO2: 28 meq/L (ref 19–32)
Calcium: 9.8 mg/dL (ref 8.4–10.5)
Creatinine, Ser: 0.78 mg/dL (ref 0.40–1.20)
GFR: 96.79 mL/min (ref >60.00)
Glucose, Bld: 88 mg/dL (ref 70–99)
Potassium: 4.3 mEq/L (ref 3.5–5.1)
SODIUM: 143 meq/L (ref 135–145)

## 2014-12-28 ENCOUNTER — Telehealth: Payer: Self-pay | Admitting: *Deleted

## 2014-12-28 ENCOUNTER — Institutional Professional Consult (permissible substitution): Payer: Self-pay | Admitting: Internal Medicine

## 2014-12-28 NOTE — Telephone Encounter (Signed)
Lvm with Amy to have ptcb for results. Amy said to give her results and she will tell pt. I stated did not see DPR on file so I could not give her results. amy said then she did not kow how we would get in contact w/pt. I asked please have pt call us.

## 2015-01-03 ENCOUNTER — Encounter: Payer: Self-pay | Admitting: *Deleted

## 2015-01-03 ENCOUNTER — Telehealth: Payer: Self-pay

## 2015-01-03 NOTE — Telephone Encounter (Signed)
pt not available. I have tried x 3 to reach pt to go over results, do not see DPR on file. I will send out results letter today.

## 2015-01-03 NOTE — Telephone Encounter (Signed)
Patient's daughter called for a muscle relaxer for her mother stating patient has pain and numbness in her legs.  I told her to call her PCP for this, daughter states that patient does not have a PCP but is looking for one.

## 2015-01-15 ENCOUNTER — Institutional Professional Consult (permissible substitution): Payer: Self-pay | Admitting: Internal Medicine

## 2015-01-22 ENCOUNTER — Encounter: Payer: Self-pay | Admitting: Internal Medicine

## 2015-01-22 ENCOUNTER — Ambulatory Visit (INDEPENDENT_AMBULATORY_CARE_PROVIDER_SITE_OTHER): Payer: Medicaid Other | Admitting: Internal Medicine

## 2015-01-22 VITALS — BP 138/88 | HR 89 | Ht 62.0 in | Wt 250.0 lb

## 2015-01-22 DIAGNOSIS — J449 Chronic obstructive pulmonary disease, unspecified: Secondary | ICD-10-CM | POA: Diagnosis not present

## 2015-01-22 DIAGNOSIS — F17210 Nicotine dependence, cigarettes, uncomplicated: Secondary | ICD-10-CM | POA: Diagnosis not present

## 2015-01-22 DIAGNOSIS — Z720 Tobacco use: Secondary | ICD-10-CM | POA: Diagnosis not present

## 2015-01-22 MED ORDER — FAMOTIDINE 20 MG PO TABS: ORAL_TABLET | ORAL | Status: DC

## 2015-01-22 MED ORDER — PANTOPRAZOLE SODIUM 40 MG PO TBEC: 40.0000 mg | DELAYED_RELEASE_TABLET | Freq: Every day | ORAL | Status: DC

## 2015-01-22 NOTE — Progress Notes (Signed)
Subjective:    Patient ID: [name], female    DOB: February 14, 1956,  MRN: [id number]  HPI  30 yobf active smoker always problem with wt even as child but able to play bball in HS with progrssively worse since then assoc with wt gain, sob particularly x winter of 2016 so referred to pulmonary clinic 01/22/2015 by Tereso Newcomer p [facility] 10/09/14   Angiographicall normal Coronary Arteries with moderate disease in an RPL branch - too distal for PCI  3rd RPLB lesion, 50% stenosed.  Hyperdynamic LV with near cavity obliteration & severely elevated LVEDP  Severe systemic HTN   01/22/2015 1st [facility] office visit/ [facility]   Chief Complaint  Patient presents with  . Pulmonary Consult    Referred by Dr Alben Spittle for increased SOB. Pt states worseningg SOB over the past 6 months. Pt states wheezing at times with a productive cough. PCP dx in past with COPD  indolent onset progressive x 6 m cough/ wheeze esp at hs not much better with saba / no purulent sputum. Sob bed to w/c now/ has to keep hob > 30 degrees but no 02 No better p saba   No obvious other patterns in day to day or daytime variabilty or assoc chronic cough or cp or chest tightness, subjective wheeze overt sinus or hb symptoms. No unusual exp hx or h/o childhood pna/ asthma or knowledge of premature birth.   Also denies any obvious fluctuation of symptoms with weather or environmental changes or other aggravating or alleviating factors except as outlined above   Current Medications, Allergies, Complete Past Medical History, Past Surgical History, Family History, and Social History were reviewed in [employer] record.             Review of Systems  Constitutional: Negative for fever, chills and unexpected weight change.  HENT: Negative for congestion, dental problem, ear pain, nosebleeds, postnasal drip, rhinorrhea, sinus pressure, sneezing, sore throat, trouble swallowing and voice change.   Eyes:  Negative for visual disturbance.  Respiratory: Positive for cough and shortness of breath. Negative for choking.   Cardiovascular: Positive for chest pain. Negative for leg swelling.  Gastrointestinal: Negative for vomiting, abdominal pain and diarrhea.  Genitourinary: Negative for difficulty urinating.  Musculoskeletal: Negative for arthralgias.  Skin: Negative for rash.  Neurological: Negative for tremors, syncope and headaches.  Hematological: Does not bruise/bleed easily.       Objective:   Physical Exam  Extremely hoarse obese bf / classic pseudoweeze  Wt Readings from Last 3 Encounters:  01/22/15 250 lb (113.399 kg)  12/26/14 251 lb 6.4 oz (114.034 kg)  10/07/14 256 lb 9.9 oz (116.4 kg)    Vital signs reviewed   HEENT: nl dentition, turbinates, and orophanx. Nl external ear canals without cough reflex   NECK :  without JVD/Nodes/TM/ nl carotid upstrokes bilaterally   LUNGS: no acc muscle use, clear to A and P bilaterally without cough on insp or exp maneuvers   CV:  RRR  no s3 or murmur or increase in P2,   Trace bilateral lower ext  Edema/ chronic venous stasis changes    ABD:  soft and nontender with nl excursion in the supine position. No bruits or organomegaly, bowel sounds nl  MS:  warm without deformities, calf tenderness, cyanosis or clubbing  SKIN: warm and dry without lesions    NEURO:  alert, approp, no deficits      I personally reviewed images and agree with radiology impression  as follows:  CXR:  12/05/14 Cardiac enlargement but no acute pulmonary findings.    Chemistry      Component Value Date/Time   NA 143 12/26/2014 1657   K 4.3 12/26/2014 1657   CL 106 12/26/2014 1657   CO2 28 12/26/2014 1657   BUN 12 12/26/2014 1657   CREATININE 0.78 12/26/2014 1657      Component Value Date/Time   CALCIUM 9.8 12/26/2014 1657   ALKPHOS 67 04/27/2012 1315   AST 18 04/27/2012 1315   ALT 13 04/27/2012 1315   BILITOT 0.3 04/27/2012 1315          Lab Results  Component Value Date   TSH 0.978 04/27/2012     Lab Results  Component Value Date   WBC 9.3 10/11/2014   HGB 14.1 10/11/2014   HCT 43.8 10/11/2014   MCV 84.2 10/11/2014   PLT 132* 10/11/2014          Assessment & Plan:

## 2015-01-22 NOTE — Assessment & Plan Note (Signed)

## 2015-01-22 NOTE — Patient Instructions (Addendum)
Pantoprazole (protonix) 40 mg   Take  30-60 min before first meal of the day and Pepcid (famotidine)  20 mg one @  bedtime until return to office - this is the best way to tell whether stomach acid is contributing to your problem.   GERD (REFLUX)  is an extremely common cause of respiratory symptoms just like yours , many times with no obvious heartburn at all.    It can be treated with medication, but also with lifestyle changes including elevation of the head of your bed (ideally with 6 inch  bed blocks),  Smoking cessation, avoidance of late meals, excessive alcohol, and avoid fatty foods, chocolate, peppermint, colas, red wine, and acidic juices such as orange juice.  NO MINT OR MENTHOL PRODUCTS SO NO COUGH DROPS  USE SUGARLESS CANDY INSTEAD (Jolley ranchers or Stover's or Life Savers) or even ice chips will also do - the key is to swallow to prevent all throat clearing. NO OIL BASED VITAMINS - use powdered substitutes.   The key is to stop smoking completely before smoking completely stops you!   Please schedule a follow up office visit in 4 weeks, sooner if needed with pfts  Needs bnp/ tsh on eturn

## 2015-01-23 NOTE — Assessment & Plan Note (Addendum)
Despite still smoking I really don't think she has much copd clinically but whatever is causing her sob is difficult to control  DDX of  difficult airways management all start with A and  include Adherence, Ace Inhibitors, Acid Reflux, Active Sinus Disease, Alpha 1 Antitripsin deficiency, Anxiety masquerading as Airways dz,  ABPA,  allergy(esp in young), Aspiration (esp in elderly), Adverse effects of meds,  Active smokers, A bunch of PE's (a small clot burden can't cause this syndrome unless there is already severe underlying pulm or vascular dz with poor reserve) plus two Bs  = Bronchiectasis and Beta blocker use..and one C= CHF  Adherence is always the initial "prime suspect" and is a multilayered concern that requires a "trust but verify" approach in every patient - starting with knowing how to use medications, especially inhalers, correctly, keeping up with refills and understanding the fundamental difference between maintenance and prns vs those medications only taken for a very short course and then stopped and not refilled.  - The proper method of use, as well as anticipated side effects, of a metered-dose inhaler are discussed and demonstrated to the patient. Improved effectiveness after extensive coaching during this visit to a level of approximately  75% from a baselind of < 25%  ? Acid (or non-acid) GERD > always difficult to exclude as up to 75% of pts in some series report no assoc GI/ Heartburn symptoms> rec max (24h)  acid suppression and diet restrictions/ reviewed and instructions given in writing.   ? ACEi > not on them but note For reasons that may related to vascular permability and nitric oxide pathways but not elevated  bradykinin levels (as seen with  ACEi use) losartan in the generic form has been reported now from mulitple sources  to cause a similar pattern of non-specific  upper airway symptoms as seen with acei.   This has not been reported with exposure to the other ARB's to  date, so it seems reasonable for now to consider generic diovan or avapro if ARB  See:  Dewayne Hatch Allergy Asthma Immunol  2008: 101: p 495-499    ? CHF/ diastolic dysfunction > note marked elevation of LVEDP at cath > rx per cards    Active smoking discussed separately   I had an extended discussion with the patient reviewing all relevant studies completed to date and  lasting 35 m   Needs noct ono RA and pfts to complete the w/u.  High risk dvt / pe and PAH but nothing to suggest this yet.   Each maintenance medication was reviewed in detail including most importantly the difference between maintenance and prns and under what circumstances the prns are to be triggered using an action plan format that is not reflected in the computer generated alphabetically organized AVS.    Please see instructions for details which were reviewed in writing and the patient given a copy highlighting the part that I personally wrote and discussed at today's ov.

## 2015-01-23 NOTE — Assessment & Plan Note (Signed)
Body mass index is 45.71 kg/(m^2).  Lab Results  Component Value Date   TSH 0.978 04/27/2012     Contributing to gerd tendency/ doe/reviewed need  achieve and maintain neg calorie balance > defer f/u primary care including intermittently monitoring thyroid status

## 2015-01-23 NOTE — Progress Notes (Signed)
Cardiology Office Note   Date:  01/24/2015   ID:  [name], DOB Jun 18, 1955, MRN [id number]  PCP:  Robin Newcomer, PA-C  Cardiologist:  Chart lists Dr. Charlton [name] for unclear reasons >> will have her FU with Dr. Thurmon [name]    Chief Complaint  Patient presents with  . Coronary Artery Disease  . Follow-up    HTN     History of Present Illness: [name] is a 59 y.o. female with a hx of reported CAD. Patient noted cardiac catheterization in 2001 with balloon angioplasty. Other history includes HTN, prior stroke, asthma, tobacco abuse. She is wheelchair-bound because of knee issues.  Admitted 09/2014 with chest discomfort in the setting of uncontrolled hypertension. She had mildly elevated troponins with flat trend. Cardiac catheterization demonstrated 50% stenosis in a third branch of RPLB. This was far too distal to consider PCI. She had otherwise normal coronary arteries. Etiology of elevated troponin was felt to be related to accelerated hypertension. She was noted to have a hyperdynamic LV with severe systolic hypertension and LVEDP 30-35. Patient was noted to have near cavity obliteration.  I saw her 12/26/14 for FU.  She had been out of her medications for 2 mos.  She could not afford to get them.  Although, she continued to smoke.  I attempted to get her assistance via the [facility] at [facility].  However with Medicaid, she did not qualify.  I tried to refill her medications (Amlodipine 5, Losartan 50, Lipitor 20, Albuterol).  I referred her to Pulmonology and she recently saw [name].  PFTs are planned.  He suggested Diovan or Avapro as Losartan has caused some upper airways symptoms similar to ACE inhibitor symptoms.    She returns for FU.     Studies/Reports Reviewed Today:  LHC 10/09/14 LAD:  Normal LCx:  Normal RCA:  Normal; RPLB3 50% EF 65%  Angiographicall normal Coronary Arteries with moderate disease in an RPL branch - too distal for  PCI  3rd RPLB lesion, 50% stenosed.  Hyperdynamic LV with near cavity obliteration & severely elevated LVEDP  Severe systemic HTN  Etiology for + Troponin is most likely related to Accelerated HTN. No Angiographically significant CAD. Hyperdynamic LV with Severe Systolic HTN & LVEDP of ~30-35 mmHg - but near cavity obliteration  Plan: Standard post Radial Cath Care with TR Band removal. Aggressive BP management Discharge planning per primary team - once BP controlled.  Echo 10/07/14 - Severe LVH. Systolic function wasvigorous. EF 65%to 70%. Wall motion was normal  - Aortic valve: Valve area (VTI): 2.11 cm^2. Valve area (Vmax): 2.2cm^2. - Left atrium: The atrium was mildly dilated. - Technically difficult study. Echocontrast is used to [employer].   Past Medical History  Diagnosis Date  . Arthritis   . Stroke   . Coronary artery disease     a. remote hx of cath ~2000. B. cath  moderate disease in an RPL branch - too distal for PCI; 3rd RPLB lesion, 50% stenosed; hyperdynamic LV with near cavity obliteration & severely elevated LVEDP  . Asthma   . 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 Non-ischemic cardiomyopathy     cxr - mild cardiomegly, Echo 10/07/14 - severe LVH with LVEF 65-70%.  . Hypertension   . Heart attack     Past Surgical History  Procedure Laterality Date  . Abdominal hysterectomy    . I&d extremity  08/18/2011    Procedure: IRRIGATION AND DEBRIDEMENT EXTREMITY;  Surgeon: Robin Covert, MD;  Location: [facility] OR;  Service: Orthopedics;  Laterality: Right;  I&D Right Long Finger  . Amputation  08/21/2011    Procedure: AMPUTATION DIGIT;  Surgeon: Robin Covert, MD;  Location: [facility] OR;  Service: Orthopedics;  Laterality: Right;  . Flexible sigmoidoscopy  04/28/2012    Procedure: FLEXIBLE SIGMOIDOSCOPY;  Surgeon: Robin Kuba, MD;  Location: [facility];  Service: Endoscopy;  Laterality: N/A;   unsedated, unprepped  . Cardiac catheterization N/A 10/09/2014    Procedure: Left Heart Cath and Coronary Angiography;  Surgeon: Robin Lex, MD;  Location: [facility] INVASIVE CV LAB CUPID;  Service: Cardiovascular;  Laterality: N/A;  . Coronary stent placement       Current Outpatient Prescriptions  Medication Sig Dispense Refill  . albuterol (PROVENTIL HFA;VENTOLIN HFA) 108 (90 BASE) MCG/ACT inhaler 1 to 2 puffs every 4 to 6 hours as needed 1 Inhaler 5  . amLODipine (NORVASC) 5 MG tablet Take 1 tablet (5 mg total) by mouth daily. 30 tablet 5  . aspirin EC 81 MG EC tablet Take 1 tablet (81 mg total) by mouth daily.    Marland Kitchen atorvastatin (LIPITOR) 20 MG tablet Take 1 tablet (20 mg total) by mouth daily at 6 PM. 30 tablet 5  . famotidine (PEPCID) 20 MG tablet One at bedtime 30 tablet 2  . losartan (COZAAR) 50 MG tablet Take 1 tablet (50 mg total) by mouth daily. 30 tablet 5  . pantoprazole (PROTONIX) 40 MG tablet Take 1 tablet (40 mg total) by mouth daily. Take 30-60 min before first meal of the day 30 tablet 2   No current facility-administered medications for this visit.    Allergies:   Penicillins and Penicillins    Social History:  The patient  reports that she has been smoking Cigarettes.  She has a 10 pack-year smoking history. She has quit using smokeless tobacco. Her smokeless tobacco use included Snuff. She reports that she drinks about 3.6 oz of alcohol per week. She reports that she does not use illicit drugs.   Family History:  The patient's family history includes Arthritis in her mother; Asthma in her sister; Bronchitis in her sister; Diabetes type I in her brother, mother, and sister; Diabetes type II in her brother; Healthy in her brother, brother, and brother; Heart disease in her brother; Hypertension in her brother, mother, and sister; Stroke in her sister.    ROS:   Please see the history of present illness.   Review of Systems  All other systems reviewed and are negative.      PHYSICAL EXAM: VS:  There were no vitals taken for this visit.    Wt Readings from Last 3 Encounters:  01/22/15 250 lb (113.399 kg)  12/26/14 251 lb 6.4 oz (114.034 kg)  10/07/14 256 lb 9.9 oz (116.4 kg)     GEN: Well nourished, well developed, in no acute distress HEENT: normal Neck: no JVD at 90 degrees,  no masses Cardiac:  Normal S1/S2, RRR; no murmur, no rubs or gallops, trace bilateral LE edema   Respiratory:  Decreased breath sounds bilaterally, expiratory rhonchi, no rales, no wheezing  GI: soft, nontender, nondistended, + BS MS: no deformity or atrophy Skin: warm and dry  Neuro:  CNs II-XII intact, Strength and sensation are intact Psych: Normal affect   EKG:  EKG is not ordered today.  It demonstrates:      Recent Labs: 10/11/2014:  Hemoglobin 14.1; Platelets 132* 12/26/2014: BUN 12; Creatinine, Ser 0.78; Potassium 4.3; Sodium 143    Lipid Panel    Component Value Date/Time   CHOL 154 10/07/2014 0442   TRIG 108 10/07/2014 0442   HDL 46 10/07/2014 0442   CHOLHDL 3.3 10/07/2014 0442   VLDL 22 10/07/2014 0442   LDLCALC 86 10/07/2014 0442      ASSESSMENT AND PLAN:  Coronary Artery Disease:   She had normal coronary arteries aside for a distal RPLB branch with 50% stenosis.   Amlodipine 5 QD, ASA 81 QD, Lipitor 20 QD.     Hypertensive Heart Disease:    Hyperlipidemia:  Continue Lipitor 20 QD.   Tobacco Abuse:  FU with Pulmonology.    Prior CVA:  Continue ASA, statin.  Needs strict BP control.      Medication Changes: Current medicines are reviewed at length with the patient today.  Concerns regarding medicines are as outlined above.  The following changes have been made:   Discontinued Medications   No medications on file   Modified Medications   No medications on file   New Prescriptions   No medications on file    Labs/ tests ordered today include:   No orders of the defined types were placed in this encounter.     Disposition:    FU     Signed, Brynda Rim, MHS 01/24/2015 9:17 AM    [facility] [address] [place], [place], [place]  [address] Phone: [phone]; Fax: [phone]    This encounter was created in error - please disregard.

## 2015-01-24 ENCOUNTER — Encounter: Payer: Self-pay | Admitting: Physician Assistant

## 2015-01-30 ENCOUNTER — Telehealth: Payer: Self-pay | Admitting: Internal Medicine

## 2015-01-30 DIAGNOSIS — J449 Chronic obstructive pulmonary disease, unspecified: Secondary | ICD-10-CM

## 2015-01-30 NOTE — Telephone Encounter (Signed)
ONO on RA reviewed- per Dr Sherene Sires, pt needs to start noct o2 2lpm and then repeat ONO on 2lpm  LMTCB for the pt

## 2015-01-31 ENCOUNTER — Encounter: Payer: Self-pay | Admitting: Physician Assistant

## 2015-02-02 NOTE — Telephone Encounter (Signed)
LMTCB x2 for pt 

## 2015-02-05 ENCOUNTER — Encounter: Payer: Medicaid Other | Admitting: Physician Assistant

## 2015-02-06 NOTE — Telephone Encounter (Signed)
Spoke with the pt's daughter and notified of recs per MW  She verbalized understanding  Order was sent to [facility]

## 2015-02-14 ENCOUNTER — Telehealth: Payer: Self-pay | Admitting: Internal Medicine

## 2015-02-14 NOTE — Telephone Encounter (Signed)
Per MW- ONO on 2lpm was normal  Needs to continue 2lpm with sleep  I called and spoke with the pt's daughter and notified of this and she verbalized understanding and will let the pt know

## 2015-02-15 ENCOUNTER — Emergency Department (HOSPITAL_COMMUNITY): Payer: Medicaid Other

## 2015-02-15 ENCOUNTER — Inpatient Hospital Stay (HOSPITAL_COMMUNITY)
Admission: EM | Admit: 2015-02-15 | Discharge: 2015-02-17 | DRG: 305 | Disposition: A | Discharge status: Home / Self Care | Payer: Medicaid Other | Attending: Internal Medicine | Admitting: Internal Medicine

## 2015-02-15 ENCOUNTER — Encounter (HOSPITAL_COMMUNITY): Payer: Self-pay | Admitting: Emergency Medicine

## 2015-02-15 DIAGNOSIS — Z880 Allergy status to penicillin: Secondary | ICD-10-CM

## 2015-02-15 DIAGNOSIS — R7989 Other specified abnormal findings of blood chemistry: Secondary | ICD-10-CM

## 2015-02-15 DIAGNOSIS — Z8249 Family history of ischemic heart disease and other diseases of the circulatory system: Secondary | ICD-10-CM | POA: Diagnosis not present

## 2015-02-15 DIAGNOSIS — I119 Hypertensive heart disease without heart failure: Secondary | ICD-10-CM | POA: Diagnosis not present

## 2015-02-15 DIAGNOSIS — F17210 Nicotine dependence, cigarettes, uncomplicated: Secondary | ICD-10-CM | POA: Diagnosis present

## 2015-02-15 DIAGNOSIS — J9610 Chronic respiratory failure, unspecified whether with hypoxia or hypercapnia: Secondary | ICD-10-CM

## 2015-02-15 DIAGNOSIS — I160 Hypertensive urgency: Secondary | ICD-10-CM | POA: Diagnosis present

## 2015-02-15 DIAGNOSIS — Z993 Dependence on wheelchair: Secondary | ICD-10-CM

## 2015-02-15 DIAGNOSIS — I2510 Atherosclerotic heart disease of native coronary artery without angina pectoris: Secondary | ICD-10-CM | POA: Diagnosis present

## 2015-02-15 DIAGNOSIS — E785 Hyperlipidemia, unspecified: Secondary | ICD-10-CM

## 2015-02-15 DIAGNOSIS — R079 Chest pain, unspecified: Secondary | ICD-10-CM | POA: Diagnosis not present

## 2015-02-15 DIAGNOSIS — R001 Bradycardia, unspecified: Secondary | ICD-10-CM | POA: Diagnosis present

## 2015-02-15 DIAGNOSIS — R531 Weakness: Secondary | ICD-10-CM | POA: Diagnosis not present

## 2015-02-15 DIAGNOSIS — Z8673 Personal history of transient ischemic attack (TIA), and cerebral infarction without residual deficits: Secondary | ICD-10-CM | POA: Diagnosis not present

## 2015-02-15 DIAGNOSIS — I248 Other forms of acute ischemic heart disease: Secondary | ICD-10-CM | POA: Diagnosis present

## 2015-02-15 DIAGNOSIS — Z823 Family history of stroke: Secondary | ICD-10-CM | POA: Diagnosis not present

## 2015-02-15 DIAGNOSIS — I10 Essential (primary) hypertension: Secondary | ICD-10-CM

## 2015-02-15 DIAGNOSIS — R600 Localized edema: Secondary | ICD-10-CM | POA: Diagnosis present

## 2015-02-15 DIAGNOSIS — Z833 Family history of diabetes mellitus: Secondary | ICD-10-CM

## 2015-02-15 DIAGNOSIS — Z825 Family history of asthma and other chronic lower respiratory diseases: Secondary | ICD-10-CM | POA: Diagnosis not present

## 2015-02-15 DIAGNOSIS — E662 Morbid (severe) obesity with alveolar hypoventilation: Secondary | ICD-10-CM | POA: Diagnosis present

## 2015-02-15 DIAGNOSIS — I429 Cardiomyopathy, unspecified: Secondary | ICD-10-CM | POA: Diagnosis present

## 2015-02-15 DIAGNOSIS — R011 Cardiac murmur, unspecified: Secondary | ICD-10-CM | POA: Diagnosis present

## 2015-02-15 DIAGNOSIS — M25569 Pain in unspecified knee: Secondary | ICD-10-CM | POA: Diagnosis present

## 2015-02-15 DIAGNOSIS — I159 Secondary hypertension, unspecified: Secondary | ICD-10-CM

## 2015-02-15 DIAGNOSIS — J9611 Chronic respiratory failure with hypoxia: Secondary | ICD-10-CM | POA: Diagnosis not present

## 2015-02-15 DIAGNOSIS — I252 Old myocardial infarction: Secondary | ICD-10-CM | POA: Diagnosis not present

## 2015-02-15 DIAGNOSIS — G8929 Other chronic pain: Secondary | ICD-10-CM | POA: Diagnosis present

## 2015-02-15 DIAGNOSIS — R778 Other specified abnormalities of plasma proteins: Secondary | ICD-10-CM

## 2015-02-15 DIAGNOSIS — M1990 Unspecified osteoarthritis, unspecified site: Secondary | ICD-10-CM | POA: Diagnosis present

## 2015-02-15 DIAGNOSIS — Z9981 Dependence on supplemental oxygen: Secondary | ICD-10-CM | POA: Diagnosis not present

## 2015-02-15 DIAGNOSIS — Z66 Do not resuscitate: Secondary | ICD-10-CM | POA: Diagnosis present

## 2015-02-15 DIAGNOSIS — J45909 Unspecified asthma, uncomplicated: Secondary | ICD-10-CM | POA: Diagnosis present

## 2015-02-15 HISTORY — DX: Other chronic pain: G89.29

## 2015-02-15 HISTORY — DX: Cardiac murmur, unspecified: R01.1

## 2015-02-15 HISTORY — DX: Essential (primary) hypertension: I10

## 2015-02-15 HISTORY — DX: Unspecified chronic bronchitis: J42

## 2015-02-15 HISTORY — DX: Dependence on wheelchair: Z99.3

## 2015-02-15 HISTORY — DX: Pain in left knee: M25.562

## 2015-02-15 HISTORY — DX: Pain in right knee: M25.561

## 2015-02-15 LAB — CBC WITH DIFFERENTIAL/PLATELET
Basophils Absolute: 0 10*3/uL (ref 0.0–0.1)
Basophils Relative: 0 % (ref 0–1)
EOS ABS: 0.1 10*3/uL (ref 0.0–0.7)
Eosinophils Relative: 1 % (ref 0–5)
HCT: 47.8 % — ABNORMAL HIGH (ref 36.0–46.0)
HEMOGLOBIN: 15.6 g/dL — AB (ref 12.0–15.0)
LYMPHS ABS: 2.7 10*3/uL (ref 0.7–4.0)
Lymphocytes Relative: 24 % (ref 12–46)
MCH: 26.6 pg (ref 26.0–34.0)
MCHC: 32.6 g/dL (ref 30.0–36.0)
MCV: 81.4 fL (ref 78.0–100.0)
MONOS PCT: 6 % (ref 3–12)
Monocytes Absolute: 0.6 10*3/uL (ref 0.1–1.0)
NEUTROS PCT: 69 % (ref 43–77)
Neutro Abs: 7.7 10*3/uL (ref 1.7–7.7)
Platelets: 187 10*3/uL (ref 150–400)
RBC: 5.87 MIL/uL — ABNORMAL HIGH (ref 3.87–5.11)
RDW: 16.5 % — ABNORMAL HIGH (ref 11.5–15.5)
WBC: 11.2 10*3/uL — ABNORMAL HIGH (ref 4.0–10.5)

## 2015-02-15 LAB — URINE MICROSCOPIC-ADD ON

## 2015-02-15 LAB — URINALYSIS, ROUTINE W REFLEX MICROSCOPIC
Bilirubin Urine: NEGATIVE
Glucose, UA: NEGATIVE mg/dL
Ketones, ur: NEGATIVE mg/dL
LEUKOCYTES UA: NEGATIVE
NITRITE: NEGATIVE
PROTEIN: 100 mg/dL — AB
Specific Gravity, Urine: 1.021 (ref 1.005–1.030)
UROBILINOGEN UA: 0.2 mg/dL (ref 0.0–1.0)
pH: 5.5 (ref 5.0–8.0)

## 2015-02-15 LAB — COMPREHENSIVE METABOLIC PANEL
ALT: 16 U/L (ref 14–54)
AST: 29 U/L (ref 15–41)
Albumin: 4.3 g/dL (ref 3.5–5.0)
Alkaline Phosphatase: 77 U/L (ref 38–126)
Anion gap: 11 (ref 5–15)
BUN: 13 mg/dL (ref 6–20)
CHLORIDE: 104 mmol/L (ref 101–111)
CO2: 29 mmol/L (ref 22–32)
CREATININE: 0.71 mg/dL (ref 0.44–1.00)
Calcium: 9.8 mg/dL (ref 8.9–10.3)
GFR calc non Af Amer: >60 mL/min (ref >60)
Glucose, Bld: 83 mg/dL (ref 65–99)
Potassium: 3.5 mmol/L (ref 3.5–5.1)
SODIUM: 144 mmol/L (ref 135–145)
Total Bilirubin: 0.6 mg/dL (ref 0.3–1.2)
Total Protein: 9.1 g/dL — ABNORMAL HIGH (ref 6.5–8.1)

## 2015-02-15 LAB — TROPONIN I
TROPONIN I: 0.28 ng/mL — AB (ref <0.031)
Troponin I: 0.25 ng/mL — ABNORMAL HIGH (ref <0.031)

## 2015-02-15 LAB — I-STAT CG4 LACTIC ACID, ED: Lactic Acid, Venous: 2.05 mmol/L (ref 0.5–2.0)

## 2015-02-15 LAB — CK: Total CK: 347 U/L — ABNORMAL HIGH (ref 38–234)

## 2015-02-15 MED ORDER — SODIUM CHLORIDE 0.9 % IV SOLN
INTRAVENOUS | Status: DC
  Administered 2015-02-15: 1000 mL via INTRAVENOUS

## 2015-02-15 MED ORDER — SODIUM CHLORIDE 0.9 % IV SOLN: INTRAVENOUS | Status: DC

## 2015-02-15 MED ORDER — ENOXAPARIN SODIUM 60 MG/0.6ML ~~LOC~~ SOLN
55.0000 mg | SUBCUTANEOUS | Status: DC
  Administered 2015-02-15 – 2015-02-16 (×2): 55 mg via SUBCUTANEOUS
  Filled 2015-02-15 (×3): qty 0.6

## 2015-02-15 MED ORDER — HYDRALAZINE HCL 20 MG/ML IJ SOLN
10.0000 mg | Freq: Once | INTRAMUSCULAR | Status: AC
  Administered 2015-02-15: 10 mg via INTRAVENOUS
  Filled 2015-02-15: qty 1

## 2015-02-15 MED ORDER — ACETAMINOPHEN 325 MG PO TABS
650.0000 mg | ORAL_TABLET | Freq: Four times a day (QID) | ORAL | Status: DC | PRN
  Administered 2015-02-16 – 2015-02-17 (×3): 650 mg via ORAL
  Filled 2015-02-15 (×4): qty 2

## 2015-02-15 MED ORDER — HYDRALAZINE HCL 25 MG PO TABS
25.0000 mg | ORAL_TABLET | Freq: Four times a day (QID) | ORAL | Status: DC | PRN
  Filled 2015-02-15: qty 1

## 2015-02-15 MED ORDER — SODIUM CHLORIDE 0.9 % IV SOLN
INTRAVENOUS | Status: DC
  Administered 2015-02-15: 21:00:00 via INTRAVENOUS

## 2015-02-15 MED ORDER — ASPIRIN EC 81 MG PO TBEC
81.0000 mg | DELAYED_RELEASE_TABLET | Freq: Every day | ORAL | Status: DC
  Administered 2015-02-15 – 2015-02-17 (×3): 81 mg via ORAL
  Filled 2015-02-15 (×3): qty 1

## 2015-02-15 MED ORDER — LOSARTAN POTASSIUM 50 MG PO TABS
50.0000 mg | ORAL_TABLET | Freq: Once | ORAL | Status: AC
  Administered 2015-02-15: 50 mg via ORAL
  Filled 2015-02-15: qty 1

## 2015-02-15 MED ORDER — ACETAMINOPHEN 650 MG RE SUPP: 650.0000 mg | Freq: Four times a day (QID) | RECTAL | Status: DC | PRN

## 2015-02-15 MED ORDER — METOPROLOL TARTRATE 25 MG PO TABS
25.0000 mg | ORAL_TABLET | Freq: Two times a day (BID) | ORAL | Status: DC
  Administered 2015-02-15 – 2015-02-16 (×2): 25 mg via ORAL
  Filled 2015-02-15 (×3): qty 1

## 2015-02-15 MED ORDER — ATORVASTATIN CALCIUM 20 MG PO TABS
20.0000 mg | ORAL_TABLET | Freq: Every day | ORAL | Status: DC
  Administered 2015-02-16: 20 mg via ORAL
  Filled 2015-02-15 (×2): qty 1

## 2015-02-15 MED ORDER — AMLODIPINE BESYLATE 5 MG PO TABS
5.0000 mg | ORAL_TABLET | Freq: Every day | ORAL | Status: DC
  Administered 2015-02-15 – 2015-02-16 (×2): 5 mg via ORAL
  Filled 2015-02-15 (×2): qty 1

## 2015-02-15 MED ORDER — LOSARTAN POTASSIUM 50 MG PO TABS
50.0000 mg | ORAL_TABLET | Freq: Every day | ORAL | Status: DC
  Administered 2015-02-15 – 2015-02-16 (×2): 50 mg via ORAL
  Filled 2015-02-15 (×2): qty 1

## 2015-02-15 MED ORDER — AMLODIPINE BESYLATE 5 MG PO TABS
5.0000 mg | ORAL_TABLET | Freq: Once | ORAL | Status: AC
  Administered 2015-02-15: 5 mg via ORAL
  Filled 2015-02-15: qty 1

## 2015-02-15 MED ORDER — DIPHENHYDRAMINE HCL 25 MG PO CAPS
25.0000 mg | ORAL_CAPSULE | Freq: Once | ORAL | Status: AC
  Administered 2015-02-15: 25 mg via ORAL
  Filled 2015-02-15: qty 1

## 2015-02-15 MED ORDER — PANTOPRAZOLE SODIUM 40 MG PO TBEC
40.0000 mg | DELAYED_RELEASE_TABLET | Freq: Every day | ORAL | Status: DC
  Administered 2015-02-15: 40 mg via ORAL
  Filled 2015-02-15 (×2): qty 1

## 2015-02-15 NOTE — ED Notes (Signed)
MD Mcmanus notified about elevated lactic acid

## 2015-02-15 NOTE — ED Notes (Signed)
Witnessed that pt stated she wanted to be DNR during hospitalist exam.

## 2015-02-15 NOTE — ED Provider Notes (Signed)
CSN: [id number]     Arrival date & time 02/15/15  1501 History   First MD Initiated Contact with Patient 02/15/15 1549     Chief Complaint  Patient presents with  . Hypertension  . Fatigue     HPI Pt was seen at 1550. Per pt and her family, pt c/o gradual onset and persistence of constant generalized weakness that began overnight last night. Pt took a bath approximately 0300 and "couldn't get out." Pt states she could not get up out of the tub, and sat there until her daughters found her this afternoon PTA. Pt has not taken any of her meds since yesterday. Pt continues to complain only of generalized weakness. Denies syncope, no AMS, no CP/SOB, no abd pain, no N/V/D, no fall, no focal motor weakness, no tingling/numbness in extremities.     Past Medical History  Diagnosis Date  . Arthritis   . Stroke   . Coronary artery disease     a. remote hx of cath ~2000. B. cath  moderate disease in an RPL branch - too distal for PCI; 3rd RPLB lesion, 50% stenosed; hyperdynamic LV with near cavity obliteration & severely elevated LVEDP  . Asthma   . 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 Non-ischemic cardiomyopathy     cxr - mild cardiomegly, Echo 10/07/14 - severe LVH with LVEF 65-70%.  . Hypertension   . Heart attack   . Chronic bronchitis   . Heart murmur   . Wheelchair bound   . Bilateral chronic knee pain   . Accelerated hypertension    Past Surgical History  Procedure Laterality Date  . Abdominal hysterectomy    . I&d extremity  08/18/2011    Procedure: IRRIGATION AND DEBRIDEMENT EXTREMITY;  Surgeon: Sharma Covert, MD;  Location: [facility] OR;  Service: Orthopedics;  Laterality: Right;  I&D Right Long Finger  . Amputation  08/21/2011    Procedure: AMPUTATION DIGIT;  Surgeon: Sharma Covert, MD;  Location: [facility] OR;  Service: Orthopedics;  Laterality: Right;  . Flexible sigmoidoscopy  04/28/2012    Procedure: FLEXIBLE SIGMOIDOSCOPY;   Surgeon: Petra Kuba, MD;  Location: [facility];  Service: Endoscopy;  Laterality: N/A;  unsedated, unprepped  . Cardiac catheterization N/A 10/09/2014    Procedure: Left Heart Cath and Coronary Angiography;  Surgeon: Marykay Lex, MD;  Location: [facility] INVASIVE CV LAB CUPID;  Service: Cardiovascular;  Laterality: N/A;  . Coronary stent placement    . Cardiac catheterization  10/2014    normal coronary arteries aside for a distal RPLB branch with 50% stenosis   Family History  Problem Relation Age of Onset  . Diabetes type I Mother   . Hypertension Mother   . Arthritis Mother   . Stroke Sister   . Diabetes type I Brother   . Hypertension Brother   . Healthy Brother   . Diabetes type II Brother   . Heart disease Brother   . Healthy Brother   . Healthy Brother   . Diabetes type I Sister   . Hypertension Sister   . Asthma Sister   . Bronchitis Sister    Social History  Substance Use Topics  . Smoking status: Current Every Day Smoker -- 0.50 packs/day for 20 years    Types: Cigarettes  . Smokeless tobacco: Former Neurosurgeon    Types: Snuff  . Alcohol Use: 3.6 oz/week    6 Cans of beer per  week     Comment: 04/27/12- "amount varies"   OB History    Gravida Para Term Preterm AB TAB SAB Ectopic Multiple Living   0 0 0 0 0 0 0 0       Review of Systems ROS: Statement: All systems negative except as marked or noted in the HPI; Constitutional: Negative for fever and chills. +generalized weakness.; ; Eyes: Negative for eye pain, redness and discharge. ; ; ENMT: Negative for ear pain, hoarseness, nasal congestion, sinus pressure and sore throat. ; ; Cardiovascular: Negative for chest pain, palpitations, diaphoresis, dyspnea and peripheral edema. ; ; Respiratory: Negative for cough, wheezing and stridor. ; ; Gastrointestinal: Negative for nausea, vomiting, diarrhea, abdominal pain, blood in stool, hematemesis, jaundice and rectal bleeding. . ; ; Genitourinary: Negative for dysuria, flank pain  and hematuria. ; ; Musculoskeletal: Negative for back pain and neck pain. Negative for swelling and trauma.; ; Skin: Negative for pruritus, rash, abrasions, blisters, bruising and skin lesion.; ; Neuro: Negative for headache, lightheadedness and neck stiffness. Negative for altered level of consciousness , altered mental status, extremity weakness, paresthesias, involuntary movement, seizure and syncope.      Allergies  Penicillins and Penicillins  Home Medications   Prior to Admission medications   Medication Sig Start Date End Date Taking? Authorizing Provider  albuterol (PROVENTIL HFA;VENTOLIN HFA) 108 (90 BASE) MCG/ACT inhaler 1 to 2 puffs every 4 to 6 hours as needed 12/26/14  Yes Scott T Weaver, PA-C  amLODipine (NORVASC) 5 MG tablet Take 1 tablet (5 mg total) by mouth daily. 12/26/14  Yes Beatrice Lecher, PA-C  aspirin EC 81 MG EC tablet Take 1 tablet (81 mg total) by mouth daily. 10/10/14  Yes Bhavinkumar Bhagat, PA  atorvastatin (LIPITOR) 20 MG tablet Take 1 tablet (20 mg total) by mouth daily at 6 PM. 12/26/14  Yes Beatrice Lecher, PA-C  famotidine (PEPCID) 20 MG tablet One at bedtime 01/22/15  Yes Nyoka Cowden, MD  losartan (COZAAR) 50 MG tablet Take 1 tablet (50 mg total) by mouth daily. 12/26/14  Yes Scott T Alben Spittle, PA-C  pantoprazole (PROTONIX) 40 MG tablet Take 1 tablet (40 mg total) by mouth daily. Take 30-60 min before first meal of the day 01/22/15  Yes Nyoka Cowden, MD   BP 229/95 mmHg  Pulse 86  Temp(Src) 98.1 F (36.7 C) (Oral)  Resp 34  SpO2 97% Physical Exam  1555: Physical examination:  Nursing notes reviewed; Vital signs and O2 SAT reviewed;  Constitutional: Well developed, Well nourished, In no acute distress; Head:  Normocephalic, atraumatic; Eyes: EOMI, PERRL, No scleral icterus; ENMT: Mouth and pharynx normal, Mucous membranes dry; Neck: Supple, Full range of motion, No lymphadenopathy; Cardiovascular: Regular rate and rhythm, No gallop; Respiratory: Breath sounds  clear & equal bilaterally, No wheezes.  Speaking full sentences with ease, Normal respiratory effort/excursion; Chest: Nontender, Movement normal; Abdomen: Soft, Nontender, Nondistended, Normal bowel sounds; Genitourinary: No CVA tenderness; Extremities: Pulses normal, No tenderness, No edema, No calf edema or asymmetry.; Neuro: AA&Ox3, Major CN grossly intact. No facial droop. Speech clear. Grips equal, strengtn 4/5 bilat UE's and LE's. Pt moves all extremities spontaneously on stretcher without apparent gross focal motor deficits.; Skin: Color normal, Warm, Dry.   ED Course  Procedures (including critical care time) Labs Review   Imaging Review  I have personally reviewed and evaluated these images and lab results as part of my medical decision-making.   EKG Interpretation   Date/Time:  Thursday February 15 2015 15:12:38 EDT Ventricular Rate:  70 PR Interval:  144 QRS Duration: 98 QT Interval:  418 QTC Calculation: 451 R Axis:   45 Text Interpretation:  Sinus rhythm Multiple ventricular premature  complexes Probable left atrial enlargement Repol abnrm suggests ischemia,  lateral leads Nonspecific ST and T wave abnormality Anterior leads When  compared with ECG of 10/06/2014 QT has shortened Confirmed by [facility]  MD,  Nicholos Johns [phone]) on 02/15/2015 5:58:25 PM      EKG Interpretation  Date/Time:  Thursday February 15 2015 18:04:33 EDT Ventricular Rate:  75 PR Interval:  141 QRS Duration: 100 QT Interval:  437 QTC Calculation: 488 R Axis:   47 Text Interpretation:  Sinus rhythm Probable left atrial enlargement Probable anteroseptal infarct, old Repol abnrm suggests ischemia, lateral leads Since last tracing of earlier today No significant change was found Confirmed by [facility]  MD, Nicholos Johns [phone]) on 02/15/2015 6:16:46 PM        MDM  MDM Reviewed: previous chart, nursing note and vitals Reviewed previous: labs and ECG Interpretation: labs, ECG and x-ray      Results  for orders placed or performed during the hospital encounter of 02/15/15  Urinalysis, Routine w reflex microscopic  Result Value Ref Range   Color, Urine YELLOW YELLOW   APPearance CLEAR CLEAR   Specific Gravity, Urine 1.021 1.005 - 1.030   pH 5.5 5.0 - 8.0   Glucose, UA NEGATIVE NEGATIVE mg/dL   Hgb urine dipstick SMALL (A) NEGATIVE   Bilirubin Urine NEGATIVE NEGATIVE   Ketones, ur NEGATIVE NEGATIVE mg/dL   Protein, ur 466 (A) NEGATIVE mg/dL   Urobilinogen, UA 0.2 0.0 - 1.0 mg/dL   Nitrite NEGATIVE NEGATIVE   Leukocytes, UA NEGATIVE NEGATIVE  Comprehensive metabolic panel  Result Value Ref Range   Sodium 144 135 - 145 mmol/L   Potassium 3.5 3.5 - 5.1 mmol/L   Chloride 104 101 - 111 mmol/L   CO2 29 22 - 32 mmol/L   Glucose, Bld 83 65 - 99 mg/dL   BUN 13 6 - 20 mg/dL   Creatinine, Ser 5.99 0.44 - 1.00 mg/dL   Calcium 9.8 8.9 - 35.7 mg/dL   Total Protein 9.1 (H) 6.5 - 8.1 g/dL   Albumin 4.3 3.5 - 5.0 g/dL   AST 29 15 - 41 U/L   ALT 16 14 - 54 U/L   Alkaline Phosphatase 77 38 - 126 U/L   Total Bilirubin 0.6 0.3 - 1.2 mg/dL   GFR calc non Af Amer >60 >60 mL/min   GFR calc Af Amer >60 >60 mL/min   Anion gap 11 5 - 15  CBC with Differential  Result Value Ref Range   WBC 11.2 (H) 4.0 - 10.5 K/uL   RBC 5.87 (H) 3.87 - 5.11 MIL/uL   Hemoglobin 15.6 (H) 12.0 - 15.0 g/dL   HCT 01.7 (H) 79.3 - 90.3 %   MCV 81.4 78.0 - 100.0 fL   MCH 26.6 26.0 - 34.0 pg   MCHC 32.6 30.0 - 36.0 g/dL   RDW 00.9 (H) 23.3 - 00.7 %   Platelets 187 150 - 400 K/uL   Neutrophils Relative % 69 43 - 77 %   Neutro Abs 7.7 1.7 - 7.7 K/uL   Lymphocytes Relative 24 12 - 46 %   Lymphs Abs 2.7 0.7 - 4.0 K/uL   Monocytes Relative 6 3 - 12 %   Monocytes Absolute 0.6 0.1 - 1.0 K/uL   Eosinophils Relative 1 0 - 5 %  Eosinophils Absolute 0.1 0.0 - 0.7 K/uL   Basophils Relative 0 0 - 1 %   Basophils Absolute 0.0 0.0 - 0.1 K/uL  Troponin I  Result Value Ref Range   Troponin I 0.28 (H) <0.031 ng/mL  CK  Result  Value Ref Range   Total CK 347 (H) 38 - 234 U/L  Urine microscopic-add on  Result Value Ref Range   Squamous Epithelial / LPF FEW (A) RARE   WBC, UA 3-6 <3 WBC/hpf   RBC / HPF 3-6 <3 RBC/hpf   Bacteria, UA FEW (A) RARE   Casts HYALINE CASTS (A) NEGATIVE   Urine-Other MUCOUS PRESENT   I-Stat CG4 Lactic Acid, ED  Result Value Ref Range   Lactic Acid, Venous 2.05 (HH) 0.5 - 2.0 mmol/L   Comment NOTIFIED PHYSICIAN    Dg Chest Port 1 View 02/15/2015   CLINICAL DATA:  Hypertension ; headache with blurred vision  EXAM: PORTABLE CHEST - 1 VIEW  COMPARISON:  October 07, 2014  FINDINGS: There is no edema or consolidation. Heart is mildly enlarged with pulmonary vascularity within normal limits. No adenopathy. There is atherosclerotic change in the aortic arch region. There is thoracolumbar levoscoliosis. No adenopathy.  IMPRESSION: Mild cardiac enlargement, stable.  No edema or consolidation.   Electronically Signed   By: Bretta Bang III M.D.   On: 02/15/2015 17:22    1815:  Troponin c/w previous. Pt denies CP. EKG similar to previous. Pt given a dose of her usual BP meds.  Dx and testing d/w pt and family.  Questions answered.  Verb understanding, agreeable to admit. T/C to [facility] Dr. Sharl Ma, case discussed, including:  HPI, pertinent PM/SHx, VS/PE, dx testing, ED course and treatment:  Agreeable to admit, requests to write temporary orders, obtain observation tele bed to team [facility].   Samuel Jester, DO 02/18/15 1058

## 2015-02-15 NOTE — ED Notes (Signed)
Pt from home. Took a bath at 0400 this am. Took bath without her bath chair and was unable to get up out of bathtub. Daughters found pt this afternoon. Pt initially denied any complaints except for hunger, CBG 95, no hx DM. However when EMS took BP it was 230/100. Hx of HTN, did not take BP meds last night. Initially refused any symptoms, but then said she has been having blurred vision and a HA since she was in the tub. Pt alert and oriented.

## 2015-02-15 NOTE — H&P (Signed)
PCP:   Tereso Newcomer, PA-C   Chief Complaint:  Weakness   HPI: 59 year old female who   has a past medical history of Arthritis; Stroke; Coronary artery disease; Asthma; Diverticular disease of large intestine; Non-ischemic cardiomyopathy; Hypertension; Heart attack; Chronic bronchitis; Heart murmur; Wheelchair bound; Bilateral chronic knee pain; and Accelerated hypertension. Today was brought to the hospital with generalized weakness. Patient went to bathroom around 3 AM and sat in the bathtub but could not get out of tub. Under the daughters found this afternoon around 1 PM. Patient had not taken her medications. She was brought to the hospital for generalized weakness. She denies any syncope no chest pain no shortness of breath. No nausea vomiting or diarrhea. No abdominal pain. No fever no dysuria urgency frequency of urination. In the hospital patient was found to be hypertensive urgency with mild elevation of troponin 0.28. Patient had cardiac cath done in May 2016 which showed angiographically normal coronary arteries with moderate disease in right posterior-lateral branch which was too distal for PCI. At that time troponin elevation was felt to be due to the accelerated hypertension. Patient's medications were recently changed per cardiology PA in July 2016.  Allergies:   Allergies  Allergen Reactions  . Penicillins Itching  . Penicillins     Made ' her stomach sick';      Past Medical History  Diagnosis Date  . Arthritis   . Stroke   . Coronary artery disease     a. remote hx of cath ~2000. B. cath  moderate disease in an RPL branch - too distal for PCI; 3rd RPLB lesion, 50% stenosed; hyperdynamic LV with near cavity obliteration & severely elevated LVEDP  . Asthma   . 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 Non-ischemic cardiomyopathy     cxr - mild cardiomegly, Echo 10/07/14 - severe LVH with LVEF 65-70%.  .  Hypertension   . Heart attack   . Chronic bronchitis   . Heart murmur   . Wheelchair bound   . Bilateral chronic knee pain   . Accelerated hypertension     Past Surgical History  Procedure Laterality Date  . Abdominal hysterectomy    . I&d extremity  08/18/2011    Procedure: IRRIGATION AND DEBRIDEMENT EXTREMITY;  Surgeon: Sharma Covert, MD;  Location: [facility] OR;  Service: Orthopedics;  Laterality: Right;  I&D Right Long Finger  . Amputation  08/21/2011    Procedure: AMPUTATION DIGIT;  Surgeon: Sharma Covert, MD;  Location: [facility] OR;  Service: Orthopedics;  Laterality: Right;  . Flexible sigmoidoscopy  04/28/2012    Procedure: FLEXIBLE SIGMOIDOSCOPY;  Surgeon: Petra Kuba, MD;  Location: [facility];  Service: Endoscopy;  Laterality: N/A;  unsedated, unprepped  . Cardiac catheterization N/A 10/09/2014    Procedure: Left Heart Cath and Coronary Angiography;  Surgeon: Marykay Lex, MD;  Location: [facility] INVASIVE CV LAB CUPID;  Service: Cardiovascular;  Laterality: N/A;  . Coronary stent placement    . Cardiac catheterization  10/2014    normal coronary arteries aside for a distal RPLB branch with 50% stenosis    Prior to Admission medications   Medication Sig Start Date End Date Taking? Authorizing Provider  albuterol (PROVENTIL HFA;VENTOLIN HFA) 108 (90 BASE) MCG/ACT inhaler 1 to 2 puffs every 4 to 6 hours as needed 12/26/14  Yes Scott T Weaver, PA-C  amLODipine (NORVASC) 5 MG tablet Take 1 tablet (5 mg total)  by mouth daily. 12/26/14  Yes Beatrice Lecher, PA-C  aspirin EC 81 MG EC tablet Take 1 tablet (81 mg total) by mouth daily. 10/10/14  Yes Bhavinkumar Bhagat, PA  atorvastatin (LIPITOR) 20 MG tablet Take 1 tablet (20 mg total) by mouth daily at 6 PM. 12/26/14  Yes Beatrice Lecher, PA-C  famotidine (PEPCID) 20 MG tablet One at bedtime 01/22/15  Yes Nyoka Cowden, MD  losartan (COZAAR) 50 MG tablet Take 1 tablet (50 mg total) by mouth daily. 12/26/14  Yes Scott T Alben Spittle, PA-C  pantoprazole  (PROTONIX) 40 MG tablet Take 1 tablet (40 mg total) by mouth daily. Take 30-60 min before first meal of the day 01/22/15  Yes Nyoka Cowden, MD    Social History:  reports that she has been smoking Cigarettes.  She has a 10 pack-year smoking history. She has quit using smokeless tobacco. Her smokeless tobacco use included Snuff. She reports that she drinks about 3.6 oz of alcohol per week. She reports that she does not use illicit drugs.  Family History  Problem Relation Age of Onset  . Diabetes type I Mother   . Hypertension Mother   . Arthritis Mother   . Stroke Sister   . Diabetes type I Brother   . Hypertension Brother   . Healthy Brother   . Diabetes type II Brother   . Heart disease Brother   . Healthy Brother   . Healthy Brother   . Diabetes type I Sister   . Hypertension Sister   . Asthma Sister   . Bronchitis Sister     There were no vitals filed for this visit.  All the positives are listed in BOLD  Review of Systems:  HEENT: Headache, blurred vision, runny nose, sore throat Neck: Hypothyroidism, hyperthyroidism,,lymphadenopathy Chest : Shortness of breath, history of COPD, Asthma Heart : Chest pain, history of coronary arterey disease GI:  Nausea, vomiting, diarrhea, constipation, GERD GU: Dysuria, urgency, frequency of urination, hematuria Neuro: Stroke, seizures, syncope Psych: Depression, anxiety, hallucinations   Physical Exam: Blood pressure 231/79, pulse 76, temperature 98.1 F (36.7 C), temperature source Oral, resp. rate 28, SpO2 97 %. Constitutional:   Patient is a well-developed and well-nourished female* in no acute distress and cooperative with exam. Head: Normocephalic and atraumatic Mouth: Mucus membranes moist Eyes: PERRL, EOMI, conjunctivae normal Neck: Supple, No Thyromegaly Cardiovascular: RRR, S1 normal, S2 normal Pulmonary/Chest: CTAB, no wheezes, rales, or rhonchi Abdominal: Soft. Non-tender, non-distended, bowel sounds are normal, no  masses, organomegaly, or guarding present.  Neurological: A&O x3, Strength is normal and symmetric bilaterally, cranial nerve II-XII are grossly intact, no focal motor deficit, sensory intact to light touch bilaterally.  Extremities : No Cyanosis, Clubbing , trace edema of the lower extremities  Labs on Admission:  Basic Metabolic Panel:  Recent Labs Lab 02/15/15 1644  NA 144  K 3.5  CL 104  CO2 29  GLUCOSE 83  BUN 13  CREATININE 0.71  CALCIUM 9.8   Liver Function Tests:  Recent Labs Lab 02/15/15 1644  AST 29  ALT 16  ALKPHOS 77  BILITOT 0.6  PROT 9.1*  ALBUMIN 4.3   CBC:  Recent Labs Lab 02/15/15 1644  WBC 11.2*  NEUTROABS 7.7  HGB 15.6*  HCT 47.8*  MCV 81.4  PLT 187   Cardiac Enzymes:  Recent Labs Lab 02/15/15 1644  CKTOTAL 347*  TROPONINI 0.28*     Radiological Exams on Admission: Dg Chest Port 1 View  02/15/2015  CLINICAL DATA:  Hypertension ; headache with blurred vision  EXAM: PORTABLE CHEST - 1 VIEW  COMPARISON:  October 07, 2014  FINDINGS: There is no edema or consolidation. Heart is mildly enlarged with pulmonary vascularity within normal limits. No adenopathy. There is atherosclerotic change in the aortic arch region. There is thoracolumbar levoscoliosis. No adenopathy.  IMPRESSION: Mild cardiac enlargement, stable.  No edema or consolidation.   Electronically Signed   By: Bretta Bang III M.D.   On: 02/15/2015 17:22    EKG: Independently reviewed. Sinus rhythm, nonspecific ST-T changes   Assessment/Plan Active Problems:   Coronary artery disease   Hypertensive heart disease   HLD (hyperlipidemia)   Weakness   Hypertensive urgency  Accelerated hypertension Patient takes amlodipine 5 mg by mout DO NOT RESUSCITATE h daily along with Cozaar 50 mg by mouth daily. Restart these medications also start metoprolol 25 mg by mouth twice a day. One dose of hydralazine 10 mg IV given in the ED, will start hydralazine 25 g by mouth every 6 hours  when necessary for BP greater than 160/100.  Elevated troponin Likely from demand ischemia from accelerated hypertension, will cycle the cardiac enzymes. Patient had previous episodes of troponin elevation from the same in the past. She is not having any chest pain at this time.  CAD Recent cardiac cath showed normal coronaries with small lesion in the right posterolateral branch which was too distal for PCI. Continue aspirin.  DVT prophylaxis Lovenox   Code status: DO NOT RESUSCITATE  Family discussion: Discussed the CODE STATUS with the patient with daughter at the bedside and RN. The patient's daughter did not agree with DO NOT RESUSCITATE status patient was adamant to be DO NOT RESUSCITATE. Patient is in her right mind to make this decision.   Time Spent on Admission: 60 min  LAMA,GAGAN S [facility] Pager: [phone] 02/15/2015, 6:55 PM  If 7PM-7AM, please contact night-coverage  [email or web address]  Password [email or web address]

## 2015-02-16 LAB — TROPONIN I
TROPONIN I: 0.26 ng/mL — AB (ref <0.031)
Troponin I: 0.25 ng/mL — ABNORMAL HIGH (ref <0.031)

## 2015-02-16 LAB — CK: CK TOTAL: 281 U/L — AB (ref 38–234)

## 2015-02-16 MED ORDER — LOSARTAN POTASSIUM 50 MG PO TABS
50.0000 mg | ORAL_TABLET | Freq: Once | ORAL | Status: AC
  Administered 2015-02-16: 50 mg via ORAL
  Filled 2015-02-16: qty 1

## 2015-02-16 MED ORDER — AMLODIPINE BESYLATE 10 MG PO TABS
10.0000 mg | ORAL_TABLET | Freq: Every day | ORAL | Status: DC
  Administered 2015-02-17: 10 mg via ORAL
  Filled 2015-02-16: qty 1

## 2015-02-16 MED ORDER — DOXAZOSIN MESYLATE 4 MG PO TABS
4.0000 mg | ORAL_TABLET | Freq: Every day | ORAL | Status: DC
  Administered 2015-02-16: 4 mg via ORAL
  Filled 2015-02-16 (×2): qty 1

## 2015-02-16 MED ORDER — HYDRALAZINE HCL 50 MG PO TABS
50.0000 mg | ORAL_TABLET | Freq: Three times a day (TID) | ORAL | Status: DC
  Administered 2015-02-16 – 2015-02-17 (×4): 50 mg via ORAL
  Filled 2015-02-16 (×6): qty 1

## 2015-02-16 MED ORDER — DIPHENHYDRAMINE HCL 25 MG PO CAPS
25.0000 mg | ORAL_CAPSULE | Freq: Once | ORAL | Status: AC
  Administered 2015-02-16: 25 mg via ORAL
  Filled 2015-02-16: qty 1

## 2015-02-16 MED ORDER — HYDROCHLOROTHIAZIDE 25 MG PO TABS
25.0000 mg | ORAL_TABLET | Freq: Every day | ORAL | Status: DC
  Administered 2015-02-16 – 2015-02-17 (×2): 25 mg via ORAL
  Filled 2015-02-16 (×2): qty 1

## 2015-02-16 MED ORDER — LOSARTAN POTASSIUM 50 MG PO TABS
100.0000 mg | ORAL_TABLET | Freq: Every day | ORAL | Status: DC
  Administered 2015-02-17: 100 mg via ORAL
  Filled 2015-02-16: qty 2

## 2015-02-16 MED ORDER — ISOSORBIDE MONONITRATE ER 30 MG PO TB24
30.0000 mg | ORAL_TABLET | Freq: Every day | ORAL | Status: DC
  Administered 2015-02-16 – 2015-02-17 (×2): 30 mg via ORAL
  Filled 2015-02-16 (×2): qty 1

## 2015-02-16 NOTE — Progress Notes (Signed)
[facility] Progress Note   [name]  YSH:[id number]  DOB: 1955/06/12  DOA: 02/15/2015 PCP: No primary care provider on file.  Brief narrative: [name] is a 59 y.o. female with morbid obesity, hypertension with hypertensive heart disease, tobacco abuse, hyperlipidemia, CVA. The patient got stuck in her tub and therefore EMS was called. They noted her blood pressure was elevated and brought her into the hospital. The patient states that she has not had medications and close to 2 months. Her PCP has closed his practice. It appears per records in Epic her medications were last filled in July by the cardiology group. She states that for some reason Medicaid will not pay for her medications. She is having difficulty telling me which medications she is unable to obtain as she cannot remember which medications she is on.  Subjective: No complaints. No shortness of breath chest pain cough nausea or vomiting. States that she has been out of her medications and has not been careful with low-sodium diet.  Assessment/Plan: Principal Problem:   Hypertensive urgency- severe LVH/diastolic dysfunction -Systolic blood pressure still close to 190 -has not gotten medications refilled-She has Medicaid which would pay for her medications and therefore I'm concerned this may just be a compliance issue -Continue Norvasc, Cozaar. Previously also on doxazosin and Imdur or which I have restarted. Have added hydralazine and HCTZ   Active Problems: Sinus bradycardia -A few episodes of sinus bradycardia through the night-have discontinued Lopressor that was started yesterday-we'll continue to follow  Elevated troponin -No chest pain - Troponins remaining stable at 0.2- last admitted in the spring troponins were about the same and therefore she may have a chronic mild elevation  Chronic respiratory failure -Chronically on oxygen-likely has obesity hypoventilation syndrome  Mild pedal  edema -Possibly right heart failure versus venous stasis --added HCTZ    Coronary artery disease -Nonobstructive per last cath in April    HLD (hyperlipidemia) - Lipitor  Morbid obesity -Appears that she has gained weight per history-nutrition consult to instruct on diet -Possibly has sleep apnea   Code Status:     Code Status Orders        Start     Ordered   02/15/15 2050  Do not attempt resuscitation (DNR)   Continuous    Question Answer Comment  In the event of cardiac or respiratory ARREST Do not call a "code blue"   In the event of cardiac or respiratory ARREST Do not perform Intubation, CPR, defibrillation or ACLS   In the event of cardiac or respiratory ARREST Use medication by any route, position, wound care, and other measures to relive pain and suffering. May use oxygen, suction and manual treatment of airway obstruction as needed for comfort.      02/15/15 2049     Disposition Plan: Home when BP stable DVT prophylaxis: Lovenox Consultants: Procedures: Appt with PCP: She states she does not have a PCP-asked case management to look into this-with Medicaid she should have a PCP Antibiotics: Anti-infectives    None      Objective: Filed Weights   02/15/15 2059  Weight: 119.024 kg (262 lb 6.4 oz)    Intake/Output Summary (Last 24 hours) at 02/16/15 1444 Last data filed at 02/16/15 1427  Gross per 24 hour  Intake    720 ml  Output      0 ml  Net    720 ml     Vitals Filed Vitals:   02/16/15 0042 02/16/15 1155  02/16/15 1125 02/16/15 1426  BP: 191/82 189/69 186/60 167/65  Pulse: 72 68  63  Temp:  98.1 F (36.7 C)  98.5 F (36.9 C)  TempSrc:  Oral  Oral  Resp: Weight:      SpO2: 96% 97%  100%    Exam:  General:  Pt is alert, not in acute distress  HEENT: No icterus, No thrush, oral mucosa moist  Cardiovascular: regular rate and rhythm, S1/S2 No murmur  Respiratory: clear to auscultation bilaterally - pulse ox 100% on 2 L of  oxygen  Abdomen: Soft, +Bowel sounds, non tender, non distended, no guarding  MSK: No  cyanosis or clubbing- pitting edema bilateral ankles right greater than left  Data Reviewed: Basic Metabolic Panel:  Recent Labs Lab 02/15/15 1644  NA 144  K 3.5  CL 104  CO2 29  GLUCOSE 83  BUN 13  CREATININE 0.71  CALCIUM 9.8   Liver Function Tests:  Recent Labs Lab 02/15/15 1644  AST 29  ALT 16  ALKPHOS 77  BILITOT 0.6  PROT 9.1*  ALBUMIN 4.3   No results for input(s): LIPASE, AMYLASE in the last 168 hours. No results for input(s): AMMONIA in the last 168 hours. CBC:  Recent Labs Lab 02/15/15 1644  WBC 11.2*  NEUTROABS 7.7  HGB 15.6*  HCT 47.8*  MCV 81.4  PLT 187   Cardiac Enzymes:  Recent Labs Lab 02/15/15 1644 02/15/15 2245 02/16/15 0300 02/16/15 0935  CKTOTAL 347*  --  281*  --   TROPONINI 0.28* 0.25* 0.25* 0.26*   BNP (last 3 results) No results for input(s): BNP in the last 8760 hours.  ProBNP (last 3 results) No results for input(s): PROBNP in the last 8760 hours.  CBG: No results for input(s): GLUCAP in the last 168 hours.  No results found for this or any previous visit (from the past 240 hour(s)).   Studies: Dg Chest Port 1 View  02/15/2015   CLINICAL DATA:  Hypertension ; headache with blurred vision  EXAM: PORTABLE CHEST - 1 VIEW  COMPARISON:  October 07, 2014  FINDINGS: There is no edema or consolidation. Heart is mildly enlarged with pulmonary vascularity within normal limits. No adenopathy. There is atherosclerotic change in the aortic arch region. There is thoracolumbar levoscoliosis. No adenopathy.  IMPRESSION: Mild cardiac enlargement, stable.  No edema or consolidation.   Electronically Signed   By: Bretta Bang III M.D.   On: 02/15/2015 17:22    Scheduled Meds:  Scheduled Meds: . [START ON 02/17/2015] amLODipine  10 mg Oral Daily  . aspirin EC  81 mg Oral Daily  . atorvastatin  20 mg Oral q1800  . doxazosin  4 mg Oral QHS  .  enoxaparin (LOVENOX) injection  55 mg Subcutaneous Q24H  . hydrALAZINE  50 mg Oral TID  . hydrochlorothiazide  25 mg Oral Daily  . isosorbide mononitrate  30 mg Oral Daily  . [START ON 02/17/2015] losartan  100 mg Oral Daily   Continuous Infusions:   Time spent on care of this patient: 35 min   Trek Kimball, MD 02/16/2015, 2:44 PM  LOS: 1 day   [facility] Office  [phone] Pager - Text Page per [email or web address] If 7PM-7AM, please contact night-coverage [email or web address]

## 2015-02-16 NOTE — Progress Notes (Addendum)
This CM spoke with pt about issues with obtaining medications and no PCP. Pt states that her assigned PCP has gone out of practice. Pt instructed to call the number on the back of her medicaid card to receive name of new assigned PCP and to get clarification on medication issues. Pt states she will have her daughter call Medicaid today. Pt also instructed that if she has difficulty getting PCP and medications to make an appointment at the [facility]. Phone number and address placed on AVS for pt.  MD ordered rolling walker for pt. [facility] DME called for referral.  Sandford Craze RN,BSN,NCM [phone]

## 2015-02-16 NOTE — Progress Notes (Signed)
Tele notified this nurse HR dropped down to 27. Pt sleeping, aroused, no c/o chest pain. Vs 191/82, 72, 22, 96% on 2L [place]. Will continue to monitor.

## 2015-02-16 NOTE — Progress Notes (Signed)
Utilization review completed.  

## 2015-02-17 ENCOUNTER — Inpatient Hospital Stay (HOSPITAL_COMMUNITY)
Admission: EM | Admit: 2015-02-17 | Discharge: 2015-02-22 | DRG: 556 | Disposition: A | Discharge status: Skilled Nursing Facility | Payer: Medicaid Other | Attending: Internal Medicine | Admitting: Internal Medicine

## 2015-02-17 ENCOUNTER — Emergency Department (HOSPITAL_COMMUNITY): Payer: Medicaid Other

## 2015-02-17 ENCOUNTER — Encounter (HOSPITAL_COMMUNITY): Payer: Self-pay | Admitting: Emergency Medicine

## 2015-02-17 DIAGNOSIS — R079 Chest pain, unspecified: Secondary | ICD-10-CM | POA: Diagnosis present

## 2015-02-17 DIAGNOSIS — J45909 Unspecified asthma, uncomplicated: Secondary | ICD-10-CM | POA: Diagnosis present

## 2015-02-17 DIAGNOSIS — N179 Acute kidney failure, unspecified: Secondary | ICD-10-CM | POA: Diagnosis present

## 2015-02-17 DIAGNOSIS — I313 Pericardial effusion (noninflammatory): Secondary | ICD-10-CM | POA: Diagnosis present

## 2015-02-17 DIAGNOSIS — Z8249 Family history of ischemic heart disease and other diseases of the circulatory system: Secondary | ICD-10-CM

## 2015-02-17 DIAGNOSIS — Z79899 Other long term (current) drug therapy: Secondary | ICD-10-CM

## 2015-02-17 DIAGNOSIS — Z7982 Long term (current) use of aspirin: Secondary | ICD-10-CM

## 2015-02-17 DIAGNOSIS — I493 Ventricular premature depolarization: Secondary | ICD-10-CM | POA: Diagnosis present

## 2015-02-17 DIAGNOSIS — J449 Chronic obstructive pulmonary disease, unspecified: Secondary | ICD-10-CM | POA: Diagnosis present

## 2015-02-17 DIAGNOSIS — D696 Thrombocytopenia, unspecified: Secondary | ICD-10-CM | POA: Diagnosis present

## 2015-02-17 DIAGNOSIS — J9610 Chronic respiratory failure, unspecified whether with hypoxia or hypercapnia: Secondary | ICD-10-CM | POA: Diagnosis present

## 2015-02-17 DIAGNOSIS — I252 Old myocardial infarction: Secondary | ICD-10-CM

## 2015-02-17 DIAGNOSIS — Z833 Family history of diabetes mellitus: Secondary | ICD-10-CM

## 2015-02-17 DIAGNOSIS — Z825 Family history of asthma and other chronic lower respiratory diseases: Secondary | ICD-10-CM

## 2015-02-17 DIAGNOSIS — R7989 Other specified abnormal findings of blood chemistry: Secondary | ICD-10-CM

## 2015-02-17 DIAGNOSIS — E785 Hyperlipidemia, unspecified: Secondary | ICD-10-CM | POA: Diagnosis present

## 2015-02-17 DIAGNOSIS — M79605 Pain in left leg: Secondary | ICD-10-CM

## 2015-02-17 DIAGNOSIS — M1990 Unspecified osteoarthritis, unspecified site: Secondary | ICD-10-CM | POA: Diagnosis present

## 2015-02-17 DIAGNOSIS — M25562 Pain in left knee: Secondary | ICD-10-CM

## 2015-02-17 DIAGNOSIS — I2510 Atherosclerotic heart disease of native coronary artery without angina pectoris: Secondary | ICD-10-CM | POA: Diagnosis present

## 2015-02-17 DIAGNOSIS — I2699 Other pulmonary embolism without acute cor pulmonale: Secondary | ICD-10-CM

## 2015-02-17 DIAGNOSIS — Z993 Dependence on wheelchair: Secondary | ICD-10-CM

## 2015-02-17 DIAGNOSIS — R778 Other specified abnormalities of plasma proteins: Secondary | ICD-10-CM | POA: Diagnosis present

## 2015-02-17 DIAGNOSIS — I639 Cerebral infarction, unspecified: Secondary | ICD-10-CM | POA: Diagnosis present

## 2015-02-17 DIAGNOSIS — K5730 Diverticulosis of large intestine without perforation or abscess without bleeding: Secondary | ICD-10-CM | POA: Diagnosis present

## 2015-02-17 DIAGNOSIS — I25119 Atherosclerotic heart disease of native coronary artery with unspecified angina pectoris: Secondary | ICD-10-CM | POA: Diagnosis present

## 2015-02-17 DIAGNOSIS — Z9981 Dependence on supplemental oxygen: Secondary | ICD-10-CM

## 2015-02-17 DIAGNOSIS — I429 Cardiomyopathy, unspecified: Secondary | ICD-10-CM | POA: Diagnosis present

## 2015-02-17 DIAGNOSIS — Z8673 Personal history of transient ischemic attack (TIA), and cerebral infarction without residual deficits: Secondary | ICD-10-CM

## 2015-02-17 DIAGNOSIS — K219 Gastro-esophageal reflux disease without esophagitis: Secondary | ICD-10-CM | POA: Diagnosis present

## 2015-02-17 DIAGNOSIS — R609 Edema, unspecified: Secondary | ICD-10-CM | POA: Diagnosis present

## 2015-02-17 DIAGNOSIS — I517 Cardiomegaly: Secondary | ICD-10-CM

## 2015-02-17 DIAGNOSIS — Z955 Presence of coronary angioplasty implant and graft: Secondary | ICD-10-CM

## 2015-02-17 DIAGNOSIS — Z880 Allergy status to penicillin: Secondary | ICD-10-CM

## 2015-02-17 DIAGNOSIS — F17210 Nicotine dependence, cigarettes, uncomplicated: Secondary | ICD-10-CM | POA: Diagnosis present

## 2015-02-17 DIAGNOSIS — M79606 Pain in leg, unspecified: Principal | ICD-10-CM | POA: Diagnosis present

## 2015-02-17 DIAGNOSIS — Z823 Family history of stroke: Secondary | ICD-10-CM

## 2015-02-17 DIAGNOSIS — M79604 Pain in right leg: Secondary | ICD-10-CM

## 2015-02-17 DIAGNOSIS — Z9114 Patient's other noncompliance with medication regimen: Secondary | ICD-10-CM | POA: Diagnosis present

## 2015-02-17 DIAGNOSIS — R011 Cardiac murmur, unspecified: Secondary | ICD-10-CM | POA: Diagnosis present

## 2015-02-17 DIAGNOSIS — I10 Essential (primary) hypertension: Secondary | ICD-10-CM | POA: Diagnosis present

## 2015-02-17 DIAGNOSIS — Z6841 Body Mass Index (BMI) 40.0 and over, adult: Secondary | ICD-10-CM

## 2015-02-17 DIAGNOSIS — E860 Dehydration: Secondary | ICD-10-CM | POA: Diagnosis present

## 2015-02-17 DIAGNOSIS — I248 Other forms of acute ischemic heart disease: Secondary | ICD-10-CM | POA: Diagnosis present

## 2015-02-17 DIAGNOSIS — J9611 Chronic respiratory failure with hypoxia: Secondary | ICD-10-CM

## 2015-02-17 DIAGNOSIS — F17220 Nicotine dependence, chewing tobacco, uncomplicated: Secondary | ICD-10-CM | POA: Diagnosis present

## 2015-02-17 LAB — CBC WITH DIFFERENTIAL/PLATELET
Basophils Absolute: 0 10*3/uL (ref 0.0–0.1)
Basophils Relative: 0 % (ref 0–1)
EOS PCT: 1 % (ref 0–5)
Eosinophils Absolute: 0.1 10*3/uL (ref 0.0–0.7)
HEMATOCRIT: 36.2 % (ref 36.0–46.0)
Hemoglobin: 11.3 g/dL — ABNORMAL LOW (ref 12.0–15.0)
LYMPHS ABS: 2.1 10*3/uL (ref 0.7–4.0)
LYMPHS PCT: 21 % (ref 12–46)
MCH: 25.6 pg — AB (ref 26.0–34.0)
MCHC: 31.2 g/dL (ref 30.0–36.0)
MCV: 81.9 fL (ref 78.0–100.0)
MONO ABS: 0.5 10*3/uL (ref 0.1–1.0)
MONOS PCT: 6 % (ref 3–12)
NEUTROS ABS: 7 10*3/uL (ref 1.7–7.7)
Neutrophils Relative %: 73 % (ref 43–77)
PLATELETS: 178 10*3/uL (ref 150–400)
RBC: 4.42 MIL/uL (ref 3.87–5.11)
RDW: 16.7 % — AB (ref 11.5–15.5)
WBC: 9.7 10*3/uL (ref 4.0–10.5)

## 2015-02-17 LAB — BASIC METABOLIC PANEL
Anion gap: 9 (ref 5–15)
BUN: 30 mg/dL — AB (ref 6–20)
CALCIUM: 8.9 mg/dL (ref 8.9–10.3)
CO2: 25 mmol/L (ref 22–32)
Chloride: 108 mmol/L (ref 101–111)
Creatinine, Ser: 1.73 mg/dL — ABNORMAL HIGH (ref 0.44–1.00)
GFR calc Af Amer: 36 mL/min — ABNORMAL LOW (ref >60)
GFR, EST NON AFRICAN AMERICAN: 31 mL/min — AB (ref >60)
GLUCOSE: 126 mg/dL — AB (ref 65–99)
Potassium: 3.6 mmol/L (ref 3.5–5.1)
Sodium: 142 mmol/L (ref 135–145)

## 2015-02-17 LAB — URINE CULTURE

## 2015-02-17 LAB — D-DIMER, QUANTITATIVE (NOT AT ARMC): D DIMER QUANT: 1.49 ug{FEU}/mL — AB (ref 0.00–0.48)

## 2015-02-17 LAB — TROPONIN I: Troponin I: 0.23 ng/mL — ABNORMAL HIGH (ref <0.031)

## 2015-02-17 MED ORDER — HYDROCHLOROTHIAZIDE 25 MG PO TABS: 25.0000 mg | ORAL_TABLET | Freq: Every day | ORAL | Status: DC

## 2015-02-17 MED ORDER — LOSARTAN POTASSIUM 50 MG PO TABS: 100.0000 mg | ORAL_TABLET | Freq: Every day | ORAL | Status: DC

## 2015-02-17 MED ORDER — SODIUM CHLORIDE 0.9 % IV SOLN: 1000.0000 mL | INTRAVENOUS | Status: DC

## 2015-02-17 MED ORDER — IBUPROFEN 800 MG PO TABS
800.0000 mg | ORAL_TABLET | Freq: Once | ORAL | Status: AC
  Administered 2015-02-17: 800 mg via ORAL
  Filled 2015-02-17: qty 1

## 2015-02-17 MED ORDER — ATORVASTATIN CALCIUM 20 MG PO TABS: 20.0000 mg | ORAL_TABLET | Freq: Every day | ORAL | Status: AC

## 2015-02-17 MED ORDER — AMLODIPINE BESYLATE 5 MG PO TABS: 10.0000 mg | ORAL_TABLET | Freq: Every day | ORAL | Status: DC

## 2015-02-17 MED ORDER — DOXAZOSIN MESYLATE 4 MG PO TABS: 4.0000 mg | ORAL_TABLET | Freq: Every day | ORAL | Status: DC

## 2015-02-17 MED ORDER — ISOSORBIDE MONONITRATE ER 30 MG PO TB24: 30.0000 mg | ORAL_TABLET | Freq: Every day | ORAL | Status: DC

## 2015-02-17 MED ORDER — FAMOTIDINE 20 MG PO TABS: ORAL_TABLET | ORAL | Status: DC

## 2015-02-17 MED ORDER — ACETAMINOPHEN 325 MG PO TABS
650.0000 mg | ORAL_TABLET | Freq: Once | ORAL | Status: AC
Start: 2015-02-17 — End: 2015-02-17
  Administered 2015-02-17: 650 mg via ORAL
  Filled 2015-02-17: qty 2

## 2015-02-17 MED ORDER — HYDRALAZINE HCL 50 MG PO TABS: 50.0000 mg | ORAL_TABLET | Freq: Three times a day (TID) | ORAL | Status: DC

## 2015-02-17 MED ORDER — SODIUM CHLORIDE 0.9 % IV SOLN: 1000.0000 mL | Freq: Once | INTRAVENOUS | Status: DC

## 2015-02-17 MED ORDER — NITROGLYCERIN 0.4 MG SL SUBL
0.4000 mg | SUBLINGUAL_TABLET | SUBLINGUAL | Status: DC | PRN
  Administered 2015-02-19 (×3): 0.4 mg via SUBLINGUAL
  Filled 2015-02-17: qty 1

## 2015-02-17 NOTE — Progress Notes (Signed)
Patient discharged.  Leaving with daughter [name].  Patient noncompliant in using oxygen.  Patient got up to chair and returned back to bed within an hour.  Rolling walker brought to room for patient to take home for use.  Patient daughter reports patient will not use the rolling walker.  Patient and daughter educated about going home or going to assisted living.  Patient adamant about not going to assisted living or nursing home because patient states "they will take everything I have."  Patient morbidly obese and it takes two persons to get her to wheelchair and into vehicle at discharge.  Patient daughter and patient educated about picking up prescriptions.  Reported understanding.  Patient left at approximately 1413.

## 2015-02-17 NOTE — Care Management Note (Signed)
Case Management Note  Patient Details  Name: [name] MRN: [id number] Date of Birth: [birth year]  Subjective/Objective:         HTN            Action/Plan: Home   Expected Discharge Date:  02/17/2015               Expected Discharge Plan:  Home/Self Care   Discharge planning Services  CM Consult       DME Arranged:  Dan Humphreys rolling DME Agency:  [facility]   Status of Service:  Completed, signed off  Medicare Important Message Given:    Date Medicare IM Given:    Medicare IM give by:    Date Additional Medicare IM Given:    Additional Medicare Important Message give by:     If discussed at Long Length of Stay Meetings, dates discussed:    Additional Comments: Contacted [facility] for RW for home.  Please see previous NCM notes.  Elliot Cousin, RN 02/17/2015, 12:26 PM

## 2015-02-17 NOTE — Discharge Summary (Addendum)
Physician Discharge Summary  [name] WJX:[id number] DOB: Jan 30, 1956 DOA: 02/15/2015  PCP: No primary care provider on file.  Admit date: 02/15/2015 Discharge date: 02/17/2015  Time spent: 55 minutes  Recommendations for Outpatient Follow-up:  1. Needs to call medicaid and have PCP assigned   Discharge Condition: stable    Discharge Diagnoses:  Principal Problem:   Hypertensive urgency Active Problems:   Coronary artery disease   Hypertensive heart disease   HLD (hyperlipidemia)   Weakness   Chronic respiratory failure- on home O2   History of present illness:  [name] is a 59 y.o. female with morbid obesity, hypertension with hypertensive heart disease, tobacco abuse, hyperlipidemia, CVA. The patient got stuck in her tub and therefore EMS was called. They noted her blood pressure was elevated and brought her into the hospital. The patient states that she has not had medications and close to 2 months. Her PCP has closed his practice. It appears per records in Epic her medications were last filled in July by the cardiology group. She states that for some reason Medicaid will not pay for her medications. She is having difficulty telling me which medications she is unable to obtain as she cannot remember which medications she is on.  Hospital Course:  Principal Problem:  Hypertensive urgency- severe LVH/diastolic dysfunction -Systolic blood pressure still close to 190 -has not gotten medications refilled-She has Medicaid which would pay for her medications and therefore I'm concerned this may just be a compliance issue - admits to non-compliance with low sodium diet -Continue Norvasc, Cozaar,  Doxazosin,  hydralazine and HCTZ - BP improved- see below  Active Problems: Sinus bradycardia -A few episodes of sinus bradycardia through the night on 9/7-9/7 - possibly due to Lopressor or possibly sleep apnea- she has never been tested for it but has the body habitus for  it -have discontinued Lopressor that was started the evening prior - no further episodes since this change  Elevated troponin -No chest pain - Troponins remaining stable at 0.2- last admitted in the spring troponins were about the same and therefore she may have a chronic mild elevation  Chronic respiratory failure -Chronically on oxygen-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 I have given her 3 refills on her meds.    Discharge Exam: Filed Weights   02/15/15 2059  Weight: 119.024 kg (262 lb 6.4 oz)   Filed Vitals:   02/17/15 1016  BP: 162/59  Pulse: 72  Temp: 97.8  Resp: 20    General: AAO x 3, no distress Cardiovascular: RRR, no murmurs  Respiratory: clear to auscultation bilaterally GI: soft, non-tender, non-distended, bowel sound positive  Discharge Instructions You were cared for by a hospitalist during your hospital stay. If you have any questions about your discharge medications or the care you received while you were in the hospital after you are discharged, you can call the unit and asked to speak with the hospitalist on call if the hospitalist that took care of you  is not available. Once you are discharged, your primary care physician will handle any further medical issues. Please note that NO REFILLS for any discharge medications will be authorized once you are discharged, as it is imperative that you return to your primary care physician (or establish a  relationship with a primary care physician if you do not have one) for your aftercare needs so that they can reassess your need for medications and monitor your lab values.      Discharge Instructions    Diet - low sodium heart healthy    Complete by:  As directed      Discharge instructions    Complete by:  As directed   Weigh yourself daily and try to lose weight.     Increase activity slowly    Complete by:  As directed             Medication List    STOP taking these medications        pantoprazole 40 MG tablet  Commonly known as:  PROTONIX      TAKE these medications        albuterol 108 (90 BASE) MCG/ACT inhaler  Commonly known as:  PROVENTIL HFA;VENTOLIN HFA  1 to 2 puffs every 4 to 6 hours as needed     amLODipine 5 MG tablet  Commonly known as:  NORVASC  Take 2 tablets (10 mg total) by mouth daily.     aspirin 81 MG EC tablet  Take 1 tablet (81 mg total) by mouth daily.     atorvastatin 20 MG tablet  Commonly known as:  LIPITOR  Take 1 tablet (20 mg total) by mouth daily at 6 PM.     doxazosin 4 MG tablet  Commonly known as:  CARDURA  Take 1 tablet (4 mg total) by mouth at bedtime.     famotidine 20 MG tablet  Commonly known as:  PEPCID  One at bedtime     hydrALAZINE 50 MG tablet  Commonly known as:  APRESOLINE  Take 1 tablet (50 mg total) by mouth 3 (three) times daily.     hydrochlorothiazide 25 MG tablet  Commonly known as:  HYDRODIURIL  Take 1 tablet (25 mg total) by mouth daily.     isosorbide mononitrate 30 MG 24 hr tablet  Commonly known as:  IMDUR  Take 1 tablet (30 mg total) by mouth daily.     losartan 50 MG tablet  Commonly known as:  COZAAR  Take 2 tablets (100 mg total) by mouth daily.       Allergies  Allergen Reactions  . Penicillins Itching  . Penicillins     Made ' her stomach sick';   Follow-up Information    Call [facility]    .   Why:  If needed for Primary care physician and help  with medications.   Contact information:   [address] [place] [address] [phone]       The results of significant diagnostics from this hospitalization (including imaging, microbiology, ancillary and laboratory) are listed below for reference.    Significant Diagnostic Studies: Dg Chest Port 1 View  02/15/2015   CLINICAL DATA:  Hypertension ; headache with blurred vision  EXAM: PORTABLE CHEST - 1 VIEW  COMPARISON:  October 07, 2014  FINDINGS: There is no edema or consolidation. Heart is mildly enlarged with pulmonary vascularity within normal limits. No adenopathy. There is atherosclerotic change in the aortic  arch region. There is thoracolumbar levoscoliosis. No adenopathy.  IMPRESSION: Mild cardiac enlargement, stable.  No edema or consolidation.   Electronically Signed   By: Bretta Bang III M.D.   On: 02/15/2015 17:22    Microbiology: Recent Results (from the past 240 hour(s))  Urine culture     Status: None (Preliminary result)   Collection Time: 02/15/15  4:15 PM  Result Value Ref Range Status   Specimen Description URINE, CLEAN CATCH  Final   Special Requests NONE  Final   Culture   Final    CULTURE REINCUBATED FOR BETTER GROWTH Performed at [facility]    Report Status PENDING  Incomplete     Labs: Basic Metabolic Panel:  Recent Labs Lab 02/15/15 1644  NA 144  K 3.5  CL 104  CO2 29  GLUCOSE 83  BUN 13  CREATININE 0.71  CALCIUM 9.8   Liver Function Tests:  Recent Labs Lab 02/15/15 1644  AST 29  ALT 16  ALKPHOS 77  BILITOT 0.6  PROT 9.1*  ALBUMIN 4.3   No results for input(s): LIPASE, AMYLASE in the last 168 hours. No results for input(s): AMMONIA in the last 168 hours. CBC:  Recent Labs Lab 02/15/15 1644  WBC 11.2*  NEUTROABS 7.7  HGB 15.6*  HCT 47.8*  MCV 81.4  PLT 187   Cardiac Enzymes:  Recent Labs Lab 02/15/15 1644 02/15/15 2245 02/16/15 0300 02/16/15 0935  CKTOTAL 347*  --  281*  --    TROPONINI 0.28* 0.25* 0.25* 0.26*   BNP: BNP (last 3 results) No results for input(s): BNP in the last 8760 hours.  ProBNP (last 3 results) No results for input(s): PROBNP in the last 8760 hours.  CBG: No results for input(s): GLUCAP in the last 168 hours.     SignedCalvert Cantor, MD [facility] 02/17/2015, 10:45 AM

## 2015-02-17 NOTE — ED Notes (Signed)
Per EMS: pt c/o bilateral leg pain, pt was d/c this morning from hospital. Pt also c/o generalized weakness.

## 2015-02-17 NOTE — ED Provider Notes (Signed)
CSN: [id number]     Arrival date & time 02/17/15  1747 History   First MD Initiated Contact with Patient 02/17/15 1757     Chief Complaint  Patient presents with  . Leg Pain    bilateral     (Consider location/radiation/quality/duration/timing/severity/associated sxs/prior Treatment) HPI [name] is a 59 y.o. female with PMH significant for CAD, nonischemic cardiomyopathy, HTN, stroke who was discharged today from the hospital after being treated for hypertensive urgency. Active problems upon discharge include elevated troponins (0.2) without chest pain and chronic respiratory failure. She presents today complaining of feeling like she is unable to breathe, chest pain, and bilateral leg pain. She states that the shortness of breath and leg pain began when she got home from the hospital this evening. She has not tried anything to alleviate her symptoms. She states she is unable to ambulate. She is normally on 2 L of oxygen at home. She states the chest pain began while sitting in ED. Denies fevers, chills, nausea, vomiting, urinary symptoms.  Past Medical History  Diagnosis Date  . Arthritis   . Stroke   . Coronary artery disease     a. remote hx of cath ~2000. B. cath  moderate disease in an RPL branch - too distal for PCI; 3rd RPLB lesion, 50% stenosed; hyperdynamic LV with near cavity obliteration & severely elevated LVEDP  . Asthma   . 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 Non-ischemic cardiomyopathy     cxr - mild cardiomegly, Echo 10/07/14 - severe LVH with LVEF 65-70%.  . Hypertension   . Heart attack   . Chronic bronchitis   . Heart murmur   . Wheelchair bound   . Bilateral chronic knee pain   . Accelerated hypertension    Past Surgical History  Procedure Laterality Date  . Abdominal hysterectomy    . I&d extremity  08/18/2011    Procedure: IRRIGATION AND DEBRIDEMENT EXTREMITY;  Surgeon: Sharma Covert, MD;   Location: [facility] OR;  Service: Orthopedics;  Laterality: Right;  I&D Right Long Finger  . Amputation  08/21/2011    Procedure: AMPUTATION DIGIT;  Surgeon: Sharma Covert, MD;  Location: [facility] OR;  Service: Orthopedics;  Laterality: Right;  . Flexible sigmoidoscopy  04/28/2012    Procedure: FLEXIBLE SIGMOIDOSCOPY;  Surgeon: Petra Kuba, MD;  Location: [facility] ENDOSCOPY;  Service: Endoscopy;  Laterality: N/A;  unsedated, unprepped  . Cardiac catheterization N/A 10/09/2014    Procedure: Left Heart Cath and Coronary Angiography;  Surgeon: Marykay Lex, MD;  Location: [facility] INVASIVE CV LAB CUPID;  Service: Cardiovascular;  Laterality: N/A;  . Coronary stent placement    . Cardiac catheterization  10/2014    normal coronary arteries aside for a distal RPLB branch with 50% stenosis   Family History  Problem Relation Age of Onset  . Diabetes type I Mother   . Hypertension Mother   . Arthritis Mother   . Stroke Sister   . Diabetes type I Brother   . Hypertension Brother   . Healthy Brother   . Diabetes type II Brother   . Heart disease Brother   . Healthy Brother   . Healthy Brother   . Diabetes type I Sister   . Hypertension Sister   . Asthma Sister   . Bronchitis Sister    Social History  Substance Use Topics  . Smoking status: Current Every Day Smoker -- 0.50 packs/day  for 20 years    Types: Cigarettes  . Smokeless tobacco: Former Neurosurgeon    Types: Snuff  . Alcohol Use: 3.6 oz/week    6 Cans of beer per week     Comment: 04/27/12- "amount varies"   OB History    Gravida Para Term Preterm AB TAB SAB Ectopic Multiple Living   0 0 0 0 0 0 0 0       Review of Systems  All other systems negative unless otherwise stated in HPI   Allergies  Penicillins  Home Medications   Prior to Admission medications   Medication Sig Start Date End Date Taking? Authorizing Provider  albuterol (PROVENTIL HFA;VENTOLIN HFA) 108 (90 BASE) MCG/ACT inhaler 1 to 2 puffs every 4 to 6 hours as needed Patient  taking differently: Inhale 1-2 puffs into the lungs every 4 (four) hours as needed for wheezing or shortness of breath.  12/26/14  Yes Scott T Alben Spittle, PA-C  amLODipine (NORVASC) 5 MG tablet Take 2 tablets (10 mg total) by mouth daily. 02/17/15  Yes Calvert Cantor, MD  aspirin EC 81 MG EC tablet Take 1 tablet (81 mg total) by mouth daily. 10/10/14  Yes Bhavinkumar Bhagat, PA  atorvastatin (LIPITOR) 20 MG tablet Take 1 tablet (20 mg total) by mouth daily at 6 PM. 02/17/15  Yes Calvert Cantor, MD  doxazosin (CARDURA) 4 MG tablet Take 1 tablet (4 mg total) by mouth at bedtime. 02/17/15  Yes Calvert Cantor, MD  famotidine (PEPCID) 20 MG tablet One at bedtime Patient taking differently: Take 20 mg by mouth at bedtime.  02/17/15  Yes Calvert Cantor, MD  hydrALAZINE (APRESOLINE) 50 MG tablet Take 1 tablet (50 mg total) by mouth 3 (three) times daily. 02/17/15  Yes Calvert Cantor, MD  hydrochlorothiazide (HYDRODIURIL) 25 MG tablet Take 1 tablet (25 mg total) by mouth daily. 02/17/15  Yes Calvert Cantor, MD  isosorbide mononitrate (IMDUR) 30 MG 24 hr tablet Take 1 tablet (30 mg total) by mouth daily. 02/17/15  Yes Calvert Cantor, MD  losartan (COZAAR) 50 MG tablet Take 2 tablets (100 mg total) by mouth daily. 02/17/15  Yes Calvert Cantor, MD   There were no vitals taken for this visit. Physical Exam  Constitutional: She is oriented to person, place, and time. She appears well-developed and well-nourished.  HENT:  Head: Atraumatic.  Eyes: Conjunctivae are normal. No scleral icterus.  Neck: Neck supple. No tracheal deviation present.  Cardiovascular: Normal rate and regular rhythm.   Murmur heard. Pulmonary/Chest: Effort normal and breath sounds normal. No respiratory distress.  Non-reproducible chest pain.  Abdominal: Soft. Bowel sounds are normal. She exhibits no distension. There is no tenderness.  Musculoskeletal: Normal range of motion.  FROM of the lower extremities.  TTP along anterior calf bilaterally. Distal pulses  intact bilaterally.  There is no edema.  No erythema or warmth.  Neurological: She is alert and oriented to person, place, and time.  Sensory and strength intact throughout lower extremities.  Skin: Skin is warm and dry.  Psychiatric: She has a normal mood and affect. Her behavior is normal.    ED Course  Procedures (including critical care time) Labs Review Labs Reviewed  D-DIMER, QUANTITATIVE (NOT AT [facility])  CBC WITH DIFFERENTIAL/PLATELET  BASIC METABOLIC PANEL  TROPONIN I    Imaging Review No results found. I have personally reviewed and evaluated these images and lab results as part of my medical decision-making.   EKG Interpretation None      MDM  Final diagnoses:  None    Patient presents with shortness of breath, CP, and bilateral leg pain.  VSS, non-hypoxic, patient appears distressed.  On exam, TTP on anterior calf bilaterally. Murmur appreciated.  Imaging shows no active cardiopulmonary disease on CXR. CTA unable to be performed given Cr of 1.74 and failed IV attempts.  Labs: d-dimer 1.49, troponin 0.23 (consistent with previous troponins of 0.2), Cr 1.73 elevated from previous 0.7 3d ago. BNP pending.  Suspect pulmonary embolism and acute renal failure.  Los suspicion for ACS.   Pt given nitro, tylenol, motrin in ED for pain. IVF bolus and maintenance.  Upon reevaluation, patient still complaining of leg pain.  Morphine given.  Suspect likely admission for acute renal failure and possible PE. Patient will need bilateral lower extremity doppler in AM.  Case has been discussed with and seen by Dr. Clarene Critchley who agrees with the above plan for likely admission.  Patient signed out to Dondra Spry, NP.      Cheri Fowler, PA-C 02/18/15 0138  Laurence Spates, MD 02/18/15 [phone]

## 2015-02-18 ENCOUNTER — Other Ambulatory Visit: Payer: Self-pay

## 2015-02-18 ENCOUNTER — Inpatient Hospital Stay (HOSPITAL_COMMUNITY): Payer: Medicaid Other

## 2015-02-18 ENCOUNTER — Observation Stay (HOSPITAL_COMMUNITY): Payer: Medicaid Other

## 2015-02-18 ENCOUNTER — Encounter (HOSPITAL_COMMUNITY): Payer: Self-pay

## 2015-02-18 DIAGNOSIS — Z833 Family history of diabetes mellitus: Secondary | ICD-10-CM | POA: Diagnosis not present

## 2015-02-18 DIAGNOSIS — K5730 Diverticulosis of large intestine without perforation or abscess without bleeding: Secondary | ICD-10-CM | POA: Diagnosis present

## 2015-02-18 DIAGNOSIS — Z955 Presence of coronary angioplasty implant and graft: Secondary | ICD-10-CM | POA: Diagnosis not present

## 2015-02-18 DIAGNOSIS — Z825 Family history of asthma and other chronic lower respiratory diseases: Secondary | ICD-10-CM | POA: Diagnosis not present

## 2015-02-18 DIAGNOSIS — Z9981 Dependence on supplemental oxygen: Secondary | ICD-10-CM | POA: Diagnosis not present

## 2015-02-18 DIAGNOSIS — Z8249 Family history of ischemic heart disease and other diseases of the circulatory system: Secondary | ICD-10-CM | POA: Diagnosis not present

## 2015-02-18 DIAGNOSIS — R7989 Other specified abnormal findings of blood chemistry: Secondary | ICD-10-CM | POA: Diagnosis present

## 2015-02-18 DIAGNOSIS — I10 Essential (primary) hypertension: Secondary | ICD-10-CM | POA: Diagnosis present

## 2015-02-18 DIAGNOSIS — N179 Acute kidney failure, unspecified: Secondary | ICD-10-CM | POA: Diagnosis present

## 2015-02-18 DIAGNOSIS — I252 Old myocardial infarction: Secondary | ICD-10-CM | POA: Diagnosis not present

## 2015-02-18 DIAGNOSIS — M79609 Pain in unspecified limb: Secondary | ICD-10-CM

## 2015-02-18 DIAGNOSIS — D696 Thrombocytopenia, unspecified: Secondary | ICD-10-CM | POA: Diagnosis present

## 2015-02-18 DIAGNOSIS — Z79899 Other long term (current) drug therapy: Secondary | ICD-10-CM | POA: Diagnosis not present

## 2015-02-18 DIAGNOSIS — E860 Dehydration: Secondary | ICD-10-CM | POA: Diagnosis present

## 2015-02-18 DIAGNOSIS — Z823 Family history of stroke: Secondary | ICD-10-CM | POA: Diagnosis not present

## 2015-02-18 DIAGNOSIS — I25119 Atherosclerotic heart disease of native coronary artery with unspecified angina pectoris: Secondary | ICD-10-CM | POA: Diagnosis present

## 2015-02-18 DIAGNOSIS — Z8673 Personal history of transient ischemic attack (TIA), and cerebral infarction without residual deficits: Secondary | ICD-10-CM | POA: Diagnosis not present

## 2015-02-18 DIAGNOSIS — I248 Other forms of acute ischemic heart disease: Secondary | ICD-10-CM | POA: Diagnosis present

## 2015-02-18 DIAGNOSIS — Z880 Allergy status to penicillin: Secondary | ICD-10-CM | POA: Diagnosis not present

## 2015-02-18 DIAGNOSIS — M25562 Pain in left knee: Secondary | ICD-10-CM

## 2015-02-18 DIAGNOSIS — J9610 Chronic respiratory failure, unspecified whether with hypoxia or hypercapnia: Secondary | ICD-10-CM | POA: Diagnosis present

## 2015-02-18 DIAGNOSIS — I313 Pericardial effusion (noninflammatory): Secondary | ICD-10-CM | POA: Diagnosis present

## 2015-02-18 DIAGNOSIS — M150 Primary generalized (osteo)arthritis: Secondary | ICD-10-CM

## 2015-02-18 DIAGNOSIS — M25569 Pain in unspecified knee: Secondary | ICD-10-CM | POA: Insufficient documentation

## 2015-02-18 DIAGNOSIS — Z993 Dependence on wheelchair: Secondary | ICD-10-CM | POA: Diagnosis not present

## 2015-02-18 DIAGNOSIS — J45909 Unspecified asthma, uncomplicated: Secondary | ICD-10-CM | POA: Diagnosis present

## 2015-02-18 DIAGNOSIS — F17220 Nicotine dependence, chewing tobacco, uncomplicated: Secondary | ICD-10-CM | POA: Diagnosis present

## 2015-02-18 DIAGNOSIS — M1990 Unspecified osteoarthritis, unspecified site: Secondary | ICD-10-CM | POA: Diagnosis present

## 2015-02-18 DIAGNOSIS — R609 Edema, unspecified: Secondary | ICD-10-CM | POA: Diagnosis present

## 2015-02-18 DIAGNOSIS — R778 Other specified abnormalities of plasma proteins: Secondary | ICD-10-CM | POA: Diagnosis present

## 2015-02-18 DIAGNOSIS — R079 Chest pain, unspecified: Secondary | ICD-10-CM | POA: Diagnosis not present

## 2015-02-18 DIAGNOSIS — K219 Gastro-esophageal reflux disease without esophagitis: Secondary | ICD-10-CM | POA: Diagnosis present

## 2015-02-18 DIAGNOSIS — M79606 Pain in leg, unspecified: Secondary | ICD-10-CM | POA: Diagnosis present

## 2015-02-18 DIAGNOSIS — I493 Ventricular premature depolarization: Secondary | ICD-10-CM | POA: Diagnosis present

## 2015-02-18 DIAGNOSIS — R011 Cardiac murmur, unspecified: Secondary | ICD-10-CM | POA: Diagnosis present

## 2015-02-18 DIAGNOSIS — Z7982 Long term (current) use of aspirin: Secondary | ICD-10-CM | POA: Diagnosis not present

## 2015-02-18 DIAGNOSIS — I639 Cerebral infarction, unspecified: Secondary | ICD-10-CM

## 2015-02-18 DIAGNOSIS — J449 Chronic obstructive pulmonary disease, unspecified: Secondary | ICD-10-CM | POA: Diagnosis present

## 2015-02-18 DIAGNOSIS — E785 Hyperlipidemia, unspecified: Secondary | ICD-10-CM | POA: Diagnosis present

## 2015-02-18 DIAGNOSIS — F17210 Nicotine dependence, cigarettes, uncomplicated: Secondary | ICD-10-CM | POA: Diagnosis present

## 2015-02-18 DIAGNOSIS — M79605 Pain in left leg: Secondary | ICD-10-CM | POA: Diagnosis present

## 2015-02-18 DIAGNOSIS — I429 Cardiomyopathy, unspecified: Secondary | ICD-10-CM | POA: Diagnosis present

## 2015-02-18 DIAGNOSIS — Z9114 Patient's other noncompliance with medication regimen: Secondary | ICD-10-CM | POA: Diagnosis present

## 2015-02-18 DIAGNOSIS — Z6841 Body Mass Index (BMI) 40.0 and over, adult: Secondary | ICD-10-CM | POA: Diagnosis not present

## 2015-02-18 LAB — COMPREHENSIVE METABOLIC PANEL
ALBUMIN: 3.5 g/dL (ref 3.5–5.0)
ALT: 15 U/L (ref 14–54)
ANION GAP: 8 (ref 5–15)
AST: 20 U/L (ref 15–41)
Alkaline Phosphatase: 61 U/L (ref 38–126)
BILIRUBIN TOTAL: 0.3 mg/dL (ref 0.3–1.2)
BUN: 25 mg/dL — ABNORMAL HIGH (ref 6–20)
CO2: 24 mmol/L (ref 22–32)
Calcium: 8.4 mg/dL — ABNORMAL LOW (ref 8.9–10.3)
Chloride: 111 mmol/L (ref 101–111)
Creatinine, Ser: 1.1 mg/dL — ABNORMAL HIGH (ref 0.44–1.00)
GFR calc non Af Amer: 53 mL/min — ABNORMAL LOW (ref >60)
GLUCOSE: 101 mg/dL — AB (ref 65–99)
POTASSIUM: 3.5 mmol/L (ref 3.5–5.1)
SODIUM: 143 mmol/L (ref 135–145)
TOTAL PROTEIN: 6.6 g/dL (ref 6.5–8.1)

## 2015-02-18 LAB — CBC
HCT: 34.8 % — ABNORMAL LOW (ref 36.0–46.0)
Hemoglobin: 11.1 g/dL — ABNORMAL LOW (ref 12.0–15.0)
MCH: 26.4 pg (ref 26.0–34.0)
MCHC: 31.9 g/dL (ref 30.0–36.0)
MCV: 82.7 fL (ref 78.0–100.0)
Platelets: 163 10*3/uL (ref 150–400)
RBC: 4.21 MIL/uL (ref 3.87–5.11)
RDW: 16.8 % — ABNORMAL HIGH (ref 11.5–15.5)
WBC: 9.3 10*3/uL (ref 4.0–10.5)

## 2015-02-18 LAB — TROPONIN I
TROPONIN I: 0.23 ng/mL — AB (ref <0.031)
Troponin I: 0.23 ng/mL — ABNORMAL HIGH (ref <0.031)
Troponin I: 0.22 ng/mL — ABNORMAL HIGH

## 2015-02-18 LAB — CREATININE, URINE, RANDOM: CREATININE, URINE: 92.06 mg/dL

## 2015-02-18 LAB — PROTIME-INR
INR: 0.98 (ref 0.00–1.49)
Prothrombin Time: 13.2 s (ref 11.6–15.2)

## 2015-02-18 LAB — GLUCOSE, CAPILLARY: GLUCOSE-CAPILLARY: 92 mg/dL (ref 65–99)

## 2015-02-18 LAB — APTT: aPTT: 25 s (ref 24–37)

## 2015-02-18 LAB — BRAIN NATRIURETIC PEPTIDE: B Natriuretic Peptide: 190.3 pg/mL — ABNORMAL HIGH (ref 0.0–100.0)

## 2015-02-18 MED ORDER — ALBUTEROL SULFATE (2.5 MG/3ML) 0.083% IN NEBU: 2.5000 mg | INHALATION_SOLUTION | RESPIRATORY_TRACT | Status: DC | PRN

## 2015-02-18 MED ORDER — ISOSORBIDE MONONITRATE ER 30 MG PO TB24
30.0000 mg | ORAL_TABLET | Freq: Every day | ORAL | Status: DC
  Administered 2015-02-18 – 2015-02-19 (×2): 30 mg via ORAL
  Filled 2015-02-18 (×3): qty 1

## 2015-02-18 MED ORDER — ALUM & MAG HYDROXIDE-SIMETH 200-200-20 MG/5ML PO SUSP: 30.0000 mL | Freq: Four times a day (QID) | ORAL | Status: DC | PRN

## 2015-02-18 MED ORDER — SODIUM CHLORIDE 0.9 % IV SOLN
1000.0000 mL | INTRAVENOUS | Status: DC
  Administered 2015-02-18: 1000 mL via INTRAVENOUS

## 2015-02-18 MED ORDER — ALBUTEROL SULFATE HFA 108 (90 BASE) MCG/ACT IN AERS: 1.0000 | INHALATION_SPRAY | RESPIRATORY_TRACT | Status: DC | PRN

## 2015-02-18 MED ORDER — LOSARTAN POTASSIUM 50 MG PO TABS
100.0000 mg | ORAL_TABLET | Freq: Every day | ORAL | Status: DC
  Filled 2015-02-18: qty 2

## 2015-02-18 MED ORDER — HEPARIN (PORCINE) IN NACL 100-0.45 UNIT/ML-% IJ SOLN
1400.0000 [IU]/h | INTRAMUSCULAR | Status: DC
  Administered 2015-02-18: 1400 [IU]/h via INTRAVENOUS
  Filled 2015-02-18: qty 250

## 2015-02-18 MED ORDER — ACETAMINOPHEN 325 MG PO TABS: 650.0000 mg | ORAL_TABLET | Freq: Four times a day (QID) | ORAL | Status: DC | PRN

## 2015-02-18 MED ORDER — ACETAMINOPHEN 650 MG RE SUPP: 650.0000 mg | Freq: Four times a day (QID) | RECTAL | Status: DC | PRN

## 2015-02-18 MED ORDER — HYDRALAZINE HCL 20 MG/ML IJ SOLN
5.0000 mg | INTRAMUSCULAR | Status: DC | PRN
  Administered 2015-02-18 – 2015-02-21 (×3): 5 mg via INTRAVENOUS
  Filled 2015-02-18 (×3): qty 1

## 2015-02-18 MED ORDER — MORPHINE SULFATE (PF) 2 MG/ML IV SOLN
2.0000 mg | INTRAVENOUS | Status: DC | PRN
  Administered 2015-02-20 – 2015-02-22 (×2): 2 mg via INTRAVENOUS
  Filled 2015-02-18 (×3): qty 1

## 2015-02-18 MED ORDER — ENOXAPARIN SODIUM 40 MG/0.4ML ~~LOC~~ SOLN
40.0000 mg | SUBCUTANEOUS | Status: DC
  Administered 2015-02-18: 40 mg via SUBCUTANEOUS
  Filled 2015-02-18 (×2): qty 0.4

## 2015-02-18 MED ORDER — SODIUM CHLORIDE 0.9 % IJ SOLN
3.0000 mL | Freq: Two times a day (BID) | INTRAMUSCULAR | Status: DC
  Administered 2015-02-18 – 2015-02-22 (×8): 3 mL via INTRAVENOUS

## 2015-02-18 MED ORDER — OXYCODONE-ACETAMINOPHEN 5-325 MG PO TABS: 2.0000 | ORAL_TABLET | Freq: Four times a day (QID) | ORAL | Status: DC | PRN

## 2015-02-18 MED ORDER — DOXAZOSIN MESYLATE 4 MG PO TABS
4.0000 mg | ORAL_TABLET | Freq: Every day | ORAL | Status: DC
Start: 2015-02-18 — End: 2015-02-22
  Administered 2015-02-18 – 2015-02-21 (×4): 4 mg via ORAL
  Filled 2015-02-18 (×4): qty 1

## 2015-02-18 MED ORDER — ASPIRIN EC 81 MG PO TBEC
81.0000 mg | DELAYED_RELEASE_TABLET | Freq: Every day | ORAL | Status: DC
Start: 2015-02-18 — End: 2015-02-22
  Administered 2015-02-18 – 2015-02-22 (×5): 81 mg via ORAL
  Filled 2015-02-18 (×5): qty 1

## 2015-02-18 MED ORDER — HYDRALAZINE HCL 50 MG PO TABS
100.0000 mg | ORAL_TABLET | Freq: Three times a day (TID) | ORAL | Status: DC
  Administered 2015-02-18 – 2015-02-22 (×12): 100 mg via ORAL
  Filled 2015-02-18 (×14): qty 2

## 2015-02-18 MED ORDER — MORPHINE SULFATE (PF) 2 MG/ML IV SOLN
2.0000 mg | Freq: Once | INTRAVENOUS | Status: AC
  Administered 2015-02-18: 2 mg via INTRAVENOUS
  Filled 2015-02-18: qty 1

## 2015-02-18 MED ORDER — AMLODIPINE BESYLATE 10 MG PO TABS
10.0000 mg | ORAL_TABLET | Freq: Every day | ORAL | Status: DC
  Administered 2015-02-18 – 2015-02-22 (×5): 10 mg via ORAL
  Filled 2015-02-18 (×5): qty 1

## 2015-02-18 MED ORDER — SODIUM CHLORIDE 0.9 % IV SOLN
INTRAVENOUS | Status: DC
  Administered 2015-02-18: 06:00:00 via INTRAVENOUS

## 2015-02-18 MED ORDER — HYDRALAZINE HCL 50 MG PO TABS
50.0000 mg | ORAL_TABLET | Freq: Three times a day (TID) | ORAL | Status: DC
  Filled 2015-02-18 (×3): qty 1

## 2015-02-18 MED ORDER — NICOTINE 21 MG/24HR TD PT24
21.0000 mg | MEDICATED_PATCH | Freq: Every day | TRANSDERMAL | Status: DC
  Administered 2015-02-18 – 2015-02-22 (×5): 21 mg via TRANSDERMAL
  Filled 2015-02-18 (×5): qty 1

## 2015-02-18 MED ORDER — HYDROCHLOROTHIAZIDE 25 MG PO TABS
25.0000 mg | ORAL_TABLET | Freq: Every day | ORAL | Status: DC
  Filled 2015-02-18: qty 1

## 2015-02-18 MED ORDER — FAMOTIDINE 20 MG PO TABS
20.0000 mg | ORAL_TABLET | Freq: Every day | ORAL | Status: DC
  Administered 2015-02-18 – 2015-02-21 (×4): 20 mg via ORAL
  Filled 2015-02-18 (×4): qty 1

## 2015-02-18 MED ORDER — SODIUM CHLORIDE 0.9 % IV SOLN
1000.0000 mL | Freq: Once | INTRAVENOUS | Status: AC
  Administered 2015-02-18: 1000 mL via INTRAVENOUS

## 2015-02-18 MED ORDER — ATORVASTATIN CALCIUM 20 MG PO TABS
20.0000 mg | ORAL_TABLET | Freq: Every day | ORAL | Status: DC
  Administered 2015-02-18 – 2015-02-21 (×4): 20 mg via ORAL
  Filled 2015-02-18 (×5): qty 1

## 2015-02-18 MED ORDER — HEPARIN BOLUS VIA INFUSION
3000.0000 [IU] | Freq: Once | INTRAVENOUS | Status: AC
  Administered 2015-02-18: 3000 [IU] via INTRAVENOUS
  Filled 2015-02-18: qty 3000

## 2015-02-18 MED ORDER — OXYCODONE-ACETAMINOPHEN 5-325 MG PO TABS
2.0000 | ORAL_TABLET | Freq: Four times a day (QID) | ORAL | Status: DC | PRN
  Administered 2015-02-18 – 2015-02-21 (×3): 2 via ORAL
  Filled 2015-02-18 (×4): qty 2

## 2015-02-18 NOTE — Progress Notes (Signed)
ANTICOAGULATION CONSULT NOTE - Initial Consult  Pharmacy Consult for heparin Indication: empiric r/o DVT/PE  Allergies  Allergen Reactions  . Penicillins Itching and Nausea And Vomiting    Has patient had a PCN reaction causing immediate rash, facial/tongue/throat swelling, SOB or lightheadedness with hypotension: No Has patient had a PCN reaction causing severe rash involving mucus membranes or skin necrosis: No Has patient had a PCN reaction that required hospitalization: No Has patient had a PCN reaction occurring within the last 10 years: No      Patient Measurements: Height: 5' (152.4 cm) IBW/kg (Calculated) : 45.5 Heparin Dosing Weight: 75 kg   Vital Signs: Temp: 97.9 F (36.6 C) (09/11 0147) Temp Source: Oral (09/11 0147) BP: 160/63 mmHg (09/11 0400) Pulse Rate: 69 (09/11 0400)  Labs:  Recent Labs  02/15/15 1644  02/16/15 0300 02/16/15 0935 02/17/15 2010  HGB 15.6*  --   --   --  11.3*  HCT 47.8*  --   --   --  36.2  PLT 187  --   --   --  178  CREATININE 0.71  --   --   --  1.73*  CKTOTAL 347*  --  281*  --   --   TROPONINI 0.28*  < > 0.25* 0.26* 0.23*  < > = values in this interval not displayed.  Estimated Creatinine Clearance: 40.9 mL/min (by C-G formula based on Cr of 1.73).   Medical History: Past Medical History  Diagnosis Date  . Arthritis   . Stroke   . Coronary artery disease     a. remote hx of cath ~2000. B. cath  moderate disease in an RPL branch - too distal for PCI; 3rd RPLB lesion, 50% stenosed; hyperdynamic LV with near cavity obliteration & severely elevated LVEDP  . Asthma   . 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 Non-ischemic cardiomyopathy     cxr - mild cardiomegly, Echo 10/07/14 - severe LVH with LVEF 65-70%.  . Hypertension   . Heart attack   . Chronic bronchitis   . Heart murmur   . Wheelchair bound   . Bilateral chronic knee pain   . Accelerated hypertension      Medications:  Scheduled:  . amLODipine  10 mg Oral Daily  . aspirin EC  81 mg Oral Daily  . atorvastatin  20 mg Oral q1800  . doxazosin  4 mg Oral QHS  . famotidine  20 mg Oral QHS  . heparin  3,000 Units Intravenous Once  . hydrALAZINE  50 mg Oral TID  . hydrochlorothiazide  25 mg Oral Daily  . isosorbide mononitrate  30 mg Oral Daily  . losartan  100 mg Oral Daily  . nicotine  21 mg Transdermal Daily  . sodium chloride  3 mL Intravenous Q12H   Infusions:  . heparin      Assessment:  59 yr female discharged from hospital this AM.  Hospitalized for hypertensive urgency.  Presents to ED with c/o bilateral leg pain, chest pain and difficulty breathing  D-Dimer = 1.49  Pharmacy consulted to dose IV heparin for suspected DVT/possible PE  Goal of Therapy:  Heparin level 0.3-0.7 units/ml Monitor platelets by anticoagulation protocol: Yes   Plan:   Obtain baseline aPTT and PT/INR  Heparin 3000 unit IV bolus x 1 followed by infusion @ 1400 units/hr (per [place] nomogram)  Check heparin level 8 hr after heparin started  Check heparin level & CBC daily   Dema Timmons, Joselyn Glassman, PharmD 02/18/2015,4:36 AM

## 2015-02-18 NOTE — Progress Notes (Signed)
*  Preliminary Results* Bilateral lower extremity venous duplex completed. Study was technically difficult due to patient body habitus, depth of vessels, and poor patient cooperation. Visualized veins of bilateral lower extremities are negative for deep vein thrombosis. There is no evidence of Baker's cyst bilaterally.  02/18/2015 9:43 AM  Gertie Fey, RVT, RDCS, RDMS

## 2015-02-18 NOTE — Progress Notes (Signed)
Progress Note   [name]:[id number] DOB: 12/22/1955 DOA: 02/17/2015 PCP: No primary care provider on file.   Brief Narrative:   [name] is an 59 y.o. female with PMH of hypertension, hyperlipidemia, GERD, prior stroke, chronic respiratory failure on 2 L of oxygen at home secondary COPD, morbid obesity/wheelchair bound at baseline, CAD status post stent with chronic elevation of troponins and with recent hospital admission 02/15/15-02/17/15 after treatment for hypertensive urgency who developed bilateral calf pain after discharge and subsequently came back to the ED for further evaluation.  Assessment/Plan:   Principal Problem:   Leg pain/lower extremity weakness - Patient was started on empiric heparin and lower extremity Dopplers ordered to rule out DVT. - Dopplers negative, discontinue heparin. - Plain films of the left knee were negative for acute bony abnormalities. - Patient appears to be deconditioned and would benefit from SNF. She refused during her previous hospital stay.  Active Problems:   Morbid obesity - Encourage weight loss. BMI 52.2.    Cigarette smoker - Tobacco cessation counseling per nursing. Continue nicotine patch.    GERD - Continue Pepcid.    Osteoarthritis - On Percocet as needed for pain. Plain films of the knee confirm left tricompartmental arthritis.    CAD (coronary artery disease) / chronically elevated troponin / history of stroke - Continue aspirin and statin. Continue nitroglycerin as needed.    HLD (hyperlipidemia) - Continue statin.    Chronic respiratory failure- on home O2 - Continue supplemental oxygen.    Hypertension - Continue Norvasc and Cardura.    AKI (acute kidney injury) - Likely secondary to treatment with ACE inhibitor, diuretics and NSAIDs. - Hold nephrotoxic medications and gently hydrated. Creatinine improved 1.73---> 1.1 with these measures. - Normal saline lock IV.    DVT Prophylaxis -  Lovenox ordered.  Family Communication: No family currently at the bedside. Disposition Plan: Child psychotherapist consultation for SNF placement. Code Status:     Code Status Orders        Start     Ordered   02/18/15 0408  Full code   Continuous     02/18/15 0408        IV Access:    Peripheral IV   Procedures and diagnostic studies:   Dg Chest 2 View  02/17/2015   CLINICAL DATA:  59 year old female with shortness of breath  EXAM: CHEST  2 VIEW  COMPARISON:  Radiograph dated 02/15/2015  FINDINGS: Two views of the chest do not demonstrate any focal consolidation, pleural effusion, or pneumothorax. Stable cardiomegaly. The osseous structures are grossly unremarkable.  IMPRESSION: No active cardiopulmonary disease.   Electronically Signed   By: Elgie Collard M.D.   On: 02/17/2015 23:54   Dg Knee 1-2 Views Left  02/18/2015   CLINICAL DATA:  Recurrent left knee pain after a fall on 12/05/2014.  EXAM: LEFT KNEE - 1-2 VIEW  COMPARISON:  12/05/2013  FINDINGS: Prominent tricompartment degenerative changes throughout the left knee. Medial and patellofemoral compartment narrowing with prominent osteophytes in all 3 compartments. No significant effusion. No acute fracture or dislocation. Vascular calcifications.  IMPRESSION: Prominent tricompartment degenerative changes throughout the left knee. No acute bony abnormalities.   Electronically Signed   By: Burman Nieves M.D.   On: 02/18/2015 05:06     Medical Consultants:    None.  Anti-Infectives:    None.  Subjective:   [name] tells me that her "legs gave out on her "when she got  home after being discharged yesterday. She was unable to adequately care for herself. She had some lower extremity pain, but this appears to be resolved at this point. No nausea or vomiting. Says she is hungry and she wants her diet advanced.  Objective:    Filed Vitals:   02/18/15 0400 02/18/15 0500 02/18/15 0507 02/18/15 0632  BP: 160/63  190/65  163/60  Pulse: 69 71    Temp:  98.2 F (36.8 C)    TempSrc:  Oral    Resp: 30 22    Height:   5' (1.524 m)   Weight:   121.038 kg (266 lb 13.4 oz)   SpO2: 88% 99%      Intake/Output Summary (Last 24 hours) at 02/18/15 0930 Last data filed at 02/18/15 0600  Gross per 24 hour  Intake 1618.73 ml  Output      0 ml  Net 1618.73 ml   Filed Weights   02/18/15 0507  Weight: 121.038 kg (266 lb 13.4 oz)    Exam: Gen:  NAD Cardiovascular:  RRR, No M/R/G Respiratory:  Lungs CTAB Gastrointestinal:  Abdomen soft, NT/ND, + BS Extremities:  Edema noted   Data Reviewed:    Labs: Basic Metabolic Panel:  Recent Labs Lab 02/15/15 1644 02/17/15 2010 02/18/15 0514  NA 144 142 143  K 3.5 3.6 3.5  CL 104 108 111  CO2 GLUCOSE 83 126* 101*  BUN 13 30* 25*  CREATININE 0.71 1.73* 1.10*  CALCIUM 9.8 8.9 8.4*   GFR Estimated Creatinine Clearance: 65 mL/min (by C-G formula based on Cr of 1.1). Liver Function Tests:  Recent Labs Lab 02/15/15 1644 02/18/15 0514  AST 29 20  ALT 16 15  ALKPHOS 77 61  BILITOT 0.6 0.3  PROT 9.1* 6.6  ALBUMIN 4.3 3.5   Coagulation profile  Recent Labs Lab 02/18/15 0514  INR 0.98    CBC:  Recent Labs Lab 02/15/15 1644 02/17/15 2010 02/18/15 0514  WBC 11.2* 9.7 9.3  NEUTROABS 7.7 7.0  --   HGB 15.6* 11.3* 11.1*  HCT 47.8* 36.2 34.8*  MCV 81.4 81.9 82.7  PLT 187 178 163   Cardiac Enzymes:  Recent Labs Lab 02/15/15 1644 02/15/15 2245 02/16/15 0300 02/16/15 0935 02/17/15 2010 02/18/15 0514  CKTOTAL 347*  --  281*  --   --   --   TROPONINI 0.28* 0.25* 0.25* 0.26* 0.23* 0.23*   BNP (last 3 results) No results for input(s): PROBNP in the last 8760 hours. CBG:  Recent Labs Lab 02/18/15 0742  GLUCAP 92   D-Dimer:  Recent Labs  02/17/15 2010  DDIMER 1.49*   Hgb A1c: No results for input(s): HGBA1C in the last 72 hours. Lipid Profile: No results for input(s): CHOL, HDL, LDLCALC, TRIG, CHOLHDL,  LDLDIRECT in the last 72 hours. Thyroid function studies: No results for input(s): TSH, T4TOTAL, T3FREE, THYROIDAB in the last 72 hours.  Invalid input(s): FREET3 Anemia work up: No results for input(s): VITAMINB12, FOLATE, FERRITIN, TIBC, IRON, RETICCTPCT in the last 72 hours. Sepsis Labs:  Recent Labs Lab 02/15/15 1644 02/15/15 1714 02/17/15 2010 02/18/15 0514  WBC 11.2*  --  9.7 9.3  LATICACIDVEN  --  2.05*  --   --    Microbiology Recent Results (from the past 240 hour(s))  Urine culture     Status: None   Collection Time: 02/15/15  4:15 PM  Result Value Ref Range Status   Specimen Description URINE, CLEAN CATCH  Final  Special Requests NONE  Final   Culture   Final    MULTIPLE SPECIES PRESENT, SUGGEST RECOLLECTION Performed at [facility]    Report Status 02/17/2015 FINAL  Final     Medications:   . amLODipine  10 mg Oral Daily  . aspirin EC  81 mg Oral Daily  . atorvastatin  20 mg Oral q1800  . doxazosin  4 mg Oral QHS  . famotidine  20 mg Oral QHS  . hydrALAZINE  100 mg Oral TID  . isosorbide mononitrate  30 mg Oral Daily  . nicotine  21 mg Transdermal Daily  . sodium chloride  3 mL Intravenous Q12H   Continuous Infusions: . sodium chloride 75 mL/hr at 02/18/15 0543  . heparin 1,400 Units/hr (02/18/15 0544)    Time spent: 25 minutes.   LOS: 0 days   Lottie Siska  [facility] Pager [phone]. If unable to reach me by pager, please call my cell phone at [phone].  *Please refer to [email or web address], password [email or web address] to get updated schedule on who will round on this patient, as hospitalists switch teams weekly. If 7PM-7AM, please contact night-coverage at [email or web address], password [email or web address] for any overnight needs.  02/18/2015, 9:30 AM

## 2015-02-18 NOTE — H&P (Signed)
[facility] History and Physical  MCKYNNA VANLOAN ZOX:[id number] DOB: [birth year] DOA: 02/17/2015  Referring physician: ED physician PCP: No primary care provider on file.  Specialists:   Chief Complaint: Leg pain  HPI: [name] is a 59 y.o. female with PMH of hypertension, hyperlipidemia, GERD, stroke, COPD on 2 L oxygen at home, morbid obesity, wheelchair bound,CAD, s/p of stent, who presents with leg pain.  Patient was recently hospitalized from 9/8 to 9/10 due to hypertensive urgency. After she went home yesterday, she developed pain in both legs. The pain mainly located in bilateral calf areas, and also in front of left knee. He denies any injury. She said that she had one episode of mild chest pain earlier today, which has resolved. Currently no chest pain. She has shortness of breath which is at her baseline. Patient does not have abdominal pain, diarrhea, symptoms of UTI, unilateral weakness, or rashes. Complains of generalized weakness.  In ED, patient was found to have elevated d-dimer 1.49, acute renal injury, troponin 0.23, BNP 190, temperature normal, no tachycardia, negative chest x-ray. Patient is admitted to inpatient for further evaluate the treatment.  Where does patient live?   At home    Can patient participate in ADLs?   Little  Review of Systems:   General: no fevers, chills, no changes in body weight, has poor appetite, has fatigue HEENT: no blurry vision, hearing changes or sore throat Pulm: has dyspnea, no coughing, wheezing CV: had chest pain, no palpitations Abd: no nausea, vomiting, abdominal pain, diarrhea, constipation GU: no dysuria, burning on urination, increased urinary frequency, hematuria  Ext: no leg edema Neuro: no unilateral weakness, numbness, or tingling, no vision change or hearing loss Skin: no rash MSK: No muscle spasm, no deformity, no limitation of range of movement in spin Heme: No easy bruising.  Travel history: No recent  long distant travel.  Allergy:  Allergies  Allergen Reactions  . Penicillins Itching and Nausea And Vomiting    Has patient had a PCN reaction causing immediate rash, facial/tongue/throat swelling, SOB or lightheadedness with hypotension: No Has patient had a PCN reaction causing severe rash involving mucus membranes or skin necrosis: No Has patient had a PCN reaction that required hospitalization: No Has patient had a PCN reaction occurring within the last 10 years: No      Past Medical History  Diagnosis Date  . Arthritis   . Stroke   . Coronary artery disease     a. remote hx of cath ~2000. B. cath  moderate disease in an RPL branch - too distal for PCI; 3rd RPLB lesion, 50% stenosed; hyperdynamic LV with near cavity obliteration & severely elevated LVEDP  . Asthma   . 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 Non-ischemic cardiomyopathy     cxr - mild cardiomegly, Echo 10/07/14 - severe LVH with LVEF 65-70%.  . Hypertension   . Heart attack   . Chronic bronchitis   . Heart murmur   . Wheelchair bound   . Bilateral chronic knee pain   . Accelerated hypertension     Past Surgical History  Procedure Laterality Date  . Abdominal hysterectomy    . I&d extremity  08/18/2011    Procedure: IRRIGATION AND DEBRIDEMENT EXTREMITY;  Surgeon: Sharma Covert, MD;  Location: [facility] OR;  Service: Orthopedics;  Laterality: Right;  I&D Right Long Finger  . Amputation  08/21/2011    Procedure: AMPUTATION  DIGIT;  Surgeon: Sharma Covert, MD;  Location: [facility] OR;  Service: Orthopedics;  Laterality: Right;  . Flexible sigmoidoscopy  04/28/2012    Procedure: FLEXIBLE SIGMOIDOSCOPY;  Surgeon: Petra Kuba, MD;  Location: [facility] ENDOSCOPY;  Service: Endoscopy;  Laterality: N/A;  unsedated, unprepped  . Cardiac catheterization N/A 10/09/2014    Procedure: Left Heart Cath and Coronary Angiography;  Surgeon: Marykay Lex, MD;  Location: [facility] INVASIVE CV LAB  CUPID;  Service: Cardiovascular;  Laterality: N/A;  . Coronary stent placement    . Cardiac catheterization  10/2014    normal coronary arteries aside for a distal RPLB branch with 50% stenosis    Social History:  reports that she has been smoking Cigarettes.  She has a 10 pack-year smoking history. She has quit using smokeless tobacco. Her smokeless tobacco use included Snuff. She reports that she drinks about 3.6 oz of alcohol per week. She reports that she does not use illicit drugs.  Family History:  Family History  Problem Relation Age of Onset  . Diabetes type I Mother   . Hypertension Mother   . Arthritis Mother   . Stroke Sister   . Diabetes type I Brother   . Hypertension Brother   . Healthy Brother   . Diabetes type II Brother   . Heart disease Brother   . Healthy Brother   . Healthy Brother   . Diabetes type I Sister   . Hypertension Sister   . Asthma Sister   . Bronchitis Sister      Prior to Admission medications   Medication Sig Start Date End Date Taking? Authorizing Provider  albuterol (PROVENTIL HFA;VENTOLIN HFA) 108 (90 BASE) MCG/ACT inhaler 1 to 2 puffs every 4 to 6 hours as needed Patient taking differently: Inhale 1-2 puffs into the lungs every 4 (four) hours as needed for wheezing or shortness of breath.  12/26/14  Yes Scott T Alben Spittle, PA-C  amLODipine (NORVASC) 5 MG tablet Take 2 tablets (10 mg total) by mouth daily. 02/17/15  Yes Calvert Cantor, MD  aspirin EC 81 MG EC tablet Take 1 tablet (81 mg total) by mouth daily. 10/10/14  Yes Bhavinkumar Bhagat, PA  atorvastatin (LIPITOR) 20 MG tablet Take 1 tablet (20 mg total) by mouth daily at 6 PM. 02/17/15  Yes Calvert Cantor, MD  doxazosin (CARDURA) 4 MG tablet Take 1 tablet (4 mg total) by mouth at bedtime. 02/17/15  Yes Calvert Cantor, MD  famotidine (PEPCID) 20 MG tablet One at bedtime Patient taking differently: Take 20 mg by mouth at bedtime.  02/17/15  Yes Calvert Cantor, MD  hydrALAZINE (APRESOLINE) 50 MG tablet Take 1  tablet (50 mg total) by mouth 3 (three) times daily. 02/17/15  Yes Calvert Cantor, MD  hydrochlorothiazide (HYDRODIURIL) 25 MG tablet Take 1 tablet (25 mg total) by mouth daily. 02/17/15  Yes Calvert Cantor, MD  isosorbide mononitrate (IMDUR) 30 MG 24 hr tablet Take 1 tablet (30 mg total) by mouth daily. 02/17/15  Yes Calvert Cantor, MD  losartan (COZAAR) 50 MG tablet Take 2 tablets (100 mg total) by mouth daily. 02/17/15  Yes Calvert Cantor, MD    Physical Exam: Filed Vitals:   02/17/15 1936 02/18/15 0147 02/18/15 0200 02/18/15 0400  BP: 139/38 165/66 156/58 160/63  Pulse: 82 74 74 69  Temp:  97.9 F (36.6 C)    TempSrc:  Oral    Resp: 25 28 22 30   Height:  5' (1.524 m)    SpO2: 96% 96% 95%  88%   General: Not in acute distress. Mobile obesity. HEENT:       Eyes: PERRL, EOMI, no scleral icterus.       ENT: No discharge from the ears and nose, no pharynx injection, no tonsillar enlargement.        Neck: No JVD, no bruit, no mass felt. Heme: No neck lymph node enlargement. Cardiac: S1/S2, RRR, No murmurs, No gallops or rubs. Pulm: Good air movement bilaterally. No rales, wheezing, rhonchi or rubs. Abd: Soft, nondistended, nontender, no rebound pain, no organomegaly, BS present. Ext: No pitting leg edema bilaterally. 2+DP/PT pulse bilaterally.There is tenderness over calf areas bilaterally, also to left front knee. No erythema or warmth.  Musculoskeletal: No joint deformities, No joint redness or warmth, no limitation of ROM in spin. Skin: No rashes.  Neuro: Alert, oriented X3, cranial nerves II-XII grossly intact, muscle strength 5/5 in all extremities, sensation to light touch intact. Brachial reflex 2+ bilaterally. Knee reflex 1+ bilaterally. Negative Babinski's sign. Normal finger to nose test. Psych: Patient is not psychotic, no suicidal or hemocidal ideation.  Labs on Admission:  Basic Metabolic Panel:  Recent Labs Lab 02/15/15 1644 02/17/15 2010  NA 144 142  K 3.5 3.6  CL 104 108   CO2 29 25  GLUCOSE 83 126*  BUN 13 30*  CREATININE 0.71 1.73*  CALCIUM 9.8 8.9   Liver Function Tests:  Recent Labs Lab 02/15/15 1644  AST 29  ALT 16  ALKPHOS 77  BILITOT 0.6  PROT 9.1*  ALBUMIN 4.3   No results for input(s): LIPASE, AMYLASE in the last 168 hours. No results for input(s): AMMONIA in the last 168 hours. CBC:  Recent Labs Lab 02/15/15 1644 02/17/15 2010  WBC 11.2* 9.7  NEUTROABS 7.7 7.0  HGB 15.6* 11.3*  HCT 47.8* 36.2  MCV 81.4 81.9  PLT 187 178   Cardiac Enzymes:  Recent Labs Lab 02/15/15 1644 02/15/15 2245 02/16/15 0300 02/16/15 0935 02/17/15 2010  CKTOTAL 347*  --  281*  --   --   TROPONINI 0.28* 0.25* 0.25* 0.26* 0.23*    BNP (last 3 results)  Recent Labs  02/17/15 2010  BNP 190.3*    ProBNP (last 3 results) No results for input(s): PROBNP in the last 8760 hours.  CBG: No results for input(s): GLUCAP in the last 168 hours.  Radiological Exams on Admission: Dg Chest 2 View  02/17/2015   CLINICAL DATA:  59 year old female with shortness of breath  EXAM: CHEST  2 VIEW  COMPARISON:  Radiograph dated 02/15/2015  FINDINGS: Two views of the chest do not demonstrate any focal consolidation, pleural effusion, or pneumothorax. Stable cardiomegaly. The osseous structures are grossly unremarkable.  IMPRESSION: No active cardiopulmonary disease.   Electronically Signed   By: Elgie Collard M.D.   On: 02/17/2015 23:54    EKG: Independently reviewed.  Abnormal findings:  QTC 490, PVC, mild ST depression in inferior leads. Assessment/Plan Principal Problem:   Leg pain Active Problems:   Morbid obesity   Cigarette smoker   GERD   Osteoarthritis   CAD (coronary artery disease)   Stroke   HLD (hyperlipidemia)   Chronic respiratory failure- on home O2   Elevated troponin   Hypertension   AKI (acute kidney injury)   Leg pain: The pain mainly located in bilateral calf areas, and also in front of left knee. It is concerning for DVT.   -admit to tele bed -will start IV heparin -follow up LE doppler. Will d/c IV  heparin if LE doppler negative. -When necessary Percocet and morphine for pain - X-ray for Left knee   AKI: Creatinine 1.73 which was 0.71 on 02/15/15.  Likely due to prerenal secondary to dehydration and continuation of ACEI and diruetics. ED give one dose of ibuprofen, will discontinue and avoid.  - IVF: NS 75 cc/h - Check FeUrea - US-renal - Follow up renal function by BMP - Hold losartan and HCTZ  HTN: -Hold losartan and HCTZ due to acute renal injury -Increase oral hydralazine dose from 50-100 mg 3 times a day -IV hydralazine when necessary -Continue home Amlodipine, Cardura  GERD: -Pepcid  Chronic elevation of troponin and CAD: s/p of stent. Patient's troponin has been in the range of 0.25-0.31. Currently patient does not have chest pain. She had one episode of chest pain early, which has resolved. EKG has a mild T-wave depression in inferior leads. -Troponin 3 -EKG pneumonia -Aspirin, Lipitor,  Tobacco abuse: -Did counseling about importance of quitting smoking -Nicotine patch  Hx of Stroke:  - on ASA  HLD: Last LDL was 86 on 10/07/14 -Continue home medications: Fetal  COPD and chronic respiratory failure- on home O2:  -When necessary albuterol nebulizer   DVT ppx: On IV Heparin    Code Status: Full code Family Communication: None at bed side.  Disposition Plan: Admit to inpatient   Date of Service 02/18/2015    Lorretta Harp [facility] Pager [phone]  If 7PM-7AM, please contact night-coverage [email or web address] Password [facility] 02/18/2015, 4:58 AM

## 2015-02-18 NOTE — Progress Notes (Signed)
Utilization Review Completed.[name] T9/04/2015  

## 2015-02-19 ENCOUNTER — Inpatient Hospital Stay (HOSPITAL_COMMUNITY): Payer: Medicaid Other

## 2015-02-19 DIAGNOSIS — E785 Hyperlipidemia, unspecified: Secondary | ICD-10-CM

## 2015-02-19 DIAGNOSIS — I209 Angina pectoris, unspecified: Secondary | ICD-10-CM

## 2015-02-19 DIAGNOSIS — R079 Chest pain, unspecified: Secondary | ICD-10-CM | POA: Diagnosis present

## 2015-02-19 DIAGNOSIS — I25110 Atherosclerotic heart disease of native coronary artery with unstable angina pectoris: Secondary | ICD-10-CM

## 2015-02-19 LAB — BASIC METABOLIC PANEL
Anion gap: 7 (ref 5–15)
BUN: 19 mg/dL (ref 6–20)
CHLORIDE: 109 mmol/L (ref 101–111)
CO2: 26 mmol/L (ref 22–32)
Calcium: 9.1 mg/dL (ref 8.9–10.3)
Creatinine, Ser: 0.99 mg/dL (ref 0.44–1.00)
GFR calc Af Amer: >60 mL/min (ref >60)
GFR calc non Af Amer: >60 mL/min (ref >60)
GLUCOSE: 109 mg/dL — AB (ref 65–99)
POTASSIUM: 3.5 mmol/L (ref 3.5–5.1)
Sodium: 142 mmol/L (ref 135–145)

## 2015-02-19 LAB — UREA NITROGEN, URINE: Urea Nitrogen, Ur: 675 mg/dL

## 2015-02-19 LAB — TROPONIN I
Troponin I: 0.24 ng/mL — ABNORMAL HIGH (ref <0.031)
Troponin I: 0.22 ng/mL — ABNORMAL HIGH (ref <0.031)

## 2015-02-19 MED ORDER — HEPARIN BOLUS VIA INFUSION
3000.0000 [IU] | Freq: Once | INTRAVENOUS | Status: AC
  Administered 2015-02-19: 3000 [IU] via INTRAVENOUS
  Filled 2015-02-19: qty 3000

## 2015-02-19 MED ORDER — HEPARIN SODIUM (PORCINE) 5000 UNIT/ML IJ SOLN
5000.0000 [IU] | Freq: Three times a day (TID) | INTRAMUSCULAR | Status: DC
  Administered 2015-02-20 – 2015-02-22 (×7): 5000 [IU] via SUBCUTANEOUS
  Filled 2015-02-19 (×10): qty 1

## 2015-02-19 MED ORDER — HEPARIN (PORCINE) IN NACL 100-0.45 UNIT/ML-% IJ SOLN
1400.0000 [IU]/h | INTRAMUSCULAR | Status: DC
  Administered 2015-02-19: 1400 [IU]/h via INTRAVENOUS
  Filled 2015-02-19: qty 250

## 2015-02-19 MED ORDER — MORPHINE SULFATE (PF) 4 MG/ML IV SOLN
4.0000 mg | Freq: Once | INTRAVENOUS | Status: AC
  Administered 2015-02-19: 4 mg via INTRAVENOUS

## 2015-02-19 MED ORDER — METOPROLOL SUCCINATE ER 25 MG PO TB24
12.5000 mg | ORAL_TABLET | Freq: Every day | ORAL | Status: DC
  Administered 2015-02-19 – 2015-02-20 (×2): 12.5 mg via ORAL
  Filled 2015-02-19 (×2): qty 1

## 2015-02-19 MED ORDER — MORPHINE SULFATE (PF) 4 MG/ML IV SOLN
INTRAVENOUS | Status: AC
  Filled 2015-02-19: qty 1

## 2015-02-19 MED ORDER — IOHEXOL 350 MG/ML SOLN
100.0000 mL | Freq: Once | INTRAVENOUS | Status: AC | PRN
  Administered 2015-02-19: 100 mL via INTRAVENOUS

## 2015-02-19 MED ORDER — HEPARIN BOLUS VIA INFUSION
4000.0000 [IU] | Freq: Once | INTRAVENOUS | Status: DC
  Filled 2015-02-19: qty 4000

## 2015-02-19 NOTE — Progress Notes (Signed)
OT Cancellation Note  Patient Details Name: [name] MRN: [id number] DOB: [birth year]   Cancelled Treatment:    Reason Eval/Treat Not Completed: Medical issues which prohibited therapy Note pending work up to rule our PE and pt somnolent. Will check back later time.  Lennox Laity  [phone] 02/19/2015, 1:40 PM

## 2015-02-19 NOTE — Consult Note (Signed)
Patient ID: [name] MRN: [id number], DOB/AGE: 1956/02/08   Admit date: 02/17/2015   Primary Physician: No primary care provider on file. Primary Cardiologist: Dr. Royann Shivers  Pt. Profile:  59 y/o obese female with h/o nonobstructive CAD, O2 dependent COPD, HTN and h/o medication noncompliance admitted for chest pain eval.  Problem List  Past Medical History  Diagnosis Date  . Arthritis   . Stroke   . Coronary artery disease     a. remote hx of cath ~2000. B. cath  moderate disease in an RPL branch - too distal for PCI; 3rd RPLB lesion, 50% stenosed; hyperdynamic LV with near cavity obliteration & severely elevated LVEDP  . Asthma   . 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 Non-ischemic cardiomyopathy     cxr - mild cardiomegly, Echo 10/07/14 - severe LVH with LVEF 65-70%.  . Hypertension   . Heart attack   . Chronic bronchitis   . Heart murmur   . Wheelchair bound   . Bilateral chronic knee pain   . Accelerated hypertension     Past Surgical History  Procedure Laterality Date  . Abdominal hysterectomy    . I&d extremity  08/18/2011    Procedure: IRRIGATION AND DEBRIDEMENT EXTREMITY;  Surgeon: Sharma Covert, MD;  Location: [facility] OR;  Service: Orthopedics;  Laterality: Right;  I&D Right Long Finger  . Amputation  08/21/2011    Procedure: AMPUTATION DIGIT;  Surgeon: Sharma Covert, MD;  Location: [facility] OR;  Service: Orthopedics;  Laterality: Right;  . Flexible sigmoidoscopy  04/28/2012    Procedure: FLEXIBLE SIGMOIDOSCOPY;  Surgeon: Petra Kuba, MD;  Location: [facility] ENDOSCOPY;  Service: Endoscopy;  Laterality: N/A;  unsedated, unprepped  . Cardiac catheterization N/A 10/09/2014    Procedure: Left Heart Cath and Coronary Angiography;  Surgeon: Marykay Lex, MD;  Location: [facility] INVASIVE CV LAB CUPID;  Service: Cardiovascular;  Laterality: N/A;  . Coronary stent placement    . Cardiac catheterization  10/2014    normal  coronary arteries aside for a distal RPLB branch with 50% stenosis     Allergies  Allergies  Allergen Reactions  . Penicillins Itching and Nausea And Vomiting    Has patient had a PCN reaction causing immediate rash, facial/tongue/throat swelling, SOB or lightheadedness with hypotension: No Has patient had a PCN reaction causing severe rash involving mucus membranes or skin necrosis: No Has patient had a PCN reaction that required hospitalization: No Has patient had a PCN reaction occurring within the last 10 years: No      HPI  Note: patient is somnolent given recent Rx with morphine for CP. She is easily arousable but not able to contribute to HPI. Most of info has been obtained from chart review and RN report.   The patient is a 59 y.o. female with a hx of reported CAD. She has a h/o cardiac catheterization in 2001 with balloon angioplasty. Other history includes HTN, prior stroke, asthma, tobacco abuse and oxygen dependend COPD on 2L/min home O2. She is wheelchair-bound because of knee issues. she was dmitted earlier in the spring from 4/29-10/11/14 with chest discomfort in the setting of uncontrolled hypertension. She had mildly elevated troponins with flat trend. Cardiac catheterization demonstrated 50% stenosis in a third branch of RPLB. This was far too distal to consider PCI. She had otherwise normal coronary arteries. Etiology of elevated troponin was felt to be related to accelerated hypertension.  She was noted to have hyperdynamic LV with severe systolic hypertension and LVEDP 30-35. Patient was noted to have near cavity obliteration. She was last seen in our office 12/26/14 and BP was noted to be elevated. Amlodipine was added. Per office notes, she has had issues with medication compliance due to difficulty affording her heds.    Per records, she was recently admitted 9/8-/10/16 for hypertensive urgency. She presented back to [facility], 1 day later, on 9/11 with complaints of chest pain  and bilateral leg pain. D-dimer was abnormal at 1.49 on 9/10. Dopplers were negative for DVT. No chest CT has been performed. Lasb showed AKI with Scr at 1.73. EKG shows inferolateral ST depressions (noted on prior EKGs). Cardiac enzymes are mildly abnormal with flat trend (0.23, 0.22, 0.23). BP on arrival was elevated in the 160s systolic.   As noted above, was unable to elicit any history from the patient at time of assessment give her somnolence. Despite this she did not appear to be in any discomfort or respiratory distress.  VSS.   Home Medications  Prior to Admission medications   Medication Sig Start Date End Date Taking? Authorizing Provider  albuterol (PROVENTIL HFA;VENTOLIN HFA) 108 (90 BASE) MCG/ACT inhaler 1 to 2 puffs every 4 to 6 hours as needed Patient taking differently: Inhale 1-2 puffs into the lungs every 4 (four) hours as needed for wheezing or shortness of breath.  12/26/14  Yes Scott T Alben Spittle, PA-C  amLODipine (NORVASC) 5 MG tablet Take 2 tablets (10 mg total) by mouth daily. 02/17/15  Yes Calvert Cantor, MD  aspirin EC 81 MG EC tablet Take 1 tablet (81 mg total) by mouth daily. 10/10/14  Yes Bhavinkumar Bhagat, PA  atorvastatin (LIPITOR) 20 MG tablet Take 1 tablet (20 mg total) by mouth daily at 6 PM. 02/17/15  Yes Calvert Cantor, MD  doxazosin (CARDURA) 4 MG tablet Take 1 tablet (4 mg total) by mouth at bedtime. 02/17/15  Yes Calvert Cantor, MD  famotidine (PEPCID) 20 MG tablet One at bedtime Patient taking differently: Take 20 mg by mouth at bedtime.  02/17/15  Yes Calvert Cantor, MD  hydrALAZINE (APRESOLINE) 50 MG tablet Take 1 tablet (50 mg total) by mouth 3 (three) times daily. 02/17/15  Yes Calvert Cantor, MD  hydrochlorothiazide (HYDRODIURIL) 25 MG tablet Take 1 tablet (25 mg total) by mouth daily. 02/17/15  Yes Calvert Cantor, MD  isosorbide mononitrate (IMDUR) 30 MG 24 hr tablet Take 1 tablet (30 mg total) by mouth daily. 02/17/15  Yes Calvert Cantor, MD  losartan (COZAAR) 50 MG tablet  Take 2 tablets (100 mg total) by mouth daily. 02/17/15  Yes Calvert Cantor, MD    Family History  Family History  Problem Relation Age of Onset  . Diabetes type I Mother   . Hypertension Mother   . Arthritis Mother   . Stroke Sister   . Diabetes type I Brother   . Hypertension Brother   . Healthy Brother   . Diabetes type II Brother   . Heart disease Brother   . Healthy Brother   . Healthy Brother   . Diabetes type I Sister   . Hypertension Sister   . Asthma Sister   . Bronchitis Sister     Social History  Social History   Social History  . Marital Status: Legally Separated    Spouse Name: N/A  . Number of Children: N/A  . Years of Education: N/A   Occupational History  . Not on file.  Social History Main Topics  . Smoking status: Current Every Day Smoker -- 0.50 packs/day for 20 years    Types: Cigarettes  . Smokeless tobacco: Former Neurosurgeon    Types: Snuff  . Alcohol Use: 3.6 oz/week    6 Cans of beer per week     Comment: 04/27/12- "amount varies"  . Drug Use: No  . Sexual Activity: Not on file   Other Topics Concern  . Not on file   Social History Narrative   ** Merged History Encounter **         Review of Systems General:  No chills, fever, night sweats or weight changes.  Cardiovascular:  No chest pain, dyspnea on exertion, edema, orthopnea, palpitations, paroxysmal nocturnal dyspnea. Dermatological: No rash, lesions/masses Respiratory: No cough, dyspnea Urologic: No hematuria, dysuria Abdominal:   No nausea, vomiting, diarrhea, bright red blood per rectum, melena, or hematemesis Neurologic:  No visual changes, wkns, changes in mental status. All other systems reviewed and are otherwise negative except as noted above.  Physical Exam  Blood pressure 141/49, pulse 59, temperature 98.3 F (36.8 C), temperature source Oral, resp. rate 20, height 5' (1.524 m), weight 266 lb 13.4 oz (121.038 kg), SpO2 100 %.  General: somnolent, NAD Psych:  somnolent Neuro: somnolent but arousable  HEENT: Normal  Neck: Supple without bruits or JVD. Lungs:  Resp regular and unlabored, scattered rhonchi Heart: RRR no s3, s4, or murmurs. Abdomen: Soft, non-tender, non-distended, BS + x 4.  Extremities: No clubbing, cyanosis or edema. DP/PT/Radials 2+ and equal bilaterally.  Labs  Troponin (Point of Care Test) No results for input(s): TROPIPOC in the last 72 hours.  Recent Labs  02/17/15 2010 02/18/15 0514 02/18/15 1010 02/18/15 1644  TROPONINI 0.23* 0.23* 0.22* 0.23*   Lab Results  Component Value Date   WBC 9.3 02/18/2015   HGB 11.1* 02/18/2015   HCT 34.8* 02/18/2015   MCV 82.7 02/18/2015   PLT 163 02/18/2015    Recent Labs Lab 02/18/15 0514  NA 143  K 3.5  CL 111  CO2 24  BUN 25*  CREATININE 1.10*  CALCIUM 8.4*  PROT 6.6  BILITOT 0.3  ALKPHOS 61  ALT 15  AST 20  GLUCOSE 101*   Lab Results  Component Value Date   CHOL 154 10/07/2014   HDL 46 10/07/2014   LDLCALC 86 10/07/2014   TRIG 108 10/07/2014   Lab Results  Component Value Date   DDIMER 1.49* 02/17/2015     Radiology/Studies  Dg Chest 2 View  02/17/2015   CLINICAL DATA:  59 year old female with shortness of breath  EXAM: CHEST  2 VIEW  COMPARISON:  Radiograph dated 02/15/2015  FINDINGS: Two views of the chest do not demonstrate any focal consolidation, pleural effusion, or pneumothorax. Stable cardiomegaly. The osseous structures are grossly unremarkable.  IMPRESSION: No active cardiopulmonary disease.   Electronically Signed   By: Elgie Collard M.D.   On: 02/17/2015 23:54   Dg Knee 1-2 Views Left  02/18/2015   CLINICAL DATA:  Recurrent left knee pain after a fall on 12/05/2014.  EXAM: LEFT KNEE - 1-2 VIEW  COMPARISON:  12/05/2013  FINDINGS: Prominent tricompartment degenerative changes throughout the left knee. Medial and patellofemoral compartment narrowing with prominent osteophytes in all 3 compartments. No significant effusion. No acute  fracture or dislocation. Vascular calcifications.  IMPRESSION: Prominent tricompartment degenerative changes throughout the left knee. No acute bony abnormalities.   Electronically Signed   By: Burman Nieves M.D.   On:  02/18/2015 05:06   US Renal  02/18/2015   CLINICAL DATA:  Acute kidney injury.  EXAM: RENAL / URINARY TRACT ULTRASOUND COMPLETE  COMPARISON:  CT abdomen and pelvis 02/23/2008. Renal ultrasound 02/19/2008.  FINDINGS: Examination was limited by patient body habitus, with the left kidney particularly not well visualized.  Right Kidney:  Length: 10.0 cm. Echogenicity within normal limits. No mass or hydronephrosis visualized.  Left Kidney:  Length: 10.2 cm.   No gross mass or hydronephrosis visualized.  Bladder:  Appears normal for degree of bladder distention.  IMPRESSION: Partially limited examination due to patient body habitus with suboptimal visualization of the left kidney. No hydronephrosis identified.   Electronically Signed   By: Sebastian Ache M.D.   On: 02/18/2015 15:06   Dg Chest Port 1 View  02/15/2015   CLINICAL DATA:  Hypertension ; headache with blurred vision  EXAM: PORTABLE CHEST - 1 VIEW  COMPARISON:  October 07, 2014  FINDINGS: There is no edema or consolidation. Heart is mildly enlarged with pulmonary vascularity within normal limits. No adenopathy. There is atherosclerotic change in the aortic arch region. There is thoracolumbar levoscoliosis. No adenopathy.  IMPRESSION: Mild cardiac enlargement, stable.  No edema or consolidation.   Electronically Signed   By: Bretta Bang III M.D.   On: 02/15/2015 17:22    ECG  Inferolateral ST depressions (seen on prior EKGs)    ASSESSMENT AND PLAN  1. Chest Pain: As noted above, was unable to elicit any history from the patient at time of assessment given er somnolence, thus making assessment of typical vs atypical chest pain difficult. Regardless, given her abnormal D-dimer and negative LE dopplers, PE needs to be ruled out.  Given AKI, could opt for V/Q scan.  Her  cardiac enzymes are mildly elevated but with a flat trend, making ACS less likely. She also had a recent [facility] 09/2014 which showed only a 50% stenosis in a third branch of RPLB, too distal for PCI. She otherwise had normal coronaries. Her EKG abnormalities with inferolateral ST depressions are not new. Her cardiac enzymes could also be abnormal due to PE. Regardless, agree with starting IV heparin for now until etiology is determined. Would recommend a 2D echo to further assess LVF, wall motion and r/o RV strain. Would avoid further use of morphine given her somnolence and baseline pulmonary function (O2 dependent COPD). Continue ASA, statin, cardioselective BB, amlodipine and Imdur.    Signed, Robbie Lis, PA-C 02/19/2015, 10:18 AM

## 2015-02-19 NOTE — Progress Notes (Signed)
PT Cancellation Note  Patient Details Name: [name] MRN: [id number] DOB: [birth year]   Cancelled Treatment:    Reason Eval/Treat Not Completed: Medical issues which prohibited therapy Somnolence reported this morning in cardiology note.  Work up to rule out PE pending.   [name],[name] 02/19/2015, 1:17 PM [name], PT, DPT 02/19/2015 Pager: [phone]

## 2015-02-19 NOTE — Clinical Social Work Placement (Signed)
CSW provided SNF bed offers to patient & daughter, [name] at bedside. CSW will follow-up with additional bed offers in the morning.     Lincoln Maxin, LCSW [facility] Clinical Social Worker cell #: [phone]    CLINICAL SOCIAL WORK PLACEMENT  NOTE  Date:  02/19/2015  Patient Details  Name: [name] MRN: [id number] Date of Birth: 06/04/1956  Clinical Social Work is seeking post-discharge placement for this patient at the Skilled  Nursing Facility level of care (*CSW will initial, date and re-position this form in  chart as items are completed):  Yes   Patient/family provided with [facility] Clinical Social Work Department's list of facilities offering this level of care within the geographic area requested by the patient (or if unable, by the patient's family).  Yes   Patient/family informed of their freedom to choose among providers that offer the needed level of care, that participate in Medicare, Medicaid or managed care program needed by the patient, have an available bed and are willing to accept the patient.  Yes   Patient/family informed of [facility]'s ownership interest in [facility] and [facility], as well as of the fact that they are under no obligation to receive care at these facilities.  PASRR submitted to EDS on 02/19/15     PASRR number received on 02/19/15     Existing PASRR number confirmed on       FL2 transmitted to all facilities in geographic area requested by pt/family on 02/19/15     FL2 transmitted to all facilities within larger geographic area on       Patient informed that his/her managed care company has contracts with or will negotiate with certain facilities, including the following:        Yes   Patient/family informed of bed offers received.  Patient chooses bed at       Physician recommends and patient chooses bed at      Patient to be transferred to   on  .  Patient to be transferred to facility  by       Patient family notified on   of transfer.  Name of family member notified:        PHYSICIAN       Additional Comment:    _______________________________________________ Arlyss Repress, LCSW 02/19/2015, 4:37 PM

## 2015-02-19 NOTE — Progress Notes (Addendum)
ANTICOAGULATION CONSULT NOTE - Initial Consult  Pharmacy Consult for Heparin Indication: chest pain/ACS  Allergies  Allergen Reactions  . Penicillins Itching and Nausea And Vomiting    Has patient had a PCN reaction causing immediate rash, facial/tongue/throat swelling, SOB or lightheadedness with hypotension: No Has patient had a PCN reaction causing severe rash involving mucus membranes or skin necrosis: No Has patient had a PCN reaction that required hospitalization: No Has patient had a PCN reaction occurring within the last 10 years: No      Patient Measurements: Height: 5' (152.4 cm) Weight: 266 lb 13.4 oz (121.038 kg) IBW/kg (Calculated) : 45.5 Heparin Dosing Weight: 75kg  Vital Signs: Temp: 98.3 F (36.8 C) (09/12 0452) Temp Source: Oral (09/12 0452) BP: 141/49 mmHg (09/12 1016) Pulse Rate: 59 (09/12 1016)  Labs:  Recent Labs  02/17/15 2010 02/18/15 0514 02/18/15 1010 02/18/15 1644  HGB 11.3* 11.1*  --   --   HCT 36.2 34.8*  --   --   PLT 178 163  --   --   APTT  --  25  --   --   LABPROT  --  13.2  --   --   INR  --  0.98  --   --   CREATININE 1.73* 1.10*  --   --   TROPONINI 0.23* 0.23* 0.22* 0.23*    Estimated Creatinine Clearance: 65 mL/min (by C-G formula based on Cr of 1.1).   Medical History: Past Medical History  Diagnosis Date  . Arthritis   . Stroke   . Coronary artery disease     a. remote hx of cath ~2000. B. cath  moderate disease in an RPL branch - too distal for PCI; 3rd RPLB lesion, 50% stenosed; hyperdynamic LV with near cavity obliteration & severely elevated LVEDP  . Asthma   . 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 Non-ischemic cardiomyopathy     cxr - mild cardiomegly, Echo 10/07/14 - severe LVH with LVEF 65-70%.  . Hypertension   . Heart attack   . Chronic bronchitis   . Heart murmur   . Wheelchair bound   . Bilateral chronic knee pain   . Accelerated hypertension      Medications:  Scheduled:  . amLODipine  10 mg Oral Daily  . aspirin EC  81 mg Oral Daily  . atorvastatin  20 mg Oral q1800  . doxazosin  4 mg Oral QHS  . enoxaparin (LOVENOX) injection  40 mg Subcutaneous Q24H  . famotidine  20 mg Oral QHS  . hydrALAZINE  100 mg Oral TID  . isosorbide mononitrate  30 mg Oral Daily  . metoprolol succinate  12.5 mg Oral Daily  . morphine      . nicotine  21 mg Transdermal Daily  . sodium chloride  3 mL Intravenous Q12H   Infusions:    Assessment: 72 yoF with same-day readmission on 9/11 after being for hypertensive urgency.  Presented w/ bilateral leg pain, chest pain, SOB, and elevated D-dimer.  Started on heparin per pharmacy for possible VTE. Subsequent dopplers both negative for VTE and patient transitioned to prophylactic-dose Lovenox.  Today has worsened chest pain and abnormalities on EKG without ST elevation; given NTG x 3.  Pharmacy to restart heparin for ACS.  Cards noted that no CT done so PE not totally ruled out; may get VQ scan d/t recent AKI   Baseline INR, aPTT: wnl  Prior anticoagulation: on IV heparin briefly yesterday, received Lovenox 40 mg ~24 hrs ago  Significant events: See above  Today, 02/19/2015:  CBC: None this AM; CBC yesterday with Low but stable Hgb, Plt wnl  No bleeding or infusion issues per nursing  CrCl: 65 ml/min, AKI mostly resolved  Goal of Therapy: Heparin level 0.3-0.7 units/ml Monitor platelets by anticoagulation protocol: Yes  Plan:  Heparin 3000 units IV bolus x 1  Heparin 1400 units/hr IV infusion  Check heparin level 6 hrs after start  Daily CBC, daily heparin level once stable  Monitor for signs of bleeding or thrombosis   Bernadene Person, PharmD Pager: [phone] 02/19/2015, 10:24 AM

## 2015-02-19 NOTE — Progress Notes (Addendum)
Progress Note   [name] MOL:[id number] DOB: 1955/08/16 DOA: 02/17/2015 PCP: No primary care provider on file.   Brief Narrative:   [name] is an 59 y.o. female with PMH of hypertension, hyperlipidemia, GERD, prior stroke, chronic respiratory failure on 2 L of oxygen at home secondary COPD, morbid obesity/wheelchair bound at baseline, CAD status post stent with chronic elevation of troponins and with recent hospital admission 02/15/15-02/17/15 after treatment for hypertensive urgency who developed bilateral calf pain after discharge and subsequently came back to the ED for further evaluation.  Assessment/Plan:   Principal Problem:   Leg pain/lower extremity weakness - Dopplers negative for DVT. - Plain films of the left knee were negative for acute bony abnormalities (osteoarthritis noted). - Patient appears to be deconditioned and would benefit from SNF. She refused during her previous hospital stay. - Physical therapy and occupational therapy ordered. Likely will need SNF for rehabilitation.  Active Problems:   Chest pain rule out unstable angina - Will give NTG now and repeat EKG. Cycle troponins x 3. BP 174/52. P 88. Oxygen sats 100%. - 12 lead EKG: NSR with PVCs/inferolateral ischemic changes. - Consult cardiology.  Give Morphine for chest pain (in addition to NTG). - Already on Imdur.  Add Metoprolol.    Morbid obesity - Encourage weight loss. BMI 52.2.    Cigarette smoker - Tobacco cessation counseling per nursing. Continue nicotine patch.    GERD - Continue Pepcid.    Osteoarthritis - On Percocet as needed for pain. Plain films of the knee confirm left tricompartmental arthritis.    CAD (coronary artery disease) / chronically elevated troponin / history of stroke - Continue aspirin and statin. Continue nitroglycerin as needed. - Cardiac cath done 10/09/14: Non-obstructive CAD (50% stenosis 3rd branch of RCA). - Troponins consistently elevated 0.2-0.3  since 10/07/14.    HLD (hyperlipidemia) - Continue statin.    Chronic respiratory failure- on home O2 - Continue supplemental oxygen.    Hypertension - Continue Norvasc and Cardura.    AKI (acute kidney injury) - Likely secondary to treatment with ACE inhibitor, diuretics and NSAIDs. - Hold nephrotoxic medications and gently hydrated. Creatinine improved 1.73---> 1.1 with these measures. - Normal saline lock IV.    DVT Prophylaxis - Lovenox ordered.  Family Communication: No family currently at the bedside. Disposition Plan: Child psychotherapist consultation for SNF placement. Code Status:     Code Status Orders        Start     Ordered   02/18/15 0408  Full code   Continuous     02/18/15 0408        IV Access:    Peripheral IV   Procedures and diagnostic studies:   Dg Chest 2 View  02/17/2015   CLINICAL DATA:  59 year old female with shortness of breath  EXAM: CHEST  2 VIEW  COMPARISON:  Radiograph dated 02/15/2015  FINDINGS: Two views of the chest do not demonstrate any focal consolidation, pleural effusion, or pneumothorax. Stable cardiomegaly. The osseous structures are grossly unremarkable.  IMPRESSION: No active cardiopulmonary disease.   Electronically Signed   By: Elgie Collard M.D.   On: 02/17/2015 23:54   Dg Knee 1-2 Views Left  02/18/2015   CLINICAL DATA:  Recurrent left knee pain after a fall on 12/05/2014.  EXAM: LEFT KNEE - 1-2 VIEW  COMPARISON:  12/05/2013  FINDINGS: Prominent tricompartment degenerative changes throughout the left knee. Medial and patellofemoral compartment narrowing with prominent osteophytes in all 3  compartments. No significant effusion. No acute fracture or dislocation. Vascular calcifications.  IMPRESSION: Prominent tricompartment degenerative changes throughout the left knee. No acute bony abnormalities.   Electronically Signed   By: Burman Nieves M.D.   On: 02/18/2015 05:06   US Renal  02/18/2015   CLINICAL DATA:  Acute kidney  injury.  EXAM: RENAL / URINARY TRACT ULTRASOUND COMPLETE  COMPARISON:  CT abdomen and pelvis 02/23/2008. Renal ultrasound 02/19/2008.  FINDINGS: Examination was limited by patient body habitus, with the left kidney particularly not well visualized.  Right Kidney:  Length: 10.0 cm. Echogenicity within normal limits. No mass or hydronephrosis visualized.  Left Kidney:  Length: 10.2 cm.   No gross mass or hydronephrosis visualized.  Bladder:  Appears normal for degree of bladder distention.  IMPRESSION: Partially limited examination due to patient body habitus with suboptimal visualization of the left kidney. No hydronephrosis identified.   Electronically Signed   By: Sebastian Ache M.D.   On: 02/18/2015 15:06     Medical Consultants:    None.  Anti-Infectives:    None.  Subjective:   [name] tells me that she is having sharp pains in her chest, cannot tell me the pain number, says: "All I know is that it is hurting.  When asked if she is short winded, says "I don't know".  Denies cough.  Appetite good.  Bowels moving.  No nausea or vomiting. Says: "Put my oxygen back on".  When asked who took it off, says "I don't know."  Objective:    Filed Vitals:   02/18/15 0632 02/18/15 1305 02/18/15 2206 02/19/15 0452  BP: 163/60 146/56 166/56 182/66  Pulse:  78 68 68  Temp:  97.5 F (36.4 C) 98.4 F (36.9 C) 98.3 F (36.8 C)  TempSrc:  Oral Oral Oral  Resp:  Height:      Weight:      SpO2:  100% 100% 100%    Intake/Output Summary (Last 24 hours) at 02/19/15 0755 Last data filed at 02/19/15 0500  Gross per 24 hour  Intake 1921.25 ml  Output      0 ml  Net 1921.25 ml   Filed Weights   02/18/15 0507  Weight: 121.038 kg (266 lb 13.4 oz)    Exam: Gen:  NAD Cardiovascular:  RRR, No M/R/G Respiratory:  Lungs CTAB Gastrointestinal:  Abdomen soft, NT/ND, + BS Extremities:  Edema noted   Data Reviewed:    Labs: Basic Metabolic Panel:  Recent Labs Lab  02/15/15 1644 02/17/15 2010 02/18/15 0514  NA 144 142 143  K 3.5 3.6 3.5  CL 104 108 111  CO2 GLUCOSE 83 126* 101*  BUN 13 30* 25*  CREATININE 0.71 1.73* 1.10*  CALCIUM 9.8 8.9 8.4*   GFR Estimated Creatinine Clearance: 65 mL/min (by C-G formula based on Cr of 1.1). Liver Function Tests:  Recent Labs Lab 02/15/15 1644 02/18/15 0514  AST 29 20  ALT 16 15  ALKPHOS 77 61  BILITOT 0.6 0.3  PROT 9.1* 6.6  ALBUMIN 4.3 3.5   Coagulation profile  Recent Labs Lab 02/18/15 0514  INR 0.98    CBC:  Recent Labs Lab 02/15/15 1644 02/17/15 2010 02/18/15 0514  WBC 11.2* 9.7 9.3  NEUTROABS 7.7 7.0  --   HGB 15.6* 11.3* 11.1*  HCT 47.8* 36.2 34.8*  MCV 81.4 81.9 82.7  PLT 187 178 163   Cardiac Enzymes:  Recent Labs Lab 02/15/15 1644  02/16/15 0300 02/16/15 0935 02/17/15 2010 02/18/15 0514 02/18/15 1010 02/18/15 1644  CKTOTAL 347*  --  281*  --   --   --   --   --   TROPONINI 0.28*  < > 0.25* 0.26* 0.23* 0.23* 0.22* 0.23*  < > = values in this interval not displayed. CBG:  Recent Labs Lab 02/18/15 0742  GLUCAP 92   D-Dimer:  Recent Labs  02/17/15 2010  DDIMER 1.49*   Sepsis Labs:  Recent Labs Lab 02/15/15 1644 02/15/15 1714 02/17/15 2010 02/18/15 0514  WBC 11.2*  --  9.7 9.3  LATICACIDVEN  --  2.05*  --   --    Microbiology Recent Results (from the past 240 hour(s))  Urine culture     Status: None   Collection Time: 02/15/15  4:15 PM  Result Value Ref Range Status   Specimen Description URINE, CLEAN CATCH  Final   Special Requests NONE  Final   Culture   Final    MULTIPLE SPECIES PRESENT, SUGGEST RECOLLECTION Performed at [facility]    Report Status 02/17/2015 FINAL  Final     Medications:   . amLODipine  10 mg Oral Daily  . aspirin EC  81 mg Oral Daily  . atorvastatin  20 mg Oral q1800  . doxazosin  4 mg Oral QHS  . enoxaparin (LOVENOX) injection  40 mg Subcutaneous Q24H  . famotidine  20 mg Oral QHS  .  hydrALAZINE  100 mg Oral TID  . isosorbide mononitrate  30 mg Oral Daily  . nicotine  21 mg Transdermal Daily  . sodium chloride  3 mL Intravenous Q12H   Continuous Infusions:    Time spent: 35 minutes face to face time.  The patient is medically complex and requires high complexity decision making with active chest pain.   LOS: 1 day   Alcides Nutting  [facility] Pager [phone]. If unable to reach me by pager, please call my cell phone at [phone].  *Please refer to [email or web address], password [email or web address] to get updated schedule on who will round on this patient, as hospitalists switch teams weekly. If 7PM-7AM, please contact night-coverage at [email or web address], password [email or web address] for any overnight needs.  02/19/2015, 7:55 AM

## 2015-02-19 NOTE — Clinical Social Work Note (Signed)
Clinical Social Work Assessment  Patient Details  Name: [name] MRN: [id number] Date of Birth: [birth year]  Date of referral:  02/19/15               Reason for consult:  Facility Placement                Permission sought to share information with:  Oceanographer granted to share information::  Yes, Verbal Permission Granted  Name::        Agency::     Relationship::     Contact Information:     Housing/Transportation Living arrangements for the past 2 months:  Single Family Home Source of Information:  Patient, Adult Children Patient Interpreter Needed:  None Criminal Activity/Legal Involvement Pertinent to Current Situation/Hospitalization:  No - Comment as needed Significant Relationships:  Adult Children Lives with:  Self Do you feel safe going back to the place where you live?  No Need for family participation in patient care:  Yes (Comment)  Care giving concerns:  CSW received consult for SNF placement from Dr. Darnelle Catalan.    Social Worker assessment / plan:  CSW spoke with patient & daughter, [name] at bedside - daughter is encouraging patient to go to SNF at discharge as she does not feel patient will be safe to return home alone at discharge.   Employment status:    Insurance information:  Medicaid In [place] PT Recommendations:  Not assessed at this time Information / Referral to community resources:  Skilled Nursing Facility  Patient/Family's Response to care:  Patient was quiet throughout assessment, but seemed agreeable with plan for SNF. CSW awaiting SNF bed offers - currently only [facility] have offered beds though daughter states that she would like to tour facilities before making a decision.   Patient/Family's Understanding of and Emotional Response to Diagnosis, Current Treatment, and Prognosis:  Patient's daughter is concerned about patient living alone.   Emotional Assessment Appearance:  Appears stated  age Attitude/Demeanor/Rapport:    Affect (typically observed):  Calm, Pleasant, Quiet Orientation:  Oriented to Self, Oriented to Place, Oriented to  Time, Oriented to Situation Alcohol / Substance use:    Psych involvement (Current and /or in the community):     Discharge Needs  Concerns to be addressed:    Readmission within the last 30 days:    Current discharge risk:    Barriers to Discharge:      Arlyss Repress, LCSW 02/19/2015, 4:28 PM

## 2015-02-20 ENCOUNTER — Inpatient Hospital Stay (HOSPITAL_COMMUNITY): Payer: Medicaid Other

## 2015-02-20 DIAGNOSIS — R609 Edema, unspecified: Secondary | ICD-10-CM

## 2015-02-20 DIAGNOSIS — I517 Cardiomegaly: Secondary | ICD-10-CM

## 2015-02-20 DIAGNOSIS — I25118 Atherosclerotic heart disease of native coronary artery with other forms of angina pectoris: Secondary | ICD-10-CM

## 2015-02-20 DIAGNOSIS — R079 Chest pain, unspecified: Secondary | ICD-10-CM

## 2015-02-20 LAB — BASIC METABOLIC PANEL
ANION GAP: 8 (ref 5–15)
BUN: 21 mg/dL — ABNORMAL HIGH (ref 6–20)
CALCIUM: 8.5 mg/dL — AB (ref 8.9–10.3)
CO2: 22 mmol/L (ref 22–32)
Chloride: 108 mmol/L (ref 101–111)
Creatinine, Ser: 0.84 mg/dL (ref 0.44–1.00)
Glucose, Bld: 100 mg/dL — ABNORMAL HIGH (ref 65–99)
POTASSIUM: 4 mmol/L (ref 3.5–5.1)
Sodium: 138 mmol/L (ref 135–145)

## 2015-02-20 LAB — CBC
HCT: 33.9 % — ABNORMAL LOW (ref 36.0–46.0)
HEMOGLOBIN: 10.5 g/dL — AB (ref 12.0–15.0)
MCH: 25.5 pg — ABNORMAL LOW (ref 26.0–34.0)
MCHC: 31 g/dL (ref 30.0–36.0)
MCV: 82.5 fL (ref 78.0–100.0)
Platelets: 149 10*3/uL — ABNORMAL LOW (ref 150–400)
RBC: 4.11 MIL/uL (ref 3.87–5.11)
RDW: 17.2 % — ABNORMAL HIGH (ref 11.5–15.5)
WBC: 8.4 10*3/uL (ref 4.0–10.5)

## 2015-02-20 LAB — TROPONIN I: TROPONIN I: 0.21 ng/mL — AB (ref <0.031)

## 2015-02-20 MED ORDER — VITAMINS A & D EX OINT
TOPICAL_OINTMENT | CUTANEOUS | Status: AC
  Administered 2015-02-20: 1
  Filled 2015-02-20: qty 5

## 2015-02-20 MED ORDER — PERFLUTREN LIPID MICROSPHERE
1.0000 mL | INTRAVENOUS | Status: AC | PRN
  Administered 2015-02-20: 2 mL via INTRAVENOUS
  Filled 2015-02-20: qty 10

## 2015-02-20 MED ORDER — METOPROLOL SUCCINATE ER 25 MG PO TB24: 25.0000 mg | ORAL_TABLET | Freq: Every day | ORAL | Status: DC

## 2015-02-20 MED ORDER — ISOSORBIDE MONONITRATE ER 60 MG PO TB24
60.0000 mg | ORAL_TABLET | Freq: Every day | ORAL | Status: DC
  Administered 2015-02-20: 60 mg via ORAL
  Filled 2015-02-20: qty 1

## 2015-02-20 MED ORDER — PERFLUTREN LIPID MICROSPHERE
INTRAVENOUS | Status: AC
  Filled 2015-02-20: qty 10

## 2015-02-20 MED ORDER — ISOSORBIDE MONONITRATE ER 30 MG PO TB24
30.0000 mg | ORAL_TABLET | Freq: Every day | ORAL | Status: DC
  Administered 2015-02-21 – 2015-02-22 (×2): 30 mg via ORAL
  Filled 2015-02-20 (×2): qty 1

## 2015-02-20 NOTE — Progress Notes (Signed)
Progress Note   [name] LKG:[id number] DOB: 11-Dec-1955 DOA: 02/17/2015 PCP: No primary care provider on file.   Brief Narrative:   [name] is an 59 y.o. female with PMH of hypertension, hyperlipidemia, GERD, prior stroke, chronic respiratory failure on 2 L of oxygen at home secondary COPD, morbid obesity/wheelchair bound at baseline, CAD status post stent with chronic elevation of troponins and with recent hospital admission 02/15/15-02/17/15 after treatment for hypertensive urgency who developed bilateral calf pain after discharge and subsequently came back to the ED for further evaluation.  Assessment/Plan:   Principal Problem:   Leg pain/lower extremity weakness - Dopplers negative for DVT. - Plain films of the left knee were negative for acute bony abnormalities (osteoarthritis noted). - Patient appears to be deconditioned and would benefit from SNF. She refused during her previous hospital stay. - Physical therapy and occupational therapy ordered. Likely will need SNF for rehabilitation.  Active Problems:   Chest pain rule out unstable angina - Developed acute chest pain 02/19/15.  - 12 lead EKG: NSR with PVCs/inferolateral ischemic changes. CT of the chest negative for pulmonary embolism. - Cardiology following.  Cardiac MRI planned. 2-D echo done. Restrictive filling with elevated LV filling pressure and severe LVH. - Rule out amyloid/sarcoid. - Already on Imdur.  Metoprolol added 02/19/15.    Morbid obesity - Encourage weight loss. BMI 52.2.    Cigarette smoker - Tobacco cessation counseling per nursing. Continue nicotine patch.    GERD - Continue Pepcid.    Osteoarthritis - On Percocet as needed for pain. Plain films of the knee confirm left tricompartmental arthritis.    CAD (coronary artery disease) / chronically elevated troponin / history of stroke - Continue aspirin, beta blocker and statin. Continue nitroglycerin as needed. - Cardiac cath done  10/09/14: Non-obstructive CAD (50% stenosis 3rd branch of RCA). - Troponins consistently elevated 0.2-0.3 since 10/07/14.    HLD (hyperlipidemia) - Continue statin.    Chronic respiratory failure- on home O2 - Continue supplemental oxygen.    Hypertension - Continue Norvasc, metoprolol and Cardura.    AKI (acute kidney injury) - Likely secondary to treatment with ACE inhibitor, diuretics and NSAIDs. - Creatinine back to baseline values after hydration.    DVT Prophylaxis - Lovenox ordered.  Family Communication: No family currently at the bedside. Disposition Plan: Child psychotherapist consultation for SNF placement. Code Status:     Code Status Orders        Start     Ordered   02/18/15 0408  Full code   Continuous     02/18/15 0408        IV Access:    Peripheral IV   Procedures and diagnostic studies:   Ct Angio Chest Pe W/cm &/or Wo Cm  02/19/2015   CLINICAL DATA:  Shortness of breath, chest pain  EXAM: CT ANGIOGRAPHY CHEST WITH CONTRAST  TECHNIQUE: Multidetector CT imaging of the chest was performed using the standard protocol during bolus administration of intravenous contrast. Multiplanar CT image reconstructions and MIPs were obtained to evaluate the vascular anatomy.  CONTRAST:  OMNIPAQUE IOHEXOL 350 MG/ML SOLN  COMPARISON:  None.  FINDINGS: Left arm contrast injection. Innominate vein and SVC patent. The right ventricle appears nondilated. Satisfactory opacification of pulmonary arteries noted, and there is no evidence of pulmonary emboli. Patent pulmonary veins bilaterally. Left ventricular hypertrophy. Patchy coronary calcifications. Adequate contrast opacification of the thoracic aorta with no evidence of dissection, aneurysm, or stenosis. There is classic  3-vessel brachiocephalic arch anatomy without proximal stenosis. Patchy calcifications in the aortic arch.  Trace bilateral pleural effusions left greater than right and trace pericardial effusion. No hilar or  mediastinal adenopathy. Atelectasis/consolidation posteriorly in the lower lobes left greater than right. Poorly marginated patchy airspace opacity in the right upper lobe centrally. Patient motion during the acquisition degrades parenchymal evaluation. Flowing osteophytes across multiple levels in the mid and lower thoracic spine. Sternum intact. Visualized portions of upper abdomen unremarkable.  Review of the MIP images confirms the above findings.  IMPRESSION: 1. Negative for acute PE or thoracic aortic dissection. 2. Left ventricular hypertrophy. 3. Atherosclerosis, including aortic and coronary artery disease. Please note that although the presence of coronary artery calcium documents the presence of coronary artery disease, the severity of this disease and any potential stenosis cannot be assessed on this non-gated CT examination. Assessment for potential risk factor modification, dietary therapy or pharmacologic therapy may be warranted, if clinically indicated. 4. Small pleural and pericardial effusions.   Electronically Signed   By: Corlis Leak M.D.   On: 02/19/2015 16:18   US Renal  02/18/2015   CLINICAL DATA:  Acute kidney injury.  EXAM: RENAL / URINARY TRACT ULTRASOUND COMPLETE  COMPARISON:  CT abdomen and pelvis 02/23/2008. Renal ultrasound 02/19/2008.  FINDINGS: Examination was limited by patient body habitus, with the left kidney particularly not well visualized.  Right Kidney:  Length: 10.0 cm. Echogenicity within normal limits. No mass or hydronephrosis visualized.  Left Kidney:  Length: 10.2 cm.   No gross mass or hydronephrosis visualized.  Bladder:  Appears normal for degree of bladder distention.  IMPRESSION: Partially limited examination due to patient body habitus with suboptimal visualization of the left kidney. No hydronephrosis identified.   Electronically Signed   By: Sebastian Ache M.D.   On: 02/18/2015 15:06   2 D Echo 02/20/15  Impressions:  - Normal LV systolic function;  restrictive filling with elevated LV filling pressure; severe LVH; mild LAE; small pericardial effusion; myocardium with speckled appearance; consider amyloid.  Medical Consultants:    Cardiology: Quintella Reichert, MD  Anti-Infectives:    None.  Subjective:   [name] denies current chest pain or dyspnea. Says she thought that the sublingual nitroglycerin relieved her pain yesterday after 3 doses. Continues to feel weak. Appetite good. No nausea or vomiting.  Objective:    Filed Vitals:   02/19/15 1257 02/19/15 1614 02/19/15 2141 02/20/15 0700  BP: 149/48 127/46 150/50 182/65  Pulse: 74  69 71  Temp: 98.3 F (36.8 C)  98.8 F (37.1 C) 98.5 F (36.9 C)  TempSrc: Oral  Oral Oral  Resp: 19  20 20   Height:      Weight:      SpO2: 100%  100% 99%    Intake/Output Summary (Last 24 hours) at 02/20/15 0744 Last data filed at 02/20/15 0200  Gross per 24 hour  Intake  654.2 ml  Output      0 ml  Net  654.2 ml   Filed Weights   02/18/15 0507  Weight: 121.038 kg (266 lb 13.4 oz)    Exam: Gen:  NAD Cardiovascular:  RRR, No M/R/G Respiratory:  Lungs CTAB Gastrointestinal:  Abdomen soft, NT/ND, + BS Extremities:  Edema noted   Data Reviewed:    Labs: Basic Metabolic Panel:  Recent Labs Lab 02/15/15 1644 02/17/15 2010 02/18/15 0514 02/19/15 1130 02/20/15 0159  NA 144 142 143 142 138  K 3.5 3.6 3.5 3.5 4.0  CL 104 108 111 109 108  CO2 29 25 24 26 22   GLUCOSE 83 126* 101* 109* 100*  BUN 13 30* 25* 19 21*  CREATININE 0.71 1.73* 1.10* 0.99 0.84  CALCIUM 9.8 8.9 8.4* 9.1 8.5*   GFR Estimated Creatinine Clearance: 85.1 mL/min (by C-G formula based on Cr of 0.84). Liver Function Tests:  Recent Labs Lab 02/15/15 1644 02/18/15 0514  AST 29 20  ALT 16 15  ALKPHOS 77 61  BILITOT 0.6 0.3  PROT 9.1* 6.6  ALBUMIN 4.3 3.5   Coagulation profile  Recent Labs Lab 02/18/15 0514  INR 0.98    CBC:  Recent Labs Lab 02/15/15 1644  02/17/15 2010 02/18/15 0514 02/20/15 0159  WBC 11.2* 9.7 9.3 8.4  NEUTROABS 7.7 7.0  --   --   HGB 15.6* 11.3* 11.1* 10.5*  HCT 47.8* 36.2 34.8* 33.9*  MCV 81.4 81.9 82.7 82.5  PLT 187 178 163 149*   Cardiac Enzymes:  Recent Labs Lab 02/15/15 1644  02/16/15 0300  02/18/15 1010 02/18/15 1644 02/19/15 1129 02/19/15 1850 02/20/15 0159  CKTOTAL 347*  --  281*  --   --   --   --   --   --   TROPONINI 0.28*  < > 0.25*  < > 0.22* 0.23* 0.24* 0.22* 0.21*  < > = values in this interval not displayed. CBG:  Recent Labs Lab 02/18/15 0742  GLUCAP 92   D-Dimer:  Recent Labs  02/17/15 2010  DDIMER 1.49*   Sepsis Labs:  Recent Labs Lab 02/15/15 1644 02/15/15 1714 02/17/15 2010 02/18/15 0514 02/20/15 0159  WBC 11.2*  --  9.7 9.3 8.4  LATICACIDVEN  --  2.05*  --   --   --    Microbiology Recent Results (from the past 240 hour(s))  Urine culture     Status: None   Collection Time: 02/15/15  4:15 PM  Result Value Ref Range Status   Specimen Description URINE, CLEAN CATCH  Final   Special Requests NONE  Final   Culture   Final    MULTIPLE SPECIES PRESENT, SUGGEST RECOLLECTION Performed at [facility]    Report Status 02/17/2015 FINAL  Final     Medications:   . amLODipine  10 mg Oral Daily  . aspirin EC  81 mg Oral Daily  . atorvastatin  20 mg Oral q1800  . doxazosin  4 mg Oral QHS  . famotidine  20 mg Oral QHS  . heparin subcutaneous  5,000 Units Subcutaneous 3 times per day  . hydrALAZINE  100 mg Oral TID  . isosorbide mononitrate  30 mg Oral Daily  . metoprolol succinate  12.5 mg Oral Daily  . nicotine  21 mg Transdermal Daily  . sodium chloride  3 mL Intravenous Q12H   Continuous Infusions:    Time spent: 35 minutes.  The patient is medically complex and requires high complexity decision making and coordination of care with cardiology and social work.    LOS: 2 days   RAMA,CHRISTINA  [facility] Pager [phone]. If unable to  reach me by pager, please call my cell phone at [phone].  *Please refer to [email or web address], password [email or web address] to get updated schedule on who will round on this patient, as hospitalists switch teams weekly. If 7PM-7AM, please contact night-coverage at [email or web address], password [email or web address] for any overnight needs.  02/20/2015, 7:44 AM

## 2015-02-20 NOTE — Progress Notes (Signed)
[facility] has been arranged to have patient over at MRI at 9:00am for procedure.  Spoke with Crystal in MRI and she confirmed her appointment for 9:00am on Wednesday 02/21/2015.  [facility] will pick up at 8:30am at [facility]

## 2015-02-20 NOTE — Progress Notes (Signed)
Patient Profile: 59 y/o obese female with h/o nonobstructive CAD, O2 dependent COPD, HTN and h/o medication noncompliance admitted for chest pain eval. D-dimer elevated but CT of chest negative for PE and dissection. LE dopplers negative for DVT. Flat low level cardiac enzymes (0.24, 0.22, 0.21). 2D echo pending.   Subjective: Currently CP free. She notes SL NTG relieved her pain. She is currently getting 2D echo.  Objective: Vital signs in last 24 hours: Temp:  [98.3 F (36.8 C)-98.8 F (37.1 C)] 98.5 F (36.9 C) (09/13 0700) Pulse Rate:  [58-88] 71 (09/13 0700) Resp:  [19-20] 20 (09/13 0700) BP: (116-182)/(42-65) 182/65 mmHg (09/13 0700) SpO2:  [99 %-100 %] 99 % (09/13 0700) Last BM Date: 02/19/15  Intake/Output from previous day: 09/12 0701 - 09/13 0700 In: 654.2 [P.O.:480; I.V.:174.2] Out: -  Intake/Output this shift:    Medications Current Facility-Administered Medications  Medication Dose Route Frequency Provider Last Rate Last Dose  . acetaminophen (TYLENOL) tablet 650 mg  650 mg Oral Q6H PRN Lorretta Harp, MD       Or  . acetaminophen (TYLENOL) suppository 650 mg  650 mg Rectal Q6H PRN Lorretta Harp, MD      . albuterol (PROVENTIL) (2.5 MG/3ML) 0.083% nebulizer solution 2.5 mg  2.5 mg Nebulization Q4H PRN Lorretta Harp, MD      . alum & mag hydroxide-simeth (MAALOX/MYLANTA) 200-200-20 MG/5ML suspension 30 mL  30 mL Oral Q6H PRN Lorretta Harp, MD      . amLODipine (NORVASC) tablet 10 mg  10 mg Oral Daily Lorretta Harp, MD   10 mg at 02/19/15 0914  . aspirin EC tablet 81 mg  81 mg Oral Daily Lorretta Harp, MD   81 mg at 02/19/15 0914  . atorvastatin (LIPITOR) tablet 20 mg  20 mg Oral q1800 Lorretta Harp, MD   20 mg at 02/19/15 1726  . doxazosin (CARDURA) tablet 4 mg  4 mg Oral QHS Lorretta Harp, MD   4 mg at 02/19/15 2146  . famotidine (PEPCID) tablet 20 mg  20 mg Oral QHS Lorretta Harp, MD   20 mg at 02/19/15 2146  . heparin injection 5,000 Units  5,000 Units Subcutaneous 3 times per day Quintella Reichert, MD   5,000 Units at 02/20/15 0602  . hydrALAZINE (APRESOLINE) injection 5 mg  5 mg Intravenous Q2H PRN Lorretta Harp, MD   5 mg at 02/19/15 0552  . hydrALAZINE (APRESOLINE) tablet 100 mg  100 mg Oral TID Lorretta Harp, MD   100 mg at 02/19/15 2146  . isosorbide mononitrate (IMDUR) 24 hr tablet 30 mg  30 mg Oral Daily Lorretta Harp, MD   30 mg at 02/19/15 0914  . metoprolol succinate (TOPROL-XL) 24 hr tablet 12.5 mg  12.5 mg Oral Daily Maryruth Bun Rama, MD   12.5 mg at 02/19/15 1301  . morphine 2 MG/ML injection 2 mg  2 mg Intravenous Q4H PRN Lorretta Harp, MD      . nicotine (NICODERM CQ - dosed in mg/24 hours) patch 21 mg  21 mg Transdermal Daily Lorretta Harp, MD   21 mg at 02/19/15 0913  . nitroGLYCERIN (NITROSTAT) SL tablet 0.4 mg  0.4 mg Sublingual Q5 min PRN Cheri Fowler, PA-C   0.4 mg at 02/19/15 1013  . oxyCODONE-acetaminophen (PERCOCET/ROXICET) 5-325 MG per tablet 2 tablet  2 tablet Oral Q6H PRN Lorretta Harp, MD   2 tablet at 02/18/15 2248  . sodium chloride 0.9 % injection 3 mL  3 mL Intravenous  Q12H Lorretta Harp, MD   3 mL at 02/19/15 2146    PE: General appearance: alert, cooperative, no distress and morbidly obese Neck: no carotid bruit and no JVD Lungs: clear to auscultation bilaterally Heart: regular rate and rhythm, S1, S2 normal, no murmur, click, rub or gallop Extremities: trace bilateral LEE Pulses: 2+ and symmetric Skin: warm and dry Neurologic: Grossly normal  Lab Results:   Recent Labs  02/17/15 2010 02/18/15 0514 02/20/15 0159  WBC 9.7 9.3 8.4  HGB 11.3* 11.1* 10.5*  HCT 36.2 34.8* 33.9*  PLT 178 163 149*   BMET  Recent Labs  02/18/15 0514 02/19/15 1130 02/20/15 0159  NA 143 142 138  K 3.5 3.5 4.0  CL 111 109 108  CO2 GLUCOSE 101* 109* 100*  BUN 25* 19 21*  CREATININE 1.10* 0.99 0.84  CALCIUM 8.4* 9.1 8.5*   PT/INR  Recent Labs  02/18/15 0514  LABPROT 13.2  INR 0.98   Cardiac Panel (last 3 results)  Recent Labs  02/19/15 1129 02/19/15 1850  02/20/15 0159  TROPONINI 0.24* 0.22* 0.21*    Studies/Results: CT of Chest 02/19/15 IMPRESSION: 1. Negative for acute PE or thoracic aortic dissection. 2. Left ventricular hypertrophy. 3. Atherosclerosis, including aortic and coronary artery disease. Please note that although the presence of coronary artery calcium documents the presence of coronary artery disease, the severity of this disease and any potential stenosis cannot be assessed on this non-gated CT examination. Assessment for potential risk factor modification, dietary therapy or pharmacologic therapy may be warranted, if clinically indicated. 4. Small pleural and pericardial effusions.  2D Echo - pending  Assessment/Plan  Principal Problem:   Leg pain Active Problems:   Morbid obesity   Cigarette smoker   GERD   Osteoarthritis   CAD (coronary artery disease)   Stroke   HLD (hyperlipidemia)   Chronic respiratory failure- on home O2   Elevated troponin   Hypertension   AKI (acute kidney injury)   Chest pain   1. Chest Pain: currently CP free. CT of chest negative for acute PE and dissection. The presence of atherosclerosis, including aortic and coronary artery disease was mentioned in report. 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 This was far too distal to consider PCI. She had otherwise normal coronary arteries.  Her troponin's are also low level with a flat trend making ACS less likely. 2D echo showed normal LV systolic function; restrictive filling with elevated LV filling pressure; severe LVH; mild LAE; small pericardial effusion; myocardium with speckled appearance; consider amyloid- Consider cardiac MRI to evaluate further for amyloid but one thing that goes against this diagnosis is that voltage is normal on EKG and usually there is low voltage throughout.  Suspect that her CP and abnormal troponin may be secondary to severe HTN. She notes SL NTG relieved  her pain. Cath in April showed near cavity obliteration of LV in systole and EF on echo in 65-70% with severe LVH so would avoid increasing nitrates further which could drop preload.  Increase Toprol to  daily.    2. HTN: her BP is still elevated in the 180s systolic. She notes her pain was relieved with SL NTG. Will increase her Torpol to  daily for CP and HTN. Continue amlodipine/cardura/hydralazine as well.    LOS: 2 days    Brittainy M. Delmer Islam 02/20/2015 7:45 AM  Patient seen and examined with Robbie Lis, PA. We discussed all aspects  of the encounter. I agree with the assessment and plan as stated above with some changes.  2D echo with severe LVH and speckled pattern worrisome for Amyloid but EKG with no low voltage making this diagnosis less likely. Will discuss with Dr. Delton See utility of cardiac MRI for further assessment.   Will increase Toprol for diastolic dysfunction as well as better BP control.  No wall motion abnormalities on echo so Troponin bump most likely demand ischemia from HTN especially with recent cath showing 50% RPLB and otherwise normal coronary arteries.  No further ischemic w/u needed.    Signed: Armanda Magic, MD [facility] 02/20/2015

## 2015-02-20 NOTE — Progress Notes (Signed)
  Echocardiogram 2D Echocardiogram with Definity has been performed.  Nolon Rod 02/20/2015, 9:52 AM

## 2015-02-20 NOTE — Clinical Social Work Placement (Signed)
CSW spoke with patient's daughter, [name] to confirm that they have accepted bed at [facility]. CSW reviewed nurses note indicating that patient will be getting MRI in the morning. Anticipating possible discharge tomorrow.     Lincoln Maxin, LCSW [facility] Clinical Social Worker cell #: [phone]    CLINICAL SOCIAL WORK PLACEMENT  NOTE  Date:  02/20/2015  Patient Details  Name: MADA LAZARIN MRN: [id number] Date of Birth: 01-11-1956  Clinical Social Work is seeking post-discharge placement for this patient at the Skilled  Nursing Facility level of care (*CSW will initial, date and re-position this form in  chart as items are completed):  Yes   Patient/family provided with [facility] Clinical Social Work Department's list of facilities offering this level of care within the geographic area requested by the patient (or if unable, by the patient's family).  Yes   Patient/family informed of their freedom to choose among providers that offer the needed level of care, that participate in Medicare, Medicaid or managed care program needed by the patient, have an available bed and are willing to accept the patient.  Yes   Patient/family informed of [facility]'s ownership interest in [facility] and [facility], as well as of the fact that they are under no obligation to receive care at these facilities.  PASRR submitted to EDS on 02/19/15     PASRR number received on 02/19/15     Existing PASRR number confirmed on       FL2 transmitted to all facilities in geographic area requested by pt/family on 02/19/15     FL2 transmitted to all facilities within larger geographic area on       Patient informed that his/her managed care company has contracts with or will negotiate with certain facilities, including the following:        Yes   Patient/family informed of bed offers received.  Patient chooses bed at [facility]     Physician recommends and patient chooses bed at      Patient to be transferred to [facility] on  .  Patient to be transferred to facility by       Patient family notified on   of transfer.  Name of family member notified:        PHYSICIAN       Additional Comment:    _______________________________________________ Arlyss Repress, LCSW 02/20/2015, 2:51 PM

## 2015-02-20 NOTE — Progress Notes (Signed)
-  Today, patient stated she takes Nitroglycerin tablets at home when she has pain in her legs -Educated the patient that the recommended use of Nitroglycerin is for chest pain only, not for any other pain -Education about the use of Nitroglycerin needs to be reinforced before patient discharge.  -Will report to oncoming nurse to follow up with patient

## 2015-02-20 NOTE — Evaluation (Signed)
Occupational Therapy Evaluation Patient Details Name: [name] MRN: [id number] DOB: 22-Sep-1955 Today's Date: 02/20/2015    History of Present Illness 59 y.o. female with PMH of hypertension, hyperlipidemia, GERD, prior stroke, prior MI, chronic respiratory failure on 2 L of oxygen at home secondary COPD, morbid obesity, CAD status post stent with chronic elevation of troponins and with recent hospital admission 02/15/15-02/17/15 after treatment for hypertensive urgency who developed bilateral calf pain after discharge and subsequently came back 9/10 to the ED for further evaluation.  Dopplers negative for DVT and CTA negative for PE.   Clinical Impression   Pt was admitted for the above.  At baseline, she has assistance for shoes/socks and meals. She lived alone but daughter came by to assist.  Pt will benefit from skilled OT in acute.  Goals in this setting are for min guard overall and she currently needs mod to max A for LB adls.      Follow Up Recommendations  SNF    Equipment Recommendations  None recommended by OT    Recommendations for Other Services       Precautions / Restrictions Precautions Precautions: Fall Precaution Comments: chronic 2L O2 Restrictions Weight Bearing Restrictions: No      Mobility Bed Mobility Overal bed mobility: Needs Assistance Bed Mobility: Supine to Sit;Sit to Supine     Supine to sit: Min assist Sit to supine: Min assist   General bed mobility comments: assist for trunk to sit up;LEs assisted onto bed  Transfers Overall transfer level: Needs assistance Equipment used: Rolling walker (2 wheeled) Transfers: Sit to/from Stand Sit to Stand: Min assist         General transfer comment: verbal cues for hand placement, pt able to march in place and then take a few side steps up HOB    Balance Overall balance assessment: Needs assistance         Standing balance support: Bilateral upper extremity supported Standing  balance-Leahy Scale: Poor Standing balance comment: requires UE support                            ADL Overall ADL's : Needs assistance/impaired             Lower Body Bathing: Moderate assistance;Sit to/from stand       Lower Body Dressing: Maximal assistance;Sit to/from stand                 General ADL Comments: pt is able to perform UB adls with set up.  She stood with min A and sidestepped up HOB.  Did not need to use BSC during this visit. Would benefit from AE for ADLs.  Pt tends to hold breath; cues given.  She had forgotten to put 02 back on--states that she wears this 24/7     Vision     Perception     Praxis      Pertinent Vitals/Pain Pain Assessment: 0-10 Pain Score: 6  Pain Location: bil LEs Pain Descriptors / Indicators: Sore;Aching Pain Intervention(s): Limited activity within patient's tolerance;Monitored during session;Patient requesting pain meds-RN notified     Hand Dominance     Extremity/Trunk Assessment Upper Extremity Assessment Upper Extremity Assessment: Overall WFL for tasks assessed          Communication Communication Communication: No difficulties   Cognition Arousal/Alertness: Awake/alert Behavior During Therapy: WFL for tasks assessed/performed Overall Cognitive Status: Within Functional Limits for tasks assessed  General Comments       Exercises       Shoulder Instructions      Home Living Family/patient expects to be discharged to:: Skilled nursing facility Living Arrangements: Alone                               Additional Comments: has BSC and tub seat.        Prior Functioning/Environment Level of Independence: Needs assistance  Gait / Transfers Assistance Needed: states she has w/c which does not properly fit, short distance ambulator only  ADL's / Homemaking Assistance Needed: Pt reports she needs assist to complete ADL tasks        OT  Diagnosis: Generalized weakness;Acute pain   OT Problem List: Decreased strength;Decreased activity tolerance;Impaired balance (sitting and/or standing);Decreased knowledge of use of DME or AE;Pain   OT Treatment/Interventions: Self-care/ADL training;DME and/or AE instruction;Patient/family education;Therapeutic activities;Energy conservation    OT Goals(Current goals can be found in the care plan section) Acute Rehab OT Goals Patient Stated Goal: decreased pain; get strength back OT Goal Formulation: With patient Time For Goal Achievement: 02/27/15 Potential to Achieve Goals: Good ADL Goals Pt Will Transfer to Toilet: with min guard assist;bedside commode;stand pivot transfer Pt Will Perform Toileting - Clothing Manipulation and hygiene: with min guard assist;sit to/from stand (vs set up; lateral leans) Additional ADL Goal #1: pt will complete LB adls with set up/supervision with AE with min guard assistance  OT Frequency: Min 2X/week   Barriers to D/C:            Co-evaluation PT/OT/SLP Co-Evaluation/Treatment: Yes Reason for Co-Treatment: Complexity of the patient's impairments (multi-system involvement);For patient/therapist safety PT goals addressed during session: Mobility/safety with mobility;Proper use of DME OT goals addressed during session: ADL's and self-care      End of Session    Activity Tolerance: Patient limited by fatigue Patient left: in bed;with call bell/phone within reach;with bed alarm set   Time: 1435-1455 OT Time Calculation (min): 20 min Charges:  OT General Charges $OT Visit: 1 Procedure OT Evaluation $Initial OT Evaluation Tier I: 1 Procedure G-Codes:    Joniece Smotherman 02-26-15, 3:23 PM   Marica Otter, OTR/L [phone] 2015/02/26

## 2015-02-20 NOTE — Evaluation (Signed)
Physical Therapy Evaluation Patient Details Name: [name] MRN: [id number] DOB: 13-Aug-1955 Today's Date: 02/20/2015   History of Present Illness  59 y.o. female with PMH of hypertension, hyperlipidemia, GERD, prior stroke, prior MI, chronic respiratory failure on 2 L of oxygen at home secondary COPD, morbid obesity, CAD status post stent with chronic elevation of troponins and with recent hospital admission 02/15/15-02/17/15 after treatment for hypertensive urgency who developed bilateral calf pain after discharge and subsequently came back 9/10 to the ED for further evaluation.  Dopplers negative for DVT and CTA negative for PE.  Clinical Impression  Pt admitted with above diagnosis. Pt currently with functional limitations due to the deficits listed below (see PT Problem List).   Pt will benefit from skilled PT to increase their independence and safety with mobility to allow discharge to the venue listed below.  Pt reports she is typically only short distance ambulator at home and typically sits in recliner.  Pt reports she was taking sublingual nitroglycerin to ease her LEG pain at home; she denies taking any other pain meds.  RN notified of this and also states she received 2 percocets prior to therapy session.  Pt may benefit from ST-SNF prior to d/c home.     Follow Up Recommendations SNF    Equipment Recommendations  Wheelchair (measurements PT)    Recommendations for Other Services       Precautions / Restrictions Precautions Precautions: Fall Precaution Comments: chronic 2L O2 Restrictions Weight Bearing Restrictions: No      Mobility  Bed Mobility Overal bed mobility: Needs Assistance Bed Mobility: Supine to Sit;Sit to Supine     Supine to sit: Min assist Sit to supine: Min assist   General bed mobility comments: assist for trunk to sit up;LEs assisted onto bed  Transfers Overall transfer level: Needs assistance Equipment used: Rolling walker (2  wheeled) Transfers: Sit to/from Stand Sit to Stand: Min assist         General transfer comment: verbal cues for hand placement, pt able to march in place and then take a few side steps up [facility]  Ambulation/Gait                Stairs            Wheelchair Mobility    Modified Rankin (Stroke Patients Only)       Balance Overall balance assessment: Needs assistance         Standing balance support: Bilateral upper extremity supported Standing balance-Leahy Scale: Poor Standing balance comment: requires UE support                             Pertinent Vitals/Pain Pain Assessment: 0-10 Pain Score: 6  Pain Location: bil LEs Pain Descriptors / Indicators: Sore;Aching Pain Intervention(s): Limited activity within patient's tolerance;Monitored during session;Patient requesting pain meds-RN notified    Home Living Family/patient expects to be discharged to:: Skilled nursing facility Living Arrangements: Alone               Additional Comments: has BSC and tub seat.      Prior Function Level of Independence: Needs assistance   Gait / Transfers Assistance Needed: states she has w/c which does not properly fit, short distance ambulator only   ADL's / Homemaking Assistance Needed: Pt reports she needs assist to complete ADL tasks        Hand Dominance        Extremity/Trunk Assessment  Upper Extremity Assessment: Overall WFL for tasks assessed           Lower Extremity Assessment: Generalized weakness (LUE 3+/5;  RUE grossly 4/5)         Communication   Communication: No difficulties  Cognition Arousal/Alertness: Awake/alert Behavior During Therapy: WFL for tasks assessed/performed Overall Cognitive Status: Within Functional Limits for tasks assessed                      General Comments      Exercises        Assessment/Plan    PT Assessment Patient needs continued PT services  PT Diagnosis Difficulty  walking;Acute pain   PT Problem List Decreased strength;Decreased activity tolerance;Decreased mobility;Cardiopulmonary status limiting activity;Obesity  PT Treatment Interventions DME instruction;Gait training;Patient/family education;Functional mobility training;Therapeutic activities;Therapeutic exercise;Wheelchair mobility training   PT Goals (Current goals can be found in the Care Plan section) Acute Rehab PT Goals Patient Stated Goal: decreased pain; get strength back PT Goal Formulation: With patient Time For Goal Achievement: 02/27/15 Potential to Achieve Goals: Good    Frequency Min 3X/week   Barriers to discharge        Co-evaluation PT/OT/SLP Co-Evaluation/Treatment: Yes Reason for Co-Treatment: Complexity of the patient's impairments (multi-system involvement);For patient/therapist safety PT goals addressed during session: Mobility/safety with mobility;Proper use of DME OT goals addressed during session: ADL's and self-care       End of Session Equipment Utilized During Treatment: Oxygen Activity Tolerance: Patient limited by pain Patient left: in bed;with call bell/phone within reach;with bed alarm set Nurse Communication: Mobility status;Patient requests pain meds         Time: 1436-1455 PT Time Calculation (min) (ACUTE ONLY): 19 min   Charges:   PT Evaluation $Initial PT Evaluation Tier I: 1 Procedure     PT G Codes:        [name],[name] 02/20/2015, 3:23 PM [name], PT, DPT 02/20/2015 Pager: [phone]

## 2015-02-21 ENCOUNTER — Ambulatory Visit (HOSPITAL_COMMUNITY)
Admit: 2015-02-21 | Discharge: 2015-02-21 | Disposition: A | Discharge status: Home / Self Care | Payer: Medicaid Other | Attending: Cardiology | Admitting: Cardiology

## 2015-02-21 DIAGNOSIS — N179 Acute kidney failure, unspecified: Secondary | ICD-10-CM

## 2015-02-21 DIAGNOSIS — R079 Chest pain, unspecified: Secondary | ICD-10-CM

## 2015-02-21 DIAGNOSIS — Z720 Tobacco use: Secondary | ICD-10-CM

## 2015-02-21 DIAGNOSIS — I2510 Atherosclerotic heart disease of native coronary artery without angina pectoris: Secondary | ICD-10-CM

## 2015-02-21 DIAGNOSIS — I10 Essential (primary) hypertension: Secondary | ICD-10-CM

## 2015-02-21 DIAGNOSIS — R7989 Other specified abnormal findings of blood chemistry: Secondary | ICD-10-CM

## 2015-02-21 DIAGNOSIS — J9611 Chronic respiratory failure with hypoxia: Secondary | ICD-10-CM

## 2015-02-21 LAB — CBC
HEMATOCRIT: 34.8 % — AB (ref 36.0–46.0)
Hemoglobin: 10.8 g/dL — ABNORMAL LOW (ref 12.0–15.0)
MCH: 25.8 pg — ABNORMAL LOW (ref 26.0–34.0)
MCHC: 31 g/dL (ref 30.0–36.0)
MCV: 83.1 fL (ref 78.0–100.0)
Platelets: 149 10*3/uL — ABNORMAL LOW (ref 150–400)
RBC: 4.19 MIL/uL (ref 3.87–5.11)
RDW: 17.2 % — ABNORMAL HIGH (ref 11.5–15.5)
WBC: 7.7 10*3/uL (ref 4.0–10.5)

## 2015-02-21 MED ORDER — METOPROLOL SUCCINATE ER 50 MG PO TB24
50.0000 mg | ORAL_TABLET | Freq: Every day | ORAL | Status: DC
  Administered 2015-02-21: 50 mg via ORAL
  Filled 2015-02-21 (×2): qty 1

## 2015-02-21 NOTE — Progress Notes (Signed)
Unable to complete cardiac MRI as ordered due to patient not fitting in the MRI scanner. Brooke RN aware and patient will be transferred back to [facility]. Paged Dr Mendel Corning to make her aware.

## 2015-02-21 NOTE — Progress Notes (Signed)
Patient ID: [name], female   DOB: 02/04/1956, 59 y.o.   MRN: [id number]  [facility] PROGRESS NOTE  YARITHZA ARVIE DTO:[id number] DOB: [birth year] DOA: 02/17/2015 PCP: No primary care provider on file.   Brief Narrative:   59 y.o. female with PMH of hypertension, hyperlipidemia, GERD, prior stroke, chronic respiratory failure on 2 L of oxygen at home secondary COPD, morbid obesity/wheelchair bound at baseline, CAD status post stent with chronic elevation of troponins and with recent hospital admission 02/15/15-02/17/15 after treatment for hypertensive urgency who developed bilateral calf pain after discharge and subsequently came back to the ED for further evaluation.  Assessment/Plan:   Principal Problem:  Leg pain/lower extremity weakness - Dopplers negative for DVT. - Plain films of the left knee were negative for acute bony abnormalities (osteoarthritis noted). - PT/OT consulted, pt will need SNF upon discharge   Active Problems:  Chest pain rule out unstable angina - Developed acute chest pain 02/19/15.  - 12 lead EKG: NSR with PVCs/inferolateral ischemic changes.  - CT of the chest negative for pulmonary embolism. - Cardiology following. Cardiac MRI planned for today - Rule out amyloid/sarcoid. - Already on Imdur. Metoprolol added 02/19/15.   Morbid obesity - BMI 52.2.   Cigarette smoker - Continue nicotine patch.   GERD - Continue Pepcid.   Osteoarthritis - On Percocet as needed for pain. Plain films of the knee confirm left tricompartmental arthritis.   CAD (coronary artery disease) / chronically elevated troponin / history of stroke - Continue aspirin, beta blocker and statin. Continue nitroglycerin as needed. - Cardiac cath done 10/09/14: Non-obstructive CAD (50% stenosis 3rd branch of RCA). - Troponins consistently elevated 0.2-0.3 since 10/07/14.   HLD (hyperlipidemia) - Continue statin.    Acute thrombocytopenia - no signs of  bleeding - repeat CBC in AM   Chronic respiratory failure- on home O2 - Continue supplemental oxygen.   Hypertension, accelerated  - Continue Norvasc, metoprolol and Cardura.   AKI (acute kidney injury) - Likely secondary to treatment with ACE inhibitor, diuretics and NSAIDs. - Creatinine back to baseline values after hydration.   DVT Prophylaxis - Heparin SQ      Code Status: Full.  Family Communication:  plan of care discussed with the patient Disposition Plan: SNF when cleared by cardiology   IV access:  Peripheral IV  Procedures and diagnostic studies:    Dg Chest 2 View  02/17/2015   CLINICAL DATA:  59 year old female with shortness of breath  EXAM: CHEST  2 VIEW  COMPARISON:  Radiograph dated 02/15/2015  FINDINGS: Two views of the chest do not demonstrate any focal consolidation, pleural effusion, or pneumothorax. Stable cardiomegaly. The osseous structures are grossly unremarkable.  IMPRESSION: No active cardiopulmonary disease.   Electronically Signed   By: Elgie Collard M.D.   On: 02/17/2015 23:54   Dg Knee 1-2 Views Left  02/18/2015   CLINICAL DATA:  Recurrent left knee pain after a fall on 12/05/2014.  EXAM: LEFT KNEE - 1-2 VIEW  COMPARISON:  12/05/2013  FINDINGS: Prominent tricompartment degenerative changes throughout the left knee. Medial and patellofemoral compartment narrowing with prominent osteophytes in all 3 compartments. No significant effusion. No acute fracture or dislocation. Vascular calcifications.  IMPRESSION: Prominent tricompartment degenerative changes throughout the left knee. No acute bony abnormalities.   Electronically Signed   By: Burman Nieves M.D.   On: 02/18/2015 05:06   Ct Angio Chest Pe W/cm &/or Wo Cm  02/19/2015   CLINICAL DATA:  Shortness of breath,  chest pain  EXAM: CT ANGIOGRAPHY CHEST WITH CONTRAST  TECHNIQUE: Multidetector CT imaging of the chest was performed using the standard protocol during bolus administration of  intravenous contrast. Multiplanar CT image reconstructions and MIPs were obtained to evaluate the vascular anatomy.  CONTRAST:  OMNIPAQUE IOHEXOL 350 MG/ML SOLN  COMPARISON:  None.  FINDINGS: Left arm contrast injection. Innominate vein and SVC patent. The right ventricle appears nondilated. Satisfactory opacification of pulmonary arteries noted, and there is no evidence of pulmonary emboli. Patent pulmonary veins bilaterally. Left ventricular hypertrophy. Patchy coronary calcifications. Adequate contrast opacification of the thoracic aorta with no evidence of dissection, aneurysm, or stenosis. There is classic 3-vessel brachiocephalic arch anatomy without proximal stenosis. Patchy calcifications in the aortic arch.  Trace bilateral pleural effusions left greater than right and trace pericardial effusion. No hilar or mediastinal adenopathy. Atelectasis/consolidation posteriorly in the lower lobes left greater than right. Poorly marginated patchy airspace opacity in the right upper lobe centrally. Patient motion during the acquisition degrades parenchymal evaluation. Flowing osteophytes across multiple levels in the mid and lower thoracic spine. Sternum intact. Visualized portions of upper abdomen unremarkable.  Review of the MIP images confirms the above findings.  IMPRESSION: 1. Negative for acute PE or thoracic aortic dissection. 2. Left ventricular hypertrophy. 3. Atherosclerosis, including aortic and coronary artery disease. Please note that although the presence of coronary artery calcium documents the presence of coronary artery disease, the severity of this disease and any potential stenosis cannot be assessed on this non-gated CT examination. Assessment for potential risk factor modification, dietary therapy or pharmacologic therapy may be warranted, if clinically indicated. 4. Small pleural and pericardial effusions.   Electronically Signed   By: Corlis Leak M.D.   On: 02/19/2015 16:18   US  Renal  02/18/2015   CLINICAL DATA:  Acute kidney injury.  EXAM: RENAL / URINARY TRACT ULTRASOUND COMPLETE  COMPARISON:  CT abdomen and pelvis 02/23/2008. Renal ultrasound 02/19/2008.  FINDINGS: Examination was limited by patient body habitus, with the left kidney particularly not well visualized.  Right Kidney:  Length: 10.0 cm. Echogenicity within normal limits. No mass or hydronephrosis visualized.  Left Kidney:  Length: 10.2 cm.   No gross mass or hydronephrosis visualized.  Bladder:  Appears normal for degree of bladder distention.  IMPRESSION: Partially limited examination due to patient body habitus with suboptimal visualization of the left kidney. No hydronephrosis identified.   Electronically Signed   By: Sebastian Ache M.D.   On: 02/18/2015 15:06   Dg Chest Port 1 View  02/15/2015   CLINICAL DATA:  Hypertension ; headache with blurred vision  EXAM: PORTABLE CHEST - 1 VIEW  COMPARISON:  October 07, 2014  FINDINGS: There is no edema or consolidation. Heart is mildly enlarged with pulmonary vascularity within normal limits. No adenopathy. There is atherosclerotic change in the aortic arch region. There is thoracolumbar levoscoliosis. No adenopathy.  IMPRESSION: Mild cardiac enlargement, stable.  No edema or consolidation.   Electronically Signed   By: Bretta Bang III M.D.   On: 02/15/2015 17:22    Medical Consultants:  Cardiology   Other Consultants:  PT/OT  IAnti-Infectives:   None  Debbora Presto, MD  [facility] Pager [phone]  If 7PM-7AM, please contact night-coverage [email or web address] Password [facility] 02/21/2015, 9:53 PM   LOS: 3 days   HPI/Subjective: No events overnight.   Objective: Filed Vitals:   02/20/15 1946 02/21/15 0407 02/21/15 0832 02/21/15 1700  BP: 155/57 172/72 178/80 160/72  Pulse: 64 67  Temp: 98.1 F (36.7 C) 98.8 F (37.1 C)    TempSrc: Oral Oral    Resp: 22 22    Height:      Weight:      SpO2: 100% 100%     No intake or output data in the 24 hours  ending 02/21/15 2153  Exam:   General:  Pt is alert, follows commands appropriately, not in acute distress  Cardiovascular: Regular rate and rhythm, no rubs, no gallops  Respiratory: Clear to auscultation bilaterally, no wheezing, no crackles, no rhonchi  Abdomen: Soft, non tender, non distended, bowel sounds present, no guarding  Data Reviewed: Basic Metabolic Panel:  Recent Labs Lab 02/15/15 1644 02/17/15 2010 02/18/15 0514 02/19/15 1130 02/20/15 0159  NA 144 142 143 142 138  K 3.5 3.6 3.5 3.5 4.0  CL 104 108 111 109 108  CO2 GLUCOSE 83 126* 101* 109* 100*  BUN 13 30* 25* 19 21*  CREATININE 0.71 1.73* 1.10* 0.99 0.84  CALCIUM 9.8 8.9 8.4* 9.1 8.5*   Liver Function Tests:  Recent Labs Lab 02/15/15 1644 02/18/15 0514  AST 29 20  ALT 16 15  ALKPHOS 77 61  BILITOT 0.6 0.3  PROT 9.1* 6.6  ALBUMIN 4.3 3.5   No results for input(s): LIPASE, AMYLASE in the last 168 hours. No results for input(s): AMMONIA in the last 168 hours. CBC:  Recent Labs Lab 02/15/15 1644 02/17/15 2010 02/18/15 0514 02/20/15 0159 02/21/15 0449  WBC 11.2* 9.7 9.3 8.4 7.7  NEUTROABS 7.7 7.0  --   --   --   HGB 15.6* 11.3* 11.1* 10.5* 10.8*  HCT 47.8* 36.2 34.8* 33.9* 34.8*  MCV 81.4 81.9 82.7 82.5 83.1  PLT 187 178 163 149* 149*   Cardiac Enzymes:  Recent Labs Lab 02/15/15 1644  02/16/15 0300  02/18/15 1010 02/18/15 1644 02/19/15 1129 02/19/15 1850 02/20/15 0159  CKTOTAL 347*  --  281*  --   --   --   --   --   --   TROPONINI 0.28*  < > 0.25*  < > 0.22* 0.23* 0.24* 0.22* 0.21*  < > = values in this interval not displayed. BNP: Invalid input(s): POCBNP CBG:  Recent Labs Lab 02/18/15 0742  GLUCAP 92    Recent Results (from the past 240 hour(s))  Urine culture     Status: None   Collection Time: 02/15/15  4:15 PM  Result Value Ref Range Status   Specimen Description URINE, CLEAN CATCH  Final   Special Requests NONE  Final   Culture   Final     MULTIPLE SPECIES PRESENT, SUGGEST RECOLLECTION Performed at [facility]    Report Status 02/17/2015 FINAL  Final     Scheduled Meds: . amLODipine  10 mg Oral Daily  . aspirin EC  81 mg Oral Daily  . atorvastatin  20 mg Oral q1800  . doxazosin  4 mg Oral QHS  . famotidine  20 mg Oral QHS  . heparin subcutaneous  5,000 Units Subcutaneous 3 times per day  . hydrALAZINE  100 mg Oral TID  . isosorbide mononitrate  30 mg Oral Daily  . metoprolol succinate  50 mg Oral Daily  . nicotine  21 mg Transdermal Daily  . sodium chloride  3 mL Intravenous Q12H   Continuous Infusions:

## 2015-02-21 NOTE — Progress Notes (Signed)
Patient Profile: 59 y/o obese female with h/o nonobstructive CAD, O2 dependent COPD, HTN and h/o medication noncompliance admitted for chest pain eval. D-dimer elevated but CT of chest negative for PE and dissection. LE dopplers negative for DVT. Flat low level cardiac enzymes (0.24, 0.22, 0.21). 2D with normal EF but severe LVH and appearance concerning for amyloid.   Subjective: CP free. Scheduled for cardiac MRI today at [facility] to assess for amyloid.   Objective: Vital signs in last 24 hours: Temp:  [98.1 F (36.7 C)-98.8 F (37.1 C)] 98.8 F (37.1 C) (09/14 0407) Pulse Rate:  [64-72] 67 (09/14 0407) Resp:  [20-22] 22 (09/14 0407) BP: (155-178)/(50-80) 178/80 mmHg (09/14 0832) SpO2:  [100 %] 100 % (09/14 0407) Last BM Date: 02/19/15  Intake/Output from previous day: 09/13 0701 - 09/14 0700 In: 723 [P.O.:720; I.V.:3] Out: 300 [Urine:300] Intake/Output this shift:    Medications Current Facility-Administered Medications  Medication Dose Route Frequency Provider Last Rate Last Dose  . acetaminophen (TYLENOL) tablet 650 mg  650 mg Oral Q6H PRN Lorretta Harp, MD       Or  . acetaminophen (TYLENOL) suppository 650 mg  650 mg Rectal Q6H PRN Lorretta Harp, MD      . albuterol (PROVENTIL) (2.5 MG/3ML) 0.083% nebulizer solution 2.5 mg  2.5 mg Nebulization Q4H PRN Lorretta Harp, MD      . alum & mag hydroxide-simeth (MAALOX/MYLANTA) 200-200-20 MG/5ML suspension 30 mL  30 mL Oral Q6H PRN Lorretta Harp, MD      . amLODipine (NORVASC) tablet 10 mg  10 mg Oral Daily Lorretta Harp, MD   10 mg at 02/20/15 0917  . aspirin EC tablet 81 mg  81 mg Oral Daily Lorretta Harp, MD   81 mg at 02/20/15 1610  . atorvastatin (LIPITOR) tablet 20 mg  20 mg Oral q1800 Lorretta Harp, MD   20 mg at 02/20/15 1542  . doxazosin (CARDURA) tablet 4 mg  4 mg Oral QHS Lorretta Harp, MD   4 mg at 02/20/15 2215  . famotidine (PEPCID) tablet 20 mg  20 mg Oral QHS Lorretta Harp, MD   20 mg at 02/20/15 2215  . heparin injection 5,000 Units  5,000 Units  Subcutaneous 3 times per day Quintella Reichert, MD   5,000 Units at 02/21/15 0544  . hydrALAZINE (APRESOLINE) injection 5 mg  5 mg Intravenous Q2H PRN Lorretta Harp, MD   5 mg at 02/21/15 9604  . hydrALAZINE (APRESOLINE) tablet 100 mg  100 mg Oral TID Lorretta Harp, MD   100 mg at 02/20/15 2215  . isosorbide mononitrate (IMDUR) 24 hr tablet 30 mg  30 mg Oral Daily Quintella Reichert, MD      . metoprolol succinate (TOPROL-XL) 24 hr tablet 25 mg  25 mg Oral Daily Quintella Reichert, MD      . morphine 2 MG/ML injection 2 mg  2 mg Intravenous Q4H PRN Lorretta Harp, MD   2 mg at 02/20/15 1542  . nicotine (NICODERM CQ - dosed in mg/24 hours) patch 21 mg  21 mg Transdermal Daily Lorretta Harp, MD   21 mg at 02/20/15 0915  . nitroGLYCERIN (NITROSTAT) SL tablet 0.4 mg  0.4 mg Sublingual Q5 min PRN Cheri Fowler, PA-C   0.4 mg at 02/19/15 1013  . oxyCODONE-acetaminophen (PERCOCET/ROXICET) 5-325 MG per tablet 2 tablet  2 tablet Oral Q6H PRN Lorretta Harp, MD   2 tablet at 02/20/15 1419  . sodium chloride 0.9 % injection 3 mL  3 mL Intravenous Q12H Lorretta Harp, MD   3 mL at 02/20/15 2216    PE: General appearance: alert, cooperative, no distress and morbidly obese Neck: no carotid bruit and no JVD Lungs: clear to auscultation bilaterally Heart: regular rate and rhythm, S1, S2 normal, no murmur, click, rub or gallop Extremities: trace bilateral LEE Pulses: 2+ and symmetric Skin: warm and dry Neurologic: Grossly normal  Lab Results:   Recent Labs  02/20/15 0159 02/21/15 0449  WBC 8.4 7.7  HGB 10.5* 10.8*  HCT 33.9* 34.8*  PLT 149* 149*   BMET  Recent Labs  02/19/15 1130 02/20/15 0159  NA 142 138  K 3.5 4.0  CL 109 108  CO2 26 22  GLUCOSE 109* 100*  BUN 19 21*  CREATININE 0.99 0.84  CALCIUM 9.1 8.5*   PT/INR No results for input(s): LABPROT, INR in the last 72 hours. Cardiac Panel (last 3 results)  Recent Labs  02/19/15 1129 02/19/15 1850 02/20/15 0159  TROPONINI 0.24* 0.22* 0.21*    Studies/Results: CT  of Chest 02/19/15 IMPRESSION: 1. Negative for acute PE or thoracic aortic dissection. 2. Left ventricular hypertrophy. 3. Atherosclerosis, including aortic and coronary artery disease. Please note that although the presence of coronary artery calcium documents the presence of coronary artery disease, the severity of this disease and any potential stenosis cannot be assessed on this non-gated CT examination. Assessment for potential risk factor modification, dietary therapy or pharmacologic therapy may be warranted, if clinically indicated. 4. Small pleural and pericardial effusions.  2D Echo  02/20/15 Study Conclusions  - Left ventricle: The cavity size was normal. Wall thickness was increased in a pattern of severe LVH. Systolic function was normal. The estimated ejection fraction was in the range of 55% to 60%. Wall motion was normal; there were no regional wall motion abnormalities. Doppler parameters are consistent with restrictive physiology, indicative of decreased left ventricular diastolic compliance and/or increased left atrial pressure. Doppler parameters are consistent with high ventricular filling pressure. - Mitral valve: Calcified annulus. - Left atrium: The atrium was mildly dilated. - Pericardium, extracardiac: A small pericardial effusion was identified.  Impressions:  - Normal LV systolic function; restrictive filling with elevated LV filling pressure; severe LVH; mild LAE; small pericardial effusion; myocardium with speckled appearance; consider amyloid.   Assessment/Plan  Principal Problem:   Leg pain Active Problems:   Morbid obesity   Cigarette smoker   GERD   Osteoarthritis   CAD (coronary artery disease)   Stroke   HLD (hyperlipidemia)   Chronic respiratory failure- on home O2   Elevated troponin   Hypertension   AKI (acute kidney injury)   Chest pain   Edema   LVH (left ventricular hypertrophy)   1. Chest Pain:  currently CP free. CT of chest negative for acute PE and dissection. The presence of atherosclerosis, including aortic and coronary artery disease was mentioned in report. 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 This was far too distal to consider PCI. She had otherwise normal coronary arteries.  Her troponin's are also low level with a flat trend making ACS less likely. 2D echo showed normal LV systolic function; restrictive filling with elevated LV filling pressure; severe LVH; mild LAE; small pericardial effusion; myocardium with speckled appearance; consider amyloid- She is scheduled for cardiac MRI today to evaluate further for amyloid but one thing that goes against this diagnosis is that voltage is normal on EKG and usually there is low voltage throughout.  Suspect that her CP and abnormal troponin may be secondary to severe HTN. She notes SL NTG relieved her pain. Cath in April showed near cavity obliteration of LV in systole and EF on echo in 65-70% with severe LVH so would avoid increasing nitrates further which could drop preload.  Toprol was increased to 25mg  daily.    2. HTN: her BP is still elevated in the 170s systolic. Her Toprol was recently increased to 25 mg yesterday for CP and HTN. She is also on amlodipine/cardura/hydralazine as well. May need to further increase Toprol. Will defer to MD.    LOS: 3 days    Brittainy M. Sharol Harness, PA-C 02/21/2015 9:30 AM

## 2015-02-21 NOTE — Progress Notes (Signed)
OT Cancellation Note  Patient Details Name: [name] MRN: [id number] DOB: 1955-10-02   Cancelled Treatment:    Reason Eval/Treat Not Completed: Patient at procedure or test/ unavailable  Press [place] 02/21/2015, 10:44 AM  Marica Otter, OTR/L [phone] 02/21/2015

## 2015-02-21 NOTE — Care Management Note (Signed)
Case Management Note  Patient Details  Name: ANTANIYAH SHENKER MRN: [id number] Date of Birth: Feb 26, 1956  Subjective/Objective:59 y/o f admitted w/Lleg pain.TU:UEKC dependent 02,htn.Cardio-was scheduled for cardiac MRI-cancelled.htn-hydralazine iv x1.PT-SNF                    Action/Plan:d/c plan SNF.   Expected Discharge Date:                  Expected Discharge Plan:  Skilled Nursing Facility  In-House Referral:  Clinical Social Work  Discharge planning Services  CM Consult  Post Acute Care Choice:    Choice offered to:     DME Arranged:    DME Agency:     HH Arranged:    HH Agency:     Status of Service:  In process, will continue to follow  Medicare Important Message Given:    Date Medicare IM Given:    Medicare IM give by:    Date Additional Medicare IM Given:    Additional Medicare Important Message give by:     If discussed at Long Length of Stay Meetings, dates discussed:    Additional Comments:  Lanier Clam, RN 02/21/2015, 8:16 PM

## 2015-02-22 DIAGNOSIS — I25119 Atherosclerotic heart disease of native coronary artery with unspecified angina pectoris: Secondary | ICD-10-CM

## 2015-02-22 DIAGNOSIS — J449 Chronic obstructive pulmonary disease, unspecified: Secondary | ICD-10-CM

## 2015-02-22 LAB — CBC
HCT: 34.3 % — ABNORMAL LOW (ref 36.0–46.0)
HEMOGLOBIN: 10.6 g/dL — AB (ref 12.0–15.0)
MCH: 25.3 pg — ABNORMAL LOW (ref 26.0–34.0)
MCHC: 30.9 g/dL (ref 30.0–36.0)
MCV: 81.9 fL (ref 78.0–100.0)
PLATELETS: 155 10*3/uL (ref 150–400)
RBC: 4.19 MIL/uL (ref 3.87–5.11)
RDW: 17 % — ABNORMAL HIGH (ref 11.5–15.5)
WBC: 7.3 10*3/uL (ref 4.0–10.5)

## 2015-02-22 LAB — BASIC METABOLIC PANEL
ANION GAP: 4 — AB (ref 5–15)
BUN: 16 mg/dL (ref 6–20)
CHLORIDE: 110 mmol/L (ref 101–111)
CO2: 27 mmol/L (ref 22–32)
Calcium: 8.7 mg/dL — ABNORMAL LOW (ref 8.9–10.3)
Creatinine, Ser: 0.75 mg/dL (ref 0.44–1.00)
GFR calc non Af Amer: >60 mL/min (ref >60)
Glucose, Bld: 95 mg/dL (ref 65–99)
Potassium: 3.9 mmol/L (ref 3.5–5.1)
SODIUM: 141 mmol/L (ref 135–145)

## 2015-02-22 MED ORDER — METOPROLOL SUCCINATE ER 50 MG PO TB24
75.0000 mg | ORAL_TABLET | Freq: Every day | ORAL | Status: DC
  Administered 2015-02-22: 75 mg via ORAL
  Filled 2015-02-22 (×2): qty 1

## 2015-02-22 MED ORDER — NITROGLYCERIN 0.4 MG SL SUBL: 0.4000 mg | SUBLINGUAL_TABLET | SUBLINGUAL | Status: DC | PRN

## 2015-02-22 MED ORDER — ALBUTEROL SULFATE (2.5 MG/3ML) 0.083% IN NEBU: 2.5000 mg | INHALATION_SOLUTION | RESPIRATORY_TRACT | Status: DC | PRN

## 2015-02-22 MED ORDER — METOPROLOL SUCCINATE ER 25 MG PO TB24: 75.0000 mg | ORAL_TABLET | Freq: Every day | ORAL | Status: DC

## 2015-02-22 MED ORDER — OXYCODONE-ACETAMINOPHEN 5-325 MG PO TABS: 2.0000 | ORAL_TABLET | Freq: Four times a day (QID) | ORAL | Status: DC | PRN

## 2015-02-22 NOTE — Discharge Instructions (Signed)

## 2015-02-22 NOTE — Clinical Social Work Placement (Signed)
Patient is set to discharge to [facility] SNF today. Patient & daughter, [name] aware. Discharge packet given to RN, Susie. [facility] called for transport.     Lincoln Maxin, LCSW [facility] Clinical Social Worker cell #: [phone]    CLINICAL SOCIAL WORK PLACEMENT  NOTE  Date:  02/22/2015  Patient Details  Name: [name] MRN: [id number] Date of Birth: 1956-01-14  Clinical Social Work is seeking post-discharge placement for this patient at the Skilled  Nursing Facility level of care (*CSW will initial, date and re-position this form in  chart as items are completed):  Yes   Patient/family provided with [facility] Clinical Social Work Department's list of facilities offering this level of care within the geographic area requested by the patient (or if unable, by the patient's family).  Yes   Patient/family informed of their freedom to choose among providers that offer the needed level of care, that participate in Medicare, Medicaid or managed care program needed by the patient, have an available bed and are willing to accept the patient.  Yes   Patient/family informed of [facility] ownership interest in [facility] and [facility], as well as of the fact that they are under no obligation to receive care at these facilities.  PASRR submitted to EDS on 02/19/15     PASRR number received on 02/19/15     Existing PASRR number confirmed on       FL2 transmitted to all facilities in geographic area requested by pt/family on 02/19/15     FL2 transmitted to all facilities within larger geographic area on       Patient informed that his/her managed care company has contracts with or will negotiate with certain facilities, including the following:        Yes   Patient/family informed of bed offers received.  Patient chooses bed at [facility]     Physician recommends and patient chooses bed at      Patient  to be transferred to [facility] on 02/22/15.  Patient to be transferred to facility by [facility]     Patient family notified on 02/22/15 of transfer.  Name of family member notified:  patient's daughter, [name] via phone     PHYSICIAN       Additional Comment:    _______________________________________________ Arlyss Repress, LCSW 02/22/2015, 11:56 AM

## 2015-02-22 NOTE — Progress Notes (Addendum)
SUBJECTIVE:  No complaints  OBJECTIVE:   Vitals:   Filed Vitals:   02/21/15 0832 02/21/15 1700 02/21/15 2226 02/22/15 0532  BP: 178/80 160/72 161/52 174/60  Pulse:   68 71  Temp:   98.5 F (36.9 C) 98.2 F (36.8 C)  TempSrc:   Oral Oral  Resp:   18 20  Height:      Weight:      SpO2:   99% 100%   I&O's:  No intake or output data in the 24 hours ending 02/22/15 0935 TELEMETRY: Reviewed telemetry pt in NSR:     PHYSICAL EXAM General: Well developed, well nourished, in no acute distress Head: Eyes PERRLA, No xanthomas.   Normal cephalic and atramatic  Lungs:   Clear bilaterally to auscultation and percussion. Heart:   HRRR S1 S2 Pulses are 2+ & equal. Abdomen: Bowel sounds are positive, abdomen soft and non-tender without masses Extremities:   No clubbing, cyanosis or edema.  DP +1 Neuro: Alert and oriented X 3. Psych:  Good affect, responds appropriately   LABS: Basic Metabolic Panel:  Recent Labs  22/48/25 0159 02/22/15 0500  NA 138 141  K 4.0 3.9  CL 108 110  CO2 22 27  GLUCOSE 100* 95  BUN 21* 16  CREATININE 0.84 0.75  CALCIUM 8.5* 8.7*   Liver Function Tests: No results for input(s): AST, ALT, ALKPHOS, BILITOT, PROT, ALBUMIN in the last 72 hours. No results for input(s): LIPASE, AMYLASE in the last 72 hours. CBC:  Recent Labs  02/21/15 0449 02/22/15 0500  WBC 7.7 7.3  HGB 10.8* 10.6*  HCT 34.8* 34.3*  MCV 83.1 81.9  PLT 149* 155   Cardiac Enzymes:  Recent Labs  02/19/15 1129 02/19/15 1850 02/20/15 0159  TROPONINI 0.24* 0.22* 0.21*   BNP: Invalid input(s): POCBNP D-Dimer: No results for input(s): DDIMER in the last 72 hours. Hemoglobin A1C: No results for input(s): HGBA1C in the last 72 hours. Fasting Lipid Panel: No results for input(s): CHOL, HDL, LDLCALC, TRIG, CHOLHDL, LDLDIRECT in the last 72 hours. Thyroid Function Tests: No results for input(s): TSH, T4TOTAL, T3FREE, THYROIDAB in the last 72 hours.  Invalid input(s):  FREET3 Anemia Panel: No results for input(s): VITAMINB12, FOLATE, FERRITIN, TIBC, IRON, RETICCTPCT in the last 72 hours. Coag Panel:   Lab Results  Component Value Date   INR 0.98 02/18/2015   INR 1.00 10/09/2014   INR 0.96 04/27/2012    RADIOLOGY: Dg Chest 2 View  02/17/2015   CLINICAL DATA:  59 year old female with shortness of breath  EXAM: CHEST  2 VIEW  COMPARISON:  Radiograph dated 02/15/2015  FINDINGS: Two views of the chest do not demonstrate any focal consolidation, pleural effusion, or pneumothorax. Stable cardiomegaly. The osseous structures are grossly unremarkable.  IMPRESSION: No active cardiopulmonary disease.   Electronically Signed   By: Elgie Collard M.D.   On: 02/17/2015 23:54   Dg Knee 1-2 Views Left  02/18/2015   CLINICAL DATA:  Recurrent left knee pain after a fall on 12/05/2014.  EXAM: LEFT KNEE - 1-2 VIEW  COMPARISON:  12/05/2013  FINDINGS: Prominent tricompartment degenerative changes throughout the left knee. Medial and patellofemoral compartment narrowing with prominent osteophytes in all 3 compartments. No significant effusion. No acute fracture or dislocation. Vascular calcifications.  IMPRESSION: Prominent tricompartment degenerative changes throughout the left knee. No acute bony abnormalities.   Electronically Signed   By: Burman Nieves M.D.   On: 02/18/2015 05:06   Ct Angio Chest Pe W/cm &/or  Wo Cm  02/19/2015   CLINICAL DATA:  Shortness of breath, chest pain  EXAM: CT ANGIOGRAPHY CHEST WITH CONTRAST  TECHNIQUE: Multidetector CT imaging of the chest was performed using the standard protocol during bolus administration of intravenous contrast. Multiplanar CT image reconstructions and MIPs were obtained to evaluate the vascular anatomy.  CONTRAST:  OMNIPAQUE IOHEXOL 350 MG/ML SOLN  COMPARISON:  None.  FINDINGS: Left arm contrast injection. Innominate vein and SVC patent. The right ventricle appears nondilated. Satisfactory opacification of pulmonary  arteries noted, and there is no evidence of pulmonary emboli. Patent pulmonary veins bilaterally. Left ventricular hypertrophy. Patchy coronary calcifications. Adequate contrast opacification of the thoracic aorta with no evidence of dissection, aneurysm, or stenosis. There is classic 3-vessel brachiocephalic arch anatomy without proximal stenosis. Patchy calcifications in the aortic arch.  Trace bilateral pleural effusions left greater than right and trace pericardial effusion. No hilar or mediastinal adenopathy. Atelectasis/consolidation posteriorly in the lower lobes left greater than right. Poorly marginated patchy airspace opacity in the right upper lobe centrally. Patient motion during the acquisition degrades parenchymal evaluation. Flowing osteophytes across multiple levels in the mid and lower thoracic spine. Sternum intact. Visualized portions of upper abdomen unremarkable.  Review of the MIP images confirms the above findings.  IMPRESSION: 1. Negative for acute PE or thoracic aortic dissection. 2. Left ventricular hypertrophy. 3. Atherosclerosis, including aortic and coronary artery disease. Please note that although the presence of coronary artery calcium documents the presence of coronary artery disease, the severity of this disease and any potential stenosis cannot be assessed on this non-gated CT examination. Assessment for potential risk factor modification, dietary therapy or pharmacologic therapy may be warranted, if clinically indicated. 4. Small pleural and pericardial effusions.   Electronically Signed   By: Corlis Leak M.D.   On: 02/19/2015 16:18   US Renal  02/18/2015   CLINICAL DATA:  Acute kidney injury.  EXAM: RENAL / URINARY TRACT ULTRASOUND COMPLETE  COMPARISON:  CT abdomen and pelvis 02/23/2008. Renal ultrasound 02/19/2008.  FINDINGS: Examination was limited by patient body habitus, with the left kidney particularly not well visualized.  Right Kidney:  Length: 10.0 cm. Echogenicity  within normal limits. No mass or hydronephrosis visualized.  Left Kidney:  Length: 10.2 cm.   No gross mass or hydronephrosis visualized.  Bladder:  Appears normal for degree of bladder distention.  IMPRESSION: Partially limited examination due to patient body habitus with suboptimal visualization of the left kidney. No hydronephrosis identified.   Electronically Signed   By: Sebastian Ache M.D.   On: 02/18/2015 15:06   Dg Chest Port 1 View  02/15/2015   CLINICAL DATA:  Hypertension ; headache with blurred vision  EXAM: PORTABLE CHEST - 1 VIEW  COMPARISON:  October 07, 2014  FINDINGS: There is no edema or consolidation. Heart is mildly enlarged with pulmonary vascularity within normal limits. No adenopathy. There is atherosclerotic change in the aortic arch region. There is thoracolumbar levoscoliosis. No adenopathy.  IMPRESSION: Mild cardiac enlargement, stable.  No edema or consolidation.   Electronically Signed   By: Bretta Bang III M.D.   On: 02/15/2015 17:22    Assessment/Plan  Principal Problem:  Leg pain Active Problems:  Morbid obesity  Cigarette smoker  GERD  Osteoarthritis  CAD (coronary artery disease)  Stroke  HLD (hyperlipidemia)  Chronic respiratory failure- on home O2  Elevated troponin  Hypertension  AKI (acute kidney injury)  Chest pain  Edema  LVH (left ventricular hypertrophy)   1. Chest Pain:  currently CP free. CT of chest negative for acute PE and dissection. The presence of atherosclerosis, including aortic and coronary artery disease was mentioned in report. 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 This was far too distal to consider PCI. She had otherwise normal coronary arteries. Her troponin's are also low level with a flat trend making ACS less likely. 2D echo showed normal LV systolic function; restrictive filling with elevated LV filling pressure; severe LVH; mild LAE; small pericardial  effusion; myocardium with speckled appearance; consider amyloid- She went to [facility] yesterday to evaluate further for amyloid but due to obesity could not fit  .One thing that goes against this diagnosis is that voltage is normal on EKG and usually there is low voltage throughout. Suspect that her CP and abnormal troponin may be secondary to severe HTN. She notes SL NTG relieved her pain. Cath in April showed near cavity obliteration of LV in systole and EF on echo in 65-70% with severe LVH so would avoid increasing nitrates further which could drop preload. Increase BB as needed for BP control.  2. HTN: her BP is still elevated in the 170s systolic. Her Toprol was recently increased to 25 mg for CP and HTN. She is also on amlodipine/cardura/hydralazine as well. Will increase to  daily for better BP control.  No further recs at this time. Needs aggressive BP control.  We will arrange an outpt followup with Dr. Royann Shivers to consider fat pad bx.  Will sign off.  Call with any questions.      Quintella Reichert, MD  02/22/2015  9:35 AM

## 2015-02-22 NOTE — Discharge Summary (Signed)
Physician Discharge Summary  [name] WJX:[id number] DOB: Nov 17, 1955 DOA: 02/17/2015  PCP: No primary care provider on file.  Admit date: 02/17/2015 Discharge date: 02/22/2015  Recommendations for Outpatient Follow-up:  1. Pt will need to follow up with PCP in 2-3 weeks post discharge 2. Please obtain BMP to evaluate electrolytes and kidney function 3. Please also check CBC to evaluate Hg and Hct levels 4. Please note appointment scheduled for 9/26 with cardiology  5. Cardiology recommends avoiding increasing nitrates further which could drop preload. Increase BB as needed for BP control. 6. Please note that Losartan and HCTZ were discontinued due to AKI, will continue to hold for now and may possibly resume if needed if renal funciton remains stable   Discharge Diagnoses:  Principal Problem:   Morbid obesity   Cigarette smoker   GERD   Osteoarthritis   CAD (coronary artery disease)   Chronic respiratory failure- on home O2   Elevated troponin   Hypertension   AKI (acute kidney injury)   LVH (left ventricular hypertrophy)  Discharge Condition: Stable  Diet recommendation: Heart healthy diet discussed in details    Brief Narrative:   59 y.o. female with PMH of hypertension, hyperlipidemia, GERD, prior stroke, chronic respiratory failure on 2 L of oxygen at home secondary COPD, morbid obesity/wheelchair bound at baseline, CAD status post stent with chronic elevation of troponins and with recent hospital admission 02/15/15-02/17/15 after treatment for hypertensive urgency who developed bilateral calf pain after discharge and subsequently came back to the ED for further evaluation.  Assessment/Plan:   Principal Problem:  Leg pain/lower extremity weakness - Dopplers negative for DVT. - Plain films of the left knee were negative for acute bony abnormalities (osteoarthritis noted). - PT/OT consulted, pt will need SNF upon discharge  - pain better controlled on  current regime, OK to continue upon discharge   Active Problems:  Chest pain due to angina and uncontrolled HTN - Developed acute chest pain 02/19/15.  - 12 lead EKG: NSR with PVCs/inferolateral ischemic changes.  - CT of the chest negative for pulmonary embolism. - Cardiology following. Cardiac MRI planned but could not complete due to obesity and pt could not fit  - per cardiology, suspect that her CP and abnormal troponin may be secondary to severe HTN - pt notes SL NTG relieved her pain - Cath in April showed near cavity obliteration of LV in systole and EF on echo in 65-70% with severe LVH so cardiology recommends avoiding increasing nitrates further which could drop preload. Increase BB as needed for BP control.   Morbid obesity - BMI 52.2.   Cigarette smoker - Continue nicotine patch. - smoking cassation addressed and pt counseled on cessation    GERD - Continue Pepcid.   Osteoarthritis - On Percocet as needed for pain. Plain films of the knee confirm left tricompartmental arthritis.   CAD (coronary artery disease) / chronically elevated troponin / angina ?unconntrolled HTN - Continue aspirin, beta blocker and statin. Continue nitroglycerin as needed. - Cardiac cath done 10/09/14: Non-obstructive CAD (50% stenosis 3rd branch of RCA). - Troponins consistently elevated 0.2-0.3 since 10/07/14.   HLD (hyperlipidemia) - Continue statin.   Acute thrombocytopenia - no signs of bleeding - reactive, resolved    Chronic respiratory failure secondary to COPD on home O2 - Continue supplemental oxygen upon discharge    Hypertension, accelerated, uncontrolled  - Continue Norvasc, metoprolol and Cardura.   AKI (acute kidney injury) - Likely secondary to treatment with ACE inhibitor, diuretics and  NSAIDs. - Lisinopril and HCTZ stopped  - Creatinine back to baseline values after hydration.      Code Status: Full.  Family Communication: plan of care discussed  with the patient, both daughters over the phone  Disposition Plan: SNF   IV access:  Peripheral IV  Procedures and diagnostic studies:    Imaging Results    Dg Chest 2 View  02/17/2015 CLINICAL DATA: 59 year old female with shortness of breath EXAM: CHEST 2 VIEW COMPARISON: Radiograph dated 02/15/2015 FINDINGS: Two views of the chest do not demonstrate any focal consolidation, pleural effusion, or pneumothorax. Stable cardiomegaly. The osseous structures are grossly unremarkable. IMPRESSION: No active cardiopulmonary disease. Electronically Signed By: Elgie Collard M.D. On: 02/17/2015 23:54   Dg Knee 1-2 Views Left  02/18/2015 CLINICAL DATA: Recurrent left knee pain after a fall on 12/05/2014. EXAM: LEFT KNEE - 1-2 VIEW COMPARISON: 12/05/2013 FINDINGS: Prominent tricompartment degenerative changes throughout the left knee. Medial and patellofemoral compartment narrowing with prominent osteophytes in all 3 compartments. No significant effusion. No acute fracture or dislocation. Vascular calcifications. IMPRESSION: Prominent tricompartment degenerative changes throughout the left knee. No acute bony abnormalities. Electronically Signed By: Burman Nieves M.D. On: 02/18/2015 05:06   Ct Angio Chest Pe W/cm &/or Wo Cm  02/19/2015 CLINICAL DATA: Shortness of breath, chest pain EXAM: CT ANGIOGRAPHY CHEST WITH CONTRAST TECHNIQUE: Multidetector CT imaging of the chest was performed using the standard protocol during bolus administration of intravenous contrast. Multiplanar CT image reconstructions and MIPs were obtained to evaluate the vascular anatomy. CONTRAST: OMNIPAQUE IOHEXOL 350 MG/ML SOLN COMPARISON: None. FINDINGS: Left arm contrast injection. Innominate vein and SVC patent. The right ventricle appears nondilated. Satisfactory opacification of pulmonary arteries noted, and there is no evidence of pulmonary emboli. Patent pulmonary veins  bilaterally. Left ventricular hypertrophy. Patchy coronary calcifications. Adequate contrast opacification of the thoracic aorta with no evidence of dissection, aneurysm, or stenosis. There is classic 3-vessel brachiocephalic arch anatomy without proximal stenosis. Patchy calcifications in the aortic arch. Trace bilateral pleural effusions left greater than right and trace pericardial effusion. No hilar or mediastinal adenopathy. Atelectasis/consolidation posteriorly in the lower lobes left greater than right. Poorly marginated patchy airspace opacity in the right upper lobe centrally. Patient motion during the acquisition degrades parenchymal evaluation. Flowing osteophytes across multiple levels in the mid and lower thoracic spine. Sternum intact. Visualized portions of upper abdomen unremarkable. Review of the MIP images confirms the above findings. IMPRESSION: 1. Negative for acute PE or thoracic aortic dissection. 2. Left ventricular hypertrophy. 3. Atherosclerosis, including aortic and coronary artery disease. Please note that although the presence of coronary artery calcium documents the presence of coronary artery disease, the severity of this disease and any potential stenosis cannot be assessed on this non-gated CT examination. Assessment for potential risk factor modification, dietary therapy or pharmacologic therapy may be warranted, if clinically indicated. 4. Small pleural and pericardial effusions. Electronically Signed By: Corlis Leak M.D. On: 02/19/2015 16:18   US Renal 02/18/2015 CLINICAL DATA: Acute kidney injury. EXAM: RENAL / URINARY TRACT ULTRASOUND COMPLETE COMPARISON: CT abdomen and pelvis 02/23/2008. Renal ultrasound 02/19/2008. FINDINGS: Examination was limited by patient body habitus, with the left kidney particularly not well visualized. Right Kidney: Length: 10.0 cm. Echogenicity within normal limits. No mass or hydronephrosis visualized. Left Kidney: Length: 10.2  cm. No gross mass or hydronephrosis visualized. Bladder: Appears normal for degree of bladder distention. IMPRESSION: Partially limited examination due to patient body habitus with suboptimal visualization of the left kidney. No hydronephrosis identified. Electronically  Signed By: Sebastian Ache M.D. On: 02/18/2015 15:06   Dg Chest Port 1 View 02/15/2015 CLINICAL DATA: Hypertension ; headache with blurred vision EXAM: PORTABLE CHEST - 1 VIEW COMPARISON: October 07, 2014 FINDINGS: There is no edema or consolidation. Heart is mildly enlarged with pulmonary vascularity within normal limits. No adenopathy. There is atherosclerotic change in the aortic arch region. There is thoracolumbar levoscoliosis. No adenopathy. IMPRESSION: Mild cardiac enlargement, stable. No edema or consolidation. Electronically Signed By: Bretta Bang III M.D. On: 02/15/2015 17:22     Medical Consultants:  Cardiology   Other Consultants:  PT/OT  IAnti-Infectives:   None      Discharge Exam: Filed Vitals:   02/22/15 0949  BP: 142/92  Pulse: 77  Temp:   Resp:    Filed Vitals:   02/21/15 2226 02/22/15 0532 02/22/15 0938 02/22/15 0949  BP: 161/52 174/60 142/92 142/92  Pulse: 68 71  77  Temp: 98.5 F (36.9 C) 98.2 F (36.8 C)    TempSrc: Oral Oral    Resp: 18 20    Height:      Weight:      SpO2: 99% 100%      General: Pt is alert, follows commands appropriately, not in acute distress Cardiovascular: Regular rate and rhythm, no rubs, no gallops Respiratory: Clear to auscultation bilaterally, no wheezing, no crackles, no rhonchi Abdominal: Soft, non tender, non distended, bowel sounds +, no guarding  Discharge Instructions  Discharge Instructions    Diet - low sodium heart healthy    Complete by:  As directed      Increase activity slowly    Complete by:  As directed             Medication List    STOP taking these medications        hydrochlorothiazide 25 MG  tablet  Commonly known as:  HYDRODIURIL     losartan 50 MG tablet  Commonly known as:  COZAAR      TAKE these medications        albuterol 108 (90 BASE) MCG/ACT inhaler  Commonly known as:  PROVENTIL HFA;VENTOLIN HFA  1 to 2 puffs every 4 to 6 hours as needed     albuterol (2.5 MG/3ML) 0.083% nebulizer solution  Commonly known as:  PROVENTIL  Take 3 mLs (2.5 mg total) by nebulization every 4 (four) hours as needed for wheezing or shortness of breath.     amLODipine 5 MG tablet  Commonly known as:  NORVASC  Take 2 tablets (10 mg total) by mouth daily.     aspirin 81 MG EC tablet  Take 1 tablet (81 mg total) by mouth daily.     atorvastatin 20 MG tablet  Commonly known as:  LIPITOR  Take 1 tablet (20 mg total) by mouth daily at 6 PM.     doxazosin 4 MG tablet  Commonly known as:  CARDURA  Take 1 tablet (4 mg total) by mouth at bedtime.     famotidine 20 MG tablet  Commonly known as:  PEPCID  One at bedtime     hydrALAZINE 50 MG tablet  Commonly known as:  APRESOLINE  Take 1 tablet (50 mg total) by mouth 3 (three) times daily.     isosorbide mononitrate 30 MG 24 hr tablet  Commonly known as:  IMDUR  Take 1 tablet (30 mg total) by mouth daily.     metoprolol succinate 25 MG 24 hr tablet  Commonly known as:  TOPROL-XL  Take  3 tablets (75 mg total) by mouth daily.     nitroGLYCERIN 0.4 MG SL tablet  Commonly known as:  NITROSTAT  Place 1 tablet (0.4 mg total) under the tongue every 5 (five) minutes as needed for chest pain (Do not give more than 3 SL tablets in 15 minutes.).     oxyCODONE-acetaminophen 5-325 MG per tablet  Commonly known as:  PERCOCET/ROXICET  Take 2 tablets by mouth every 6 (six) hours as needed for moderate pain.             Follow-up Information    Follow up with Theodore Demark, PA-C On 03/05/2015.   Specialties:  Cardiology, Radiology   Why:  11:30 AM (Dr. Erin Hearing PA)    Contact information:   [address] [place]  [place] [address] [phone]       Follow up with Thurmon Fair, MD On 04/20/2015.   Specialty:  Cardiology   Why:  11:15 AM    Contact information:   [address] [place] [address] [phone]        The results of significant diagnostics from this hospitalization (including imaging, microbiology, ancillary and laboratory) are listed below for reference.     Microbiology: Recent Results (from the past 240 hour(s))  Urine culture     Status: None   Collection Time: 02/15/15  4:15 PM  Result Value Ref Range Status   Specimen Description URINE, CLEAN CATCH  Final   Special Requests NONE  Final   Culture   Final    MULTIPLE SPECIES PRESENT, SUGGEST RECOLLECTION Performed at [facility]    Report Status 02/17/2015 FINAL  Final     Labs: Basic Metabolic Panel:  Recent Labs Lab 02/17/15 2010 02/18/15 0514 02/19/15 1130 02/20/15 0159 02/22/15 0500  NA 142 143 142 138 141  K 3.6 3.5 3.5 4.0 3.9  CL 108 111 109 108 110  CO2 25 24 26 22 27   GLUCOSE 126* 101* 109* 100* 95  BUN 30* 25* 19 21* 16  CREATININE 1.73* 1.10* 0.99 0.84 0.75  CALCIUM 8.9 8.4* 9.1 8.5* 8.7*   Liver Function Tests:  Recent Labs Lab 02/15/15 1644 02/18/15 0514  AST 29 20  ALT 16 15  ALKPHOS 77 61  BILITOT 0.6 0.3  PROT 9.1* 6.6  ALBUMIN 4.3 3.5   CBC:  Recent Labs Lab 02/15/15 1644 02/17/15 2010 02/18/15 0514 02/20/15 0159 02/21/15 0449 02/22/15 0500  WBC 11.2* 9.7 9.3 8.4 7.7 7.3  NEUTROABS 7.7 7.0  --   --   --   --   HGB 15.6* 11.3* 11.1* 10.5* 10.8* 10.6*  HCT 47.8* 36.2 34.8* 33.9* 34.8* 34.3*  MCV 81.4 81.9 82.7 82.5 83.1 81.9  PLT 187 178 163 149* 149* 155   Cardiac Enzymes:  Recent Labs Lab 02/15/15 1644  02/16/15 0300  02/18/15 1010 02/18/15 1644 02/19/15 1129 02/19/15 1850 02/20/15 0159  CKTOTAL 347*  --  281*  --   --   --   --   --   --   TROPONINI 0.28*  < > 0.25*  < > 0.22* 0.23* 0.24* 0.22* 0.21*  < > = values in this  interval not displayed. BNP: BNP (last 3 results)  Recent Labs  02/17/15 2010  BNP 190.3*    ProBNP (last 3 results) No results for input(s): PROBNP in the last 8760 hours.  CBG:  Recent Labs Lab 02/18/15 0742  GLUCAP 92     SIGNED: Time coordinating  discharge: 30 minutes  Debbora Presto, MD  [facility] 02/22/2015, 11:48 AM Pager [phone]  If 7PM-7AM, please contact night-coverage [email or web address] Password [email or web address]

## 2015-02-22 NOTE — Progress Notes (Signed)
PT Cancellation Note  Patient Details Name: [name] MRN: [id number] DOB: 1955-09-29   Cancelled Treatment:    Reason Eval/Treat Not Completed: Pain limiting ability to participate  Pt received IV morphine however reports pain limiting mobility, also states she was just OOB.  Pt declined PT.   Maida Sale E 02/22/2015, 11:59 AM [name], PT, DPT 02/22/2015 Pager: [phone]

## 2015-02-26 ENCOUNTER — Non-Acute Institutional Stay (SKILLED_NURSING_FACILITY): Payer: Medicaid Other | Admitting: Internal Medicine

## 2015-02-26 DIAGNOSIS — J9611 Chronic respiratory failure with hypoxia: Secondary | ICD-10-CM

## 2015-02-26 DIAGNOSIS — I10 Essential (primary) hypertension: Secondary | ICD-10-CM | POA: Diagnosis not present

## 2015-02-26 DIAGNOSIS — J42 Unspecified chronic bronchitis: Secondary | ICD-10-CM

## 2015-02-26 DIAGNOSIS — E785 Hyperlipidemia, unspecified: Secondary | ICD-10-CM | POA: Diagnosis not present

## 2015-02-26 DIAGNOSIS — Z8673 Personal history of transient ischemic attack (TIA), and cerebral infarction without residual deficits: Secondary | ICD-10-CM | POA: Diagnosis not present

## 2015-02-26 DIAGNOSIS — K219 Gastro-esophageal reflux disease without esophagitis: Secondary | ICD-10-CM

## 2015-02-26 DIAGNOSIS — I428 Other cardiomyopathies: Secondary | ICD-10-CM

## 2015-02-26 DIAGNOSIS — I429 Cardiomyopathy, unspecified: Secondary | ICD-10-CM | POA: Diagnosis not present

## 2015-02-26 DIAGNOSIS — M150 Primary generalized (osteo)arthritis: Secondary | ICD-10-CM | POA: Diagnosis not present

## 2015-02-26 DIAGNOSIS — M159 Polyosteoarthritis, unspecified: Secondary | ICD-10-CM

## 2015-02-27 ENCOUNTER — Encounter: Payer: Self-pay | Admitting: Internal Medicine

## 2015-03-05 ENCOUNTER — Ambulatory Visit: Payer: Self-pay | Admitting: Physician Assistant

## 2015-03-07 ENCOUNTER — Ambulatory Visit: Payer: Self-pay | Admitting: Internal Medicine

## 2015-03-13 ENCOUNTER — Ambulatory Visit: Payer: Self-pay | Admitting: Internal Medicine

## 2015-03-14 ENCOUNTER — Non-Acute Institutional Stay (SKILLED_NURSING_FACILITY): Payer: Medicaid Other | Admitting: Adult Health

## 2015-03-14 DIAGNOSIS — I119 Hypertensive heart disease without heart failure: Secondary | ICD-10-CM

## 2015-03-14 DIAGNOSIS — J189 Pneumonia, unspecified organism: Secondary | ICD-10-CM

## 2015-03-15 ENCOUNTER — Encounter: Payer: Self-pay | Admitting: Internal Medicine

## 2015-03-20 ENCOUNTER — Non-Acute Institutional Stay (SKILLED_NURSING_FACILITY): Payer: Medicaid Other | Admitting: Adult Health

## 2015-03-20 ENCOUNTER — Encounter: Payer: Self-pay | Admitting: Adult Health

## 2015-03-20 DIAGNOSIS — J189 Pneumonia, unspecified organism: Secondary | ICD-10-CM | POA: Diagnosis not present

## 2015-03-20 DIAGNOSIS — J449 Chronic obstructive pulmonary disease, unspecified: Secondary | ICD-10-CM

## 2015-03-20 DIAGNOSIS — R531 Weakness: Secondary | ICD-10-CM

## 2015-03-20 DIAGNOSIS — J9611 Chronic respiratory failure with hypoxia: Secondary | ICD-10-CM

## 2015-03-20 NOTE — Progress Notes (Signed)
Patient ID: [name], female   DOB: 1956-04-24, 59 y.o.   MRN: [id number]    Facility: [facility]      Allergies  Allergen Reactions  . Penicillins Itching and Nausea And Vomiting    Has patient had a PCN reaction causing immediate rash, facial/tongue/throat swelling, SOB or lightheadedness with hypotension: No Has patient had a PCN reaction causing severe rash involving mucus membranes or skin necrosis: No Has patient had a PCN reaction that required hospitalization: No Has patient had a PCN reaction occurring within the last 10 years: No      Chief Complaint  Patient presents with  . Acute Visit    pneumonia     HPI:  She had a chest x-ray done yesterday which demonstrates right side pneumonia. She is lethargic this morning and is unable to participate in the hpi or ros. Staff reports that she has been more short of breath in the past several days. There are no reports of fever present. Her blood pressure readings have been elevated.    Past Medical History  Diagnosis Date  . Arthritis   . Stroke (HCC)   . Coronary artery disease     a. remote hx of cath ~2000. B. cath  moderate disease in an RPL branch - too distal for PCI; 3rd RPLB lesion, 50% stenosed; hyperdynamic LV with near cavity obliteration & severely elevated LVEDP  . Asthma   . 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 Non-ischemic cardiomyopathy (HCC)     cxr - mild cardiomegly, Echo 10/07/14 - severe LVH with LVEF 65-70%.  . Hypertension   . Heart attack (HCC)   . Chronic bronchitis (HCC)   . Heart murmur   . Wheelchair bound   . Bilateral chronic knee pain   . Accelerated hypertension     Past Surgical History  Procedure Laterality Date  . Abdominal hysterectomy    . I&d extremity  08/18/2011    Procedure: IRRIGATION AND DEBRIDEMENT EXTREMITY;  Surgeon: Sharma Covert, MD;  Location: [facility] OR;  Service: Orthopedics;   Laterality: Right;  I&D Right Long Finger  . Amputation  08/21/2011    Procedure: AMPUTATION DIGIT;  Surgeon: Sharma Covert, MD;  Location: [facility] OR;  Service: Orthopedics;  Laterality: Right;  . Flexible sigmoidoscopy  04/28/2012    Procedure: FLEXIBLE SIGMOIDOSCOPY;  Surgeon: Petra Kuba, MD;  Location: [facility] ENDOSCOPY;  Service: Endoscopy;  Laterality: N/A;  unsedated, unprepped  . Cardiac catheterization N/A 10/09/2014    Procedure: Left Heart Cath and Coronary Angiography;  Surgeon: Marykay Lex, MD;  Location: [facility] INVASIVE CV LAB CUPID;  Service: Cardiovascular;  Laterality: N/A;  . Coronary stent placement    . Cardiac catheterization  10/2014    normal coronary arteries aside for a distal RPLB branch with 50% stenosis    VITAL SIGNS BP 150/87 mmHg  Pulse 87  Resp 20  Ht 5\' 2"  (1.575 m)  Wt 273 lb (123.832 kg)  BMI 49.92 kg/m2  SpO2 96%  Patient's Medications  New Prescriptions   No medications on file  Previous Medications   ALBUTEROL (PROVENTIL HFA;VENTOLIN HFA) 108 (90 BASE) MCG/ACT INHALER    1 to 2 puffs every 4 to 6 hours as needed   ALBUTEROL (PROVENTIL) (2.5 MG/3ML) 0.083% NEBULIZER SOLUTION    Take 3 mLs (2.5 mg total) by nebulization every 4 (four) hours as needed for wheezing or shortness  of breath.   AMLODIPINE (NORVASC) 5 MG TABLET    Take 2 tablets (10 mg total) by mouth daily.   ASPIRIN EC 81 MG EC TABLET    Take 1 tablet (81 mg total) by mouth daily.   ATORVASTATIN (LIPITOR) 20 MG TABLET    Take 1 tablet (20 mg total) by mouth daily at 6 PM.   DOXAZOSIN (CARDURA) 4 MG TABLET    Take 1 tablet (4 mg total) by mouth at bedtime.   FAMOTIDINE (PEPCID) 20 MG TABLET    One at bedtime   HYDRALAZINE (APRESOLINE) 50 MG TABLET    Take 1 tablet (50 mg total) by mouth 3 (three) times daily.   ISOSORBIDE MONONITRATE (IMDUR) 30 MG 24 HR TABLET    Take 1 tablet (30 mg total) by mouth daily.   METOPROLOL SUCCINATE (TOPROL-XL) 25 MG 24 HR TABLET    Take 25 mg daily .   NITROGLYCERIN  (NITROSTAT) 0.4 MG SL TABLET    Place 1 tablet (0.4 mg total) under the tongue every 5 (five) minutes as needed for chest pain (Do not give more than 3 SL tablets in 15 minutes.).   OXYCODONE-ACETAMINOPHEN (PERCOCET/ROXICET) 5-325 MG PER TABLET    Take 2 tablets by mouth every 6 (six) hours as needed for moderate pain.  Modified Medications   No medications on file  Discontinued Medications   No medications on file     SIGNIFICANT DIAGNOSTIC EXAMS  03-13-15: chest x-ray: cardiomegaly with patchy right lung infiltrates    LABS REVIEWED:   02-28-15: hgb 13.3; hct 43.8  glucose 82; bun 18.5; creat 0.72; k+ 4.4; na++140; hgb a1c 5.9    Review of Systems  Unable to perform ROS: Acuity of condition      Physical Exam  Vitals reviewed. Constitutional: No distress.  Obese   Eyes: Conjunctivae are normal.  Neck: Neck supple. No JVD present. No thyromegaly present.  Cardiovascular: Normal rate, regular rhythm and intact distal pulses.   Respiratory: Effort normal. No respiratory distress.  Diminished breath sounds on right side Is 02 dependent   GI: Soft. Bowel sounds are normal. She exhibits no distension. There is no tenderness.  Musculoskeletal: She exhibits no edema.  Able to move all extremities   Lymphadenopathy:    She has no cervical adenopathy.  Neurological:  Lethargic   Skin: Skin is warm and dry. She is not diaphoretic.      ASSESSMENT/ PLAN:  1. Hypertension: will increase her toprol xl to 50 mg daily and will monitor her status.   2. Pneumonia: will continue levaquin for a total of 10 days with florastor twice daily for 2 weeks and will monitor her status.      Synthia Innocent NP [facility]  Contact [phone] Monday through Friday 8am- 5pm  After hours call [phone]

## 2015-03-20 NOTE — Progress Notes (Signed)
Patient ID: [name], female   DOB: 1956/05/16, 59 y.o.   MRN: [id number]   Facility: [facility]      Allergies  Allergen Reactions  . Penicillins Itching and Nausea And Vomiting    Has patient had a PCN reaction causing immediate rash, facial/tongue/throat swelling, SOB or lightheadedness with hypotension: No Has patient had a PCN reaction causing severe rash involving mucus membranes or skin necrosis: No Has patient had a PCN reaction that required hospitalization: No Has patient had a PCN reaction occurring within the last 10 years: No      Chief Complaint  Patient presents with  . Discharge Note    HPI:  She is being discharged to home with home health for pt/ot/rn. She will need a wheelchair. She is on chronic 02 therapy. She will need her prescriptions to be written and will need a follow up with her pcp.  She had been hospitalized for lower extremity pain and physical deconditioning. She was admitted to this facility for short term rehab and is ready to return back home.    Past Medical History  Diagnosis Date  . Arthritis   . Stroke ([place])   . Coronary artery disease     a. remote hx of cath ~2000. B. cath  moderate disease in an RPL branch - too distal for PCI; 3rd RPLB lesion, 50% stenosed; hyperdynamic LV with near cavity obliteration & severely elevated LVEDP  . Asthma   . 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 Non-ischemic cardiomyopathy ([place])     cxr - mild cardiomegly, Echo 10/07/14 - severe LVH with LVEF 65-70%.  . Hypertension   . Heart attack ([place])   . Chronic bronchitis ([place])   . Heart murmur   . Wheelchair bound   . Bilateral chronic knee pain   . Accelerated hypertension     Past Surgical History  Procedure Laterality Date  . Abdominal hysterectomy    . I&d extremity  08/18/2011    Procedure: IRRIGATION AND DEBRIDEMENT EXTREMITY;  Surgeon: Linna Hoff, MD;  Location:  [facility];  Service: Orthopedics;  Laterality: Right;  I&D Right Long Finger  . Amputation  08/21/2011    Procedure: AMPUTATION DIGIT;  Surgeon: Linna Hoff, MD;  Location: [facility];  Service: Orthopedics;  Laterality: Right;  . Flexible sigmoidoscopy  04/28/2012    Procedure: FLEXIBLE SIGMOIDOSCOPY;  Surgeon: Jeryl Columbia, MD;  Location: [facility];  Service: Endoscopy;  Laterality: N/A;  unsedated, unprepped  . Cardiac catheterization N/A 10/09/2014    Procedure: Left Heart Cath and Coronary Angiography;  Surgeon: Leonie Man, MD;  Location: [facility] INVASIVE CV LAB CUPID;  Service: Cardiovascular;  Laterality: N/A;  . Coronary stent placement    . Cardiac catheterization  10/2014    normal coronary arteries aside for a distal RPLB branch with 50% stenosis    VITAL SIGNS BP 140/80 mmHg  Pulse 78  Ht _0  (1.575 m)  Wt 273 lb (123.832 kg)  BMI 49.92 kg/m2  SpO2 95%  Patient's Medications  New Prescriptions   No medications on file  Previous Medications   ALBUTEROL (PROVENTIL HFA;VENTOLIN HFA) 108 (90 BASE) MCG/ACT INHALER    1 to 2 puffs every 4 to 6 hours as needed   ALBUTEROL (PROVENTIL) (2.5 MG/3ML) 0.083% NEBULIZER SOLUTION    Take 3 mLs (2.5 mg total) by nebulization every 4 (four) hours as needed for  wheezing or shortness of breath.   AMLODIPINE (NORVASC) 5 MG TABLET    Take 2 tablets (10 mg total) by mouth daily.   ASPIRIN EC 81 MG EC TABLET    Take 1 tablet (81 mg total) by mouth daily.   ATORVASTATIN (LIPITOR) 20 MG TABLET    Take 1 tablet (20 mg total) by mouth daily at 6 PM.   DOXAZOSIN (CARDURA) 4 MG TABLET    Take 1 tablet (4 mg total) by mouth at bedtime.   FAMOTIDINE (PEPCID) 20 MG TABLET    One at bedtime   HYDRALAZINE (APRESOLINE) 50 MG TABLET    Take 1 tablet (50 mg total) by mouth 3 (three) times daily.   ISOSORBIDE MONONITRATE (IMDUR) 30 MG 24 HR TABLET    Take 1 tablet (30 mg total) by mouth daily.   METOPROLOL SUCCINATE (TOPROL-XL) 25 MG 24 HR TABLET    Take 2 tablets  (50 mg total) by mouth daily.   NITROGLYCERIN (NITROSTAT) 0.4 MG SL TABLET    Place 1 tablet (0.4 mg total) under the tongue every 5 (five) minutes as needed for chest pain (Do not give more than 3 SL tablets in 15 minutes.).   OXYCODONE-ACETAMINOPHEN (PERCOCET/ROXICET) 5-325 MG PER TABLET    Take 2 tablets by mouth every 6 (six) hours as needed for moderate pain.  Modified Medications   No medications on file  Discontinued Medications   No medications on file     SIGNIFICANT DIAGNOSTIC EXAMS  03-13-15: chest x-ray: cardiomegaly with patchy right lung infiltrates    LABS REVIEWED:   02-28-15: hgb 13.3; hct 43.8  glucose 82; bun 18.5; creat 0.72; k+ 4.4; na++140; hgb a1c 5.9    Review of Systems  Constitutional: Negative for fatigue.  HENT: Negative for congestion.   Respiratory: Negative for cough, chest tightness and shortness of breath.   Cardiovascular: Negative for chest pain, palpitations and leg swelling.  Gastrointestinal: Negative for abdominal pain and constipation.  Musculoskeletal: Negative for myalgias and arthralgias.  Skin: Negative for pallor.  Neurological: Negative for dizziness.  Psychiatric/Behavioral: The patient is not nervous/anxious.       Physical Exam Vitals reviewed. Constitutional: No distress.  Obese   Eyes: Conjunctivae are normal.  Neck: Neck supple. No JVD present. No thyromegaly present.  Cardiovascular: Normal rate, regular rhythm and intact distal pulses.   Respiratory: Effort normal. No respiratory distress.  Diminished breath sounds  Is 02 dependent   GI: Soft. Bowel sounds are normal. She exhibits no distension. There is no tenderness.  Musculoskeletal: She exhibits no edema.  Able to move all extremities   Lymphadenopathy:    She has no cervical adenopathy.  Neurological:  Lethargic   Skin: Skin is warm and dry. She is not diaphoretic.     ASSESSMENT/ PLAN:  Will discharge her to home with home health for pt/ot/rn to  evaluate and treat as indicated for gait; balance; adl training and disease management. She will need a standard bariatric wheelchair in order for her to maintain her current level of independence with her adl's which cannot be met with a walker. She can self propel.  Her prescriptions have been written for a 30 day supply of her medications with #40 percocet 5/325 mg tabs. The facility will setup her follow up with her pcp   Time spent with patient  45  minutes >50% time spent counseling; reviewing medical record; tests; labs; and developing future plan of care   Ok Edwards NP [facility]  Contact [phone] Monday through Friday 8am- 5pm  After hours call [phone]

## 2015-03-27 ENCOUNTER — Ambulatory Visit: Payer: Self-pay | Admitting: Internal Medicine

## 2015-04-20 ENCOUNTER — Ambulatory Visit: Payer: Self-pay | Admitting: Cardiovascular Disease

## 2015-05-10 ENCOUNTER — Ambulatory Visit: Payer: Medicaid Other | Admitting: Cardiovascular Disease

## 2015-05-24 ENCOUNTER — Emergency Department (HOSPITAL_COMMUNITY)
Admission: EM | Admit: 2015-05-24 | Discharge: 2015-05-24 | Disposition: A | Discharge status: Home / Self Care | Payer: Medicaid Other | Attending: Emergency Medicine | Admitting: Emergency Medicine

## 2015-05-24 ENCOUNTER — Encounter (HOSPITAL_COMMUNITY): Payer: Self-pay

## 2015-05-24 ENCOUNTER — Emergency Department (HOSPITAL_COMMUNITY): Payer: Medicaid Other

## 2015-05-24 DIAGNOSIS — I2510 Atherosclerotic heart disease of native coronary artery without angina pectoris: Secondary | ICD-10-CM | POA: Insufficient documentation

## 2015-05-24 DIAGNOSIS — Z9889 Other specified postprocedural states: Secondary | ICD-10-CM | POA: Insufficient documentation

## 2015-05-24 DIAGNOSIS — G8929 Other chronic pain: Secondary | ICD-10-CM | POA: Insufficient documentation

## 2015-05-24 DIAGNOSIS — Z880 Allergy status to penicillin: Secondary | ICD-10-CM | POA: Diagnosis not present

## 2015-05-24 DIAGNOSIS — F17210 Nicotine dependence, cigarettes, uncomplicated: Secondary | ICD-10-CM | POA: Diagnosis not present

## 2015-05-24 DIAGNOSIS — Z9119 Patient's noncompliance with other medical treatment and regimen: Secondary | ICD-10-CM | POA: Insufficient documentation

## 2015-05-24 DIAGNOSIS — Z79899 Other long term (current) drug therapy: Secondary | ICD-10-CM | POA: Diagnosis not present

## 2015-05-24 DIAGNOSIS — Z8673 Personal history of transient ischemic attack (TIA), and cerebral infarction without residual deficits: Secondary | ICD-10-CM | POA: Insufficient documentation

## 2015-05-24 DIAGNOSIS — Z7982 Long term (current) use of aspirin: Secondary | ICD-10-CM | POA: Insufficient documentation

## 2015-05-24 DIAGNOSIS — I10 Essential (primary) hypertension: Secondary | ICD-10-CM | POA: Diagnosis not present

## 2015-05-24 DIAGNOSIS — R1084 Generalized abdominal pain: Secondary | ICD-10-CM | POA: Diagnosis present

## 2015-05-24 DIAGNOSIS — M1990 Unspecified osteoarthritis, unspecified site: Secondary | ICD-10-CM | POA: Insufficient documentation

## 2015-05-24 DIAGNOSIS — R011 Cardiac murmur, unspecified: Secondary | ICD-10-CM | POA: Diagnosis not present

## 2015-05-24 DIAGNOSIS — K5792 Diverticulitis of intestine, part unspecified, without perforation or abscess without bleeding: Secondary | ICD-10-CM | POA: Insufficient documentation

## 2015-05-24 DIAGNOSIS — Z9861 Coronary angioplasty status: Secondary | ICD-10-CM | POA: Insufficient documentation

## 2015-05-24 DIAGNOSIS — J45909 Unspecified asthma, uncomplicated: Secondary | ICD-10-CM | POA: Insufficient documentation

## 2015-05-24 LAB — CBC WITH DIFFERENTIAL/PLATELET
Basophils Absolute: 0 10*3/uL (ref 0.0–0.1)
Basophils Relative: 0 %
EOS PCT: 1 %
Eosinophils Absolute: 0.1 10*3/uL (ref 0.0–0.7)
HCT: 41.8 % (ref 36.0–46.0)
HEMOGLOBIN: 13.3 g/dL (ref 12.0–15.0)
LYMPHS ABS: 3 10*3/uL (ref 0.7–4.0)
LYMPHS PCT: 19 %
MCH: 25.9 pg — AB (ref 26.0–34.0)
MCHC: 31.8 g/dL (ref 30.0–36.0)
MCV: 81.3 fL (ref 78.0–100.0)
Monocytes Absolute: 1 10*3/uL (ref 0.1–1.0)
Monocytes Relative: 6 %
NEUTROS PCT: 74 %
Neutro Abs: 11.5 10*3/uL — ABNORMAL HIGH (ref 1.7–7.7)
Platelets: 150 10*3/uL (ref 150–400)
RBC: 5.14 MIL/uL — AB (ref 3.87–5.11)
RDW: 17.4 % — ABNORMAL HIGH (ref 11.5–15.5)
WBC: 15.5 10*3/uL — AB (ref 4.0–10.5)

## 2015-05-24 LAB — COMPREHENSIVE METABOLIC PANEL
ALK PHOS: 77 U/L (ref 38–126)
ALT: 12 U/L — AB (ref 14–54)
AST: 17 U/L (ref 15–41)
Albumin: 3.6 g/dL (ref 3.5–5.0)
Anion gap: 9 (ref 5–15)
BILIRUBIN TOTAL: 0.5 mg/dL (ref 0.3–1.2)
BUN: 20 mg/dL (ref 6–20)
CALCIUM: 9.2 mg/dL (ref 8.9–10.3)
CO2: 25 mmol/L (ref 22–32)
CREATININE: 0.86 mg/dL (ref 0.44–1.00)
Chloride: 110 mmol/L (ref 101–111)
Glucose, Bld: 125 mg/dL — ABNORMAL HIGH (ref 65–99)
Potassium: 3.6 mmol/L (ref 3.5–5.1)
Sodium: 144 mmol/L (ref 135–145)
Total Protein: 7.8 g/dL (ref 6.5–8.1)

## 2015-05-24 LAB — LIPASE, BLOOD: LIPASE: 29 U/L (ref 11–51)

## 2015-05-24 MED ORDER — SODIUM CHLORIDE 0.9 % IV SOLN
INTRAVENOUS | Status: DC
  Administered 2015-05-24: 15:00:00 via INTRAVENOUS

## 2015-05-24 MED ORDER — CIPROFLOXACIN HCL 500 MG PO TABS: 500.0000 mg | ORAL_TABLET | Freq: Two times a day (BID) | ORAL | Status: DC

## 2015-05-24 MED ORDER — MORPHINE SULFATE (PF) 4 MG/ML IV SOLN: 8.0000 mg | Freq: Once | INTRAVENOUS | Status: DC

## 2015-05-24 MED ORDER — HYDRALAZINE HCL 50 MG PO TABS
50.0000 mg | ORAL_TABLET | Freq: Once | ORAL | Status: AC
  Administered 2015-05-24: 50 mg via ORAL
  Filled 2015-05-24: qty 1

## 2015-05-24 MED ORDER — IOHEXOL 300 MG/ML  SOLN
100.0000 mL | Freq: Once | INTRAMUSCULAR | Status: AC | PRN
  Administered 2015-05-24: 100 mL via INTRAVENOUS

## 2015-05-24 MED ORDER — ISOSORBIDE MONONITRATE ER 30 MG PO TB24
30.0000 mg | ORAL_TABLET | Freq: Once | ORAL | Status: AC
  Administered 2015-05-24: 30 mg via ORAL
  Filled 2015-05-24: qty 1

## 2015-05-24 MED ORDER — GI COCKTAIL ~~LOC~~
30.0000 mL | Freq: Once | ORAL | Status: AC
  Administered 2015-05-24: 30 mL via ORAL
  Filled 2015-05-24: qty 30

## 2015-05-24 MED ORDER — METOPROLOL SUCCINATE ER 50 MG PO TB24
50.0000 mg | ORAL_TABLET | Freq: Every day | ORAL | Status: DC
  Administered 2015-05-24: 50 mg via ORAL
  Filled 2015-05-24: qty 1

## 2015-05-24 MED ORDER — METRONIDAZOLE 500 MG PO TABS: 500.0000 mg | ORAL_TABLET | Freq: Three times a day (TID) | ORAL | Status: DC

## 2015-05-24 MED ORDER — AMLODIPINE BESYLATE 10 MG PO TABS
10.0000 mg | ORAL_TABLET | Freq: Every day | ORAL | Status: DC
  Administered 2015-05-24: 10 mg via ORAL
  Filled 2015-05-24: qty 1

## 2015-05-24 NOTE — ED Notes (Signed)
Pt cannot use restroom at this time, aware specimen is needed. 

## 2015-05-24 NOTE — ED Notes (Signed)
Per EMS, patient from home c/o lower abdominal pain.  Patient stated that ate spicy african food last night.  Patient has Hx of Diverticulitis.  Patient told EMS that has not had N/V/D and not had a BM since yesterday.  Per EMS patient c/o pain when you push on her abdomen.  Patient has a Hx of cardiac stent placed 3 months ago, MI, and Diverticulitis.

## 2015-05-24 NOTE — Discharge Instructions (Signed)
Diverticulitis °Diverticulitis is inflammation or infection of small pouches in your colon that form when you have a condition called diverticulosis. The pouches in your colon are called diverticula. Your colon, or large intestine, is where water is absorbed and stool is formed. °Complications of diverticulitis can include: °· Bleeding. °· Severe infection. °· Severe pain. °· Perforation of your colon. °· Obstruction of your colon. °CAUSES  °Diverticulitis is caused by bacteria. °Diverticulitis happens when stool becomes trapped in diverticula. This allows bacteria to grow in the diverticula, which can lead to inflammation and infection. °RISK FACTORS °People with diverticulosis are at risk for diverticulitis. Eating a diet that does not include enough fiber from fruits and vegetables may make diverticulitis more likely to develop. °SYMPTOMS  °Symptoms of diverticulitis may include: °· Abdominal pain and tenderness. The pain is normally located on the left side of the abdomen, but may occur in other areas. °· Fever and chills. °· Bloating. °· Cramping. °· Nausea. °· Vomiting. °· Constipation. °· Diarrhea. °· Blood in your stool. °DIAGNOSIS  °Your health care provider will ask you about your medical history and do a physical exam. You may need to have tests done because many medical conditions can cause the same symptoms as diverticulitis. Tests may include: °· Blood tests. °· Urine tests. °· Imaging tests of the abdomen, including X-rays and CT scans. °When your condition is under control, your health care provider may recommend that you have a colonoscopy. A colonoscopy can show how severe your diverticula are and whether something else is causing your symptoms. °TREATMENT  °Most cases of diverticulitis are mild and can be treated at home. Treatment may include: °· Taking over-the-counter pain medicines. °· Following a clear liquid diet. °· Taking antibiotic medicines by mouth for 7-10 days. °More severe cases may  be treated at a hospital. Treatment may include: °· Not eating or drinking. °· Taking prescription pain medicine. °· Receiving antibiotic medicines through an IV tube. °· Receiving fluids and nutrition through an IV tube. °· Surgery. °HOME CARE INSTRUCTIONS  °· Follow your health care provider's instructions carefully. °· Follow a full liquid diet or other diet as directed by your health care provider. After your symptoms improve, your health care provider may tell you to change your diet. He or she may recommend you eat a high-fiber diet. Fruits and vegetables are good sources of fiber. Fiber makes it easier to pass stool. °· Take fiber supplements or probiotics as directed by your health care provider. °· Only take medicines as directed by your health care provider. °· Keep all your follow-up appointments. °SEEK MEDICAL CARE IF:  °· Your pain does not improve. °· You have a hard time eating food. °· Your bowel movements do not return to normal. °SEEK IMMEDIATE MEDICAL CARE IF:  °· Your pain becomes worse. °· Your symptoms do not get better. °· Your symptoms suddenly get worse. °· You have a fever. °· You have repeated vomiting. °· You have bloody or black, tarry stools. °MAKE SURE YOU:  °· Understand these instructions. °· Will watch your condition. °· Will get help right away if you are not doing well or get worse. °  °This information is not intended to replace advice given to you by your health care provider. Make sure you discuss any questions you have with your health care provider. °  °Document Released: 03/05/2005 Document Revised: 05/31/2013 Document Reviewed: 04/20/2013 °Elsevier Interactive Patient Education ©2016 [employer] ° °

## 2015-05-24 NOTE — ED Notes (Signed)
Bed: WA03 Expected date:  Expected time:  Means of arrival:  Comments: Low abd pain

## 2015-05-24 NOTE — ED Notes (Signed)
Patient noted to be asleep.  Patient was woke up by this RN to take her medications.

## 2015-05-24 NOTE — ED Provider Notes (Signed)
CSN: [id number]     Arrival date & time 05/24/15  1347 History   First MD Initiated Contact with Patient 05/24/15 1459     Chief Complaint  Patient presents with  . Abdominal Pain  . Hypertension     (Consider location/radiation/quality/duration/timing/severity/associated sxs/prior Treatment) Patient is a 59 y.o. female presenting with abdominal pain and hypertension. The history is provided by the patient.  Abdominal Pain Pain location:  Generalized Pain quality: aching   Pain radiates to:  Does not radiate Pain severity:  Moderate Onset quality:  Gradual Duration:  1 day Timing:  Constant Progression:  Unchanged Chronicity:  New Context: not alcohol use and not previous surgeries   Relieved by:  Nothing Worsened by:  Nothing tried Ineffective treatments:  None tried Associated symptoms: no chills, no constipation, no diarrhea, no dysuria, no fever, no hematochezia and no vomiting   Risk factors: obesity   Hypertension Associated symptoms include abdominal pain.    Past Medical History  Diagnosis Date  . Arthritis   . Stroke (HCC)   . Coronary artery disease     a. remote hx of cath ~2000. B. cath  moderate disease in an RPL branch - too distal for PCI; 3rd RPLB lesion, 50% stenosed; hyperdynamic LV with near cavity obliteration & severely elevated LVEDP  . Asthma   . 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 Non-ischemic cardiomyopathy (HCC)     cxr - mild cardiomegly, Echo 10/07/14 - severe LVH with LVEF 65-70%.  . Hypertension   . Heart attack (HCC)   . Chronic bronchitis (HCC)   . Heart murmur   . Wheelchair bound   . Bilateral chronic knee pain   . Accelerated hypertension    Past Surgical History  Procedure Laterality Date  . Abdominal hysterectomy    . I&d extremity  08/18/2011    Procedure: IRRIGATION AND DEBRIDEMENT EXTREMITY;  Surgeon: Sharma Covert, MD;  Location: [facility] OR;  Service: Orthopedics;   Laterality: Right;  I&D Right Long Finger  . Amputation  08/21/2011    Procedure: AMPUTATION DIGIT;  Surgeon: Sharma Covert, MD;  Location: [facility] OR;  Service: Orthopedics;  Laterality: Right;  . Flexible sigmoidoscopy  04/28/2012    Procedure: FLEXIBLE SIGMOIDOSCOPY;  Surgeon: Petra Kuba, MD;  Location: [facility]COPY;  Service: Endoscopy;  Laterality: N/A;  unsedated, unprepped  . Cardiac catheterization N/A 10/09/2014    Procedure: Left Heart Cath and Coronary Angiography;  Surgeon: Marykay Lex, MD;  Location: [facility] INVASIVE CV LAB CUPID;  Service: Cardiovascular;  Laterality: N/A;  . Coronary stent placement    . Cardiac catheterization  10/2014    normal coronary arteries aside for a distal RPLB branch with 50% stenosis   Family History  Problem Relation Age of Onset  . Diabetes type I Mother   . Hypertension Mother   . Arthritis Mother   . Stroke Sister   . Diabetes type I Brother   . Hypertension Brother   . Healthy Brother   . Diabetes type II Brother   . Heart disease Brother   . Healthy Brother   . Healthy Brother   . Diabetes type I Sister   . Hypertension Sister   . Asthma Sister   . Bronchitis Sister    Social History  Substance Use Topics  . Smoking status: Current Every Day Smoker -- 0.50 packs/day for 20 years    Types: Cigarettes  .  Smokeless tobacco: Former Neurosurgeon    Types: Snuff  . Alcohol Use: 3.6 oz/week    6 Cans of beer per week     Comment: 04/27/12- "amount varies"   OB History    Gravida Para Term Preterm AB TAB SAB Ectopic Multiple Living   0 0 0 0 0 0 0 0       Review of Systems  Constitutional: Negative for fever and chills.  Gastrointestinal: Positive for abdominal pain. Negative for vomiting, diarrhea, constipation and hematochezia.  Genitourinary: Negative for dysuria.  All other systems reviewed and are negative.     Allergies  Penicillins  Home Medications   Prior to Admission medications   Medication Sig Start Date End Date  Taking? Authorizing Provider  albuterol (PROVENTIL HFA;VENTOLIN HFA) 108 (90 BASE) MCG/ACT inhaler 1 to 2 puffs every 4 to 6 hours as needed Patient taking differently: Inhale 1-2 puffs into the lungs every 4 (four) hours as needed for wheezing or shortness of breath.  12/26/14  Yes Scott T Alben Spittle, PA-C  amLODipine (NORVASC) 5 MG tablet Take 2 tablets (10 mg total) by mouth daily. 02/17/15  Yes Calvert Cantor, MD  baclofen (LIORESAL) 10 MG tablet Take 10 mg by mouth 3 (three) times daily.   Yes Historical Provider, MD  Ibuprofen-Diphenhydramine Cit (ADVIL PM PO) Take 2 tablets by mouth at bedtime.    Yes Historical Provider, MD  albuterol (PROVENTIL) (2.5 MG/3ML) 0.083% nebulizer solution Take 3 mLs (2.5 mg total) by nebulization every 4 (four) hours as needed for wheezing or shortness of breath. Patient not taking: Reported on 05/24/2015 02/22/15   Dorothea Ogle, MD  aspirin EC 81 MG EC tablet Take 1 tablet (81 mg total) by mouth daily. 10/10/14   Bhavinkumar Bhagat, PA  atorvastatin (LIPITOR) 20 MG tablet Take 1 tablet (20 mg total) by mouth daily at 6 PM. 02/17/15   Calvert Cantor, MD  doxazosin (CARDURA) 4 MG tablet Take 1 tablet (4 mg total) by mouth at bedtime. 02/17/15   Calvert Cantor, MD  famotidine (PEPCID) 20 MG tablet One at bedtime Patient taking differently: Take 20 mg by mouth at bedtime.  02/17/15   Calvert Cantor, MD  hydrALAZINE (APRESOLINE) 50 MG tablet Take 1 tablet (50 mg total) by mouth 3 (three) times daily. 02/17/15   Calvert Cantor, MD  isosorbide mononitrate (IMDUR) 30 MG 24 hr tablet Take 1 tablet (30 mg total) by mouth daily. 02/17/15   Calvert Cantor, MD  metoprolol succinate (TOPROL-XL) 25 MG 24 hr tablet Take 3 tablets (75 mg total) by mouth daily. Patient taking differently: Take 50 mg by mouth daily.  02/22/15   Dorothea Ogle, MD  nitroGLYCERIN (NITROSTAT) 0.4 MG SL tablet Place 1 tablet (0.4 mg total) under the tongue every 5 (five) minutes as needed for chest pain (Do not give more than  3 SL tablets in 15 minutes.). 02/22/15   Dorothea Ogle, MD  oxyCODONE-acetaminophen (PERCOCET/ROXICET) 5-325 MG per tablet Take 2 tablets by mouth every 6 (six) hours as needed for moderate pain. 02/22/15   Dorothea Ogle, MD   BP 226/82 mmHg  Pulse 65  Temp(Src) 98 F (36.7 C) (Oral)  Resp 24  SpO2 99% Physical Exam  Constitutional: She is oriented to person, place, and time. She appears well-developed and well-nourished. No distress.  HENT:  Head: Normocephalic.  Eyes: Conjunctivae are normal.  Neck: Neck supple. No tracheal deviation present.  Cardiovascular: Normal rate, regular rhythm and normal heart sounds.  Pulmonary/Chest: Effort normal and breath sounds normal. No respiratory distress.  Abdominal: Soft. She exhibits no distension. There is tenderness (mild diffuse).  Obese abdomen, nonfocal exam  Neurological: She is alert and oriented to person, place, and time.  Skin: Skin is warm and dry.  Psychiatric: She has a normal mood and affect.    ED Course  Procedures (including critical care time) Labs Review Labs Reviewed  CBC WITH DIFFERENTIAL/PLATELET - Abnormal; Notable for the following:    WBC 15.5 (*)    RBC 5.14 (*)    MCH 25.9 (*)    RDW 17.4 (*)    Neutro Abs 11.5 (*)    All other components within normal limits  COMPREHENSIVE METABOLIC PANEL - Abnormal; Notable for the following:    Glucose, Bld 125 (*)    ALT 12 (*)    All other components within normal limits  LIPASE, BLOOD    Imaging Review Ct Abdomen Pelvis W Contrast  05/24/2015  CLINICAL DATA:  Lower abdominal pain. EXAM: CT ABDOMEN AND PELVIS WITH CONTRAST TECHNIQUE: Multidetector CT imaging of the abdomen and pelvis was performed using the standard protocol following bolus administration of intravenous contrast. CONTRAST:  OMNIPAQUE IOHEXOL 300 MG/ML  SOLN COMPARISON:  CT scan of February 23, 2008. FINDINGS: Multilevel degenerative disc disease is noted in the lumbar spine. Visualized lung  bases are unremarkable. Status post cholecystectomy. The liver, spleen and pancreas appear normal. Adrenal glands and kidneys are unremarkable. No hydronephrosis or renal obstruction is noted. No renal or ureteral calculi are noted. Atherosclerosis of abdominal aorta is noted without aneurysm formation. There is no evidence of bowel obstruction. Sigmoid diverticulitis is noted. No abnormal fluid collection is noted. Urinary bladder is decompressed. Status post hysterectomy. Ovaries are not well visualized. No significant adenopathy is noted. IMPRESSION: Acute sigmoid diverticulitis is noted. No definite abscess is noted. Electronically Signed   By: Lupita Raider, M.D.   On: 05/24/2015 18:11   I have personally reviewed and evaluated these images and lab results as part of my medical decision-making.   EKG Interpretation None      MDM   Final diagnoses:  Acute diverticulitis    59 y.o. female presents with diffuse lower abdominal pain after eating spicy food last night. Known history of diverticulosis and diverticulitis. Has not taken any of her medicine and has history of noncompliance. Hypertensive here c/w pain and noncompliance with medication. Difficult exam due to habitus, CT scan c/w diverticulitis, Pt with elevated WBC but no signs of complicated infection. Prescribed cipro/flagyl for home and recommended follow up with PCP for recheck. Return with worsening pain, fevers, or other concerning symptoms.    Lyndal Pulley, MD 05/25/15 1344

## 2015-07-18 ENCOUNTER — Ambulatory Visit: Payer: Medicaid Other | Admitting: Cardiovascular Disease

## 2015-07-25 ENCOUNTER — Other Ambulatory Visit: Payer: Self-pay | Admitting: Family Medicine

## 2015-07-25 ENCOUNTER — Ambulatory Visit (INDEPENDENT_AMBULATORY_CARE_PROVIDER_SITE_OTHER): Payer: Medicaid Other | Admitting: Neurology

## 2015-07-25 ENCOUNTER — Encounter: Payer: Self-pay | Admitting: Neurology

## 2015-07-25 VITALS — BP 140/88 | HR 67 | Resp 18 | Wt 260.0 lb

## 2015-07-25 DIAGNOSIS — F0391 Unspecified dementia with behavioral disturbance: Secondary | ICD-10-CM

## 2015-07-25 DIAGNOSIS — R441 Visual hallucinations: Secondary | ICD-10-CM

## 2015-07-25 DIAGNOSIS — F03B18 Unspecified dementia, moderate, with other behavioral disturbance: Secondary | ICD-10-CM

## 2015-07-25 MED ORDER — DONEPEZIL HCL 10 MG PO TABS: ORAL_TABLET | ORAL | Status: DC

## 2015-07-25 MED ORDER — QUETIAPINE FUMARATE 25 MG PO TABS: ORAL_TABLET | ORAL | Status: DC
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
## 2015-07-25 NOTE — Patient Instructions (Addendum)
1. Schedule open MRI brain without contrast 2. Dementia medication: Start Aricept 10mg : Take 1/2 tablet daily for 1 month, then increase to 1 tablet daily 3. Hallucination medicine: After 2 weeks of starting the Aricept, start Seroquel 25mg : Take 1/2 tablet at night 4. Stop alcohol use 5. Continue 24/7 care, family should be taking care of medications at this point. Call your PCP regarding social worker help to get her into nursing home or having caregiver at home 6. Follow-up in 7 months  7. YOU HAVE BEEN SCHEDULED AT [facility] FOR MRI ON 08/03/15.    PLEASE ARRIVE @ 11AM.   [address]    [place], [place] [address]   [phone]

## 2015-08-01 DIAGNOSIS — F0391 Unspecified dementia with behavioral disturbance: Secondary | ICD-10-CM | POA: Insufficient documentation

## 2015-08-01 DIAGNOSIS — R441 Visual hallucinations: Secondary | ICD-10-CM | POA: Insufficient documentation

## 2015-08-01 DIAGNOSIS — F03B18 Unspecified dementia, moderate, with other behavioral disturbance: Secondary | ICD-10-CM | POA: Insufficient documentation

## 2015-08-06 NOTE — Progress Notes (Signed)
Patient ID: [name], female   DOB: 01-06-1956, 60 y.o.   MRN: [id number]    HISTORY AND PHYSICAL   DATE: 02/26/15  Location:  [facility]    Place of Service: SNF [phone])   Extended Emergency Contact Information Primary Emergency Contact: [facility] Address: [place],  [address] [address] Phone: [phone] Mobile Phone: [phone] Relation: Daughter Secondary Emergency Contact: Neville Route [place] of [place] Phone: [phone] Mobile Phone: [phone] Relation: Daughter  Advanced Directive information  FULL CODE; MOST FORM ON CHART - NO FT  Chief Complaint  Patient presents with  . New Admit To SNF    HPI:  60 yo female seen today as a new admission into SNF following hospital stay for  HTN, chronic respiratory failure on home O2, AKI, CAD, leg pain/LE weakness with hx OA, COPD. LE doppler US neg DVT. CTangio of chest neg PE.   She c/o SOB today. No CP. She is morbidly obese. No nursing issues. No falls. Appetite ok. Sleeping well.  COPD with chronic bronchitis/chronic respiratory failure/asthma - stable on albuterol HFA, albuterol nebs  Hx CVA - stable on ASA and lipitor  Chronic pain/hx arthritis - stable on roxicet  HTN/CAD hx MI/hx nonischemic CM - sx's stable on hydralazine, cardura, imdur, norvasc and toprol xl. She takes ASA daily  Hyperlipidemia - stable on lipitor  GERD - stable on pepcid  Past Medical History  Diagnosis Date  . Arthritis   . Stroke ([place])   . Coronary artery disease     a. remote hx of cath ~2000. B. cath  moderate disease in an RPL branch - too distal for PCI; 3rd RPLB lesion, 50% stenosed; hyperdynamic LV with near cavity obliteration & severely elevated LVEDP  . Asthma   . 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 Non-ischemic cardiomyopathy ([place])     cxr - mild cardiomegly, Echo 10/07/14  - severe LVH with LVEF 65-70%.  . Hypertension   . Heart attack ([place])   . Chronic bronchitis ([place])   . Heart murmur   . Wheelchair bound   . Bilateral chronic knee pain   . Accelerated hypertension     Past Surgical History  Procedure Laterality Date  . Abdominal hysterectomy    . I&d extremity  08/18/2011    Procedure: IRRIGATION AND DEBRIDEMENT EXTREMITY;  Surgeon: Linna Hoff, MD;  Location: [place];  Service: Orthopedics;  Laterality: Right;  I&D Right Long Finger  . Amputation  08/21/2011    Procedure: AMPUTATION DIGIT;  Surgeon: Linna Hoff, MD;  Location: [place];  Service: Orthopedics;  Laterality: Right;  . Flexible sigmoidoscopy  04/28/2012    Procedure: FLEXIBLE SIGMOIDOSCOPY;  Surgeon: Jeryl Columbia, MD;  Location: [facility];  Service: Endoscopy;  Laterality: N/A;  unsedated, unprepped  . Cardiac catheterization N/A 10/09/2014    Procedure: Left Heart Cath and Coronary Angiography;  Surgeon: Leonie Man, MD;  Location: [facility] INVASIVE CV LAB CUPID;  Service: Cardiovascular;  Laterality: N/A;  . Coronary stent placement    . Cardiac catheterization  10/2014    normal coronary arteries aside for a distal RPLB branch with 50% stenosis    Patient Care Team: Benito Mccreedy, MD as PCP - General (Internal Medicine)  Social History   Social History  . Marital Status: Legally Separated  Spouse Name: N/A  . Number of Children: N/A  . Years of Education: N/A   Occupational History  . Not on file.   Social History Main Topics  . Smoking status: Current Every Day Smoker -- 0.50 packs/day for 20 years    Types: Cigarettes  . Smokeless tobacco: Former Systems developer    Types: Snuff  . Alcohol Use: 3.6 oz/week    6 Cans of beer per week     Comment: 04/27/12- "amount varies"  . Drug Use: No  . Sexual Activity: Not on file   Other Topics Concern  . Not on file   Social History Narrative   ** Merged History Encounter **         reports that she has been smoking  Cigarettes.  She has a 10 pack-year smoking history. She has quit using smokeless tobacco. Her smokeless tobacco use included Snuff. She reports that she drinks about 3.6 oz of alcohol per week. She reports that she does not use illicit drugs.  Family History  Problem Relation Age of Onset  . Diabetes type I Mother   . Hypertension Mother   . Arthritis Mother   . Stroke Sister   . Diabetes type I Brother   . Hypertension Brother   . Healthy Brother   . Diabetes type II Brother   . Heart disease Brother   . Healthy Brother   . Healthy Brother   . Diabetes type I Sister   . Hypertension Sister   . Asthma Sister   . Bronchitis Sister    Family Status  Relation Status Death Age  . Mother Deceased   . Father Deceased     UNKNOWN  . Sister Alive   . Brother Deceased   . Brother Deceased     HEALTH Hx UNKNOWN  . Brother Alive   . Brother Alive   . Brother Alive   . Brother Alive   . Sister Deceased     Immunization History  Administered Date(s) Administered  . Tdap 12/05/2014    Allergies  Allergen Reactions  . Penicillins Itching and Nausea And Vomiting    Has patient had a PCN reaction causing immediate rash, facial/tongue/throat swelling, SOB or lightheadedness with hypotension: No Has patient had a PCN reaction causing severe rash involving mucus membranes or skin necrosis: No Has patient had a PCN reaction that required hospitalization: No Has patient had a PCN reaction occurring within the last 10 years: No      Medications: Patient's Medications  New Prescriptions   DONEPEZIL (ARICEPT) 10 MG TABLET    Take 1/2 tablet daily for 1 month, then increase to 1 tablet daily   QUETIAPINE (SEROQUEL) 25 MG TABLET    Take 1/2 tablet at night  Previous Medications   ALBUTEROL (PROVENTIL HFA;VENTOLIN HFA) 108 (90 BASE) MCG/ACT INHALER    1 to 2 puffs every 4 to 6 hours as needed   ATORVASTATIN (LIPITOR) 20 MG TABLET    Take 1 tablet (20 mg total) by mouth daily at 6 PM.     LOSARTAN (COZAAR) 50 MG TABLET    Take 50 mg by mouth daily.    METOPROLOL SUCCINATE (TOPROL-XL) 50 MG 24 HR TABLET    Take 50 mg by mouth daily.  Modified Medications   No medications on file  Discontinued Medications   ALBUTEROL (PROVENTIL) (2.5 MG/3ML) 0.083% NEBULIZER SOLUTION    Take 3 mLs (2.5 mg total) by nebulization every 4 (four) hours as needed for wheezing  or shortness of breath.   AMLODIPINE (NORVASC) 5 MG TABLET    Take 2 tablets (10 mg total) by mouth daily.   ASPIRIN EC 81 MG EC TABLET    Take 1 tablet (81 mg total) by mouth daily.   DOXAZOSIN (CARDURA) 4 MG TABLET    Take 1 tablet (4 mg total) by mouth at bedtime.   FAMOTIDINE (PEPCID) 20 MG TABLET    One at bedtime   HYDRALAZINE (APRESOLINE) 50 MG TABLET    Take 1 tablet (50 mg total) by mouth 3 (three) times daily.   ISOSORBIDE MONONITRATE (IMDUR) 30 MG 24 HR TABLET    Take 1 tablet (30 mg total) by mouth daily.   METOPROLOL SUCCINATE (TOPROL-XL) 25 MG 24 HR TABLET    Take 3 tablets (75 mg total) by mouth daily.   NITROGLYCERIN (NITROSTAT) 0.4 MG SL TABLET    Place 1 tablet (0.4 mg total) under the tongue every 5 (five) minutes as needed for chest pain (Do not give more than 3 SL tablets in 15 minutes.).   OXYCODONE-ACETAMINOPHEN (PERCOCET/ROXICET) 5-325 MG PER TABLET    Take 2 tablets by mouth every 6 (six) hours as needed for moderate pain.    Review of Systems  Respiratory: Positive for shortness of breath and wheezing.   Cardiovascular: Negative for chest pain.  All other systems reviewed and are negative.   Filed Vitals:   02/26/15 1019  BP: 110/62  Pulse: 60  Temp: 97.5 F (36.4 C)  SpO2: 97%   There is no weight on file to calculate BMI.  Physical Exam  Constitutional: She is oriented to person, place, and time. She appears well-developed and well-nourished.  Sitting in bed with conversational dyspnea. Morbidly obese  HENT:  Mouth/Throat: Oropharynx is clear and moist. No oropharyngeal exudate.   Eyes: Pupils are equal, round, and reactive to light. No scleral icterus.  Neck: Neck supple. Carotid bruit is not present. No tracheal deviation present. No thyromegaly present.  Cardiovascular: Normal rate, regular rhythm, normal heart sounds and intact distal pulses.  Frequent extrasystoles are present. Exam reveals no gallop and no friction rub.   No murmur heard. Trace LE edema b/l. No calf TTP  Pulmonary/Chest: Effort normal. No stridor. No respiratory distress. She has no wheezes. She has no rales.  Reduced BS at base but CTA b/l  Abdominal: Soft. Bowel sounds are normal. She exhibits no distension and no mass. There is no hepatomegaly. There is no tenderness. There is no rebound and no guarding.  obese  Musculoskeletal: She exhibits edema.  Lymphadenopathy:    She has no cervical adenopathy.  Neurological: She is alert and oriented to person, place, and time.  Skin: Skin is warm and dry. No rash noted.  Psychiatric: She has a normal mood and affect. Her behavior is normal. Thought content normal.     Labs reviewed: Admission on 02/17/2015, Discharged on 02/22/2015  Component Date Value Ref Range Status  . D-Dimer, Quant 02/17/2015 1.49* 0.00 - 0.48 ug/mL-FEU Final   Comment:        AT THE INHOUSE ESTABLISHED CUTOFF VALUE OF 0.48 ug/mL FEU, THIS ASSAY HAS BEEN DOCUMENTED IN THE LITERATURE TO HAVE A SENSITIVITY AND NEGATIVE PREDICTIVE VALUE OF AT LEAST 98 TO 99%.  THE TEST RESULT SHOULD BE CORRELATED WITH AN ASSESSMENT OF THE CLINICAL PROBABILITY OF DVT / VTE.   . WBC 02/17/2015 9.7  4.0 - 10.5 K/uL Final  . RBC 02/17/2015 4.42  3.87 - 5.11 MIL/uL Final  .  Hemoglobin 02/17/2015 11.3* 12.0 - 15.0 g/dL Final  . HCT 45/36/7611 36.2  36.0 - 46.0 % Final  . MCV 02/17/2015 81.9  78.0 - 100.0 fL Final  . MCH 02/17/2015 25.6* 26.0 - 34.0 pg Final  . MCHC 02/17/2015 31.2  30.0 - 36.0 g/dL Final  . RDW 41/05/5931 16.7* 11.5 - 15.5 % Final  . Platelets 02/17/2015 178  150 - 400  K/uL Final  . Neutrophils Relative % 02/17/2015 73  43 - 77 % Final  . Neutro Abs 02/17/2015 7.0  1.7 - 7.7 K/uL Final  . Lymphocytes Relative 02/17/2015 21  12 - 46 % Final  . Lymphs Abs 02/17/2015 2.1  0.7 - 4.0 K/uL Final  . Monocytes Relative 02/17/2015 6  3 - 12 % Final  . Monocytes Absolute 02/17/2015 0.5  0.1 - 1.0 K/uL Final  . Eosinophils Relative 02/17/2015 1  0 - 5 % Final  . Eosinophils Absolute 02/17/2015 0.1  0.0 - 0.7 K/uL Final  . Basophils Relative 02/17/2015 0  0 - 1 % Final  . Basophils Absolute 02/17/2015 0.0  0.0 - 0.1 K/uL Final  . Sodium 02/17/2015 142  135 - 145 mmol/L Final  . Potassium 02/17/2015 3.6  3.5 - 5.1 mmol/L Final  . Chloride 02/17/2015 108  101 - 111 mmol/L Final  . CO2 02/17/2015 25  22 - 32 mmol/L Final  . Glucose, Bld 02/17/2015 126* 65 - 99 mg/dL Final  . BUN 72/88/8990 30* 6 - 20 mg/dL Final  . Creatinine, Ser 02/17/2015 1.73* 0.44 - 1.00 mg/dL Final  . Calcium 85/91/1770 8.9  8.9 - 10.3 mg/dL Final  . GFR calc non Af Amer 02/17/2015 31* >60 mL/min Final  . GFR calc Af Amer 02/17/2015 36* >60 mL/min Final   Comment: (NOTE) The eGFR has been calculated using the CKD EPI equation. This calculation has not been validated in all clinical situations. eGFR's persistently <60 mL/min signify possible Chronic Kidney Disease.   . Anion gap 02/17/2015 9  5 - 15 Final  . Troponin I 02/17/2015 0.23* <0.031 ng/mL Final   Comment:        PERSISTENTLY INCREASED TROPONIN VALUES IN THE RANGE OF 0.04-0.49 ng/mL CAN BE SEEN IN:       -UNSTABLE ANGINA       -CONGESTIVE HEART FAILURE       -MYOCARDITIS       -CHEST TRAUMA       -ARRYHTHMIAS       -LATE PRESENTING MYOCARDIAL INFARCTION       -COPD   CLINICAL FOLLOW-UP RECOMMENDED.   . B Natriuretic Peptide 02/17/2015 190.3* 0.0 - 100.0 pg/mL Final  . Troponin I 02/18/2015 0.23* <0.031 ng/mL Final   Comment:        PERSISTENTLY INCREASED TROPONIN VALUES IN THE RANGE OF 0.04-0.49 ng/mL CAN BE SEEN IN:        -UNSTABLE ANGINA       -CONGESTIVE HEART FAILURE       -MYOCARDITIS       -CHEST TRAUMA       -ARRYHTHMIAS       -LATE PRESENTING MYOCARDIAL INFARCTION       -COPD   CLINICAL FOLLOW-UP RECOMMENDED.   Marland Kitchen Troponin I 02/18/2015 0.22* <0.031 ng/mL Final   Comment:        PERSISTENTLY INCREASED TROPONIN VALUES IN THE RANGE OF 0.04-0.49 ng/mL CAN BE SEEN IN:       -UNSTABLE ANGINA       -CONGESTIVE  HEART FAILURE       -MYOCARDITIS       -CHEST TRAUMA       -ARRYHTHMIAS       -LATE PRESENTING MYOCARDIAL INFARCTION       -COPD   CLINICAL FOLLOW-UP RECOMMENDED.   Marland Kitchen Troponin I 02/18/2015 0.23* <0.031 ng/mL Final   Comment:        PERSISTENTLY INCREASED TROPONIN VALUES IN THE RANGE OF 0.04-0.49 ng/mL CAN BE SEEN IN:       -UNSTABLE ANGINA       -CONGESTIVE HEART FAILURE       -MYOCARDITIS       -CHEST TRAUMA       -ARRYHTHMIAS       -LATE PRESENTING MYOCARDIAL INFARCTION       -COPD   CLINICAL FOLLOW-UP RECOMMENDED.   Marland Kitchen Sodium 02/18/2015 143  135 - 145 mmol/L Final  . Potassium 02/18/2015 3.5  3.5 - 5.1 mmol/L Final  . Chloride 02/18/2015 111  101 - 111 mmol/L Final  . CO2 02/18/2015 24  22 - 32 mmol/L Final  . Glucose, Bld 02/18/2015 101* 65 - 99 mg/dL Final  . BUN 02/18/2015 25* 6 - 20 mg/dL Final  . Creatinine, Ser 02/18/2015 1.10* 0.44 - 1.00 mg/dL Final  . Calcium 02/18/2015 8.4* 8.9 - 10.3 mg/dL Final  . Total Protein 02/18/2015 6.6  6.5 - 8.1 g/dL Final  . Albumin 02/18/2015 3.5  3.5 - 5.0 g/dL Final  . AST 02/18/2015 20  15 - 41 U/L Final  . ALT 02/18/2015 15  14 - 54 U/L Final  . Alkaline Phosphatase 02/18/2015 61  38 - 126 U/L Final  . Total Bilirubin 02/18/2015 0.3  0.3 - 1.2 mg/dL Final  . GFR calc non Af Amer 02/18/2015 53* >60 mL/min Final  . GFR calc Af Amer 02/18/2015 >60  >60 mL/min Final   Comment: (NOTE) The eGFR has been calculated using the CKD EPI equation. This calculation has not been validated in all clinical situations. eGFR's persistently  <60 mL/min signify possible Chronic Kidney Disease.   . Anion gap 02/18/2015 8  5 - 15 Final  . WBC 02/18/2015 9.3  4.0 - 10.5 K/uL Final  . RBC 02/18/2015 4.21  3.87 - 5.11 MIL/uL Final  . Hemoglobin 02/18/2015 11.1* 12.0 - 15.0 g/dL Final  . HCT 02/18/2015 34.8* 36.0 - 46.0 % Final  . MCV 02/18/2015 82.7  78.0 - 100.0 fL Final  . MCH 02/18/2015 26.4  26.0 - 34.0 pg Final  . MCHC 02/18/2015 31.9  30.0 - 36.0 g/dL Final  . RDW 02/18/2015 16.8* 11.5 - 15.5 % Final  . Platelets 02/18/2015 163  150 - 400 K/uL Final  . aPTT 02/18/2015 25  24 - 37 seconds Final  . Prothrombin Time 02/18/2015 13.2  11.6 - 15.2 seconds Final  . INR 02/18/2015 0.98  0.00 - 1.49 Final  . Creatinine, Urine 02/18/2015 92.06   Final   Performed at [facility]  . Urea Nitrogen, Ur 02/18/2015 675  Not Estab. mg/dL Final   Comment: (NOTE) Performed At: [facility] [place], [place] [id number] Lindon Romp MD WU:[id number]   . Glucose-Capillary 02/18/2015 92  65 - 99 mg/dL Final  . Troponin I 02/19/2015 0.24* <0.031 ng/mL Final   Comment:        PERSISTENTLY INCREASED TROPONIN VALUES IN THE RANGE OF 0.04-0.49 ng/mL CAN BE SEEN IN:       -UNSTABLE ANGINA       -  CONGESTIVE HEART FAILURE       -MYOCARDITIS       -CHEST TRAUMA       -ARRYHTHMIAS       -LATE PRESENTING MYOCARDIAL INFARCTION       -COPD   CLINICAL FOLLOW-UP RECOMMENDED.   Marland Kitchen Troponin I 02/19/2015 0.22* <0.031 ng/mL Final   Comment:        PERSISTENTLY INCREASED TROPONIN VALUES IN THE RANGE OF 0.04-0.49 ng/mL CAN BE SEEN IN:       -UNSTABLE ANGINA       -CONGESTIVE HEART FAILURE       -MYOCARDITIS       -CHEST TRAUMA       -ARRYHTHMIAS       -LATE PRESENTING MYOCARDIAL INFARCTION       -COPD   CLINICAL FOLLOW-UP RECOMMENDED.   Marland Kitchen Troponin I 02/20/2015 0.21* <0.031 ng/mL Final   Comment:        PERSISTENTLY INCREASED TROPONIN VALUES IN THE RANGE OF 0.04-0.49 ng/mL CAN BE SEEN IN:       -UNSTABLE  ANGINA       -CONGESTIVE HEART FAILURE       -MYOCARDITIS       -CHEST TRAUMA       -ARRYHTHMIAS       -LATE PRESENTING MYOCARDIAL INFARCTION       -COPD   CLINICAL FOLLOW-UP RECOMMENDED.   Marland Kitchen Sodium 02/19/2015 142  135 - 145 mmol/L Final  . Potassium 02/19/2015 3.5  3.5 - 5.1 mmol/L Final  . Chloride 02/19/2015 109  101 - 111 mmol/L Final  . CO2 02/19/2015 26  22 - 32 mmol/L Final  . Glucose, Bld 02/19/2015 109* 65 - 99 mg/dL Final  . BUN 02/19/2015 19  6 - 20 mg/dL Final  . Creatinine, Ser 02/19/2015 0.99  0.44 - 1.00 mg/dL Final  . Calcium 02/19/2015 9.1  8.9 - 10.3 mg/dL Final  . GFR calc non Af Amer 02/19/2015 >60  >60 mL/min Final  . GFR calc Af Amer 02/19/2015 >60  >60 mL/min Final   Comment: (NOTE) The eGFR has been calculated using the CKD EPI equation. This calculation has not been validated in all clinical situations. eGFR's persistently <60 mL/min signify possible Chronic Kidney Disease.   . Anion gap 02/19/2015 7  5 - 15 Final  . Sodium 02/20/2015 138  135 - 145 mmol/L Final  . Potassium 02/20/2015 4.0  3.5 - 5.1 mmol/L Final  . Chloride 02/20/2015 108  101 - 111 mmol/L Final  . CO2 02/20/2015 22  22 - 32 mmol/L Final  . Glucose, Bld 02/20/2015 100* 65 - 99 mg/dL Final  . BUN 02/20/2015 21* 6 - 20 mg/dL Final  . Creatinine, Ser 02/20/2015 0.84  0.44 - 1.00 mg/dL Final  . Calcium 02/20/2015 8.5* 8.9 - 10.3 mg/dL Final  . GFR calc non Af Amer 02/20/2015 >60  >60 mL/min Final  . GFR calc Af Amer 02/20/2015 >60  >60 mL/min Final   Comment: (NOTE) The eGFR has been calculated using the CKD EPI equation. This calculation has not been validated in all clinical situations. eGFR's persistently <60 mL/min signify possible Chronic Kidney Disease.   . Anion gap 02/20/2015 8  5 - 15 Final  . WBC 02/20/2015 8.4  4.0 - 10.5 K/uL Final  . RBC 02/20/2015 4.11  3.87 - 5.11 MIL/uL Final  . Hemoglobin 02/20/2015 10.5* 12.0 - 15.0 g/dL Final  . HCT 02/20/2015 33.9* 36.0 - 46.0 %  Final  .  MCV 02/20/2015 82.5  78.0 - 100.0 fL Final  . MCH 02/20/2015 25.5* 26.0 - 34.0 pg Final  . MCHC 02/20/2015 31.0  30.0 - 36.0 g/dL Final  . RDW 02/20/2015 17.2* 11.5 - 15.5 % Final  . Platelets 02/20/2015 149* 150 - 400 K/uL Final   Comment: REPEATED TO VERIFY SPECIMEN CHECKED FOR CLOTS PLATELET COUNT CONFIRMED BY SMEAR   . WBC 02/21/2015 7.7  4.0 - 10.5 K/uL Final  . RBC 02/21/2015 4.19  3.87 - 5.11 MIL/uL Final  . Hemoglobin 02/21/2015 10.8* 12.0 - 15.0 g/dL Final  . HCT 02/21/2015 34.8* 36.0 - 46.0 % Final  . MCV 02/21/2015 83.1  78.0 - 100.0 fL Final  . MCH 02/21/2015 25.8* 26.0 - 34.0 pg Final  . MCHC 02/21/2015 31.0  30.0 - 36.0 g/dL Final  . RDW 02/21/2015 17.2* 11.5 - 15.5 % Final  . Platelets 02/21/2015 149* 150 - 400 K/uL Final  . WBC 02/22/2015 7.3  4.0 - 10.5 K/uL Final  . RBC 02/22/2015 4.19  3.87 - 5.11 MIL/uL Final  . Hemoglobin 02/22/2015 10.6* 12.0 - 15.0 g/dL Final  . HCT 02/22/2015 34.3* 36.0 - 46.0 % Final  . MCV 02/22/2015 81.9  78.0 - 100.0 fL Final  . MCH 02/22/2015 25.3* 26.0 - 34.0 pg Final  . MCHC 02/22/2015 30.9  30.0 - 36.0 g/dL Final  . RDW 02/22/2015 17.0* 11.5 - 15.5 % Final  . Platelets 02/22/2015 155  150 - 400 K/uL Final   REPEATED TO VERIFY  . Sodium 02/22/2015 141  135 - 145 mmol/L Final  . Potassium 02/22/2015 3.9  3.5 - 5.1 mmol/L Final  . Chloride 02/22/2015 110  101 - 111 mmol/L Final  . CO2 02/22/2015 27  22 - 32 mmol/L Final  . Glucose, Bld 02/22/2015 95  65 - 99 mg/dL Final  . BUN 02/22/2015 16  6 - 20 mg/dL Final  . Creatinine, Ser 02/22/2015 0.75  0.44 - 1.00 mg/dL Final  . Calcium 02/22/2015 8.7* 8.9 - 10.3 mg/dL Final  . GFR calc non Af Amer 02/22/2015 >60  >60 mL/min Final  . GFR calc Af Amer 02/22/2015 >60  >60 mL/min Final   Comment: (NOTE) The eGFR has been calculated using the CKD EPI equation. This calculation has not been validated in all clinical situations. eGFR's persistently <60 mL/min signify possible Chronic  Kidney Disease.   . Anion gap 02/22/2015 4* 5 - 15 Final  Admission on 02/15/2015, Discharged on 02/17/2015  Component Date Value Ref Range Status  . Color, Urine 02/15/2015 YELLOW  YELLOW Final  . APPearance 02/15/2015 CLEAR  CLEAR Final  . Specific Gravity, Urine 02/15/2015 1.021  1.005 - 1.030 Final  . pH 02/15/2015 5.5  5.0 - 8.0 Final  . Glucose, UA 02/15/2015 NEGATIVE  NEGATIVE mg/dL Final  . Hgb urine dipstick 02/15/2015 SMALL* NEGATIVE Final  . Bilirubin Urine 02/15/2015 NEGATIVE  NEGATIVE Final  . Ketones, ur 02/15/2015 NEGATIVE  NEGATIVE mg/dL Final  . Protein, ur 02/15/2015 100* NEGATIVE mg/dL Final  . Urobilinogen, UA 02/15/2015 0.2  0.0 - 1.0 mg/dL Final  . Nitrite 02/15/2015 NEGATIVE  NEGATIVE Final  . Leukocytes, UA 02/15/2015 NEGATIVE  NEGATIVE Final  . Specimen Description 02/15/2015 URINE, CLEAN CATCH   Final  . Special Requests 02/15/2015 NONE   Final  . Culture 02/15/2015    Final                   Value:MULTIPLE SPECIES PRESENT, SUGGEST RECOLLECTION Performed  at [facility]   . Report Status 02/15/2015 02/17/2015 FINAL   Final  . Sodium 02/15/2015 144  135 - 145 mmol/L Final  . Potassium 02/15/2015 3.5  3.5 - 5.1 mmol/L Final  . Chloride 02/15/2015 104  101 - 111 mmol/L Final  . CO2 02/15/2015 29  22 - 32 mmol/L Final  . Glucose, Bld 02/15/2015 83  65 - 99 mg/dL Final  . BUN 02/15/2015 13  6 - 20 mg/dL Final  . Creatinine, Ser 02/15/2015 0.71  0.44 - 1.00 mg/dL Final  . Calcium 02/15/2015 9.8  8.9 - 10.3 mg/dL Final  . Total Protein 02/15/2015 9.1* 6.5 - 8.1 g/dL Final  . Albumin 02/15/2015 4.3  3.5 - 5.0 g/dL Final  . AST 02/15/2015 29  15 - 41 U/L Final  . ALT 02/15/2015 16  14 - 54 U/L Final  . Alkaline Phosphatase 02/15/2015 77  38 - 126 U/L Final  . Total Bilirubin 02/15/2015 0.6  0.3 - 1.2 mg/dL Final  . GFR calc non Af Amer 02/15/2015 >60  >60 mL/min Final  . GFR calc Af Amer 02/15/2015 >60  >60 mL/min Final   Comment: (NOTE) The eGFR has  been calculated using the CKD EPI equation. This calculation has not been validated in all clinical situations. eGFR's persistently <60 mL/min signify possible Chronic Kidney Disease.   . Anion gap 02/15/2015 11  5 - 15 Final  . WBC 02/15/2015 11.2* 4.0 - 10.5 K/uL Final  . RBC 02/15/2015 5.87* 3.87 - 5.11 MIL/uL Final  . Hemoglobin 02/15/2015 15.6* 12.0 - 15.0 g/dL Final  . HCT 02/15/2015 47.8* 36.0 - 46.0 % Final  . MCV 02/15/2015 81.4  78.0 - 100.0 fL Final  . MCH 02/15/2015 26.6  26.0 - 34.0 pg Final  . MCHC 02/15/2015 32.6  30.0 - 36.0 g/dL Final  . RDW 02/15/2015 16.5* 11.5 - 15.5 % Final  . Platelets 02/15/2015 187  150 - 400 K/uL Final   Comment: SPECIMEN CHECKED FOR CLOTS REPEATED TO VERIFY   . Neutrophils Relative % 02/15/2015 69  43 - 77 % Final  . Neutro Abs 02/15/2015 7.7  1.7 - 7.7 K/uL Final  . Lymphocytes Relative 02/15/2015 24  12 - 46 % Final  . Lymphs Abs 02/15/2015 2.7  0.7 - 4.0 K/uL Final  . Monocytes Relative 02/15/2015 6  3 - 12 % Final  . Monocytes Absolute 02/15/2015 0.6  0.1 - 1.0 K/uL Final  . Eosinophils Relative 02/15/2015 1  0 - 5 % Final  . Eosinophils Absolute 02/15/2015 0.1  0.0 - 0.7 K/uL Final  . Basophils Relative 02/15/2015 0  0 - 1 % Final  . Basophils Absolute 02/15/2015 0.0  0.0 - 0.1 K/uL Final  . Troponin I 02/15/2015 0.28* <0.031 ng/mL Final   Comment:        PERSISTENTLY INCREASED TROPONIN VALUES IN THE RANGE OF 0.04-0.49 ng/mL CAN BE SEEN IN:       -UNSTABLE ANGINA       -CONGESTIVE HEART FAILURE       -MYOCARDITIS       -CHEST TRAUMA       -ARRYHTHMIAS       -LATE PRESENTING MYOCARDIAL INFARCTION       -COPD   CLINICAL FOLLOW-UP RECOMMENDED.   Marland Kitchen Total CK 02/15/2015 347* 38 - 234 U/L Final  . Lactic Acid, Venous 02/15/2015 2.05* 0.5 - 2.0 mmol/L Final  . Comment 02/15/2015 NOTIFIED PHYSICIAN   Final  . Squamous  Epithelial / LPF 02/15/2015 FEW* RARE Final  . WBC, UA 02/15/2015 3-6  <3 WBC/hpf Final  . RBC / HPF 02/15/2015 3-6   <3 RBC/hpf Final  . Bacteria, UA 02/15/2015 FEW* RARE Final  . Casts 02/15/2015 HYALINE CASTS* NEGATIVE Final  . Urine-Other 02/15/2015 MUCOUS PRESENT   Final  . Troponin I 02/15/2015 0.25* <0.031 ng/mL Final   Comment:        PERSISTENTLY INCREASED TROPONIN VALUES IN THE RANGE OF 0.04-0.49 ng/mL CAN BE SEEN IN:       -UNSTABLE ANGINA       -CONGESTIVE HEART FAILURE       -MYOCARDITIS       -CHEST TRAUMA       -ARRYHTHMIAS       -LATE PRESENTING MYOCARDIAL INFARCTION       -COPD   CLINICAL FOLLOW-UP RECOMMENDED.   Marland Kitchen Troponin I 02/16/2015 0.25* <0.031 ng/mL Final   Comment:        PERSISTENTLY INCREASED TROPONIN VALUES IN THE RANGE OF 0.04-0.49 ng/mL CAN BE SEEN IN:       -UNSTABLE ANGINA       -CONGESTIVE HEART FAILURE       -MYOCARDITIS       -CHEST TRAUMA       -ARRYHTHMIAS       -LATE PRESENTING MYOCARDIAL INFARCTION       -COPD   CLINICAL FOLLOW-UP RECOMMENDED.   Marland Kitchen Troponin I 02/16/2015 0.26* <0.031 ng/mL Final   Comment:        PERSISTENTLY INCREASED TROPONIN VALUES IN THE RANGE OF 0.04-0.49 ng/mL CAN BE SEEN IN:       -UNSTABLE ANGINA       -CONGESTIVE HEART FAILURE       -MYOCARDITIS       -CHEST TRAUMA       -ARRYHTHMIAS       -LATE PRESENTING MYOCARDIAL INFARCTION       -COPD   CLINICAL FOLLOW-UP RECOMMENDED.   Marland Kitchen Total CK 02/16/2015 281* 38 - 234 U/L Final  Office Visit on 12/26/2014  Component Date Value Ref Range Status  . Sodium 12/26/2014 143  135 - 145 mEq/L Final  . Potassium 12/26/2014 4.3  3.5 - 5.1 mEq/L Final  . Chloride 12/26/2014 106  96 - 112 mEq/L Final  . CO2 12/26/2014 28  19 - 32 mEq/L Final  . Glucose, Bld 12/26/2014 88  70 - 99 mg/dL Final  . BUN 12/26/2014 12  6 - 23 mg/dL Final  . Creatinine, Ser 12/26/2014 0.78  0.40 - 1.20 mg/dL Final  . Calcium 12/26/2014 9.8  8.4 - 10.5 mg/dL Final  . GFR 12/26/2014 96.79  >60.00 mL/min Final    No results found.   Assessment/Plan   ICD-9-CM ICD-10-CM   1. Chronic respiratory failure  with hypoxia (HCC) 518.83 J96.11    799.02    2. Chronic bronchitis, unspecified chronic bronchitis type ([place]) 491.9 J42   3. Non-ischemic cardiomyopathy (HCC) 425.4 I42.9   4. Essential hypertension 401.9 I10   5. HLD (hyperlipidemia) 272.4 E78.5   6. History of stroke V12.54 Z86.73   7. Primary osteoarthritis involving multiple joints 715.09 M15.0   8. Gastroesophageal reflux disease without esophagitis 530.81 K21.9     Cont [place] O2 prn to keep O2 sats >90%  PT/OT as ordered  F/u with cardiology as scheduled  Check BMP, CBC as ordered  GOAL: short term rehab and d/c home when medically appropriate. Communicated with pt and nursing.  Will  follow  Matti Killingsworth S. Perlie Gold  [facility] [address] [place], [place] [address] [phone] Cell (Monday-Friday 8 AM - 5 PM) [phone] After 5 PM and follow prompts

## 2015-08-09 ENCOUNTER — Ambulatory Visit: Payer: Medicaid Other | Admitting: Cardiovascular Disease

## 2015-08-17 ENCOUNTER — Ambulatory Visit: Payer: Medicaid Other | Admitting: Cardiovascular Disease

## 2015-08-30 ENCOUNTER — Encounter: Payer: Self-pay | Admitting: *Deleted

## 2015-08-30 ENCOUNTER — Ambulatory Visit: Payer: Medicaid Other | Admitting: Cardiovascular Disease

## 2015-10-02 ENCOUNTER — Telehealth: Payer: Self-pay | Admitting: Neurology

## 2015-10-02 NOTE — Telephone Encounter (Signed)
MRI brought to me today for review.  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  Nothing acute on this examination.  Find out from family if her sx's are better on meds Dr. Karel Jarvis prescribing

## 2015-10-03 NOTE — Telephone Encounter (Signed)
Spoke with patients daughter/Amy notified her of result. Patient couldn't tolerate Aricept it gave her diarrhea, she is no longer taking. Wants to know if different medication can be prescribed.

## 2015-10-03 NOTE — Telephone Encounter (Signed)
Spoke with patients other daughter Rosetta Posner to discuss f/u appt. They are agreeable patient is scheduled for 5/15 for f/u with Dr. Arbutus Leas. They need at least 2 weeks notice for appt to set up transportation through [facility].

## 2015-10-03 NOTE — Telephone Encounter (Signed)
Can you give her a f/u with me and we can maybe discuss a different medication, especially since Dr Karel Jarvis also started seroquel that day.  Thx

## 2015-10-23 ENCOUNTER — Ambulatory Visit: Payer: Medicaid Other | Admitting: Neurology

## 2015-11-16 ENCOUNTER — Ambulatory Visit (HOSPITAL_COMMUNITY): Payer: Medicaid Other | Admitting: Psychiatry

## 2015-11-19 ENCOUNTER — Emergency Department (HOSPITAL_COMMUNITY)
Admission: EM | Admit: 2015-11-19 | Discharge: 2015-11-20 | Disposition: A | Discharge status: Home / Self Care | Payer: Medicaid Other | Attending: Emergency Medicine | Admitting: Emergency Medicine

## 2015-11-19 ENCOUNTER — Emergency Department (HOSPITAL_COMMUNITY): Payer: Medicaid Other

## 2015-11-19 ENCOUNTER — Encounter (HOSPITAL_COMMUNITY): Payer: Self-pay | Admitting: *Deleted

## 2015-11-19 DIAGNOSIS — J45909 Unspecified asthma, uncomplicated: Secondary | ICD-10-CM | POA: Diagnosis not present

## 2015-11-19 DIAGNOSIS — M1990 Unspecified osteoarthritis, unspecified site: Secondary | ICD-10-CM | POA: Insufficient documentation

## 2015-11-19 DIAGNOSIS — M545 Low back pain, unspecified: Secondary | ICD-10-CM

## 2015-11-19 DIAGNOSIS — I2510 Atherosclerotic heart disease of native coronary artery without angina pectoris: Secondary | ICD-10-CM | POA: Insufficient documentation

## 2015-11-19 DIAGNOSIS — F17210 Nicotine dependence, cigarettes, uncomplicated: Secondary | ICD-10-CM | POA: Diagnosis not present

## 2015-11-19 DIAGNOSIS — Z8673 Personal history of transient ischemic attack (TIA), and cerebral infarction without residual deficits: Secondary | ICD-10-CM | POA: Diagnosis not present

## 2015-11-19 DIAGNOSIS — I10 Essential (primary) hypertension: Secondary | ICD-10-CM | POA: Diagnosis not present

## 2015-11-19 DIAGNOSIS — Z7951 Long term (current) use of inhaled steroids: Secondary | ICD-10-CM | POA: Diagnosis not present

## 2015-11-19 DIAGNOSIS — K5730 Diverticulosis of large intestine without perforation or abscess without bleeding: Secondary | ICD-10-CM | POA: Diagnosis not present

## 2015-11-19 DIAGNOSIS — Z79899 Other long term (current) drug therapy: Secondary | ICD-10-CM | POA: Insufficient documentation

## 2015-11-19 DIAGNOSIS — R1011 Right upper quadrant pain: Secondary | ICD-10-CM | POA: Diagnosis present

## 2015-11-19 LAB — CBC
HEMATOCRIT: 39.5 % (ref 36.0–46.0)
HEMOGLOBIN: 12.8 g/dL (ref 12.0–15.0)
MCH: 25.2 pg — ABNORMAL LOW (ref 26.0–34.0)
MCHC: 32.4 g/dL (ref 30.0–36.0)
MCV: 77.8 fL — ABNORMAL LOW (ref 78.0–100.0)
Platelets: 143 10*3/uL — ABNORMAL LOW (ref 150–400)
RBC: 5.08 MIL/uL (ref 3.87–5.11)
RDW: 16.3 % — AB (ref 11.5–15.5)
WBC: 8 10*3/uL (ref 4.0–10.5)

## 2015-11-19 LAB — URINE MICROSCOPIC-ADD ON
RBC / HPF: NONE SEEN RBC/hpf (ref 0–5)
WBC, UA: NONE SEEN WBC/hpf (ref 0–5)

## 2015-11-19 LAB — COMPREHENSIVE METABOLIC PANEL
ALT: 14 U/L (ref 14–54)
ANION GAP: 4 — AB (ref 5–15)
AST: 18 U/L (ref 15–41)
Albumin: 3.7 g/dL (ref 3.5–5.0)
Alkaline Phosphatase: 65 U/L (ref 38–126)
BILIRUBIN TOTAL: 0.4 mg/dL (ref 0.3–1.2)
BUN: 24 mg/dL — AB (ref 6–20)
CO2: 26 mmol/L (ref 22–32)
Calcium: 9 mg/dL (ref 8.9–10.3)
Chloride: 109 mmol/L (ref 101–111)
Creatinine, Ser: 0.91 mg/dL (ref 0.44–1.00)
GFR calc Af Amer: >60 mL/min (ref >60)
Glucose, Bld: 97 mg/dL (ref 65–99)
POTASSIUM: 4.1 mmol/L (ref 3.5–5.1)
Sodium: 139 mmol/L (ref 135–145)
TOTAL PROTEIN: 7.4 g/dL (ref 6.5–8.1)

## 2015-11-19 LAB — URINALYSIS, ROUTINE W REFLEX MICROSCOPIC
BILIRUBIN URINE: NEGATIVE
GLUCOSE, UA: NEGATIVE mg/dL
HGB URINE DIPSTICK: NEGATIVE
Ketones, ur: NEGATIVE mg/dL
Leukocytes, UA: NEGATIVE
Nitrite: NEGATIVE
PROTEIN: 30 mg/dL — AB
SPECIFIC GRAVITY, URINE: 1.03 (ref 1.005–1.030)
pH: 5.5 (ref 5.0–8.0)

## 2015-11-19 LAB — LIPASE, BLOOD: LIPASE: 33 U/L (ref 11–51)

## 2015-11-19 MED ORDER — DIATRIZOATE MEGLUMINE & SODIUM 66-10 % PO SOLN
15.0000 mL | Freq: Once | ORAL | Status: AC
  Administered 2015-11-19: 30 mL via ORAL

## 2015-11-19 MED ORDER — IOPAMIDOL (ISOVUE-300) INJECTION 61%
100.0000 mL | Freq: Once | INTRAVENOUS | Status: AC | PRN
  Administered 2015-11-19: 100 mL via INTRAVENOUS

## 2015-11-19 MED ORDER — IOPAMIDOL (ISOVUE-300) INJECTION 61%: 100.0000 mL | Freq: Once | INTRAVENOUS | Status: DC | PRN

## 2015-11-19 NOTE — ED Notes (Signed)
Will NT attempted to check manual BP, pt started to scream "take it off!"  This nurse attempted to take another BP in her L arm, pt started to take the cuff off while it's still reading.  States "it's hurting my arm."

## 2015-11-19 NOTE — ED Notes (Signed)
Patient reports right flank pain which began today.  Reports dysuria.  Attempted to draw blood from patient.  Patient reports "let me think about it".

## 2015-11-19 NOTE — ED Provider Notes (Signed)
Procedure note: Ultrasound Guided Peripheral IV Ultrasound guided peripheral 1.88 inch angiocath IV placement performed by me. Indications: Nursing unable to place IV. Details: The antecubital fossa and upper arm were evaluated with a multifrequency linear probe. Patent brachial veins were noted. 1 attempt was made to cannulate a vein under realtime US guidance with successful cannulation of the vein and catheter placement. There is return of non-pulsatile dark red blood. The patient tolerated the procedure well without complications. Images archived electronically.  CPT codes: 42353 and 61443   Lyndal Pulley, MD 11/19/15 [phone]

## 2015-11-19 NOTE — ED Notes (Signed)
Pt reports gen abd pain and low back pain since last night.  Pt denies n/v, or urinary sxs at this time.

## 2015-11-19 NOTE — ED Provider Notes (Signed)
CSN: [id number]     Arrival date & time 11/19/15  1448 History   First MD Initiated Contact with Patient 11/19/15 1851     Chief Complaint  Patient presents with  . Abdominal Pain  . Back Pain   HPI   60 year old female presents today with complaints of abdominal pain and back pain. Patient reports symptoms started this morning with pain in her right upper quadrant with radiation around to her back. Patient notes the pain is in the lower aspect of her back, radiates across the entire back, worse with movements, not worse with palpitations. Patient denies any nausea, vomiting , fever or chills. She denies any dysuria, or changes in color clarity or characteristics of her urine; normal bowel movements. Denies a history of the same. She denies any dizziness, headache.   Past Medical History  Diagnosis Date  . Arthritis   . Stroke (HCC)   . Coronary artery disease     a. remote hx of cath ~2000. B. cath  moderate disease in an RPL branch - too distal for PCI; 3rd RPLB lesion, 50% stenosed; hyperdynamic LV with near cavity obliteration & severely elevated LVEDP  . Asthma   . 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 Non-ischemic cardiomyopathy (HCC)     cxr - mild cardiomegly, Echo 10/07/14 - severe LVH with LVEF 65-70%.  . Hypertension   . Heart attack (HCC)   . Chronic bronchitis (HCC)   . Heart murmur   . Wheelchair bound   . Bilateral chronic knee pain   . Accelerated hypertension    Past Surgical History  Procedure Laterality Date  . Abdominal hysterectomy    . I&d extremity  08/18/2011    Procedure: IRRIGATION AND DEBRIDEMENT EXTREMITY;  Surgeon: Sharma Covert, MD;  Location: [facility] OR;  Service: Orthopedics;  Laterality: Right;  I&D Right Long Finger  . Amputation  08/21/2011    Procedure: AMPUTATION DIGIT;  Surgeon: Sharma Covert, MD;  Location: [facility] OR;  Service: Orthopedics;  Laterality: Right;  . Flexible sigmoidoscopy   04/28/2012    Procedure: FLEXIBLE SIGMOIDOSCOPY;  Surgeon: Petra Kuba, MD;  Location: [facility] ENDOSCOPY;  Service: Endoscopy;  Laterality: N/A;  unsedated, unprepped  . Cardiac catheterization N/A 10/09/2014    Procedure: Left Heart Cath and Coronary Angiography;  Surgeon: Marykay Lex, MD;  Location: [facility] INVASIVE CV LAB CUPID;  Service: Cardiovascular;  Laterality: N/A;  . Coronary stent placement    . Cardiac catheterization  10/2014    normal coronary arteries aside for a distal RPLB branch with 50% stenosis   Family History  Problem Relation Age of Onset  . Diabetes type I Mother   . Hypertension Mother   . Arthritis Mother   . Stroke Sister   . Diabetes type I Brother   . Hypertension Brother   . Healthy Brother   . Diabetes type II Brother   . Heart disease Brother   . Healthy Brother   . Healthy Brother   . Diabetes type I Sister   . Hypertension Sister   . Asthma Sister   . Bronchitis Sister    Social History  Substance Use Topics  . Smoking status: Current Every Day Smoker -- 0.50 packs/day for 20 years    Types: Cigarettes  . Smokeless tobacco: Former Neurosurgeon    Types: Snuff  . Alcohol Use: 3.6 oz/week    6 Cans of beer per  week     Comment: 04/27/12- "amount varies"   OB History    Gravida Para Term Preterm AB TAB SAB Ectopic Multiple Living   0 0 0 0 0 0 0 0       Review of Systems  All other systems reviewed and are negative.   Allergies  Penicillins  Home Medications   Prior to Admission medications   Medication Sig Start Date End Date Taking? Authorizing Provider  albuterol (PROVENTIL HFA;VENTOLIN HFA) 108 (90 BASE) MCG/ACT inhaler 1 to 2 puffs every 4 to 6 hours as needed Patient taking differently: Inhale 1-2 puffs into the lungs every 4 (four) hours as needed for wheezing or shortness of breath.  12/26/14  Yes Scott T Alben Spittle, PA-C  atorvastatin (LIPITOR) 20 MG tablet Take 1 tablet (20 mg total) by mouth daily at 6 PM. 02/17/15  Yes Calvert Cantor, MD   cephALEXin (KEFLEX) 500 MG capsule TAKE ONE CAPSULE BY MOUTH 4 TIMES A DAY FOR 10 DAYS 11/07/15  Yes Historical Provider, MD  donepezil (ARICEPT) 10 MG tablet Take 1/2 tablet daily for 1 month, then increase to 1 tablet daily Patient taking differently: Take 5 mg by mouth at bedtime.  07/25/15  Yes Van Clines, MD  HYDROcodone-acetaminophen (NORCO/VICODIN) 5-325 MG tablet Take 1 tablet by mouth every 8 (eight) hours as needed. 11/07/15  Yes Historical Provider, MD  hydrOXYzine (VISTARIL) 25 MG capsule Take 25 mg by mouth at bedtime. 11/07/15  Yes Historical Provider, MD  losartan (COZAAR) 50 MG tablet Take 50 mg by mouth daily.  02/17/15  Yes Historical Provider, MD  metoprolol succinate (TOPROL-XL) 50 MG 24 hr tablet Take 50 mg by mouth daily. 03/20/15  Yes Historical Provider, MD  QUEtiapine (SEROQUEL) 25 MG tablet Take 1/2 tablet at night 07/25/15  Yes Van Clines, MD  baclofen (LIORESAL) 10 MG tablet Take 10 mg by mouth 3 (three) times daily as needed. 11/07/15   Historical Provider, MD  ciprofloxacin (CIPRO) 500 MG tablet Take 1 tablet (500 mg total) by mouth every 12 (twelve) hours. 11/20/15   Eyvonne Mechanic, PA-C  metroNIDAZOLE (FLAGYL) 500 MG tablet Take 1 tablet (500 mg total) by mouth 2 (two) times daily. 11/20/15   Ralphie Lovelady, PA-C   BP 194/76 mmHg  Pulse 62  Temp(Src) 98.6 F (37 C) (Oral)  Resp 29  SpO2 99%   Physical Exam  Constitutional: She is oriented to person, place, and time. She appears well-developed and well-nourished.  Morbidly obese  HENT:  Head: Normocephalic and atraumatic.  Eyes: Conjunctivae are normal. Pupils are equal, round, and reactive to light. Right eye exhibits no discharge. Left eye exhibits no discharge. No scleral icterus.  Neck: Normal range of motion. No JVD present. No tracheal deviation present.  Pulmonary/Chest: Effort normal. No stridor.  Abdominal: Soft. She exhibits no distension. There is no tenderness. There is no rebound and no  guarding.  Abdomen is soft and nontender  Musculoskeletal:  No CT or L-spine tenderness. No tenderness to palpation of the lumbar soft tissue or posterior hip. No rashes noted.  Neurological: She is alert and oriented to person, place, and time. Coordination normal.  Skin: Skin is warm and dry. No rash noted. No erythema. No pallor.  Psychiatric: She has a normal mood and affect. Her behavior is normal. Judgment and thought content normal.  Nursing note and vitals reviewed.   ED Course  Procedures (including critical care time) Labs Review Labs Reviewed  COMPREHENSIVE METABOLIC PANEL - Abnormal;  Notable for the following:    BUN 24 (*)    Anion gap 4 (*)    All other components within normal limits  CBC - Abnormal; Notable for the following:    MCV 77.8 (*)    MCH 25.2 (*)    RDW 16.3 (*)    Platelets 143 (*)    All other components within normal limits  URINALYSIS, ROUTINE W REFLEX MICROSCOPIC (NOT AT [facility]) - Abnormal; Notable for the following:    Protein, ur 30 (*)    All other components within normal limits  URINE MICROSCOPIC-ADD ON - Abnormal; Notable for the following:    Squamous Epithelial / LPF 0-5 (*)    Bacteria, UA RARE (*)    Casts HYALINE CASTS (*)    All other components within normal limits  LIPASE, BLOOD    Imaging Review Ct Abdomen Pelvis W Contrast  11/20/2015  CLINICAL DATA:  Generalized abdominal pain and low back pain since last night. Poor IV access. Contrast material was administered by hand. No contrast material is demonstrated within the vascular structures on the images. Contrast extravasation is not excluded. EXAM: CT ABDOMEN AND PELVIS WITH CONTRAST TECHNIQUE: Multidetector CT imaging of the abdomen and pelvis was performed using the standard protocol following bolus administration of intravenous contrast. CONTRAST:  ISOVUE-300 IOPAMIDOL (ISOVUE-300) INJECTION 61% COMPARISON:  05/24/2015 FINDINGS: Atelectasis in the lung bases. Surgical absence  of the gallbladder. No bile duct dilatation. Unenhanced appearance of the liver, spleen, pancreas, adrenal glands, kidneys, abdominal aorta, inferior vena cava, and retroperitoneal lymph nodes is unremarkable. Stomach, small bowel, and colon are not abnormally distended. Contrast material flows to the colon without evidence of bowel obstruction. No free air or free fluid in the abdomen. Pelvis: Appendix is not identified. Uterus is surgically absent. Gas collection in the left pelvis appears to communicate with the vagina but is somewhat high. This may be postoperative or may indicate a vaginal fistula. Clinical correlation suggested. Diverticulosis of the sigmoid colon. Mild infiltration around the sigmoid colon may represent early diverticulitis. No abscess. No free or loculated pelvic fluid collections. No pelvic mass or lymphadenopathy. Degenerative changes in the lumbar spine. No destructive bone lesions. IMPRESSION: Probable mild changes of diverticulitis. No abscess. Gas collections somewhat high in the left pelvis may be postoperative changes related to vagina but a colovaginal fistula is not excluded and clinical correlation is suggested. Electronically Signed   By: Burman Nieves M.D.   On: 11/20/2015 01:00   I have personally reviewed and evaluated these images and lab results as part of my medical decision-making.   EKG Interpretation None      MDM   Final diagnoses:  Bilateral low back pain without sciatica  Diverticulosis of large intestine without hemorrhage   Labs: Lipase, CMP, CBC, urinalysis  Imaging: CT abdomen pelvis with contrast  Consults:  Therapeutics:  Discharge Meds:   Assessment/Plan: 60 year old female presents today with complaints of abdominal pain and back pain. Originally patient complained of right upper quadrant abdominal pain radiation to the back. She describes the back pain is diffuse along the lower aspect of her back. She denies any neurological  deficits, loss of distal sensation strength or motor function, no red flags for back pain. She was nontender to palpation on the back exam, but had worsening pain with movements. Due to complaints of abdominal pain and back pain CT scan was ordered. Patient's labs returned showing no significant findings, reevaluation patient reports symptoms have improved after lying  in the bed, but reports the returned when she tries to sit back up. Patient also notes that she has had change in her mattress which could likely be the cause of these discomfort.  Nose informed that she had extravasation of her IV. Patient denies any complaints from the IV for extravasation, she has no blistering, decreased sensation, strength and motor function of the arm. No significant swelling noted  CT scan shows likely mild changes of diverticulitis. Patient's pain likely multifactorial. She is tolerating by mouth, afebrile, and in no acute distress. She will be discharged home with oral antibiotics, primary care follow-up in 2 days. Patient was hypertensive here in the ED, she has no signs of end organ damage, reports that she's not taking her blood pressure medication at home as this is difficult for her to follow up with. I counseled both her and her family members on the importance of her taking blood pressure medications. They will be taking patient home with them, and helping to administer blood pressure medication, antibiotics. They're given strict return precautions, they verbalized understanding and agreement today's plan. They were again counseled on IV infiltration signs and symptoms for return.          Eyvonne Mechanic, PA-C 11/20/15 0120  Nelva Nay, MD 11/21/15 [phone]

## 2015-11-19 NOTE — ED Notes (Signed)
Pt is refusing to use the restroom at this time.

## 2015-11-19 NOTE — ED Notes (Signed)
Patient refuses blood draw. RN made aware.

## 2015-11-20 MED ORDER — METRONIDAZOLE 500 MG PO TABS: 500.0000 mg | ORAL_TABLET | Freq: Two times a day (BID) | ORAL | Status: DC

## 2015-11-20 MED ORDER — CIPROFLOXACIN HCL 500 MG PO TABS: 500.0000 mg | ORAL_TABLET | Freq: Two times a day (BID) | ORAL | Status: DC

## 2015-11-20 NOTE — Discharge Instructions (Signed)

## 2015-12-01 ENCOUNTER — Inpatient Hospital Stay (HOSPITAL_COMMUNITY)
Admission: EM | Admit: 2015-12-01 | Discharge: 2015-12-12 | DRG: 394 | Disposition: A | Discharge status: Skilled Nursing Facility | Payer: Medicaid Other | Attending: Family Medicine | Admitting: Family Medicine

## 2015-12-01 ENCOUNTER — Emergency Department (HOSPITAL_COMMUNITY): Payer: Medicaid Other

## 2015-12-01 ENCOUNTER — Encounter (HOSPITAL_COMMUNITY): Payer: Self-pay | Admitting: Emergency Medicine

## 2015-12-01 DIAGNOSIS — R159 Full incontinence of feces: Secondary | ICD-10-CM | POA: Diagnosis present

## 2015-12-01 DIAGNOSIS — N824 Other female intestinal-genital tract fistulae: Secondary | ICD-10-CM | POA: Diagnosis not present

## 2015-12-01 DIAGNOSIS — Z89021 Acquired absence of right finger(s): Secondary | ICD-10-CM | POA: Diagnosis not present

## 2015-12-01 DIAGNOSIS — F0390 Unspecified dementia without behavioral disturbance: Secondary | ICD-10-CM

## 2015-12-01 DIAGNOSIS — I2510 Atherosclerotic heart disease of native coronary artery without angina pectoris: Secondary | ICD-10-CM | POA: Diagnosis present

## 2015-12-01 DIAGNOSIS — N179 Acute kidney failure, unspecified: Secondary | ICD-10-CM | POA: Diagnosis not present

## 2015-12-01 DIAGNOSIS — N823 Fistula of vagina to large intestine: Principal | ICD-10-CM | POA: Diagnosis present

## 2015-12-01 DIAGNOSIS — K5732 Diverticulitis of large intestine without perforation or abscess without bleeding: Secondary | ICD-10-CM | POA: Diagnosis present

## 2015-12-01 DIAGNOSIS — I110 Hypertensive heart disease with heart failure: Secondary | ICD-10-CM | POA: Diagnosis present

## 2015-12-01 DIAGNOSIS — J42 Unspecified chronic bronchitis: Secondary | ICD-10-CM | POA: Diagnosis present

## 2015-12-01 DIAGNOSIS — Z993 Dependence on wheelchair: Secondary | ICD-10-CM | POA: Diagnosis not present

## 2015-12-01 DIAGNOSIS — R109 Unspecified abdominal pain: Secondary | ICD-10-CM

## 2015-12-01 DIAGNOSIS — I10 Essential (primary) hypertension: Secondary | ICD-10-CM | POA: Diagnosis not present

## 2015-12-01 DIAGNOSIS — Z90710 Acquired absence of both cervix and uterus: Secondary | ICD-10-CM | POA: Diagnosis not present

## 2015-12-01 DIAGNOSIS — Z79891 Long term (current) use of opiate analgesic: Secondary | ICD-10-CM | POA: Diagnosis not present

## 2015-12-01 DIAGNOSIS — M1990 Unspecified osteoarthritis, unspecified site: Secondary | ICD-10-CM | POA: Diagnosis present

## 2015-12-01 DIAGNOSIS — F0391 Unspecified dementia with behavioral disturbance: Secondary | ICD-10-CM | POA: Diagnosis not present

## 2015-12-01 DIAGNOSIS — Z720 Tobacco use: Secondary | ICD-10-CM | POA: Diagnosis not present

## 2015-12-01 DIAGNOSIS — I252 Old myocardial infarction: Secondary | ICD-10-CM | POA: Diagnosis not present

## 2015-12-01 DIAGNOSIS — Z833 Family history of diabetes mellitus: Secondary | ICD-10-CM

## 2015-12-01 DIAGNOSIS — Z7951 Long term (current) use of inhaled steroids: Secondary | ICD-10-CM

## 2015-12-01 DIAGNOSIS — I428 Other cardiomyopathies: Secondary | ICD-10-CM | POA: Diagnosis present

## 2015-12-01 DIAGNOSIS — Z955 Presence of coronary angioplasty implant and graft: Secondary | ICD-10-CM

## 2015-12-01 DIAGNOSIS — Z6841 Body Mass Index (BMI) 40.0 and over, adult: Secondary | ICD-10-CM

## 2015-12-01 DIAGNOSIS — K5730 Diverticulosis of large intestine without perforation or abscess without bleeding: Secondary | ICD-10-CM | POA: Diagnosis present

## 2015-12-01 DIAGNOSIS — F17210 Nicotine dependence, cigarettes, uncomplicated: Secondary | ICD-10-CM | POA: Diagnosis present

## 2015-12-01 DIAGNOSIS — I5032 Chronic diastolic (congestive) heart failure: Secondary | ICD-10-CM | POA: Diagnosis not present

## 2015-12-01 DIAGNOSIS — Z79899 Other long term (current) drug therapy: Secondary | ICD-10-CM | POA: Diagnosis not present

## 2015-12-01 DIAGNOSIS — E785 Hyperlipidemia, unspecified: Secondary | ICD-10-CM | POA: Diagnosis present

## 2015-12-01 DIAGNOSIS — G8929 Other chronic pain: Secondary | ICD-10-CM | POA: Diagnosis not present

## 2015-12-01 DIAGNOSIS — I119 Hypertensive heart disease without heart failure: Secondary | ICD-10-CM | POA: Diagnosis present

## 2015-12-01 DIAGNOSIS — J45909 Unspecified asthma, uncomplicated: Secondary | ICD-10-CM | POA: Diagnosis present

## 2015-12-01 DIAGNOSIS — Z8673 Personal history of transient ischemic attack (TIA), and cerebral infarction without residual deficits: Secondary | ICD-10-CM

## 2015-12-01 DIAGNOSIS — I25119 Atherosclerotic heart disease of native coronary artery with unspecified angina pectoris: Secondary | ICD-10-CM | POA: Diagnosis not present

## 2015-12-01 DIAGNOSIS — G4700 Insomnia, unspecified: Secondary | ICD-10-CM | POA: Diagnosis present

## 2015-12-01 DIAGNOSIS — R197 Diarrhea, unspecified: Secondary | ICD-10-CM | POA: Diagnosis present

## 2015-12-01 HISTORY — DX: Unspecified dementia, unspecified severity, without behavioral disturbance, psychotic disturbance, mood disturbance, and anxiety: F03.90

## 2015-12-01 HISTORY — DX: Unspecified diastolic (congestive) heart failure: I50.30

## 2015-12-01 LAB — COMPREHENSIVE METABOLIC PANEL
ALT: 15 U/L (ref 14–54)
AST: 23 U/L (ref 15–41)
Albumin: 3.6 g/dL (ref 3.5–5.0)
Alkaline Phosphatase: 56 U/L (ref 38–126)
Anion gap: 6 (ref 5–15)
BILIRUBIN TOTAL: 0.6 mg/dL (ref 0.3–1.2)
BUN: 41 mg/dL — AB (ref 6–20)
CALCIUM: 8.5 mg/dL — AB (ref 8.9–10.3)
CHLORIDE: 115 mmol/L — AB (ref 101–111)
CO2: 22 mmol/L (ref 22–32)
CREATININE: 2.7 mg/dL — AB (ref 0.44–1.00)
GFR calc Af Amer: 21 mL/min — ABNORMAL LOW (ref >60)
GFR, EST NON AFRICAN AMERICAN: 18 mL/min — AB (ref >60)
Glucose, Bld: 114 mg/dL — ABNORMAL HIGH (ref 65–99)
Potassium: 4.4 mmol/L (ref 3.5–5.1)
SODIUM: 143 mmol/L (ref 135–145)
Total Protein: 7.1 g/dL (ref 6.5–8.1)

## 2015-12-01 LAB — CBC WITH DIFFERENTIAL/PLATELET
BASOS ABS: 0 10*3/uL (ref 0.0–0.1)
BASOS PCT: 0 %
EOS ABS: 0.1 10*3/uL (ref 0.0–0.7)
EOS PCT: 0 %
HCT: 40.9 % (ref 36.0–46.0)
Hemoglobin: 13.4 g/dL (ref 12.0–15.0)
LYMPHS PCT: 11 %
Lymphs Abs: 1.5 10*3/uL (ref 0.7–4.0)
MCH: 25.3 pg — ABNORMAL LOW (ref 26.0–34.0)
MCHC: 32.8 g/dL (ref 30.0–36.0)
MCV: 77.2 fL — AB (ref 78.0–100.0)
MONO ABS: 0.8 10*3/uL (ref 0.1–1.0)
Monocytes Relative: 6 %
Neutro Abs: 11.8 10*3/uL — ABNORMAL HIGH (ref 1.7–7.7)
Neutrophils Relative %: 83 %
PLATELETS: 163 10*3/uL (ref 150–400)
RBC: 5.3 MIL/uL — AB (ref 3.87–5.11)
RDW: 16.8 % — AB (ref 11.5–15.5)
WBC: 14.2 10*3/uL — AB (ref 4.0–10.5)

## 2015-12-01 LAB — URINALYSIS, ROUTINE W REFLEX MICROSCOPIC
BILIRUBIN URINE: NEGATIVE
Glucose, UA: NEGATIVE mg/dL
HGB URINE DIPSTICK: NEGATIVE
KETONES UR: NEGATIVE mg/dL
Leukocytes, UA: NEGATIVE
Nitrite: NEGATIVE
Protein, ur: NEGATIVE mg/dL
SPECIFIC GRAVITY, URINE: 1.016 (ref 1.005–1.030)
pH: 5 (ref 5.0–8.0)

## 2015-12-01 LAB — GLUCOSE, CAPILLARY
Glucose-Capillary: 64 mg/dL — ABNORMAL LOW (ref 65–99)
Glucose-Capillary: 76 mg/dL (ref 65–99)

## 2015-12-01 LAB — I-STAT CG4 LACTIC ACID, ED: Lactic Acid, Venous: 0.71 mmol/L (ref 0.5–2.0)

## 2015-12-01 LAB — C DIFFICILE QUICK SCREEN W PCR REFLEX
C DIFFICILE (CDIFF) INTERP: NEGATIVE
C DIFFICILE (CDIFF) TOXIN: NEGATIVE
C Diff antigen: NEGATIVE

## 2015-12-01 LAB — LIPASE, BLOOD: LIPASE: 31 U/L (ref 11–51)

## 2015-12-01 MED ORDER — ATORVASTATIN CALCIUM 10 MG PO TABS
20.0000 mg | ORAL_TABLET | Freq: Every day | ORAL | Status: DC
  Administered 2015-12-01 – 2015-12-11 (×10): 20 mg via ORAL
  Filled 2015-12-01 (×6): qty 2
  Filled 2015-12-01: qty 1
  Filled 2015-12-01: qty 2
  Filled 2015-12-01: qty 1
  Filled 2015-12-01: qty 2
  Filled 2015-12-01 (×4): qty 1
  Filled 2015-12-01: qty 2
  Filled 2015-12-01 (×3): qty 1

## 2015-12-01 MED ORDER — SODIUM CHLORIDE 0.9 % IV SOLN
INTRAVENOUS | Status: DC
  Administered 2015-12-01 – 2015-12-05 (×7): via INTRAVENOUS

## 2015-12-01 MED ORDER — HYDROXYZINE HCL 25 MG PO TABS
25.0000 mg | ORAL_TABLET | Freq: Every day | ORAL | Status: DC
  Administered 2015-12-01 – 2015-12-11 (×11): 25 mg via ORAL
  Filled 2015-12-01 (×13): qty 1

## 2015-12-01 MED ORDER — HYDRALAZINE HCL 20 MG/ML IJ SOLN
5.0000 mg | INTRAMUSCULAR | Status: DC | PRN
  Administered 2015-12-01: 5 mg via INTRAVENOUS
  Administered 2015-12-01 – 2015-12-05 (×2): 10 mg via INTRAVENOUS
  Filled 2015-12-01 (×3): qty 1

## 2015-12-01 MED ORDER — NICOTINE 7 MG/24HR TD PT24
7.0000 mg | MEDICATED_PATCH | Freq: Every day | TRANSDERMAL | Status: DC
  Administered 2015-12-01 – 2015-12-12 (×12): 7 mg via TRANSDERMAL
  Filled 2015-12-01 (×13): qty 1

## 2015-12-01 MED ORDER — DIATRIZOATE MEGLUMINE & SODIUM 66-10 % PO SOLN: 15.0000 mL | Freq: Once | ORAL | Status: DC

## 2015-12-01 MED ORDER — DIATRIZOATE MEGLUMINE & SODIUM 66-10 % PO SOLN
15.0000 mL | Freq: Once | ORAL | Status: AC
  Administered 2015-12-01: 15 mL via ORAL

## 2015-12-01 MED ORDER — ALBUTEROL SULFATE (2.5 MG/3ML) 0.083% IN NEBU: 2.5000 mg | INHALATION_SOLUTION | RESPIRATORY_TRACT | Status: DC | PRN

## 2015-12-01 MED ORDER — ASPIRIN EC 81 MG PO TBEC
81.0000 mg | DELAYED_RELEASE_TABLET | Freq: Every day | ORAL | Status: DC
  Administered 2015-12-01 – 2015-12-12 (×12): 81 mg via ORAL
  Filled 2015-12-01 (×13): qty 1

## 2015-12-01 MED ORDER — BACLOFEN 10 MG PO TABS
10.0000 mg | ORAL_TABLET | Freq: Three times a day (TID) | ORAL | Status: DC | PRN
  Administered 2015-12-08: 10 mg via ORAL
  Filled 2015-12-01 (×2): qty 1

## 2015-12-01 MED ORDER — SODIUM CHLORIDE 0.9 % IV BOLUS (SEPSIS)
500.0000 mL | Freq: Once | INTRAVENOUS | Status: AC
  Administered 2015-12-01: 500 mL via INTRAVENOUS

## 2015-12-01 MED ORDER — METRONIDAZOLE IN NACL 5-0.79 MG/ML-% IV SOLN
500.0000 mg | Freq: Three times a day (TID) | INTRAVENOUS | Status: DC
  Administered 2015-12-01 – 2015-12-03 (×6): 500 mg via INTRAVENOUS
  Filled 2015-12-01 (×7): qty 100

## 2015-12-01 MED ORDER — HYDROCODONE-ACETAMINOPHEN 5-325 MG PO TABS
1.0000 | ORAL_TABLET | Freq: Three times a day (TID) | ORAL | Status: DC | PRN
  Administered 2015-12-08 – 2015-12-12 (×5): 1 via ORAL
  Filled 2015-12-01 (×5): qty 1

## 2015-12-01 MED ORDER — ALBUTEROL SULFATE HFA 108 (90 BASE) MCG/ACT IN AERS: 1.0000 | INHALATION_SPRAY | RESPIRATORY_TRACT | Status: DC | PRN

## 2015-12-01 MED ORDER — VANCOMYCIN 50 MG/ML ORAL SOLUTION
125.0000 mg | Freq: Four times a day (QID) | ORAL | Status: DC
Start: 2015-12-01 — End: 2015-12-02
  Administered 2015-12-01: 125 mg via ORAL
  Filled 2015-12-01 (×8): qty 2.5

## 2015-12-01 MED ORDER — ONDANSETRON HCL 4 MG/2ML IJ SOLN: 4.0000 mg | Freq: Four times a day (QID) | INTRAMUSCULAR | Status: DC | PRN

## 2015-12-01 MED ORDER — METOPROLOL SUCCINATE ER 50 MG PO TB24
50.0000 mg | ORAL_TABLET | Freq: Every day | ORAL | Status: DC
  Administered 2015-12-01 – 2015-12-04 (×4): 50 mg via ORAL
  Filled 2015-12-01 (×5): qty 1

## 2015-12-01 MED ORDER — ONDANSETRON HCL 4 MG PO TABS: 4.0000 mg | ORAL_TABLET | Freq: Four times a day (QID) | ORAL | Status: DC | PRN

## 2015-12-01 MED ORDER — HYDROXYZINE PAMOATE 25 MG PO CAPS
25.0000 mg | ORAL_CAPSULE | Freq: Every day | ORAL | Status: DC
  Filled 2015-12-01: qty 1

## 2015-12-01 MED ORDER — QUETIAPINE FUMARATE 25 MG PO TABS
12.5000 mg | ORAL_TABLET | Freq: Every day | ORAL | Status: DC
  Administered 2015-12-01 – 2015-12-11 (×11): 12.5 mg via ORAL
  Filled 2015-12-01 (×13): qty 1

## 2015-12-01 MED ORDER — HEPARIN SODIUM (PORCINE) 5000 UNIT/ML IJ SOLN
5000.0000 [IU] | Freq: Three times a day (TID) | INTRAMUSCULAR | Status: DC
  Administered 2015-12-01 – 2015-12-12 (×32): 5000 [IU] via SUBCUTANEOUS
  Filled 2015-12-01 (×36): qty 1

## 2015-12-01 MED ORDER — DONEPEZIL HCL 10 MG PO TABS
5.0000 mg | ORAL_TABLET | Freq: Every day | ORAL | Status: DC
  Administered 2015-12-01 – 2015-12-11 (×11): 5 mg via ORAL
  Filled 2015-12-01 (×12): qty 1

## 2015-12-01 NOTE — Progress Notes (Signed)
Hypoglycemic Event  CBG: 64   Treatment: 15 GM carbohydrate snack  Symptoms: Hungry  Follow-up CBG: Time:20:26 CBG Result:76  Possible Reasons for Event: Inadequate meal intake  Comments/MD notified: Notified Dr. Konrad Dolores, stated OK if patient drinks sprite as requested  even though she is NPO    [name]

## 2015-12-01 NOTE — ED Provider Notes (Signed)
CSN: [id number]     Arrival date & time 12/01/15  4098 History   First MD Initiated Contact with Patient 12/01/15 0700     Chief Complaint  Patient presents with  . Abdominal Pain     (Consider location/radiation/quality/duration/timing/severity/associated sxs/prior Treatment) HPI Comments: Patient presents with abdominal pain. She has a history of a prior stroke, coronary artery disease, hypertension and nonischemic cardiomyopathy. She was seen here on June 12 for abdominal pain. She had a CT scan which showed mild diverticulitis and a question of a colorectal fistula. She was started on antibiotics and discharged back to home. She states she lives by herself but the prior ED notes that she went home with family. She states she's having worsening pain across her abdomen. She's had some diarrhea that started yesterday. She denies any fevers. No nausea or vomiting. No urinary symptoms. History is somewhat limited due to patient's confusion.  Patient is a 60 y.o. female presenting with abdominal pain.  Abdominal Pain Associated symptoms: diarrhea   Associated symptoms: no chest pain, no chills, no cough, no fatigue, no fever, no hematuria, no nausea, no shortness of breath and no vomiting     Past Medical History  Diagnosis Date  . Arthritis   . Stroke (HCC)   . Coronary artery disease     a. remote hx of cath ~2000. B. cath  moderate disease in an RPL branch - too distal for PCI; 3rd RPLB lesion, 50% stenosed; hyperdynamic LV with near cavity obliteration & severely elevated LVEDP  . Asthma   . 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 Non-ischemic cardiomyopathy (HCC)     cxr - mild cardiomegly, Echo 10/07/14 - severe LVH with LVEF 65-70%.  . Hypertension   . Heart attack (HCC)   . Chronic bronchitis (HCC)   . Heart murmur   . Wheelchair bound   . Bilateral chronic knee pain   . Accelerated hypertension    Past Surgical  History  Procedure Laterality Date  . Abdominal hysterectomy    . I&d extremity  08/18/2011    Procedure: IRRIGATION AND DEBRIDEMENT EXTREMITY;  Surgeon: Sharma Covert, MD;  Location: [facility] OR;  Service: Orthopedics;  Laterality: Right;  I&D Right Long Finger  . Amputation  08/21/2011    Procedure: AMPUTATION DIGIT;  Surgeon: Sharma Covert, MD;  Location: [facility] OR;  Service: Orthopedics;  Laterality: Right;  . Flexible sigmoidoscopy  04/28/2012    Procedure: FLEXIBLE SIGMOIDOSCOPY;  Surgeon: Petra Kuba, MD;  Location: [facility] ENDOSCOPY;  Service: Endoscopy;  Laterality: N/A;  unsedated, unprepped  . Cardiac catheterization N/A 10/09/2014    Procedure: Left Heart Cath and Coronary Angiography;  Surgeon: Marykay Lex, MD;  Location: [facility] INVASIVE CV LAB CUPID;  Service: Cardiovascular;  Laterality: N/A;  . Coronary stent placement    . Cardiac catheterization  10/2014    normal coronary arteries aside for a distal RPLB branch with 50% stenosis   Family History  Problem Relation Age of Onset  . Diabetes type I Mother   . Hypertension Mother   . Arthritis Mother   . Stroke Sister   . Diabetes type I Brother   . Hypertension Brother   . Healthy Brother   . Diabetes type II Brother   . Heart disease Brother   . Healthy Brother   . Healthy Brother   . Diabetes type I Sister   . Hypertension Sister   .  Asthma Sister   . Bronchitis Sister    Social History  Substance Use Topics  . Smoking status: Current Every Day Smoker -- 0.50 packs/day for 20 years    Types: Cigarettes  . Smokeless tobacco: Former Neurosurgeon    Types: Snuff  . Alcohol Use: 3.6 oz/week    6 Cans of beer per week     Comment: 04/27/12- "amount varies"   OB History    Gravida Para Term Preterm AB TAB SAB Ectopic Multiple Living   0 0 0 0 0 0 0 0       Review of Systems  Constitutional: Negative for fever, chills, diaphoresis and fatigue.  HENT: Negative for congestion, rhinorrhea and sneezing.   Eyes: Negative.    Respiratory: Negative for cough, chest tightness and shortness of breath.   Cardiovascular: Negative for chest pain and leg swelling.  Gastrointestinal: Positive for abdominal pain and diarrhea. Negative for nausea, vomiting and blood in stool.  Genitourinary: Negative for frequency, hematuria, flank pain and difficulty urinating.  Musculoskeletal: Negative for back pain and arthralgias.  Skin: Negative for rash.  Neurological: Negative for dizziness, speech difficulty, weakness, numbness and headaches.      Allergies  Penicillins  Home Medications   Prior to Admission medications   Medication Sig Start Date End Date Taking? Authorizing Provider  albuterol (PROVENTIL HFA;VENTOLIN HFA) 108 (90 BASE) MCG/ACT inhaler 1 to 2 puffs every 4 to 6 hours as needed Patient taking differently: Inhale 1-2 puffs into the lungs every 4 (four) hours as needed for wheezing or shortness of breath.  12/26/14  Yes Scott Moishe Spice, PA-C  atorvastatin (LIPITOR) 20 MG tablet Take 1 tablet (20 mg total) by mouth daily at 6 PM. 02/17/15  Yes Calvert Cantor, MD  baclofen (LIORESAL) 10 MG tablet Take 10 mg by mouth 3 (three) times daily as needed for muscle spasms.  11/07/15  Yes Historical Provider, MD  donepezil (ARICEPT) 10 MG tablet Take 1/2 tablet daily for 1 month, then increase to 1 tablet daily Patient taking differently: Take 5 mg by mouth at bedtime.  07/25/15  Yes Van Clines, MD  HYDROcodone-acetaminophen (NORCO/VICODIN) 5-325 MG tablet Take 1 tablet by mouth every 8 (eight) hours as needed for moderate pain or severe pain.  11/07/15  Yes Historical Provider, MD  hydrOXYzine (VISTARIL) 25 MG capsule Take 25 mg by mouth at bedtime. 11/07/15  Yes Historical Provider, MD  losartan (COZAAR) 50 MG tablet Take 50 mg by mouth daily.  02/17/15  Yes Historical Provider, MD  metoprolol succinate (TOPROL-XL) 50 MG 24 hr tablet Take 50 mg by mouth daily. 03/20/15  Yes Historical Provider, MD  QUEtiapine (SEROQUEL) 25  MG tablet Take 1/2 tablet at night Patient taking differently: Take 12.5 mg by mouth at bedtime.  07/25/15  Yes Van Clines, MD  ciprofloxacin (CIPRO) 500 MG tablet Take 1 tablet (500 mg total) by mouth every 12 (twelve) hours. Patient not taking: Reported on 12/01/2015 11/20/15   Eyvonne Mechanic, PA-C  metroNIDAZOLE (FLAGYL) 500 MG tablet Take 1 tablet (500 mg total) by mouth 2 (two) times daily. Patient not taking: Reported on 12/01/2015 11/20/15   Eyvonne Mechanic, PA-C   BP 201/70 mmHg  Pulse 81  Temp(Src) 98.3 F (36.8 C) (Oral)  Resp 20  SpO2 99% Physical Exam  Constitutional: She appears well-developed and well-nourished.  Morbidly obese  HENT:  Head: Normocephalic and atraumatic.  Eyes: Pupils are equal, round, and reactive to light.  Neck: Normal range of  motion. Neck supple.  Cardiovascular: Normal rate, regular rhythm and normal heart sounds.   Pulmonary/Chest: Effort normal and breath sounds normal. No respiratory distress. She has no wheezes. She has no rales. She exhibits no tenderness.  Abdominal: Soft. Bowel sounds are normal. There is tenderness (moderate diffuse TTP). There is no rebound and no guarding.  Musculoskeletal: Normal range of motion. She exhibits no edema.  Lymphadenopathy:    She has no cervical adenopathy.  Neurological: She is alert.  Patient is oriented to person and place but confused to day month and year.  Skin: Skin is warm and dry. No rash noted.  Psychiatric: She has a normal mood and affect.    ED Course  Procedures (including critical care time) Labs Review Results for orders placed or performed during the hospital encounter of 12/01/15  CBC with Differential/Platelet  Result Value Ref Range   WBC 14.2 (H) 4.0 - 10.5 K/uL   RBC 5.30 (H) 3.87 - 5.11 MIL/uL   Hemoglobin 13.4 12.0 - 15.0 g/dL   HCT 90.3 83.3 - 38.3 %   MCV 77.2 (L) 78.0 - 100.0 fL   MCH 25.3 (L) 26.0 - 34.0 pg   MCHC 32.8 30.0 - 36.0 g/dL   RDW 29.1 (H) 91.6 - 60.6 %    Platelets 163 150 - 400 K/uL   Neutrophils Relative % 83 %   Neutro Abs 11.8 (H) 1.7 - 7.7 K/uL   Lymphocytes Relative 11 %   Lymphs Abs 1.5 0.7 - 4.0 K/uL   Monocytes Relative 6 %   Monocytes Absolute 0.8 0.1 - 1.0 K/uL   Eosinophils Relative 0 %   Eosinophils Absolute 0.1 0.0 - 0.7 K/uL   Basophils Relative 0 %   Basophils Absolute 0.0 0.0 - 0.1 K/uL  Comprehensive metabolic panel  Result Value Ref Range   Sodium 143 135 - 145 mmol/L   Potassium 4.4 3.5 - 5.1 mmol/L   Chloride 115 (H) 101 - 111 mmol/L   CO2 22 22 - 32 mmol/L   Glucose, Bld 114 (H) 65 - 99 mg/dL   BUN 41 (H) 6 - 20 mg/dL   Creatinine, Ser 0.04 (H) 0.44 - 1.00 mg/dL   Calcium 8.5 (L) 8.9 - 10.3 mg/dL   Total Protein 7.1 6.5 - 8.1 g/dL   Albumin 3.6 3.5 - 5.0 g/dL   AST 23 15 - 41 U/L   ALT 15 14 - 54 U/L   Alkaline Phosphatase 56 38 - 126 U/L   Total Bilirubin 0.6 0.3 - 1.2 mg/dL   GFR calc non Af Amer 18 (L) >60 mL/min   GFR calc Af Amer 21 (L) >60 mL/min   Anion gap 6 5 - 15  Urinalysis, Routine w reflex microscopic (not at [facility])  Result Value Ref Range   Color, Urine YELLOW YELLOW   APPearance CLOUDY (A) CLEAR   Specific Gravity, Urine 1.016 1.005 - 1.030   pH 5.0 5.0 - 8.0   Glucose, UA NEGATIVE NEGATIVE mg/dL   Hgb urine dipstick NEGATIVE NEGATIVE   Bilirubin Urine NEGATIVE NEGATIVE   Ketones, ur NEGATIVE NEGATIVE mg/dL   Protein, ur NEGATIVE NEGATIVE mg/dL   Nitrite NEGATIVE NEGATIVE   Leukocytes, UA NEGATIVE NEGATIVE  Lipase, blood  Result Value Ref Range   Lipase 31 11 - 51 U/L   Ct Abdomen Pelvis Wo Contrast  12/01/2015  CLINICAL DATA:  Mid to right abdominal pain and generalized weakness. Diarrhea. EXAM: CT ABDOMEN AND PELVIS WITHOUT CONTRAST TECHNIQUE: Multidetector CT  imaging of the abdomen and pelvis was performed following the standard protocol without IV contrast. COMPARISON:  11/19/2015 CT abdomen/ pelvis. FINDINGS: Motion degraded study. Lower chest: Stable subsegmental scarring versus  atelectasis in the left lower lobe. Stable trace pericardial fluid/ thickening. Hepatobiliary: Normal liver with no liver mass. Cholecystectomy. No biliary ductal dilatation. Pancreas: Normal, with no mass or duct dilation. Spleen: Normal size. No mass. Adrenals/Urinary Tract: Normal adrenals. No hydronephrosis. Vascular calcifications are noted in the renal sinuses, with no renal stones. No contour deforming renal mass. Normal bladder. Stomach/Bowel: Grossly normal stomach. Normal caliber small bowel with no small bowel wall thickening. Appendix not discretely visualized. No pericecal inflammatory changes. Mild to moderate sigmoid diverticulosis. No appreciable colonic wall thickening or new pericolonic fat stranding. Vascular/Lymphatic: Atherosclerotic nonaneurysmal abdominal aorta. No pathologically enlarged lymph nodes in the abdomen or pelvis. Reproductive: Status post hysterectomy. Loss of the normal fat plane between the vaginal cuff and proximal sigmoid colon with associated ill-defined fluid and gas in the vaginal fornix and surrounding mild fat stranding, unchanged. No adnexal mass. Other: No pneumoperitoneum, ascites or focal fluid collection. Musculoskeletal: No aggressive appearing focal osseous lesions. Severe degenerative changes in the visualized thoracolumbar spine. IMPRESSION: 1. Mild to moderate sigmoid diverticulosis. Loss of the normal fat plane between the vaginal cuff and proximal sigmoid colon with associated ill-defined surrounding fat stranding and fluid and gas in the vaginal fornix. Colovaginal fistula cannot be excluded. 2. No focal drainable fluid collections. No evidence of bowel obstruction. Electronically Signed   By: Delbert Phenix M.D.   On: 12/01/2015 10:00   Ct Abdomen Pelvis W Contrast  11/20/2015  CLINICAL DATA:  Generalized abdominal pain and low back pain since last night. Poor IV access. Contrast material was administered by hand. No contrast material is demonstrated within  the vascular structures on the images. Contrast extravasation is not excluded. EXAM: CT ABDOMEN AND PELVIS WITH CONTRAST TECHNIQUE: Multidetector CT imaging of the abdomen and pelvis was performed using the standard protocol following bolus administration of intravenous contrast. CONTRAST:  ISOVUE-300 IOPAMIDOL (ISOVUE-300) INJECTION 61% COMPARISON:  05/24/2015 FINDINGS: Atelectasis in the lung bases. Surgical absence of the gallbladder. No bile duct dilatation. Unenhanced appearance of the liver, spleen, pancreas, adrenal glands, kidneys, abdominal aorta, inferior vena cava, and retroperitoneal lymph nodes is unremarkable. Stomach, small bowel, and colon are not abnormally distended. Contrast material flows to the colon without evidence of bowel obstruction. No free air or free fluid in the abdomen. Pelvis: Appendix is not identified. Uterus is surgically absent. Gas collection in the left pelvis appears to communicate with the vagina but is somewhat high. This may be postoperative or may indicate a vaginal fistula. Clinical correlation suggested. Diverticulosis of the sigmoid colon. Mild infiltration around the sigmoid colon may represent early diverticulitis. No abscess. No free or loculated pelvic fluid collections. No pelvic mass or lymphadenopathy. Degenerative changes in the lumbar spine. No destructive bone lesions. IMPRESSION: Probable mild changes of diverticulitis. No abscess. Gas collections somewhat high in the left pelvis may be postoperative changes related to vagina but a colovaginal fistula is not excluded and clinical correlation is suggested. Electronically Signed   By: Burman Nieves M.D.   On: 11/20/2015 01:00      Imaging Review Ct Abdomen Pelvis Wo Contrast  12/01/2015  CLINICAL DATA:  Mid to right abdominal pain and generalized weakness. Diarrhea. EXAM: CT ABDOMEN AND PELVIS WITHOUT CONTRAST TECHNIQUE: Multidetector CT imaging of the abdomen and pelvis was performed following  the standard protocol  without IV contrast. COMPARISON:  11/19/2015 CT abdomen/ pelvis. FINDINGS: Motion degraded study. Lower chest: Stable subsegmental scarring versus atelectasis in the left lower lobe. Stable trace pericardial fluid/ thickening. Hepatobiliary: Normal liver with no liver mass. Cholecystectomy. No biliary ductal dilatation. Pancreas: Normal, with no mass or duct dilation. Spleen: Normal size. No mass. Adrenals/Urinary Tract: Normal adrenals. No hydronephrosis. Vascular calcifications are noted in the renal sinuses, with no renal stones. No contour deforming renal mass. Normal bladder. Stomach/Bowel: Grossly normal stomach. Normal caliber small bowel with no small bowel wall thickening. Appendix not discretely visualized. No pericecal inflammatory changes. Mild to moderate sigmoid diverticulosis. No appreciable colonic wall thickening or new pericolonic fat stranding. Vascular/Lymphatic: Atherosclerotic nonaneurysmal abdominal aorta. No pathologically enlarged lymph nodes in the abdomen or pelvis. Reproductive: Status post hysterectomy. Loss of the normal fat plane between the vaginal cuff and proximal sigmoid colon with associated ill-defined fluid and gas in the vaginal fornix and surrounding mild fat stranding, unchanged. No adnexal mass. Other: No pneumoperitoneum, ascites or focal fluid collection. Musculoskeletal: No aggressive appearing focal osseous lesions. Severe degenerative changes in the visualized thoracolumbar spine. IMPRESSION: 1. Mild to moderate sigmoid diverticulosis. Loss of the normal fat plane between the vaginal cuff and proximal sigmoid colon with associated ill-defined surrounding fat stranding and fluid and gas in the vaginal fornix. Colovaginal fistula cannot be excluded. 2. No focal drainable fluid collections. No evidence of bowel obstruction. Electronically Signed   By: Delbert Phenix M.D.   On: 12/01/2015 10:00   I have personally reviewed and evaluated these images  and lab results as part of my medical decision-making.   EKG Interpretation None      MDM   Final diagnoses:  Diarrhea, unspecified type  Abdominal pain, unspecified abdominal location    Patient presents with diarrhea and abdominal pain. She's had 2 large stools in the ED. C. difficile testing was sent. Given her elevated white count and worsening abdominal pain, I did repeat a CT scan. There is no evidence of colitis. No obstruction. No visible abscess. There is concern for a possible colovaginal fistula. I did a pelvic exam which was a limited due to patient's size and mobility issues. There is some stool in the vaginal vault. I don't see any ongoing drainage. It's unclear whether this is leaking from a fistula or from the patient's diarrhea. She does have evidence of acute kidney injury. This is likely from possible dehydration. She was started on IV fluids. She does have confusion but I don't know what her baseline is. There is no family here with the patient. She doesn't have a fever. There is no other suggestions of sepsis. Her urine is normal. I will consult the hospitalist for admission.  I discussed the case with Dr. Sheliah Hatch with general surgery who will see the patient regarding the possible fistula.  I spoke with Dr. Konrad Dolores who will admit the pt to med/surg  Rolan Bucco, MD 12/01/15 1121

## 2015-12-01 NOTE — ED Notes (Addendum)
Pt transported from home by EMS c/o body aches. Pt was seen here earlier for same. Per EMS pt's family requesting placement for patient d/t "early dementia and pain". Per EMS pt had large amount of diarrhea, loose, green/yellow, pt gave no indication to EMS the need to have a BM nor complained after BM.   Pt points to mid abdomen when ask where pain was located, pt unable to give detailed description. Strong urine odor

## 2015-12-01 NOTE — ED Notes (Signed)
Stool sample taken to lab. Lab did not run test due to stool "not being liquid enough" Lab states a new sample must be collected. Floor RN will be made aware.

## 2015-12-01 NOTE — Progress Notes (Signed)
Patient admitted to 5E room 1518.  Alert and oriented to self and place only.  Oriented to unit, room, staff, and call bell.  Patient demonstrated understanding.  Patient assessed.  Family at bedside, no acute distress.  IV fluids infusing at 100/hr.  Will continue to monitor.

## 2015-12-01 NOTE — Consult Note (Signed)
Reason for Consult: concern for colovaginal fistula Referring Physician: Roxine Caddy  [name] is an 60 y.o. female.  HPI: 60 yo female with multiple medical problems presented with diarrhea and abdominal pain. [name] does not recall this happening before. In concern with colovaginal fistula, [name] is unsure of when stool was seen in her vagina and how long it is been occurring.  Past Medical History  Diagnosis Date  . Arthritis   . Stroke ([place])   . Coronary artery disease     a. remote hx of cath ~2000. B. cath  moderate disease in an RPL branch - too distal for PCI; 3rd RPLB lesion, 50% stenosed; hyperdynamic LV with near cavity obliteration & severely elevated LVEDP  . Asthma   . 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 Non-ischemic cardiomyopathy ([place])     cxr - mild cardiomegly, Echo 10/07/14 - severe LVH with LVEF 65-70%.  . Hypertension   . Heart attack ([place])   . Chronic bronchitis ([place])   . Heart murmur   . Wheelchair bound   . Bilateral chronic knee pain   . Accelerated hypertension   . Dementia   . Diastolic CHF [facility])     Past Surgical History  Procedure Laterality Date  . Abdominal hysterectomy    . I&d extremity  08/18/2011    Procedure: IRRIGATION AND DEBRIDEMENT EXTREMITY;  Surgeon: Linna Hoff, MD;  Location: [place];  Service: Orthopedics;  Laterality: Right;  I&D Right Long Finger  . Amputation  08/21/2011    Procedure: AMPUTATION DIGIT;  Surgeon: Linna Hoff, MD;  Location: [place];  Service: Orthopedics;  Laterality: Right;  . Flexible sigmoidoscopy  04/28/2012    Procedure: FLEXIBLE SIGMOIDOSCOPY;  Surgeon: Jeryl Columbia, MD;  Location: [facility];  Service: Endoscopy;  Laterality: N/A;  unsedated, unprepped  . Cardiac catheterization N/A 10/09/2014    Procedure: Left Heart Cath and Coronary Angiography;  Surgeon: Leonie Man, MD;  [facility] INVAS[facility] CV LAB CUPID;  Service: Cardiovascular;   Laterality: N/A;  . Coronary stent placement    . Cardiac catheterization  10/2014    normal coronary arteries aside for a distal RPLB branch with 50% stenosis    Family History  Problem Relation Age of Onset  . Diabetes type I Mother   . Hypertension Mother   . Arthritis Mother   . Stroke Sister   . Diabetes type I Brother   . Hypertension Brother   . Healthy Brother   . Diabetes type II Brother   . Heart disease Brother   . Healthy Brother   . Healthy Brother   . Diabetes type I Sister   . Hypertension Sister   . Asthma Sister   . Bronchitis Sister     Social History:  reports that [name] has been smoking Cigarettes.  [name] has a 10 pack-year smoking history. [name] has quit using smokeless tobacco. Her smokeless tobacco use included Snuff. [name] reports that [name] drinks about 3.6 oz of alcohol per week. [name] reports that [name] does not use illicit drugs.  Allergies:  Allergies  Allergen Reactions  . Penicillins Itching and Nausea And Vomiting    Has patient had a PCN reaction causing immediate rash, facial/tongue/throat swelling, SOB or lightheadedness with hypotension: No Has patient had a PCN reaction causing severe rash involving mucus membranes or skin necrosis: No Has patient had a PCN reaction that required hospitalization: No  Has patient had a PCN reaction occurring within the last 10 years: No      Medications: I have reviewed the patient's current medications.  Results for orders placed or performed during the hospital encounter of 12/01/15 (from the past 48 hour(s))  Urinalysis, Routine w reflex microscopic (not at [facility])     Status: Abnormal   Collection Time: 12/01/15  6:38 AM  Result Value Ref Range   Color, Urine YELLOW YELLOW   APPearance CLOUDY (A) CLEAR   Specific Gravity, Urine 1.016 1.005 - 1.030   pH 5.0 5.0 - 8.0   Glucose, UA NEGATIVE NEGATIVE mg/dL   Hgb urine dipstick NEGATIVE NEGATIVE   Bilirubin Urine NEGATIVE NEGATIVE   Ketones, ur NEGATIVE NEGATIVE  mg/dL   Protein, ur NEGATIVE NEGATIVE mg/dL   Nitrite NEGATIVE NEGATIVE   Leukocytes, UA NEGATIVE NEGATIVE    Comment: MICROSCOPIC NOT DONE ON URINES WITH NEGATIVE PROTEIN, BLOOD, LEUKOCYTES, NITRITE, OR GLUCOSE <1000 mg/dL.  CBC with Differential/Platelet     Status: Abnormal   Collection Time: 12/01/15  7:05 AM  Result Value Ref Range   WBC 14.2 (H) 4.0 - 10.5 K/uL   RBC 5.30 (H) 3.87 - 5.11 MIL/uL   Hemoglobin 13.4 12.0 - 15.0 g/dL   HCT 40.9 36.0 - 46.0 %   MCV 77.2 (L) 78.0 - 100.0 fL   MCH 25.3 (L) 26.0 - 34.0 pg   MCHC 32.8 30.0 - 36.0 g/dL   RDW 16.8 (H) 11.5 - 15.5 %   Platelets 163 150 - 400 K/uL   Neutrophils Relative % 83 %   Neutro Abs 11.8 (H) 1.7 - 7.7 K/uL   Lymphocytes Relative 11 %   Lymphs Abs 1.5 0.7 - 4.0 K/uL   Monocytes Relative 6 %   Monocytes Absolute 0.8 0.1 - 1.0 K/uL   Eosinophils Relative 0 %   Eosinophils Absolute 0.1 0.0 - 0.7 K/uL   Basophils Relative 0 %   Basophils Absolute 0.0 0.0 - 0.1 K/uL  Comprehensive metabolic panel     Status: Abnormal   Collection Time: 12/01/15  7:05 AM  Result Value Ref Range   Sodium 143 135 - 145 mmol/L   Potassium 4.4 3.5 - 5.1 mmol/L   Chloride 115 (H) 101 - 111 mmol/L   CO2 22 22 - 32 mmol/L   Glucose, Bld 114 (H) 65 - 99 mg/dL   BUN 41 (H) 6 - 20 mg/dL   Creatinine, Ser 2.70 (H) 0.44 - 1.00 mg/dL   Calcium 8.5 (L) 8.9 - 10.3 mg/dL   Total Protein 7.1 6.5 - 8.1 g/dL   Albumin 3.6 3.5 - 5.0 g/dL   AST 23 15 - 41 U/L   ALT 15 14 - 54 U/L   Alkaline Phosphatase 56 38 - 126 U/L   Total Bilirubin 0.6 0.3 - 1.2 mg/dL   GFR calc non Af Amer 18 (L) >60 mL/min   GFR calc Af Amer 21 (L) >60 mL/min    Comment: (NOTE) The eGFR has been calculated using the CKD EPI equation. This calculation has not been validated in all clinical situations. eGFR's persistently <60 mL/min signify possible Chronic Kidney Disease.    Anion gap 6 5 - 15  Lipase, blood     Status: None   Collection Time: 12/01/15  7:39 AM  Result  Value Ref Range   Lipase 31 11 - 51 U/L    Ct Abdomen Pelvis Wo Contrast  12/01/2015  CLINICAL DATA:  Mid to right abdominal  pain and generalized weakness. Diarrhea. EXAM: CT ABDOMEN AND PELVIS WITHOUT CONTRAST TECHNIQUE: Multidetector CT imaging of the abdomen and pelvis was performed following the standard protocol without IV contrast. COMPARISON:  11/19/2015 CT abdomen/ pelvis. FINDINGS: Motion degraded study. Lower chest: Stable subsegmental scarring versus atelectasis in the left lower lobe. Stable trace pericardial fluid/ thickening. Hepatobiliary: Normal liver with no liver mass. Cholecystectomy. No biliary ductal dilatation. Pancreas: Normal, with no mass or duct dilation. Spleen: Normal size. No mass. Adrenals/Urinary Tract: Normal adrenals. No hydronephrosis. Vascular calcifications are noted in the renal sinuses, with no renal stones. No contour deforming renal mass. Normal bladder. Stomach/Bowel: Grossly normal stomach. Normal caliber small bowel with no small bowel wall thickening. Appendix not discretely visualized. No pericecal inflammatory changes. Mild to moderate sigmoid diverticulosis. No appreciable colonic wall thickening or new pericolonic fat stranding. Vascular/Lymphatic: Atherosclerotic nonaneurysmal abdominal aorta. No pathologically enlarged lymph nodes in the abdomen or pelvis. Reproductive: Status post hysterectomy. Loss of the normal fat plane between the vaginal cuff and proximal sigmoid colon with associated ill-defined fluid and gas in the vaginal fornix and surrounding mild fat stranding, unchanged. No adnexal mass. Other: No pneumoperitoneum, ascites or focal fluid collection. Musculoskeletal: No aggressive appearing focal osseous lesions. Severe degenerative changes in the visualized thoracolumbar spine. IMPRESSION: 1. Mild to moderate sigmoid diverticulosis. Loss of the normal fat plane between the vaginal cuff and proximal sigmoid colon with associated ill-defined  surrounding fat stranding and fluid and gas in the vaginal fornix. Colovaginal fistula cannot be excluded. 2. No focal drainable fluid collections. No evidence of bowel obstruction. Electronically Signed   By: Ilona Sorrel M.D.   On: 12/01/2015 10:00    Review of Systems  Unable to perform ROS  Blood pressure 201/70, pulse 81, temperature 98.3 F (36.8 C), temperature source Oral, resp. rate 20, SpO2 99 %. Physical Exam  Vitals reviewed. Constitutional: [name] is oriented to person, place, and time. [name] appears well-developed and well-nourished.  Morbid obesity, central obesity distribution  HENT:  Head: Normocephalic and atraumatic.  Eyes: Conjunctivae and EOM are normal. Pupils are equal, round, and reactive to light.  Neck: Normal range of motion. Neck supple.  Cardiovascular: Normal rate and regular rhythm.   Respiratory: Effort normal and breath sounds normal.  GI: Soft. Bowel sounds are normal. [name] exhibits no distension. There is no tenderness.  Musculoskeletal: Normal range of motion.  Neurological: [name] is alert and oriented to person, place, and time.  Skin: Skin is warm and dry.  Psychiatric: [name] has a normal mood and affect. Her behavior is normal.    Assessment/Plan: 60 yo female here with diarrhea concern for gastroenteritis vs colitis. On CT inflammation of sigmoid in area of vagina concerning for fistula -Due to her multiple untreated medical issues and current activity status of nonambulatory, the only surgical option at this point wound be an end colostomy to allow the pelvic issues to heal. The patient is not interested in this at this time and given her current hospitalization [name] is probably best treated with antibiotics -If [name] would like to discuss surgery to treat this chronic issue [name] can follow up in the clinic with me after the hospitalization.  Arta Bruce Kinsinger 12/01/2015, 12:30 PM

## 2015-12-01 NOTE — ED Notes (Signed)
Lab states they will run lipase on previously collected blood.

## 2015-12-01 NOTE — ED Notes (Addendum)
MD paged to make aware of patient's blood pressure reading. Waiting for response.

## 2015-12-01 NOTE — H&P (Signed)
History and Physical    [name] ZOX:[id number] DOB: 03-Feb-1956 DOA: 12/01/2015  PCP: Jackie Plum, MD Patient coming from: Home  Chief Complaint: Abd pain  HPI: [name] is a 60 y.o. female with medical history significant of arthritis, CVA, CAD/MI, diverticulosis with history of diverticular abscess in 2009, asthma, HTN, dementia, HTN  Abdominal pain. Recurrent episode. Diagnosed with diverticulitis on June 12 and was sent home with Cipro Flagyl at that point in time. Patient unclear with regards to what medicine she takes at home. States that she only received one medicine from the pharmacy after her last ED visit but states compliance with that medicine twice daily. Patient also noted to have a possible colorectal fistula. Recurrent abdominal pain started one day ago. Associated with diarrhea. Denies fevers, nausea, vomiting, dysuria, frequency, rash, neck stiffness, headache. Patient states that her pain is in the middle of her belly but is unable to get any further details with regards to radiation or quality of the pain.  Level V caveat applies given patient's dementia and on reliable history giving.   Discussed patient's condition with daughter, [name], who endorses that patient struggles with dementia. While she is able to perform most of her ADLs she is unable to recall specific details, probably does medications, and sometimes is completely confused about date and location and persons.  The designated power of attorney the patient has several daughters who are actively caring for her. Patient's closest daughter, [name], checks on her daily and helps give her medications though there is some concern from other sisters that her care has not been adequate.    ED Course: 500cc IVF given. Objective findings below. Concern for colovaginal fistula w/ stool in vaginal vault. Surgery consulted.  Review of Systems: As per HPI otherwise 10 point review of  systems negative.   Ambulatory Status:fairly sedentary.  Past Medical History  Diagnosis Date  . Arthritis   . Stroke (HCC)   . Coronary artery disease     a. remote hx of cath ~2000. B. cath  moderate disease in an RPL branch - too distal for PCI; 3rd RPLB lesion, 50% stenosed; hyperdynamic LV with near cavity obliteration & severely elevated LVEDP  . Asthma   . 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 Non-ischemic cardiomyopathy (HCC)     cxr - mild cardiomegly, Echo 10/07/14 - severe LVH with LVEF 65-70%.  . Hypertension   . Heart attack (HCC)   . Chronic bronchitis (HCC)   . Heart murmur   . Wheelchair bound   . Bilateral chronic knee pain   . Accelerated hypertension   . Dementia   . Diastolic CHF [facility])     Past Surgical History  Procedure Laterality Date  . Abdominal hysterectomy    . I&d extremity  08/18/2011    Procedure: IRRIGATION AND DEBRIDEMENT EXTREMITY;  Surgeon: Sharma Covert, MD;  Location: [facility] OR;  Service: Orthopedics;  Laterality: Right;  I&D Right Long Finger  . Amputation  08/21/2011    Procedure: AMPUTATION DIGIT;  Surgeon: Sharma Covert, MD;  Location: [facility] OR;  Service: Orthopedics;  Laterality: Right;  . Flexible sigmoidoscopy  04/28/2012    Procedure: FLEXIBLE SIGMOIDOSCOPY;  Surgeon: Petra Kuba, MD;  Location: [facility] ENDOSCOPY;  Service: Endoscopy;  Laterality: N/A;  unsedated, unprepped  . Cardiac catheterization N/A 10/09/2014    Procedure: Left Heart Cath and Coronary Angiography;  Surgeon: Onalee Hua  Starr Lake, MD;  Location: [facility] INVASIVE CV LAB CUPID;  Service: Cardiovascular;  Laterality: N/A;  . Coronary stent placement    . Cardiac catheterization  10/2014    normal coronary arteries aside for a distal RPLB branch with 50% stenosis    Social History   Social History  . Marital Status: Legally Separated    Spouse Name: N/A  . Number of Children: N/A  . Years of Education: N/A   Occupational  History  . Not on file.   Social History Main Topics  . Smoking status: Current Every Day Smoker -- 0.50 packs/day for 20 years    Types: Cigarettes  . Smokeless tobacco: Former Neurosurgeon    Types: Snuff  . Alcohol Use: 3.6 oz/week    6 Cans of beer per week     Comment: 04/27/12- "amount varies"  . Drug Use: No  . Sexual Activity: Not on file   Other Topics Concern  . Not on file   Social History Narrative   ** Merged History Encounter **        Allergies  Allergen Reactions  . Penicillins Itching and Nausea And Vomiting    Has patient had a PCN reaction causing immediate rash, facial/tongue/throat swelling, SOB or lightheadedness with hypotension: No Has patient had a PCN reaction causing severe rash involving mucus membranes or skin necrosis: No Has patient had a PCN reaction that required hospitalization: No Has patient had a PCN reaction occurring within the last 10 years: No      Family History  Problem Relation Age of Onset  . Diabetes type I Mother   . Hypertension Mother   . Arthritis Mother   . Stroke Sister   . Diabetes type I Brother   . Hypertension Brother   . Healthy Brother   . Diabetes type II Brother   . Heart disease Brother   . Healthy Brother   . Healthy Brother   . Diabetes type I Sister   . Hypertension Sister   . Asthma Sister   . Bronchitis Sister     Prior to Admission medications   Medication Sig Start Date End Date Taking? Authorizing Provider  albuterol (PROVENTIL HFA;VENTOLIN HFA) 108 (90 BASE) MCG/ACT inhaler 1 to 2 puffs every 4 to 6 hours as needed Patient taking differently: Inhale 1-2 puffs into the lungs every 4 (four) hours as needed for wheezing or shortness of breath.  12/26/14  Yes Scott Moishe Spice, PA-C  atorvastatin (LIPITOR) 20 MG tablet Take 1 tablet (20 mg total) by mouth daily at 6 PM. 02/17/15  Yes Calvert Cantor, MD  baclofen (LIORESAL) 10 MG tablet Take 10 mg by mouth 3 (three) times daily as needed for muscle spasms.   11/07/15  Yes Historical Provider, MD  donepezil (ARICEPT) 10 MG tablet Take 1/2 tablet daily for 1 month, then increase to 1 tablet daily Patient taking differently: Take 5 mg by mouth at bedtime.  07/25/15  Yes Van Clines, MD  HYDROcodone-acetaminophen (NORCO/VICODIN) 5-325 MG tablet Take 1 tablet by mouth every 8 (eight) hours as needed for moderate pain or severe pain.  11/07/15  Yes Historical Provider, MD  hydrOXYzine (VISTARIL) 25 MG capsule Take 25 mg by mouth at bedtime. 11/07/15  Yes Historical Provider, MD  losartan (COZAAR) 50 MG tablet Take 50 mg by mouth daily.  02/17/15  Yes Historical Provider, MD  metoprolol succinate (TOPROL-XL) 50 MG 24 hr tablet Take 50 mg by mouth daily. 03/20/15  Yes Historical Provider,  MD  QUEtiapine (SEROQUEL) 25 MG tablet Take 1/2 tablet at night Patient taking differently: Take 12.5 mg by mouth at bedtime.  07/25/15  Yes Van Clines, MD  ciprofloxacin (CIPRO) 500 MG tablet Take 1 tablet (500 mg total) by mouth every 12 (twelve) hours. Patient not taking: Reported on 12/01/2015 11/20/15   Eyvonne Mechanic, PA-C  metroNIDAZOLE (FLAGYL) 500 MG tablet Take 1 tablet (500 mg total) by mouth 2 (two) times daily. Patient not taking: Reported on 12/01/2015 11/20/15   Eyvonne Mechanic, PA-C    Physical Exam: Filed Vitals:   12/01/15 0703 12/01/15 0830 12/01/15 0900 12/01/15 1045  BP: 192/91 190/64 178/80 201/70  Pulse: 55 56 56 81  Temp: 98.3 F (36.8 C)     TempSrc: Oral     Resp: SpO2: 99% 99% 99% 99%     General:  Appears calm and comfortable Eyes:  PERRL, EOMI, normal lids ENT: Dry mm w/ poor dentition Neck:  no LAD, masses or thyromegaly Cardiovascular:  RRR, no m/r/g. trace LE edema.  Respiratory:  CTA bilaterally, no w/r/r. Normal respiratory effort. Abdomen: Mild tenderness to deep palpation in the. The local region, normoactive bowel sounds, nondistended, no CVA tenderness in the flank bilateral Skin:  no rash or induration seen on  limited exam Musculoskeletal: Missing portion of the right fourth finger noted, no other bony abnormalities appreciated, grossly normal tone BUE/BLE,  Psychiatric: Patient alert and oriented 2, continues to stay, "Yall keep playing games" when asked various questions. Follow basic commands. Somewhat flattened affect. Neurologic: Social recent coronary fashion, cranial nerves II through XII grossly intact,  Labs on Admission: I have personally reviewed following labs and imaging studies  CBC:  Recent Labs Lab 12/01/15 0705  WBC 14.2*  NEUTROABS 11.8*  HGB 13.4  HCT 40.9  MCV 77.2*  PLT 163   Basic Metabolic Panel:  Recent Labs Lab 12/01/15 0705  NA 143  K 4.4  CL 115*  CO2 22  GLUCOSE 114*  BUN 41*  CREATININE 2.70*  CALCIUM 8.5*   GFR: CrCl cannot be calculated (Unknown ideal weight.). Liver Function Tests:  Recent Labs Lab 12/01/15 0705  AST 23  ALT 15  ALKPHOS 56  BILITOT 0.6  PROT 7.1  ALBUMIN 3.6    Recent Labs Lab 12/01/15 0739  LIPASE 31   No results for input(s): AMMONIA in the last 168 hours. Coagulation Profile: No results for input(s): INR, PROTIME in the last 168 hours. Cardiac Enzymes: No results for input(s): CKTOTAL, CKMB, CKMBINDEX, TROPONINI in the last 168 hours. BNP (last 3 results) No results for input(s): PROBNP in the last 8760 hours. HbA1C: No results for input(s): HGBA1C in the last 72 hours. CBG: No results for input(s): GLUCAP in the last 168 hours. Lipid Profile: No results for input(s): CHOL, HDL, LDLCALC, TRIG, CHOLHDL, LDLDIRECT in the last 72 hours. Thyroid Function Tests: No results for input(s): TSH, T4TOTAL, FREET4, T3FREE, THYROIDAB in the last 72 hours. Anemia Panel: No results for input(s): VITAMINB12, FOLATE, FERRITIN, TIBC, IRON, RETICCTPCT in the last 72 hours. Urine analysis:    Component Value Date/Time   COLORURINE YELLOW 12/01/2015 0638   APPEARANCEUR CLOUDY* 12/01/2015 0638   LABSPEC 1.016  12/01/2015 0638   PHURINE 5.0 12/01/2015 0638   GLUCOSEU NEGATIVE 12/01/2015 0638   HGBUR NEGATIVE 12/01/2015 0638   BILIRUBINUR NEGATIVE 12/01/2015 0638   KETONESUR NEGATIVE 12/01/2015 0638   PROTEINUR NEGATIVE 12/01/2015 0638   UROBILINOGEN 0.2 02/15/2015 1615  NITRITE NEGATIVE 12/01/2015 0638   LEUKOCYTESUR NEGATIVE 12/01/2015 1914    Creatinine Clearance: CrCl cannot be calculated (Unknown ideal weight.).  Sepsis Labs: @LABRCNTIP (procalcitonin:4,lacticidven:4) )No results found for this or any previous visit (from the past 240 hour(s)).   Radiological Exams on Admission: Ct Abdomen Pelvis Wo Contrast  12/01/2015  CLINICAL DATA:  Mid to right abdominal pain and generalized weakness. Diarrhea. EXAM: CT ABDOMEN AND PELVIS WITHOUT CONTRAST TECHNIQUE: Multidetector CT imaging of the abdomen and pelvis was performed following the standard protocol without IV contrast. COMPARISON:  11/19/2015 CT abdomen/ pelvis. FINDINGS: Motion degraded study. Lower chest: Stable subsegmental scarring versus atelectasis in the left lower lobe. Stable trace pericardial fluid/ thickening. Hepatobiliary: Normal liver with no liver mass. Cholecystectomy. No biliary ductal dilatation. Pancreas: Normal, with no mass or duct dilation. Spleen: Normal size. No mass. Adrenals/Urinary Tract: Normal adrenals. No hydronephrosis. Vascular calcifications are noted in the renal sinuses, with no renal stones. No contour deforming renal mass. Normal bladder. Stomach/Bowel: Grossly normal stomach. Normal caliber small bowel with no small bowel wall thickening. Appendix not discretely visualized. No pericecal inflammatory changes. Mild to moderate sigmoid diverticulosis. No appreciable colonic wall thickening or new pericolonic fat stranding. Vascular/Lymphatic: Atherosclerotic nonaneurysmal abdominal aorta. No pathologically enlarged lymph nodes in the abdomen or pelvis. Reproductive: Status post hysterectomy. Loss of the normal  fat plane between the vaginal cuff and proximal sigmoid colon with associated ill-defined fluid and gas in the vaginal fornix and surrounding mild fat stranding, unchanged. No adnexal mass. Other: No pneumoperitoneum, ascites or focal fluid collection. Musculoskeletal: No aggressive appearing focal osseous lesions. Severe degenerative changes in the visualized thoracolumbar spine. IMPRESSION: 1. Mild to moderate sigmoid diverticulosis. Loss of the normal fat plane between the vaginal cuff and proximal sigmoid colon with associated ill-defined surrounding fat stranding and fluid and gas in the vaginal fornix. Colovaginal fistula cannot be excluded. 2. No focal drainable fluid collections. No evidence of bowel obstruction. Electronically Signed   By: Delbert Phenix M.D.   On: 12/01/2015 10:00     Assessment/Plan Active Problems:   Cigarette smoker   CAD (coronary artery disease)   Hypertensive heart disease   HLD (hyperlipidemia)   AKI (acute kidney injury) (HCC)   Diarrhea   Colovaginal fistula   Dementia   Insomnia   Chronic pain   Chronic diastolic heart failure (HCC)   Abdominal pain:  Likely secondary to colovaginal fistula from chronic diverticulosis but cannot rule out abdominal infection such as C. difficile. Patient seen in the ED on 11/19/2015 and sent home with pressures for Cipro and Flagyl. Patient gets an unreliable history of medicines that she is taking and endorses only taking one antibiotic after discharge. C. difficile test pending. Stool found in the vaginal vault by EDP. Afebrile, hypertensive but otherwise hemodynamically stable. Creatinine 2.7, WBC 14.2 with left shift, - MedSurg - Blood cultures, C. difficile, enteric precautions - DC Cipro - Start by mouth vancomycin and IV Flagyl - Additional stool pathogen panel - Lactoferrin, lactic acid - IVF - Follow-up surgical recommendations  AKI: Cr 2.7 w/ BUN 41. Baseline Cr 0.9 as of 6/12. Pt cares for self and suspect  from prerenal from diarrhea.  - IVF - hold losartan - BMET in am  Psychosocial: Patient apparently lives at home with one daughter is the primary intermittent caregiver. Patient with dementia and unable to properly care for herself. Discussed case with Mrs. [name], one of the patient's daughters. Discussed need for somebody to be designated as POA.  -  Social work, case management  - Anticipate placement in nursing home or assisted living facility  Hyperglycemia: 114. Last A1v in pre-dm range of 5.8 in 2016.  - A1c - CBG monitorning  Dementia: unclear if at baseline given waxing and waning nature of dementia and no family present. [name], pts daughter, endorses intermittent behavior similar to current presentation - continue aricept, seroquel  HLD: - continue statin  HTN: poorly controlled. Question compliance - continue metop - hold losartan due to AKI  Chronic pain:: - continue baclofen, norco  INsomnia: - continue vistaril  Chronic diastolic congestive heart failure: Last echo from September 2016 showing EF of 55% but diastolic dysfunction. Trace LE edema on admission. - strict I/O, Dly wts, - continue bblocker - ARB held due to AKI - monitor for diuresis need  H/o MI/CAD/CVA:  - Restart ASA - cont statin    DVT prophylaxis: Hep  Code Status: FULL  Family Communication: Daughter [name]  Disposition Plan: pendign improvement - anticipate several day stay  Consults called: General surgery by EDP  Admission status: minimal ambulation    Tzipporah Nagorski J MD [facility]  If 7PM-7AM, please contact night-coverage [email or web address] Password [facility]  12/01/2015, 12:24 PM

## 2015-12-01 NOTE — ED Notes (Signed)
RN and 2 NTs attempted blood draw. Main lab aware and state they will come attempt to draw patient's blood.

## 2015-12-01 NOTE — ED Notes (Signed)
Attempted to get blood was unsuccessful Fortino Sic made aware.

## 2015-12-02 DIAGNOSIS — I10 Essential (primary) hypertension: Secondary | ICD-10-CM

## 2015-12-02 LAB — COMPREHENSIVE METABOLIC PANEL
ALBUMIN: 3.3 g/dL — AB (ref 3.5–5.0)
ALT: 13 U/L — AB (ref 14–54)
AST: 21 U/L (ref 15–41)
Alkaline Phosphatase: 49 U/L (ref 38–126)
Anion gap: 6 (ref 5–15)
BUN: 28 mg/dL — AB (ref 6–20)
CO2: 22 mmol/L (ref 22–32)
CREATININE: 1.95 mg/dL — AB (ref 0.44–1.00)
Calcium: 8.4 mg/dL — ABNORMAL LOW (ref 8.9–10.3)
Chloride: 113 mmol/L — ABNORMAL HIGH (ref 101–111)
GFR calc Af Amer: 31 mL/min — ABNORMAL LOW (ref >60)
GFR, EST NON AFRICAN AMERICAN: 27 mL/min — AB (ref >60)
GLUCOSE: 131 mg/dL — AB (ref 65–99)
POTASSIUM: 3.8 mmol/L (ref 3.5–5.1)
Sodium: 141 mmol/L (ref 135–145)
Total Bilirubin: 0.8 mg/dL (ref 0.3–1.2)
Total Protein: 6.6 g/dL (ref 6.5–8.1)

## 2015-12-02 LAB — GLUCOSE, CAPILLARY
GLUCOSE-CAPILLARY: 129 mg/dL — AB (ref 65–99)
Glucose-Capillary: 117 mg/dL — ABNORMAL HIGH (ref 65–99)
Glucose-Capillary: 82 mg/dL (ref 65–99)
Glucose-Capillary: 90 mg/dL (ref 65–99)

## 2015-12-02 LAB — GASTROINTESTINAL PANEL BY PCR, STOOL (REPLACES STOOL CULTURE)
ADENOVIRUS F40/41: NOT DETECTED
ASTROVIRUS: NOT DETECTED
CAMPYLOBACTER SPECIES: NOT DETECTED
CYCLOSPORA CAYETANENSIS: NOT DETECTED
Cryptosporidium: NOT DETECTED
E. coli O157: NOT DETECTED
ENTEROPATHOGENIC E COLI (EPEC): NOT DETECTED
ENTEROTOXIGENIC E COLI (ETEC): NOT DETECTED
Entamoeba histolytica: NOT DETECTED
Enteroaggregative E coli (EAEC): NOT DETECTED
Giardia lamblia: NOT DETECTED
Norovirus GI/GII: NOT DETECTED
Plesimonas shigelloides: NOT DETECTED
ROTAVIRUS A: NOT DETECTED
SHIGA LIKE TOXIN PRODUCING E COLI (STEC): NOT DETECTED
Salmonella species: NOT DETECTED
Sapovirus (I, II, IV, and V): NOT DETECTED
Shigella/Enteroinvasive E coli (EIEC): NOT DETECTED
VIBRIO SPECIES: NOT DETECTED
Vibrio cholerae: NOT DETECTED
Yersinia enterocolitica: NOT DETECTED

## 2015-12-02 LAB — CBC
HEMATOCRIT: 40.3 % (ref 36.0–46.0)
Hemoglobin: 13.4 g/dL (ref 12.0–15.0)
MCH: 25.4 pg — AB (ref 26.0–34.0)
MCHC: 33.3 g/dL (ref 30.0–36.0)
MCV: 76.3 fL — AB (ref 78.0–100.0)
PLATELETS: 147 10*3/uL — AB (ref 150–400)
RBC: 5.28 MIL/uL — ABNORMAL HIGH (ref 3.87–5.11)
RDW: 16.8 % — AB (ref 11.5–15.5)
WBC: 6.1 10*3/uL (ref 4.0–10.5)

## 2015-12-02 MED ORDER — CIPROFLOXACIN IN D5W 400 MG/200ML IV SOLN
400.0000 mg | Freq: Two times a day (BID) | INTRAVENOUS | Status: DC
  Administered 2015-12-02 – 2015-12-03 (×3): 400 mg via INTRAVENOUS
  Filled 2015-12-02 (×3): qty 200

## 2015-12-02 MED ORDER — HALOPERIDOL LACTATE 5 MG/ML IJ SOLN
2.5000 mg | Freq: Once | INTRAMUSCULAR | Status: AC
  Administered 2015-12-02: 2.5 mg via INTRAVENOUS
  Filled 2015-12-02: qty 1

## 2015-12-02 NOTE — Evaluation (Signed)
Occupational Therapy Evaluation Patient Details Name: NOLEEN BULICK MRN: [id number] DOB: 1956-03-20 Today's Date: 12/02/2015    History of Present Illness This is a 60 year old African-American female with a past medical history of dementia, arthritis, stroke, diverticulosis, hypertension, presented with complaints of abdominal pain. She was found to have diverticulitis on June 12 and was sent home on Cipro and Flagyl. She presented with recurrent abdominal pain. Inflammation was noted around the sigmoid colon with the possibility of colo-vaginal fistula   Clinical Impression   Pt admitted with stomach pain. Pt currently with functional limitations due to the deficits listed below (see OT Problem List).  Pt will benefit from skilled OT to increase their safety and independence with ADL and functional mobility for ADL to facilitate discharge to venue listed below.      Follow Up Recommendations  SNF    Equipment Recommendations  None recommended by OT       Precautions / Restrictions Precautions Precautions: Fall      Mobility Bed Mobility Overal bed mobility: +2 for physical assistance;Needs Assistance Bed Mobility: Supine to Sit     Supine to sit: Max assist;+2 for physical assistance;+2 for safety/equipment        Transfers Overall transfer level: Needs assistance Equipment used: 2 person hand held assist Transfers: Sit to/from [employer] Transfers Sit to Stand: Max assist;+2 physical assistance;+2 safety/equipment Stand pivot transfers: Max assist;+2 physical assistance;+2 safety/equipment                 ADL Overall ADL's : Needs assistance/impaired Eating/Feeding: Set up;Sitting                                     General ADL Comments: Pt would need significant A with ADL activity.  Pt will likey need SNF as do not feel pt will be able to care for herself                Pertinent Vitals/Pain Pain Assessment: Faces Faces  Pain Scale: Hurts little more Pain Location: abdomen Pain Descriptors / Indicators: Sore Pain Intervention(s): Monitored during session;Repositioned     Hand Dominance     Extremity/Trunk Assessment Upper Extremity Assessment Upper Extremity Assessment: Generalized weakness           Communication Communication Communication: No difficulties   Cognition Arousal/Alertness: Awake/alert Behavior During Therapy: WFL for tasks assessed/performed Overall Cognitive Status: Within Functional Limits for tasks assessed                                Home Living Family/patient expects to be discharged to:: Private residence Living Arrangements: Alone Available Help at Discharge: Family Type of Home: House Home Access: Ramped entrance     Home Layout: One level     Bathroom Shower/Tub: Producer, television/film/video: Standard     Home Equipment: Other (comment) (scooter)   Additional Comments: has BSC and tub seat.        Prior Functioning/Environment Level of Independence: Independent with assistive device(s);Needs assistance  Gait / Transfers Assistance Needed: pt has a scooter          OT Diagnosis: Generalized weakness   OT Problem List: Decreased strength;Decreased activity tolerance;Decreased safety awareness;Impaired balance (sitting and/or standing)   OT Treatment/Interventions: Self-care/ADL training;DME and/or AE instruction;Patient/family education    OT Goals(Current goals can be found  in the care plan section) Acute Rehab OT Goals Patient Stated Goal: did not state OT Goal Formulation: With patient Time For Goal Achievement: 12/16/15 Potential to Achieve Goals: Good  OT Frequency: Min 2X/week   Barriers to D/C: Decreased caregiver support          Co-evaluation PT/OT/SLP Co-Evaluation/Treatment: Yes Reason for Co-Treatment: For patient/therapist safety   OT goals addressed during session: ADL's and self-care      End of  Session Nurse Communication: Mobility status  Activity Tolerance: Patient tolerated treatment well Patient left: in chair;with call bell/phone within reach;with chair alarm set   Time: 8119-1478 OT Time Calculation (min): 21 min Charges:  OT General Charges $OT Visit: 1 Procedure OT Evaluation $OT Eval Moderate Complexity: 1 Procedure G-Codes:    Einar Crow D 12/06/2015, 12:14 PM

## 2015-12-02 NOTE — Evaluation (Signed)
Physical Therapy Evaluation Patient Details Name: [name] MRN: [id number] DOB: 06-Aug-1955 Today's Date: 12/02/2015   History of Present Illness  This is a 60 year old African-American female with a past medical history of dementia, arthritis, stroke, diverticulosis, hypertension, presented with complaints of abdominal pain. She was found to have diverticulitis on June 12 and was sent home on Cipro and Flagyl. She presented with recurrent abdominal pain. Inflammation was noted around the sigmoid colon with the possibility of colo-vaginal fistula  Clinical Impression  Pt admitted as above and presenting with functional mobility limitations 2* generalized weakness, balance deficits and obesity.  Pt would benefit from follow up rehab at SNF level to maximize IND and safety prior to return home with ltd assist.    Follow Up Recommendations SNF    Equipment Recommendations  None recommended by PT    Recommendations for Other Services OT consult     Precautions / Restrictions Precautions Precautions: Fall Restrictions Weight Bearing Restrictions: No      Mobility  Bed Mobility Overal bed mobility: +2 for physical assistance;Needs Assistance Bed Mobility: Supine to Sit     Supine to sit: Max assist;+2 for physical assistance;+2 for safety/equipment     General bed mobility comments: cues for sequence.  Utilized pad to bring pt to EOB with HOB elevated  Transfers Overall transfer level: Needs assistance Equipment used: 2 person hand held assist Transfers: Sit to/from Stand Sit to Stand: +2 physical assistance;+2 safety/equipment;Mod assist Stand pivot transfers: Mod assist;+2 physical assistance;+2 safety/equipment       General transfer comment: Pt performed stand/pvt with chair brought up behind  Ambulation/Gait             General Gait Details: Stand pvt only to move from bed to chair  Stairs            Wheelchair Mobility    Modified Rankin  (Stroke Patients Only)       Balance Overall balance assessment: Needs assistance Sitting-balance support: No upper extremity supported;Feet supported Sitting balance-Leahy Scale: Good     Standing balance support: Bilateral upper extremity supported Standing balance-Leahy Scale: Poor                               Pertinent Vitals/Pain Pain Assessment: Faces Faces Pain Scale: Hurts little more Pain Location: abdomen Pain Descriptors / Indicators: Sore Pain Intervention(s): Limited activity within patient's tolerance;Monitored during session;Repositioned    Home Living Family/patient expects to be discharged to:: Private residence Living Arrangements: Alone Available Help at Discharge: Family Type of Home: House Home Access: Ramped entrance     Home Layout: One level Home Equipment: Other (comment) Printmaker) Additional Comments: has BSC and tub seat.      Prior Function Level of Independence: Independent with assistive device(s);Needs assistance   Gait / Transfers Assistance Needed: Pt states she does not walk but uses scooter including to bathroom  ADL's / Homemaking Assistance Needed: Pt reports she needs assist to complete ADL tasks  Comments: Pt states her dtr comes in multiple times daily to assist with meal, ADLs etc     Hand Dominance        Extremity/Trunk Assessment   Upper Extremity Assessment: Generalized weakness           Lower Extremity Assessment: Generalized weakness         Communication   Communication: No difficulties  Cognition Arousal/Alertness: Awake/alert Behavior During Therapy: WFL for tasks assessed/performed Overall  Cognitive Status: Within Functional Limits for tasks assessed                      General Comments      Exercises        Assessment/Plan    PT Assessment Patient needs continued PT services  PT Diagnosis Difficulty walking   PT Problem List Decreased strength;Decreased range of  motion;Decreased activity tolerance;Decreased balance;Decreased mobility;Decreased knowledge of use of DME;Obesity;Pain  PT Treatment Interventions DME instruction;Gait training;Functional mobility training;Therapeutic activities;Therapeutic exercise;Balance training;Patient/family education   PT Goals (Current goals can be found in the Care Plan section) Acute Rehab PT Goals Patient Stated Goal: did not state PT Goal Formulation: With patient Time For Goal Achievement: 12/15/15 Potential to Achieve Goals: Good    Frequency Min 3X/week   Barriers to discharge        Co-evaluation PT/OT/SLP Co-Evaluation/Treatment: Yes Reason for Co-Treatment: For patient/therapist safety PT goals addressed during session: Mobility/safety with mobility OT goals addressed during session: ADL's and self-care       End of Session   Activity Tolerance: Patient limited by fatigue;Patient limited by pain Patient left: in chair;with call bell/phone within reach;with chair alarm set Nurse Communication: Mobility status         Time: 8676-7209 PT Time Calculation (min) (ACUTE ONLY): 21 min   Charges:   PT Evaluation $PT Eval Low Complexity: 1 Procedure     PT G Codes:        Naketa Daddario 2015/12/14, 12:50 PM

## 2015-12-02 NOTE — Progress Notes (Addendum)
This shift pt refused p.m. Lab draw. RN educated pt on need for labs. Pt refused x2 occasions. Attending notified, attempt will be made in the a.m. Will continue to monitor. Pt has also refused morning labs, vitals and blood sugar. Became very combative with RN and NT. RN reminded pt why she is hospitalized and the need for vitals and lab work.. Agreed to allow for blood sugar and vitals.

## 2015-12-02 NOTE — Progress Notes (Signed)
[facility] PROGRESS NOTE  [name] UJW:[id number] DOB: 1956-05-26 DOA: 12/01/2015  PCP: Jackie Plum, MD  Brief HPI: This is a 60 year old African-American female with a past medical history of dementia, arthritis, stroke, diverticulosis, hypertension, presented with complaints of abdominal pain. She was found to have diverticulitis on June 12 and was sent home on Cipro and Flagyl. She presented with recurrent abdominal pain. Inflammation was noted around the sigmoid colon with the possibility of colo-vaginal fistula. Patient was hospitalized for further management  Past medical history:  Past Medical History  Diagnosis Date  . Arthritis   . Stroke (HCC)   . Coronary artery disease     a. remote hx of cath ~2000. B. cath  moderate disease in an RPL branch - too distal for PCI; 3rd RPLB lesion, 50% stenosed; hyperdynamic LV with near cavity obliteration & severely elevated LVEDP  . Asthma   . 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 Non-ischemic cardiomyopathy (HCC)     cxr - mild cardiomegly, Echo 10/07/14 - severe LVH with LVEF 65-70%.  . Hypertension   . Heart attack (HCC)   . Chronic bronchitis (HCC)   . Heart murmur   . Wheelchair bound   . Bilateral chronic knee pain   . Accelerated hypertension   . Dementia   . Diastolic CHF (HCC)     Consultants: Gen. surgery  Procedures: None  Antibiotics: Initially given oral vancomycin, which has been discontinued as of today. Cipro and Flagyl intravenously  Subjective: Patient is not very communicative. Unable to obtain much history from her. This is presumably due to her dementia.  Objective:  Vital Signs  Filed Vitals:   12/01/15 2150 12/02/15 0550 12/02/15 0559 12/02/15 1051  BP:   150/89 144/110  Pulse: 57  53   Temp: 98.5 F (36.9 C)  98.6 F (37 C)   TempSrc: Oral  Oral   Resp: 20  21   Weight:  121.1 kg (266 lb 15.6 oz)    SpO2: 100%   100%     Intake/Output Summary (Last 24 hours) at 12/02/15 1054 Last data filed at 12/02/15 1053  Gross per 24 hour  Intake   1250 ml  Output      0 ml  Net   1250 ml   Filed Weights   12/01/15 1634 12/02/15 0550  Weight: 120 kg (264 lb 8.8 oz) 121.1 kg (266 lb 15.6 oz)    General appearance: alert, appears stated age, distracted, no distress and moderately obese Resp: Diminished air entry at the bases. No crackles, wheezes Cardio: regular rate and rhythm, S1, S2 normal, no murmur, click, rub or gallop GI: Abdomen is obese. Nontender. No masses or organomegaly. Bowel sounds are present. Extremities: extremities normal, atraumatic, no cyanosis or edema Neurologic: Awake, alert. Distracted. Oriented to place. Does not know the year or month. Does not answer questions. No focal neurological deficits.  Lab Results:  Data Reviewed: I have personally reviewed following labs and imaging studies  CBC:  Recent Labs Lab 12/01/15 0705  WBC 14.2*  NEUTROABS 11.8*  HGB 13.4  HCT 40.9  MCV 77.2*  PLT 163   Basic Metabolic Panel:  Recent Labs Lab 12/01/15 0705  NA 143  K 4.4  CL 115*  CO2 22  GLUCOSE 114*  BUN 41*  CREATININE 2.70*  CALCIUM 8.5*   GFR: CrCl cannot be calculated (Unknown ideal weight.). Liver Function Tests:  Recent Labs Lab 12/01/15 0705  AST 23  ALT 15  ALKPHOS 56  BILITOT 0.6  PROT 7.1  ALBUMIN 3.6    Recent Labs Lab 12/01/15 0739  LIPASE 31    CBG:  Recent Labs Lab 12/01/15 1831 12/01/15 2026 12/02/15 0008 12/02/15 0604  GLUCAP 64* 76 90 82   Urine analysis:    Component Value Date/Time   COLORURINE YELLOW 12/01/2015 0638   APPEARANCEUR CLOUDY* 12/01/2015 0638   LABSPEC 1.016 12/01/2015 0638   PHURINE 5.0 12/01/2015 0638   GLUCOSEU NEGATIVE 12/01/2015 0638   HGBUR NEGATIVE 12/01/2015 0638   BILIRUBINUR NEGATIVE 12/01/2015 0638   KETONESUR NEGATIVE 12/01/2015 0638   PROTEINUR NEGATIVE 12/01/2015 0638   UROBILINOGEN  0.2 02/15/2015 1615   NITRITE NEGATIVE 12/01/2015 0638   LEUKOCYTESUR NEGATIVE 12/01/2015 1610    Recent Results (from the past 240 hour(s))  C difficile quick scan w PCR reflex     Status: None   Collection Time: 12/01/15  3:23 PM  Result Value Ref Range Status   C Diff antigen NEGATIVE NEGATIVE Final   C Diff toxin NEGATIVE NEGATIVE Final   C Diff interpretation Negative for toxigenic C. difficile  Final      Radiology Studies: Ct Abdomen Pelvis Wo Contrast  12/01/2015  CLINICAL DATA:  Mid to right abdominal pain and generalized weakness. Diarrhea. EXAM: CT ABDOMEN AND PELVIS WITHOUT CONTRAST TECHNIQUE: Multidetector CT imaging of the abdomen and pelvis was performed following the standard protocol without IV contrast. COMPARISON:  11/19/2015 CT abdomen/ pelvis. FINDINGS: Motion degraded study. Lower chest: Stable subsegmental scarring versus atelectasis in the left lower lobe. Stable trace pericardial fluid/ thickening. Hepatobiliary: Normal liver with no liver mass. Cholecystectomy. No biliary ductal dilatation. Pancreas: Normal, with no mass or duct dilation. Spleen: Normal size. No mass. Adrenals/Urinary Tract: Normal adrenals. No hydronephrosis. Vascular calcifications are noted in the renal sinuses, with no renal stones. No contour deforming renal mass. Normal bladder. Stomach/Bowel: Grossly normal stomach. Normal caliber small bowel with no small bowel wall thickening. Appendix not discretely visualized. No pericecal inflammatory changes. Mild to moderate sigmoid diverticulosis. No appreciable colonic wall thickening or new pericolonic fat stranding. Vascular/Lymphatic: Atherosclerotic nonaneurysmal abdominal aorta. No pathologically enlarged lymph nodes in the abdomen or pelvis. Reproductive: Status post hysterectomy. Loss of the normal fat plane between the vaginal cuff and proximal sigmoid colon with associated ill-defined fluid and gas in the vaginal fornix and surrounding mild fat  stranding, unchanged. No adnexal mass. Other: No pneumoperitoneum, ascites or focal fluid collection. Musculoskeletal: No aggressive appearing focal osseous lesions. Severe degenerative changes in the visualized thoracolumbar spine. IMPRESSION: 1. Mild to moderate sigmoid diverticulosis. Loss of the normal fat plane between the vaginal cuff and proximal sigmoid colon with associated ill-defined surrounding fat stranding and fluid and gas in the vaginal fornix. Colovaginal fistula cannot be excluded. 2. No focal drainable fluid collections. No evidence of bowel obstruction. Electronically Signed   By: Delbert Phenix M.D.   On: 12/01/2015 10:00     Medications:  Scheduled: . aspirin EC  81 mg Oral Daily  . atorvastatin  20 mg Oral q1800  . ciprofloxacin  400 mg Intravenous Q12H  . donepezil  5 mg Oral QHS  . heparin  5,000 Units Subcutaneous Q8H  . hydrOXYzine  25 mg Oral QHS  . metoprolol succinate  50 mg Oral Daily  . metronidazole  500 mg Intravenous Q8H  . nicotine  7 mg Transdermal Daily  . QUEtiapine  12.5 mg Oral QHS  Continuous: . sodium chloride 100 mL/hr at 12/02/15 4098   JXB:JYNWGNFAO, baclofen, hydrALAZINE, HYDROcodone-acetaminophen, ondansetron **OR** ondansetron (ZOFRAN) IV  Assessment/Plan:  Active Problems:   Cigarette smoker   CAD (coronary artery disease)   Hypertensive heart disease   HLD (hyperlipidemia)   AKI (acute kidney injury) (HCC)   Diarrhea   Colovaginal fistula   Dementia   Insomnia   Chronic pain   Chronic diastolic heart failure (HCC)     Abdominal pain likely secondary to colovaginal fistula from chronic diverticulosis Patient was hospitalized. She was placed initially on intravenous Flagyl and oral vancomycin. Due to concern for C. difficile. General surgery was consulted. C. difficile PCR is negative. Will initiate intravenous Cipro and stop the oral vancomycin. Gen. surgery spoke to the patient about doing a colostomy to allow that area of  colovaginal fistula to heal. Patient has declined this procedure. At this time continue with antibiotics. Patient has been refusing blood draws. Discussed in detail with the patient this morning. Dementia could be contributing. We will give her a dose of Haldol. Reattempt blood work later today.  Acute kidney injury  Cr 2.7 w/ BUN 41, at the time of admission. Patient was started on IV fluids. Her ARB has been held. Patient has refused blood work this morning. She has been told the importance of checking her renal function. Monitor urine output.  Dementia Patient is followed by neurology. Continue Aricept and Seroquel. Dementia is contributing to patient's refusal of blood draws. Give her 1 dose of Haldol to see if it helps. Reorient daily. PT evaluation.  Hyperlipidemia  Continue statin  Essential hypertension  Continue home medications including metoprolol. Holding her olmesartan due to AKI  Chronic pain Continue baclofen, norco  Insomnia Continue vistaril  Chronic diastolic congestive heart failure Last echo from September 2016 showing EF of 55% but diastolic dysfunction. Trace LE edema on admission. Continue beta blocker. Appears to be well compensated. No diuretics due to renal failure.  H/o MI/CAD/CVA Continue aspirin and statin. Stable.  Psychosocial Patient apparently lives at home with one daughter is the primary intermittent caregiver. Patient with dementia and apparently has been unable to properly care for herself. Social worker and case management has been consulted. PT evaluation. May need placement.  DVT Prophylaxis: Subcutaneous heparin    Code Status: Full code  Family Communication: Discussed with the patient. Attempted to call her daughters Britta Mccreedy and Amy with no success. Disposition Plan: Continue management as outlined above. Mobilize as tolerated.    LOS: 1 day   [facility]  [facility] Pager [phone] 12/02/2015, 10:54 AM  If 7PM-7AM, please  contact night-coverage at [email or web address], password [facility]

## 2015-12-03 ENCOUNTER — Encounter (HOSPITAL_COMMUNITY): Payer: Self-pay

## 2015-12-03 LAB — BASIC METABOLIC PANEL
ANION GAP: 7 (ref 5–15)
BUN: 31 mg/dL — AB (ref 6–20)
CHLORIDE: 113 mmol/L — AB (ref 101–111)
CO2: 22 mmol/L (ref 22–32)
Calcium: 8.6 mg/dL — ABNORMAL LOW (ref 8.9–10.3)
Creatinine, Ser: 1.8 mg/dL — ABNORMAL HIGH (ref 0.44–1.00)
GFR calc Af Amer: 34 mL/min — ABNORMAL LOW (ref >60)
GFR, EST NON AFRICAN AMERICAN: 29 mL/min — AB (ref >60)
GLUCOSE: 123 mg/dL — AB (ref 65–99)
POTASSIUM: 3.3 mmol/L — AB (ref 3.5–5.1)
Sodium: 142 mmol/L (ref 135–145)

## 2015-12-03 LAB — GLUCOSE, CAPILLARY
GLUCOSE-CAPILLARY: 100 mg/dL — AB (ref 65–99)
GLUCOSE-CAPILLARY: 99 mg/dL (ref 65–99)
Glucose-Capillary: 111 mg/dL — ABNORMAL HIGH (ref 65–99)

## 2015-12-03 MED ORDER — LOPERAMIDE HCL 2 MG PO CAPS
2.0000 mg | ORAL_CAPSULE | Freq: Once | ORAL | Status: AC
  Administered 2015-12-03: 2 mg via ORAL
  Filled 2015-12-03: qty 1

## 2015-12-03 MED ORDER — POTASSIUM CHLORIDE CRYS ER 20 MEQ PO TBCR
20.0000 meq | EXTENDED_RELEASE_TABLET | Freq: Once | ORAL | Status: AC
  Administered 2015-12-03: 20 meq via ORAL
  Filled 2015-12-03: qty 1

## 2015-12-03 MED ORDER — AMLODIPINE BESYLATE 5 MG PO TABS
5.0000 mg | ORAL_TABLET | Freq: Every day | ORAL | Status: DC
  Administered 2015-12-03 – 2015-12-04 (×2): 5 mg via ORAL
  Filled 2015-12-03 (×2): qty 1

## 2015-12-03 MED ORDER — METRONIDAZOLE 500 MG PO TABS
500.0000 mg | ORAL_TABLET | Freq: Three times a day (TID) | ORAL | Status: DC
  Administered 2015-12-03 – 2015-12-07 (×13): 500 mg via ORAL
  Filled 2015-12-03 (×13): qty 1

## 2015-12-03 MED ORDER — CIPROFLOXACIN HCL 500 MG PO TABS
500.0000 mg | ORAL_TABLET | Freq: Two times a day (BID) | ORAL | Status: DC
  Administered 2015-12-03 – 2015-12-07 (×8): 500 mg via ORAL
  Filled 2015-12-03 (×8): qty 1

## 2015-12-03 NOTE — [place] FL2 (Deleted)
[place] MEDICAID FL2 LEVEL OF CARE SCREENING TOOL     IDENTIFICATION  Patient Name: [name] Birthdate: Jul 14, 1955 Sex: female Admission Date (Current Location): 12/01/2015  [facility] and [place] Number:  Producer, television/film/video and Address:  [facility],  [address] [place] [address]      Provider Number: [id number]  Attending Physician Name and Address:  Osvaldo Shipper, MD  Relative Name and Phone Number:  [phone]    Current Level of Care: Hospital Recommended Level of Care: Skilled Nursing Facility Prior Approval Number:    Date Approved/Denied:   PASRR Number:    Discharge Plan: SNF    Current Diagnoses: Patient Active Problem List   Diagnosis Date Noted  . Diarrhea 12/01/2015  . 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 Dementia 12/01/2015  . Insomnia 12/01/2015  . Chronic pain 12/01/2015  . Chronic diastolic heart failure (HCC) 12/01/2015  . AP (abdominal pain)   . Moderate dementia with behavioral disturbance 08/01/2015  . Visual hallucinations 08/01/2015  . HCAP (healthcare-associated pneumonia) 03/20/2015  . Edema   . LVH (left ventricular hypertrophy)   . Chest pain 02/19/2015  . Leg pain 02/18/2015  . Elevated troponin 02/18/2015  . Hypertension 02/18/2015  . AKI (acute kidney injury) (HCC) 02/18/2015  . Knee pain, acute   . Chronic respiratory failure- on home O2 02/17/2015  . Weakness 02/15/2015  . Hypertensive urgency 02/15/2015  . Hypertensive heart disease 11/08/2014  . HLD (hyperlipidemia) 11/08/2014  . Diverticular disease of large intestine   . Coronary artery disease   . Stroke (HCC)   . Non-ischemic cardiomyopathy (HCC)   . ACS (acute coronary syndrome) (HCC) 10/07/2014  . CAD (coronary artery disease)   . Hematochezia 04/28/2012  . Hypokalemia 04/28/2012  . Cigarette smoker 06/30/2006  . Morbid obesity (HCC) 03/26/2006  . CARDIOMYOPATHY 03/26/2006  . COPD pending/ still smoking  03/26/2006  . GERD  03/26/2006  . Osteoarthritis 03/26/2006    Orientation RESPIRATION BLADDER Height & Weight     Self, Situation, Place  Normal Continent Weight: 274 lb 7.6 oz (124.5 kg) Height:     BEHAVIORAL SYMPTOMS/MOOD NEUROLOGICAL BOWEL NUTRITION STATUS      Continent Diet (Soft diet)  AMBULATORY STATUS COMMUNICATION OF NEEDS Skin   Limited Assist Verbally Normal                       Personal Care Assistance Level of Assistance  Bathing, Feeding, Dressing Bathing Assistance: Limited assistance Feeding assistance: Limited assistance Dressing Assistance: Limited assistance     Functional Limitations Info  Sight, Hearing, Speech Sight Info: Adequate Hearing Info: Adequate Speech Info: Adequate    SPECIAL CARE FACTORS FREQUENCY  PT (By licensed PT)     PT Frequency: 5              Contractures Contractures Info: Not present    Additional Factors Info  Code Status, Allergies, Psychotropic Code Status Info: Full Code Allergies Info: Penicillins Psychotropic Info: yes         Current Medications (12/03/2015):  This is the current hospital active medication list Current Facility-Administered Medications  Medication Dose Route Frequency Provider Last Rate Last Dose  . 0.9 %  sodium chloride infusion   Intravenous Continuous Osvaldo Shipper, MD 75 mL/hr at 12/03/15 1123    . albuterol (PROVENTIL) (2.5 MG/3ML) 0.083% nebulizer solution 2.5 mg  2.5 mg Nebulization Q4H PRN Ozella Rocks, MD      . amLODipine [facility])  tablet 5 mg  5 mg Oral Daily Osvaldo Shipper, MD   5 mg at 12/03/15 1337  . aspirin EC tablet 81 mg  81 mg Oral Daily Ozella Rocks, MD   81 mg at 12/03/15 1100  . atorvastatin (LIPITOR) tablet 20 mg  20 mg Oral q1800 Ozella Rocks, MD   20 mg at 12/02/15 1736  . baclofen (LIORESAL) tablet 10 mg  10 mg Oral TID PRN Ozella Rocks, MD      . ciprofloxacin (CIPRO) tablet 500 mg  500 mg Oral BID Osvaldo Shipper, MD      . donepezil (ARICEPT) tablet 5 mg  5 mg  Oral QHS Ozella Rocks, MD   5 mg at 12/02/15 2238  . heparin injection 5,000 Units  5,000 Units Subcutaneous Q8H Ozella Rocks, MD   5,000 Units at 12/03/15 1453  . hydrALAZINE (APRESOLINE) injection 5-10 mg  5-10 mg Intravenous Q4H PRN Ozella Rocks, MD   10 mg at 12/01/15 2303  . HYDROcodone-acetaminophen (NORCO/VICODIN) 5-325 MG per tablet 1 tablet  1 tablet Oral Q8H PRN Ozella Rocks, MD      . hydrOXYzine (ATARAX/VISTARIL) tablet 25 mg  25 mg Oral QHS Rolan Bucco, MD   25 mg at 12/02/15 2238  . metoprolol succinate (TOPROL-XL) 24 hr tablet 50 mg  50 mg Oral Daily Ozella Rocks, MD   50 mg at 12/03/15 1100  . metroNIDAZOLE (FLAGYL) tablet 500 mg  500 mg Oral Q8H Osvaldo Shipper, MD   500 mg at 12/03/15 1453  . nicotine (NICODERM CQ - dosed in mg/24 hr) patch 7 mg  7 mg Transdermal Daily Ozella Rocks, MD   7 mg at 12/03/15 1100  . ondansetron (ZOFRAN) tablet 4 mg  4 mg Oral Q6H PRN Ozella Rocks, MD       Or  . ondansetron [facility]) injection 4 mg  4 mg Intravenous Q6H PRN Ozella Rocks, MD      . QUEtiapine (SEROQUEL) tablet 12.5 mg  12.5 mg Oral QHS Ozella Rocks, MD   12.5 mg at 12/02/15 2238     Discharge Medications: Please see discharge summary for a list of discharge medications.  Relevant Imaging Results:  Relevant Lab Results:   Additional Information XB#[id number]  Clearance Coots, LCSW

## 2015-12-03 NOTE — Progress Notes (Signed)
LCSWA spoke with daughter about SNF choices, Daughter wants to review them with patient before making a decision.   [name], [name], MSW Clinical Social Worker 5E and Psychiatric Service Line [phone] 12/03/2015  5:37 PM

## 2015-12-03 NOTE — Progress Notes (Signed)
Pt refused repeatedly to allow [place] to check her CBG. Both the tech and myself tried repeatedly with the patient's response being "get on out of here". Doctor was notified of patient's refusal. Will continue to monitor patient.

## 2015-12-03 NOTE — Progress Notes (Signed)
Occupational Therapy Treatment Patient Details Name: [name] MRN: [id number] DOB: 04-16-1956 Today's Date: 12/03/2015    History of present illness This is a 60 year old African-American female with a past medical history of dementia, arthritis, stroke, diverticulosis, hypertension, presented with complaints of abdominal pain. She was found to have diverticulitis on June 12 and was sent home on Cipro and Flagyl. She presented with recurrent abdominal pain. Inflammation was noted around the sigmoid colon with the possibility of colo-vaginal fistula   OT comments  Pt agreed to sit to stand but fatigued quickly!   Follow Up Recommendations  SNF    Equipment Recommendations  None recommended by OT    Recommendations for Other Services      Precautions / Restrictions Precautions Precautions: Fall Restrictions Weight Bearing Restrictions: No       Mobility Bed Mobility               General bed mobility comments: pt in chair  Transfers Overall transfer level: Needs assistance Equipment used: Rolling walker (2 wheeled) Transfers: Sit to/from Stand Sit to Stand: Max assist         General transfer comment: pt performed multiple sit to stand's with OT focusing on pushing up with BUE        ADL Overall ADL's : Needs assistance/impaired     Grooming: Sitting;Set up;Wash/dry face;Wash/dry hands               Lower Body Dressing: Maximal assistance;Sit to/from stand;Cueing for sequencing;Cueing for compensatory techniques;Cueing for safety       Toileting- Clothing Manipulation and Hygiene: Sit to/from stand;Cueing for sequencing;Cueing for safety;Maximal assistance                          Cognition   Behavior During Therapy: [facility] for tasks assessed/performed Overall Cognitive Status: Within Functional Limits for tasks assessed                       Extremity/Trunk Assessment                          Pertinent  Vitals/ Pain       Pain Score: 2  Pain Location: abdomen Pain Descriptors / Indicators: Sore Pain Intervention(s): Monitored during session;Repositioned  Home Living Family/patient expects to be discharged to:: Private residence Living Arrangements: Alone                                             Progress Toward Goals  OT Goals(current goals can now be found in the care plan section)  Progress towards OT goals: Progressing toward goals     Plan Discharge plan remains appropriate    Co-evaluation                 End of Session     Activity Tolerance Patient limited by fatigue   Patient Left in chair;with call bell/phone within reach;with chair alarm set   Nurse Communication Mobility status        Time: 0962-8366 OT Time Calculation (min): 15 min  Charges: OT General Charges $OT Visit: 1 Procedure OT Treatments $Self Care/Home Management : 8-22 mins  Taha Dimond, [place] Kung 12/03/2015, 12:23 PM

## 2015-12-03 NOTE — Progress Notes (Signed)
[facility] PROGRESS NOTE  [name] ERD:[id number] DOB: 05/25/1956 DOA: 12/01/2015  PCP: Jackie Plum, MD  Brief HPI: This is a 60 year old African-American female with a past medical history of dementia, arthritis, stroke, diverticulosis, hypertension, presented with complaints of abdominal pain. She was found to have diverticulitis on June 12 and was sent home on Cipro and Flagyl. She presented with recurrent abdominal pain. Inflammation was noted around the sigmoid colon with the possibility of colo-vaginal fistula. Patient was hospitalized for further management  Past medical history:  Past Medical History  Diagnosis Date  . Arthritis   . Stroke (HCC)   . Coronary artery disease     a. remote hx of cath ~2000. B. cath  moderate disease in an RPL branch - too distal for PCI; 3rd RPLB lesion, 50% stenosed; hyperdynamic LV with near cavity obliteration & severely elevated LVEDP  . Asthma   . 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 Non-ischemic cardiomyopathy (HCC)     cxr - mild cardiomegly, Echo 10/07/14 - severe LVH with LVEF 65-70%.  . Hypertension   . Heart attack (HCC)   . Chronic bronchitis (HCC)   . Heart murmur   . Wheelchair bound   . Bilateral chronic knee pain   . Accelerated hypertension   . Dementia   . Diastolic CHF (HCC)     Consultants: Gen. surgery  Procedures: None  Antibiotics: Initially given oral vancomycin, which has been discontinued as of 6/25. Cipro and Flagyl  Subjective: Patient is sitting up on the chair. States that she is feeling better. Denies any nausea, vomiting. Abdominal pain is improved. Remains taciturn.   Objective:  Vital Signs  Filed Vitals:   12/02/15 1352 12/02/15 2330 12/03/15 0530 12/03/15 1000  BP: 112/79  190/84 194/75  Pulse: 80 59 56 54  Temp: 98.1 F (36.7 C) 98 F (36.7 C) 98.6 F (37 C) 98.7 F (37.1 C)  TempSrc: Oral Oral Oral Oral    Resp: 20 19 20 20   Weight:   124.5 kg (274 lb 7.6 oz)   SpO2: 100% 100% 100% 98%    Intake/Output Summary (Last 24 hours) at 12/03/15 1106 Last data filed at 12/03/15 0745  Gross per 24 hour  Intake    360 ml  Output      0 ml  Net    360 ml   Filed Weights   12/01/15 1634 12/02/15 0550 12/03/15 0530  Weight: 120 kg (264 lb 8.8 oz) 121.1 kg (266 lb 15.6 oz) 124.5 kg (274 lb 7.6 oz)    General appearance: alert, appears stated age, distracted, no distress and moderately obese Resp: Improved air entry bilaterally. No crackles or wheezing.  Cardio: regular rate and rhythm, S1, S2 normal, no murmur, click, rub or gallop GI: Abdomen is obese. Nontender. No masses or organomegaly. Bowel sounds are present. Extremities: extremities normal, atraumatic, no cyanosis or edema Neurologic: Awake, alert. Distracted. Oriented to place. Does not know the year or month. Does not answer questions. No focal neurological deficits.  Lab Results:  Data Reviewed: I have personally reviewed following labs and imaging studies  CBC:  Recent Labs Lab 12/01/15 0705 12/02/15 1135  WBC 14.2* 6.1  NEUTROABS 11.8*  --   HGB 13.4 13.4  HCT 40.9 40.3  MCV 77.2* 76.3*  PLT 163 147*   Basic Metabolic Panel:  Recent Labs Lab 12/01/15 0705 12/02/15 1135 12/03/15 0945  NA 143  141 142  K 4.4 3.8 3.3*  CL 115* 113* 113*  CO2 22 22 22   GLUCOSE 114* 131* 123*  BUN 41* 28* 31*  CREATININE 2.70* 1.95* 1.80*  CALCIUM 8.5* 8.4* 8.6*   GFR: CrCl cannot be calculated (Unknown ideal weight.). Liver Function Tests:  Recent Labs Lab 12/01/15 0705 12/02/15 1135  AST 23 21  ALT 15 13*  ALKPHOS 56 49  BILITOT 0.6 0.8  PROT 7.1 6.6  ALBUMIN 3.6 3.3*    Recent Labs Lab 12/01/15 0739  LIPASE 31    CBG:  Recent Labs Lab 12/02/15 0604 12/02/15 1133 12/02/15 1746 12/03/15 0056 12/03/15 0613  GLUCAP 82 129* 117* 100* 99   Urine analysis:    Component Value Date/Time   COLORURINE  YELLOW 12/01/2015 0638   APPEARANCEUR CLOUDY* 12/01/2015 0638   LABSPEC 1.016 12/01/2015 0638   PHURINE 5.0 12/01/2015 0638   GLUCOSEU NEGATIVE 12/01/2015 0638   HGBUR NEGATIVE 12/01/2015 0638   BILIRUBINUR NEGATIVE 12/01/2015 0638   KETONESUR NEGATIVE 12/01/2015 0638   PROTEINUR NEGATIVE 12/01/2015 0638   UROBILINOGEN 0.2 02/15/2015 1615   NITRITE NEGATIVE 12/01/2015 0638   LEUKOCYTESUR NEGATIVE 12/01/2015 1610    Recent Results (from the past 240 hour(s))  Culture, blood (routine x 2)     Status: None (Preliminary result)   Collection Time: 12/01/15 12:55 PM  Result Value Ref Range Status   Specimen Description BLOOD LEFT ANTECUBITAL  Final   Special Requests BOTTLES DRAWN AEROBIC AND ANAEROBIC 5 CC EACH  Final   Culture   Final    NO GROWTH 1 DAY Performed at [facility]    Report Status PENDING  Incomplete  C difficile quick scan w PCR reflex     Status: None   Collection Time: 12/01/15  3:23 PM  Result Value Ref Range Status   C Diff antigen NEGATIVE NEGATIVE Final   C Diff toxin NEGATIVE NEGATIVE Final   C Diff interpretation Negative for toxigenic C. difficile  Final  Gastrointestinal Panel by PCR , Stool     Status: None   Collection Time: 12/01/15  3:23 PM  Result Value Ref Range Status   Campylobacter species NOT DETECTED NOT DETECTED Final   Plesimonas shigelloides NOT DETECTED NOT DETECTED Final   Salmonella species NOT DETECTED NOT DETECTED Final   Yersinia enterocolitica NOT DETECTED NOT DETECTED Final   Vibrio species NOT DETECTED NOT DETECTED Final   Vibrio cholerae NOT DETECTED NOT DETECTED Final   Enteroaggregative E coli (EAEC) NOT DETECTED NOT DETECTED Final   Enteropathogenic E coli (EPEC) NOT DETECTED NOT DETECTED Final   Enterotoxigenic E coli (ETEC) NOT DETECTED NOT DETECTED Final   Shiga like toxin producing E coli (STEC) NOT DETECTED NOT DETECTED Final   E. coli O157 NOT DETECTED NOT DETECTED Final   Shigella/Enteroinvasive E coli (EIEC)  NOT DETECTED NOT DETECTED Final   Cryptosporidium NOT DETECTED NOT DETECTED Final   Cyclospora cayetanensis NOT DETECTED NOT DETECTED Final   Entamoeba histolytica NOT DETECTED NOT DETECTED Final   Giardia lamblia NOT DETECTED NOT DETECTED Final   Adenovirus F40/41 NOT DETECTED NOT DETECTED Final   Astrovirus NOT DETECTED NOT DETECTED Final   Norovirus GI/GII NOT DETECTED NOT DETECTED Final   Rotavirus A NOT DETECTED NOT DETECTED Final   Sapovirus (I, II, IV, and V) NOT DETECTED NOT DETECTED Final      Radiology Studies: No results found.   Medications:  Scheduled: . amLODipine  5 mg Oral Daily  .  aspirin EC  81 mg Oral Daily  . atorvastatin  20 mg Oral q1800  . ciprofloxacin  500 mg Oral BID  . donepezil  5 mg Oral QHS  . heparin  5,000 Units Subcutaneous Q8H  . hydrOXYzine  25 mg Oral QHS  . metoprolol succinate  50 mg Oral Daily  . metroNIDAZOLE  500 mg Oral Q8H  . nicotine  7 mg Transdermal Daily  . potassium chloride  20 mEq Oral Once  . QUEtiapine  12.5 mg Oral QHS   Continuous: . sodium chloride 75 mL/hr at 12/03/15 1058   ZOX:WRUEAVWUJ, baclofen, hydrALAZINE, HYDROcodone-acetaminophen, ondansetron **OR** ondansetron (ZOFRAN) IV  Assessment/Plan:  Active Problems:   Cigarette smoker   CAD (coronary artery disease)   Hypertensive heart disease   HLD (hyperlipidemia)   AKI (acute kidney injury) (HCC)   Diarrhea   Colovaginal fistula   Dementia   Insomnia   Chronic pain   Chronic diastolic heart failure (HCC)     Abdominal pain likely secondary to colovaginal fistula from chronic diverticulosis Patient was hospitalized. She was placed initially on intravenous Flagyl and oral vancomycin due to concern for C. difficile. General surgery was consulted. C. difficile PCR is negative. Patient was subsequently started on Cipro and Flagyl. Oral vancomycin was stopped. Gen. surgery spoke to the patient about doing a colostomy to allow that area of colovaginal fistula  to heal. Patient has declined this procedure. Medical management will be pursued for now. Change to oral antibiotics today. Outpatient follow-up with general surgery if patient desires further management of the fistula. This is not an emergent situation.  Acute kidney injury  Patient's BUN and creatinine are improving. Continue IV fluids. Monitor urine output. Patient declines blood work, usually early in the morning. She is agreeable to blood work later in the day. Replace Potassium.  Dementia Patient is followed by neurology. Continue Aricept and Seroquel. Patient's behavior is stable. However she seems to lack capacity to be able to take appropriate decisions by herself.  Hyperlipidemia  Continue statin  Essential hypertension  Blood pressure is poorly controlled this morning. Continue with metoprolol. Due to her renal failure. The ARB cannot be continued at this time. We will place her on amlodipine.   Chronic pain Continue baclofen, norco  Insomnia Continue vistaril  Chronic diastolic congestive heart failure Last echo from September 2016 showing EF of 55% but diastolic dysfunction. Trace LE edema on admission. Continue beta blocker. Appears to be well compensated. No diuretics due to renal failure.  H/o MI/CAD/CVA Continue aspirin and statin. Stable.  Psychosocial Patient apparently lives at home with one daughter is the primary intermittent caregiver. Patient with dementia and apparently has been unable to properly care for herself. Social worker and case management has been consulted. Evaluated by physical therapy. She will need placement to skilled nursing facility. She lacks capacity at this time.  DVT Prophylaxis: Subcutaneous heparin    Code Status: Full code  Family Communication: Discussed with the patient. Discussed with one of the daughters yesterday at [phone]. Disposition Plan: Change to oral antibiotics today. Continue IV fluids. Repeat labs tomorrow morning.  Anticipate discharge to SNF in 1-2 days.    LOS: 2 days   [facility]  [facility] Pager [phone] 12/03/2015, 11:06 AM  If 7PM-7AM, please contact night-coverage at [email or web address], password [facility]

## 2015-12-04 LAB — BASIC METABOLIC PANEL
ANION GAP: 7 (ref 5–15)
BUN: 26 mg/dL — ABNORMAL HIGH (ref 6–20)
CHLORIDE: 114 mmol/L — AB (ref 101–111)
CO2: 22 mmol/L (ref 22–32)
Calcium: 8.7 mg/dL — ABNORMAL LOW (ref 8.9–10.3)
Creatinine, Ser: 1.6 mg/dL — ABNORMAL HIGH (ref 0.44–1.00)
GFR calc non Af Amer: 34 mL/min — ABNORMAL LOW (ref >60)
GFR, EST AFRICAN AMERICAN: 39 mL/min — AB (ref >60)
Glucose, Bld: 162 mg/dL — ABNORMAL HIGH (ref 65–99)
POTASSIUM: 3.6 mmol/L (ref 3.5–5.1)
Sodium: 143 mmol/L (ref 135–145)

## 2015-12-04 LAB — GLUCOSE, CAPILLARY
GLUCOSE-CAPILLARY: 146 mg/dL — AB (ref 65–99)
GLUCOSE-CAPILLARY: 110 mg/dL — AB (ref 65–99)
Glucose-Capillary: 86 mg/dL (ref 65–99)

## 2015-12-04 MED ORDER — AMLODIPINE BESYLATE 10 MG PO TABS
10.0000 mg | ORAL_TABLET | Freq: Every day | ORAL | Status: DC
  Administered 2015-12-05 – 2015-12-12 (×8): 10 mg via ORAL
  Filled 2015-12-04 (×8): qty 1

## 2015-12-04 MED ORDER — VITAMINS A & D EX OINT
TOPICAL_OINTMENT | CUTANEOUS | Status: AC
  Administered 2015-12-04: 1
  Filled 2015-12-04: qty 5

## 2015-12-04 MED ORDER — METOPROLOL SUCCINATE ER 50 MG PO TB24
100.0000 mg | ORAL_TABLET | Freq: Every day | ORAL | Status: DC
  Administered 2015-12-05 – 2015-12-12 (×7): 100 mg via ORAL
  Filled 2015-12-04 (×8): qty 2

## 2015-12-04 NOTE — Progress Notes (Signed)
[facility] PROGRESS NOTE  [name] QVZ:[id number] DOB: 1955/09/28 DOA: 12/01/2015  PCP: Jackie Plum, MD  Brief HPI: This is a 60 year old African-American female with a past medical history of dementia, arthritis, stroke, diverticulosis, hypertension, presented with complaints of abdominal pain. She was found to have diverticulitis on June 12 and was sent home on Cipro and Flagyl. She presented with recurrent abdominal pain. Inflammation was noted around the sigmoid colon with the possibility of colo-vaginal fistula. Patient was hospitalized for further management  Past medical history:  Past Medical History  Diagnosis Date  . Arthritis   . Stroke (HCC)   . Coronary artery disease     a. remote hx of cath ~2000. B. cath  moderate disease in an RPL branch - too distal for PCI; 3rd RPLB lesion, 50% stenosed; hyperdynamic LV with near cavity obliteration & severely elevated LVEDP  . Asthma   . 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 Non-ischemic cardiomyopathy (HCC)     cxr - mild cardiomegly, Echo 10/07/14 - severe LVH with LVEF 65-70%.  . Hypertension   . Heart attack (HCC)   . Chronic bronchitis (HCC)   . Heart murmur   . Wheelchair bound   . Bilateral chronic knee pain   . Accelerated hypertension   . Dementia   . Diastolic CHF (HCC)     Consultants: Gen. surgery  Procedures: None  Antibiotics: Initially given oral vancomycin, which has been discontinued as of 6/25. Cipro and Flagyl  Subjective: Patient is sitting up on the chair. States that she is feeling better. Denies any nausea, vomiting. Abdominal pain is improved. Remains taciturn.   Objective:  Vital Signs  Filed Vitals:   12/04/15 0428 12/04/15 0626 12/04/15 1000 12/04/15 1353  BP:   180/68 174/85  Pulse:  56 58 54  Temp:  98.3 F (36.8 C) 98.3 F (36.8 C) 98.6 F (37 C)  TempSrc:  Oral Oral Oral  Resp:  19 22 20   Weight:  124.8 kg (275 lb 2.2 oz)     SpO2:  100% 99% 100%    Intake/Output Summary (Last 24 hours) at 12/04/15 1449 Last data filed at 12/04/15 1312  Gross per 24 hour  Intake    900 ml  Output      0 ml  Net    900 ml   Filed Weights   12/02/15 0550 12/03/15 0530 12/04/15 0428  Weight: 121.1 kg (266 lb 15.6 oz) 124.5 kg (274 lb 7.6 oz) 124.8 kg (275 lb 2.2 oz)    General appearance: alert, appears stated age, distracted, no distress and moderately obese Resp: Improved air entry bilaterally. No crackles or wheezing.  Cardio: regular rate and rhythm, S1, S2 normal, no murmur, click, rub or gallop GI: Abdomen is obese. Nontender. No masses or organomegaly. Bowel sounds are present. Extremities: extremities normal, atraumatic, no cyanosis or edema Neurologic: Awake, alert. Moves all limbs  Lab Results:  Data Reviewed: I have personally reviewed following labs and imaging studies  CBC:  Recent Labs Lab 12/01/15 0705 12/02/15 1135  WBC 14.2* 6.1  NEUTROABS 11.8*  --   HGB 13.4 13.4  HCT 40.9 40.3  MCV 77.2* 76.3*  PLT 163 147*   Basic Metabolic Panel:  Recent Labs Lab 12/01/15 0705 12/02/15 1135 12/03/15 0945 12/04/15 0937  NA 143 141 142 143  K 4.4 3.8 3.3* 3.6  CL 115* 113* 113* 114*  CO2 22  22 22 22   GLUCOSE 114* 131* 123* 162*  BUN 41* 28* 31* 26*  CREATININE 2.70* 1.95* 1.80* 1.60*  CALCIUM 8.5* 8.4* 8.6* 8.7*   GFR: CrCl cannot be calculated (Unknown ideal weight.). Liver Function Tests:  Recent Labs Lab 12/01/15 0705 12/02/15 1135  AST 23 21  ALT 15 13*  ALKPHOS 56 49  BILITOT 0.6 0.8  PROT 7.1 6.6  ALBUMIN 3.6 3.3*    Recent Labs Lab 12/01/15 0739  LIPASE 31    CBG:  Recent Labs Lab 12/03/15 0056 12/03/15 0613 12/03/15 1153 12/04/15 0645 12/04/15 1211  GLUCAP 100* 99 111* 86 146*   Urine analysis:    Component Value Date/Time   COLORURINE YELLOW 12/01/2015 0638   APPEARANCEUR CLOUDY* 12/01/2015 0638   LABSPEC 1.016 12/01/2015  0638   PHURINE 5.0 12/01/2015 0638   GLUCOSEU NEGATIVE 12/01/2015 0638   HGBUR NEGATIVE 12/01/2015 0638   BILIRUBINUR NEGATIVE 12/01/2015 0638   KETONESUR NEGATIVE 12/01/2015 0638   PROTEINUR NEGATIVE 12/01/2015 0638   UROBILINOGEN 0.2 02/15/2015 1615   NITRITE NEGATIVE 12/01/2015 0638   LEUKOCYTESUR NEGATIVE 12/01/2015 1610    Recent Results (from the past 240 hour(s))  Culture, blood (routine x 2)     Status: None (Preliminary result)   Collection Time: 12/01/15 12:55 PM  Result Value Ref Range Status   Specimen Description BLOOD LEFT ANTECUBITAL  Final   Special Requests BOTTLES DRAWN AEROBIC AND ANAEROBIC 5 CC EACH  Final   Culture   Final    NO GROWTH 2 DAYS Performed at [facility]    Report Status PENDING  Incomplete  C difficile quick scan w PCR reflex     Status: None   Collection Time: 12/01/15  3:23 PM  Result Value Ref Range Status   C Diff antigen NEGATIVE NEGATIVE Final   C Diff toxin NEGATIVE NEGATIVE Final   C Diff interpretation Negative for toxigenic C. difficile  Final  Gastrointestinal Panel by PCR , Stool     Status: None   Collection Time: 12/01/15  3:23 PM  Result Value Ref Range Status   Campylobacter species NOT DETECTED NOT DETECTED Final   Plesimonas shigelloides NOT DETECTED NOT DETECTED Final   Salmonella species NOT DETECTED NOT DETECTED Final   Yersinia enterocolitica NOT DETECTED NOT DETECTED Final   Vibrio species NOT DETECTED NOT DETECTED Final   Vibrio cholerae NOT DETECTED NOT DETECTED Final   Enteroaggregative E coli (EAEC) NOT DETECTED NOT DETECTED Final   Enteropathogenic E coli (EPEC) NOT DETECTED NOT DETECTED Final   Enterotoxigenic E coli (ETEC) NOT DETECTED NOT DETECTED Final   Shiga like toxin producing E coli (STEC) NOT DETECTED NOT DETECTED Final   E. coli O157 NOT DETECTED NOT DETECTED Final   Shigella/Enteroinvasive E coli (EIEC) NOT DETECTED NOT DETECTED Final   Cryptosporidium NOT DETECTED NOT DETECTED Final    Cyclospora cayetanensis NOT DETECTED NOT DETECTED Final   Entamoeba histolytica NOT DETECTED NOT DETECTED Final   Giardia lamblia NOT DETECTED NOT DETECTED Final   Adenovirus F40/41 NOT DETECTED NOT DETECTED Final   Astrovirus NOT DETECTED NOT DETECTED Final   Norovirus GI/GII NOT DETECTED NOT DETECTED Final   Rotavirus A NOT DETECTED NOT DETECTED Final   Sapovirus (I, II, IV, and V) NOT DETECTED NOT DETECTED Final  Culture, blood (routine x 2)     Status: None (Preliminary result)   Collection Time: 12/02/15 11:33 AM  Result Value Ref Range Status   Specimen Description BLOOD BLOOD RIGHT  HAND  Final   Special Requests IN PEDIATRIC BOTTLE 1CC  Final   Culture   Final    NO GROWTH < 24 HOURS Performed at [facility]    Report Status PENDING  Incomplete      Radiology Studies: No results found.   Medications:  Scheduled: . [START ON 12/05/2015] amLODipine  10 mg Oral Daily  . aspirin EC  81 mg Oral Daily  . atorvastatin  20 mg Oral q1800  . ciprofloxacin  500 mg Oral BID  . donepezil  5 mg Oral QHS  . heparin  5,000 Units Subcutaneous Q8H  . hydrOXYzine  25 mg Oral QHS  . [START ON 12/05/2015] metoprolol succinate  100 mg Oral Daily  . metroNIDAZOLE  500 mg Oral Q8H  . nicotine  7 mg Transdermal Daily  . QUEtiapine  12.5 mg Oral QHS   Continuous: . sodium chloride 75 mL/hr at 12/04/15 0209   ZOX:WRUEAVWUJ, baclofen, hydrALAZINE, HYDROcodone-acetaminophen, ondansetron **OR** ondansetron (ZOFRAN) IV  Assessment/Plan:  Active Problems:   Cigarette smoker   CAD (coronary artery disease)   Hypertensive heart disease   HLD (hyperlipidemia)   AKI (acute kidney injury) (HCC)   Diarrhea   Colovaginal fistula   Dementia   Insomnia   Chronic pain   Chronic diastolic heart failure (HCC)     Abdominal pain likely secondary to colovaginal fistula from chronic diverticulosis - Abdominal pain resolved. Pursue disposition. (Patient was hospitalized. She was placed  initially on intravenous Flagyl and oral vancomycin due to concern for C. difficile. General surgery was consulted. C. difficile PCR is negative. Patient was subsequently started on Cipro and Flagyl. Oral vancomycin was stopped. Gen. surgery spoke to the patient about doing a colostomy to allow that area of colovaginal fistula to heal. Patient has declined this procedure. Medical management will be pursued for now. Change to oral antibiotics today. Outpatient follow-up with general surgery if patient desires further management of the fistula. This is not an emergent situation).  Acute kidney injury - Resolving. Patient's BUN and creatinine are improving. Continue IV fluids. Monitor urine output.   Dementia Patient is followed by neurology. Continue Aricept and Seroquel. Patient's behavior is stable. However she seems to lack capacity to be able to take appropriate decisions by herself.  Hyperlipidemia  Continue statin  Essential hypertension - Increase Norvasc to 10 MG po once daily. Increase Toprol XL to  po once daily. Monitor closely.   Chronic diastolic congestive heart failure - Compensated.   H/o MI/CAD/CVA - Stable.    Psychosocial Patient apparently lives at home with one daughter is the primary intermittent caregiver. Patient with dementia and apparently has been unable to properly care for herself. Social worker and case management has been consulted. Evaluated by physical therapy. She will need placement to skilled nursing facility.    DVT Prophylaxis: Subcutaneous heparin    Code Status: Full code  Family Communication: Discussed with the patient. Discussed with one of the daughters yesterday at [phone]. Disposition Plan: Change to oral antibiotics today. Continue IV fluids. Repeat labs tomorrow morning. Anticipate discharge to SNF in 1-2 days.    LOS: 3 days   Barnetta Chapel  [facility] Pager [phone] 12/04/2015, 2:49 PM  If 7PM-7AM, please contact  night-coverage at [email or web address], password [facility]

## 2015-12-04 NOTE — [place] FL2 (Signed)
MEDICAID FL2 LEVEL OF CARE SCREENING TOOL     IDENTIFICATION  Patient Name: [name] Birthdate: 1955-11-04 Sex: female Admission Date (Current Location): 12/01/2015  [facility] and [place] Number:  Producer, television/film/video and Address:  [facility],  [address], [place] [address]      Provider Number: [id number]  Attending Physician Name and Address:  Barnetta Chapel, MD  Relative Name and Phone Number:  [phone]    Current Level of Care: Hospital Recommended Level of Care: Skilled Nursing Facility Prior Approval Number:    Date Approved/Denied:   PASRR Number:    Discharge Plan: SNF    Current Diagnoses: Patient Active Problem List   Diagnosis Date Noted  . Diarrhea 12/01/2015  . 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 Dementia 12/01/2015  . Insomnia 12/01/2015  . Chronic pain 12/01/2015  . Chronic diastolic heart failure (HCC) 12/01/2015  . AP (abdominal pain)   . Moderate dementia with behavioral disturbance 08/01/2015  . Visual hallucinations 08/01/2015  . HCAP (healthcare-associated pneumonia) 03/20/2015  . Edema   . LVH (left ventricular hypertrophy)   . Chest pain 02/19/2015  . Leg pain 02/18/2015  . Elevated troponin 02/18/2015  . Hypertension 02/18/2015  . AKI (acute kidney injury) (HCC) 02/18/2015  . Knee pain, acute   . Chronic respiratory failure- on home O2 02/17/2015  . Weakness 02/15/2015  . Hypertensive urgency 02/15/2015  . Hypertensive heart disease 11/08/2014  . HLD (hyperlipidemia) 11/08/2014  . Diverticular disease of large intestine   . Coronary artery disease   . Stroke (HCC)   . Non-ischemic cardiomyopathy (HCC)   . ACS (acute coronary syndrome) (HCC) 10/07/2014  . CAD (coronary artery disease)   . Hematochezia 04/28/2012  . Hypokalemia 04/28/2012  . Cigarette smoker 06/30/2006  . Morbid obesity (HCC) 03/26/2006  . CARDIOMYOPATHY 03/26/2006  . COPD pending/ still smoking  03/26/2006  . GERD  03/26/2006  . Osteoarthritis 03/26/2006    Orientation RESPIRATION BLADDER Height & Weight     Self, Situation, Place  Normal incontinent Weight: 275 lb 2.2 oz (124.8 kg) Height:     BEHAVIORAL SYMPTOMS/MOOD NEUROLOGICAL BOWEL NUTRITION STATUS      incontinent Diet (Soft diet)  AMBULATORY STATUS COMMUNICATION OF NEEDS Skin   Limited Assist Verbally Normal                       Personal Care Assistance Level of Assistance  Bathing, Feeding, Dressing Bathing Assistance: Limited assistance Feeding assistance: Limited assistance Dressing Assistance: Limited assistance     Functional Limitations Info  Sight, Hearing, Speech Sight Info: Adequate Hearing Info: Adequate Speech Info: Adequate    SPECIAL CARE FACTORS FREQUENCY  PT (By licensed PT)     PT Frequency: 5              Contractures Contractures Info: Not present    Additional Factors Info  Code Status, Allergies, Psychotropic Code Status Info: Full Code Allergies Info: Penicillins Psychotropic Info: yes         Current Medications (12/04/2015):  This is the current hospital active medication list Current Facility-Administered Medications  Medication Dose Route Frequency Provider Last Rate Last Dose  . 0.9 %  sodium chloride infusion   Intravenous Continuous Osvaldo Shipper, MD 75 mL/hr at 12/04/15 0209    . albuterol (PROVENTIL) (2.5 MG/3ML) 0.083% nebulizer solution 2.5 mg  2.5 mg Nebulization Q4H PRN Ozella Rocks, MD      . amLODipine (  NORVASC) tablet 5 mg  5 mg Oral Daily Osvaldo Shipper, MD   5 mg at 12/04/15 1030  . aspirin EC tablet 81 mg  81 mg Oral Daily Ozella Rocks, MD   81 mg at 12/04/15 1030  . atorvastatin (LIPITOR) tablet 20 mg  20 mg Oral q1800 Ozella Rocks, MD   20 mg at 12/03/15 2148  . baclofen (LIORESAL) tablet 10 mg  10 mg Oral TID PRN Ozella Rocks, MD      . ciprofloxacin (CIPRO) tablet 500 mg  500 mg Oral BID Osvaldo Shipper, MD   500 mg at 12/04/15 0830  . donepezil  (ARICEPT) tablet 5 mg  5 mg Oral QHS Ozella Rocks, MD   5 mg at 12/03/15 2149  . heparin injection 5,000 Units  5,000 Units Subcutaneous Q8H Ozella Rocks, MD   5,000 Units at 12/04/15 [phone]  . hydrALAZINE (APRESOLINE) injection 5-10 mg  5-10 mg Intravenous Q4H PRN Ozella Rocks, MD   10 mg at 12/01/15 2303  . HYDROcodone-acetaminophen (NORCO/VICODIN) 5-325 MG per tablet 1 tablet  1 tablet Oral Q8H PRN Ozella Rocks, MD      . hydrOXYzine (ATARAX/VISTARIL) tablet 25 mg  25 mg Oral QHS Rolan Bucco, MD   25 mg at 12/03/15 2149  . metoprolol succinate (TOPROL-XL) 24 hr tablet 50 mg  50 mg Oral Daily Ozella Rocks, MD   50 mg at 12/04/15 1030  . metroNIDAZOLE (FLAGYL) tablet 500 mg  500 mg Oral Q8H Osvaldo Shipper, MD   500 mg at 12/04/15 0609  . nicotine (NICODERM CQ - dosed in mg/24 hr) patch 7 mg  7 mg Transdermal Daily Ozella Rocks, MD   7 mg at 12/04/15 1034  . ondansetron (ZOFRAN) tablet 4 mg  4 mg Oral Q6H PRN Ozella Rocks, MD       Or  . ondansetron [facility]) injection 4 mg  4 mg Intravenous Q6H PRN Ozella Rocks, MD      . QUEtiapine (SEROQUEL) tablet 12.5 mg  12.5 mg Oral QHS Ozella Rocks, MD   12.5 mg at 12/03/15 2149     Discharge Medications: Please see discharge summary for a list of discharge medications.  Relevant Imaging Results:  Relevant Lab Results:   Additional Information [id number]  Clearance Coots, LCSW

## 2015-12-04 NOTE — Progress Notes (Signed)
Physical Therapy Treatment Patient Details Name: CESAR MUSACCHIA MRN: [id number] DOB: 21-Aug-1955 Today's Date: 12/04/2015    History of Present Illness This is a 60 year old African-American female with a past medical history of dementia, arthritis, stroke, diverticulosis, hypertension, presented with complaints of abdominal pain. She was found to have diverticulitis on June 12 and was sent home on Cipro and Flagyl. She presented with recurrent abdominal pain. Inflammation was noted around the sigmoid colon with the possibility of colo-vaginal fistula    PT Comments    Pt OOB in recliner with nursing student in room.  Pt required MAX encouragement to participate.  Pt was able to self rise from recliner and amb across the room for 7 feet.  Limited by fatigue.  Then assisted back to bed.  Pt will need ST Rehab at SNF.  Follow Up Recommendations  SNF     Equipment Recommendations       Recommendations for Other Services       Precautions / Restrictions Precautions Precautions: Fall Restrictions Weight Bearing Restrictions: No    Mobility  Bed Mobility Overal bed mobility: Needs Assistance Bed Mobility: Sit to Supine       Sit to supine: Max assist   General bed mobility comments: assist back to bed with increased time and assist B LE's  Transfers Overall transfer level: Needs assistance Equipment used: Rolling walker (2 wheeled) Transfers: Sit to/from [employer] Sit to Stand: Min guard Stand pivot transfers: Min guard       General transfer comment: close MinGuard with assist only for set up.  Max encouragement to increase self assist to shift body weight forward then power up due to BMI.    Ambulation/Gait Ambulation/Gait assistance: Min guard Ambulation Distance (Feet): 7 Feet Assistive device: Rolling walker (2 wheeled) Gait Pattern/deviations: Step-to pattern;Decreased step length - right;Decreased step length - left Gait velocity: decreased   General Gait Details: MAX encouragement by nursing student, pt amb accross the room with recliner closely following.     Stairs            Wheelchair Mobility    Modified Rankin (Stroke Patients Only)       Balance                                    Cognition Arousal/Alertness: Awake/alert Behavior During Therapy: WFL for tasks assessed/performed Overall Cognitive Status: Within Functional Limits for tasks assessed                      Exercises      General Comments        Pertinent Vitals/Pain Pain Assessment: Faces Faces Pain Scale: Hurts little more Pain Location: ABD Pain Descriptors / Indicators: Grimacing;Sore Pain Intervention(s): Monitored during session    Home Living                      Prior Function            PT Goals (current goals can now be found in the care plan section) Progress towards PT goals: Progressing toward goals    Frequency  Min 3X/week    PT Plan Current plan remains appropriate    Co-evaluation             End of Session Equipment Utilized During Treatment: Gait belt Activity Tolerance: Patient limited by fatigue (pt self limiting) Patient left:  in bed;with call bell/phone within reach;with bed alarm set     Time: 1610-9604 PT Time Calculation (min) (ACUTE ONLY): 17 min  Charges:  $Gait Training: 8-22 mins                    G Codes:      Felecia Shelling  PTA [facility]  Acute  Rehab Pager      [phone]

## 2015-12-04 NOTE — Progress Notes (Signed)
Pt refused 0000 CBG. Will continue to monitor pt.

## 2015-12-05 LAB — GLUCOSE, CAPILLARY
Glucose-Capillary: 107 mg/dL — ABNORMAL HIGH (ref 65–99)
Glucose-Capillary: 89 mg/dL (ref 65–99)
Glucose-Capillary: 152 mg/dL — ABNORMAL HIGH (ref 65–99)

## 2015-12-05 MED ORDER — SODIUM CHLORIDE 0.45 % IV SOLN
INTRAVENOUS | Status: DC
  Administered 2015-12-05 – 2015-12-12 (×8): via INTRAVENOUS

## 2015-12-05 MED ORDER — HYDRALAZINE HCL 50 MG PO TABS
50.0000 mg | ORAL_TABLET | Freq: Three times a day (TID) | ORAL | Status: DC
  Administered 2015-12-06 – 2015-12-12 (×20): 50 mg via ORAL
  Filled 2015-12-05 (×21): qty 1

## 2015-12-05 NOTE — Progress Notes (Signed)
PROGRESS NOTE    [name]  ZOX:[id number] DOB: Oct 11, 1955 DOA: 12/01/2015  PCP: Jackie Plum, MD   Brief Narrative:  60 year old female with dementia, stroke, diverticulosis, diverticular abscess in 2009, asthma hypertension who presented to the hospital with abdominal pain. She was diagnosed with acute diverticulitis on 6/12 and was discharged on Cipro and Flagyl. 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 She declined surgery. She is currently on antibiotics. She was also found to have acute renal injury which is steadily improving.  Subjective: She has no complaints. No complaint of abdominal pain. When asked specifically about vaginal discharge, she states she is having a lot of it. Nurses state that she is mostly incontinent.  Assessment & Plan:   Principal Problem:   Colovaginal fistula -General surgery recommends outpatient follow-up and antibiotics for now  Active Problems:  AKI - ? Prerenal-improving -Aisling creatinine 0.86-presented with a creatinine of 2.70 on 6/24 -Losartan on hold  Essential hypertension -BP has been quite elevated -Continue Norvasc and Toprol -  when necessary hydralazine IV -Change normal saline to half-normal saline -Add 3 times a day oral hydralazine  Dementia -Apparently lives alone and has a daughter taking care of her -On Aricept  Current smoker -Has nicotine patch   DVT prophylaxis: Heparin Code Status: Full code Family Communication:  Disposition Plan: Skilled nursing facility when stable Consultants:   Gen. Surgery Procedures:    Antimicrobials:  Anti-infectives    Start     Dose/Rate Route Frequency Ordered Stop   12/03/15 2000  ciprofloxacin (CIPRO) tablet 500 mg     500 mg Oral 2 times daily 12/03/15 0930     12/03/15 1400  metroNIDAZOLE (FLAGYL) tablet 500 mg     500 mg Oral Every 8 hours 12/03/15 0930     12/02/15 0900  ciprofloxacin (CIPRO) IVPB 400 mg  Status:  Discontinued     400 mg 200  mL/hr over 60 Minutes Intravenous Every 12 hours 12/02/15 0831 12/03/15 0930   12/01/15 1330  metroNIDAZOLE (FLAGYL) IVPB 500 mg  Status:  Discontinued     500 mg 100 mL/hr over 60 Minutes Intravenous Every 8 hours 12/01/15 1220 12/03/15 0930   12/01/15 1330  vancomycin (VANCOCIN) 50 mg/mL oral solution 125 mg  Status:  Discontinued     125 mg Oral Every 6 hours 12/01/15 1220 12/02/15 0831       Objective: Filed Vitals:   12/05/15 0534 12/05/15 1005 12/05/15 1124 12/05/15 1229  BP: 191/84 223/79 182/70 176/70  Pulse: 56 58 53 60  Temp: 99.1 F (37.3 C)     TempSrc: Oral     Resp: 20     Weight: 123 kg (271 lb 2.7 oz)     SpO2: 96%       Intake/Output Summary (Last 24 hours) at 12/05/15 1659 Last data filed at 12/05/15 1226  Gross per 24 hour  Intake     75 ml  Output      0 ml  Net     75 ml   Filed Weights   12/03/15 0530 12/04/15 0428 12/05/15 0534  Weight: 124.5 kg (274 lb 7.6 oz) 124.8 kg (275 lb 2.2 oz) 123 kg (271 lb 2.7 oz)    Examination: General exam: Appears comfortable  HEENT: PERRLA, oral mucosa moist, no sclera icterus or thrush Respiratory system: Clear to auscultation. Respiratory effort normal. Cardiovascular system: S1 & S2 heard, RRR.  No murmurs  Gastrointestinal system: Abdomen soft, non-tender, nondistended. Normal bowel sound.  No organomegaly Central nervous system: Alert and oriented. No focal neurological deficits. Extremities: No cyanosis, clubbing or edema Skin: No rashes or ulcers Psychiatry:  Mood & affect appropriate.     Data Reviewed: I have personally reviewed following labs and imaging studies  CBC:  Recent Labs Lab 12/01/15 0705 12/02/15 1135  WBC 14.2* 6.1  NEUTROABS 11.8*  --   HGB 13.4 13.4  HCT 40.9 40.3  MCV 77.2* 76.3*  PLT 163 147*   Basic Metabolic Panel:  Recent Labs Lab 12/01/15 0705 12/02/15 1135 12/03/15 0945 12/04/15 0937  NA 143 141 142 143  K 4.4 3.8 3.3* 3.6  CL 115* 113* 113* 114*  CO2 22 22  22 22   GLUCOSE 114* 131* 123* 162*  BUN 41* 28* 31* 26*  CREATININE 2.70* 1.95* 1.80* 1.60*  CALCIUM 8.5* 8.4* 8.6* 8.7*   GFR: CrCl cannot be calculated (Unknown ideal weight.). Liver Function Tests:  Recent Labs Lab 12/01/15 0705 12/02/15 1135  AST 23 21  ALT 15 13*  ALKPHOS 56 49  BILITOT 0.6 0.8  PROT 7.1 6.6  ALBUMIN 3.6 3.3*    Recent Labs Lab 12/01/15 0739  LIPASE 31   No results for input(s): AMMONIA in the last 168 hours. Coagulation Profile: No results for input(s): INR, PROTIME in the last 168 hours. Cardiac Enzymes: No results for input(s): CKTOTAL, CKMB, CKMBINDEX, TROPONINI in the last 168 hours. BNP (last 3 results) No results for input(s): PROBNP in the last 8760 hours. HbA1C: No results for input(s): HGBA1C in the last 72 hours. CBG:  Recent Labs Lab 12/04/15 1211 12/04/15 1832 12/05/15 0101 12/05/15 0609 12/05/15 1449  GLUCAP 146* 110* 107* 89 152*   Lipid Profile: No results for input(s): CHOL, HDL, LDLCALC, TRIG, CHOLHDL, LDLDIRECT in the last 72 hours. Thyroid Function Tests: No results for input(s): TSH, T4TOTAL, FREET4, T3FREE, THYROIDAB in the last 72 hours. Anemia Panel: No results for input(s): VITAMINB12, FOLATE, FERRITIN, TIBC, IRON, RETICCTPCT in the last 72 hours. Urine analysis:    Component Value Date/Time   COLORURINE YELLOW 12/01/2015 0638   APPEARANCEUR CLOUDY* 12/01/2015 0638   LABSPEC 1.016 12/01/2015 0638   PHURINE 5.0 12/01/2015 0638   GLUCOSEU NEGATIVE 12/01/2015 0638   HGBUR NEGATIVE 12/01/2015 0638   BILIRUBINUR NEGATIVE 12/01/2015 0638   KETONESUR NEGATIVE 12/01/2015 0638   PROTEINUR NEGATIVE 12/01/2015 0638   UROBILINOGEN 0.2 02/15/2015 1615   NITRITE NEGATIVE 12/01/2015 0638   LEUKOCYTESUR NEGATIVE 12/01/2015 0638   Sepsis Labs: @LABRCNTIP (procalcitonin:4,lacticidven:4) ) Recent Results (from the past 240 hour(s))  Culture, blood (routine x 2)     Status: None (Preliminary result)   Collection Time:  12/01/15 12:55 PM  Result Value Ref Range Status   Specimen Description BLOOD LEFT ANTECUBITAL  Final   Special Requests BOTTLES DRAWN AEROBIC AND ANAEROBIC 5 CC EACH  Final   Culture   Final    NO GROWTH 4 DAYS Performed at [facility]    Report Status PENDING  Incomplete  C difficile quick scan w PCR reflex     Status: None   Collection Time: 12/01/15  3:23 PM  Result Value Ref Range Status   C Diff antigen NEGATIVE NEGATIVE Final   C Diff toxin NEGATIVE NEGATIVE Final   C Diff interpretation Negative for toxigenic C. difficile  Final  Gastrointestinal Panel by PCR , Stool     Status: None   Collection Time: 12/01/15  3:23 PM  Result Value Ref Range Status   Campylobacter species NOT  DETECTED NOT DETECTED Final   Plesimonas shigelloides NOT DETECTED NOT DETECTED Final   Salmonella species NOT DETECTED NOT DETECTED Final   Yersinia enterocolitica NOT DETECTED NOT DETECTED Final   Vibrio species NOT DETECTED NOT DETECTED Final   Vibrio cholerae NOT DETECTED NOT DETECTED Final   Enteroaggregative E coli (EAEC) NOT DETECTED NOT DETECTED Final   Enteropathogenic E coli (EPEC) NOT DETECTED NOT DETECTED Final   Enterotoxigenic E coli (ETEC) NOT DETECTED NOT DETECTED Final   Shiga like toxin producing E coli (STEC) NOT DETECTED NOT DETECTED Final   E. coli O157 NOT DETECTED NOT DETECTED Final   Shigella/Enteroinvasive E coli (EIEC) NOT DETECTED NOT DETECTED Final   Cryptosporidium NOT DETECTED NOT DETECTED Final   Cyclospora cayetanensis NOT DETECTED NOT DETECTED Final   Entamoeba histolytica NOT DETECTED NOT DETECTED Final   Giardia lamblia NOT DETECTED NOT DETECTED Final   Adenovirus F40/41 NOT DETECTED NOT DETECTED Final   Astrovirus NOT DETECTED NOT DETECTED Final   Norovirus GI/GII NOT DETECTED NOT DETECTED Final   Rotavirus A NOT DETECTED NOT DETECTED Final   Sapovirus (I, II, IV, and V) NOT DETECTED NOT DETECTED Final  Culture, blood (routine x 2)     Status: None  (Preliminary result)   Collection Time: 12/02/15 11:33 AM  Result Value Ref Range Status   Specimen Description BLOOD BLOOD RIGHT HAND  Final   Special Requests IN PEDIATRIC BOTTLE 1CC  Final   Culture   Final    NO GROWTH 3 DAYS Performed at [facility]    Report Status PENDING  Incomplete         Radiology Studies: No results found.    Scheduled Meds: . amLODipine  10 mg Oral Daily  . aspirin EC  81 mg Oral Daily  . atorvastatin  20 mg Oral q1800  . ciprofloxacin  500 mg Oral BID  . donepezil  5 mg Oral QHS  . heparin  5,000 Units Subcutaneous Q8H  . hydrOXYzine  25 mg Oral QHS  . metoprolol succinate  100 mg Oral Daily  . metroNIDAZOLE  500 mg Oral Q8H  . nicotine  7 mg Transdermal Daily  . QUEtiapine  12.5 mg Oral QHS   Continuous Infusions: . sodium chloride 75 mL/hr at 12/05/15 0642     LOS: 4 days    Time spent in minutes: 35    Kaziah Krizek, MD [facility] Pager: [email or web address] Password [facility] 12/05/2015, 4:59 PM

## 2015-12-05 NOTE — Clinical Social Work Note (Addendum)
Clinical Social Work Assessment  Patient Details  Name: [name] MRN: [id number] Date of Birth: 26-Apr-1956  Date of referral:  12/05/15               Reason for consult:  Facility Placement                Permission sought to share information with:  Case Manager Permission granted to share information::  Yes, Verbal Permission Granted  Name::        Agency::  SNF  Relationship::  Daughters  Contact Information:     Housing/Transportation Living arrangements for the past 2 months:  [place], [place] of Information:  Patient, Adult Children Patient Interpreter Needed:  None Criminal Activity/Legal Involvement Pertinent to Current Situation/Hospitalization:  No - Comment as needed Significant Relationships:  Adult Children Lives with:  Self Do you feel safe going back to the place where you live?  No Need for family participation in patient care:  Yes (Comment)  Care giving concerns: Patient was admitted for abdominal pain. Patient has been living alone at home recently and prior at [facility] for six months.  Patient and daughter both do not feel patient can manage at home alone. As she is not very ambulatory and physical therapy recommends SNF.   Social Worker assessment / plan:  LCSWA met with patient at bedside, patient was agreeable to complete assessment. Patient reports she does not feel safe going home because she cannot manage alone. Her daughters are supportive but needs more assistance. Patient states she was at [facility] for six months but was told to leave for smoking. [place] educated patient about non smoking policy with other SNF's and suggested nicotine patch. Patient requested to see her MD. As she has changed her mind about surgery and wants the procedure. Patient gets a disability check every month and is worried about the cost of [place], as she was still able to get 700 dollars back at  previous SNF. Patient disclosed she is afraid to go back home because she can see eyes in her home and dead people. Patient denies any prior physiatric help. Patient says her family is aware of her visuals. Patient states she is loosing her memory and forgets everything.   Plan: Assist patient and family with SNF placement upon Discharge.   Employment status:  Disabled. Patient is receiving disability.  Insurance information:  Medicaid In [place] PT Recommendations:  [place] / Referral to community resources:  [place]  Patient/Family's Response to care: Agreeable.   Patient/Family's Understanding of and Emotional Response to Diagnosis, Current Treatment, and Prognosis:  Patient daughter [name] is very involved with patient care and wants her mother to be placed in a facility long term as patient cannot care for herself. Patient has been refusing her insulin and refused surgery. Patient wants to go through with surgery now and requested to speak with MD. Patient   Emotional Assessment Appearance:  Appears stated age Attitude/Demeanor/Rapport:    Affect (typically observed):  Accepting, Calm, Pleasant (Cooperative) Orientation:  Oriented to Self, Oriented to Place, Oriented to Situation Alcohol / Substance use:  Not Applicable Psych involvement (Current and /or in the community):  No (Comment)  Discharge Needs  Concerns to be addressed:  No discharge needs identified Readmission within the last 30 days:  Yes Current discharge risk:  None Barriers to Discharge:  No Barriers Identified   Lia Hopping,  LCSW 12/05/2015, 11:18 AM

## 2015-12-05 NOTE — Progress Notes (Signed)
PT Cancellation Note  Patient Details Name: [name] MRN: [id number] DOB: 1956/04/05   Cancelled Treatment:     Pt declined any OOB activity.     Armando Reichert 12/05/2015, 1:43 PM

## 2015-12-05 NOTE — Progress Notes (Signed)
Occupational Therapy Treatment Patient Details Name: LARK TORBECK MRN: [id number] DOB: 1955/08/11 Today's Date: 12/05/2015    History of present illness This is a 60 year old African-American female with a past medical history of dementia, arthritis, stroke, diverticulosis, hypertension, presented with complaints of abdominal pain. She was found to have diverticulitis on June 12 and was sent home on Cipro and Flagyl. She presented with recurrent abdominal pain. Inflammation was noted around the sigmoid colon with the possibility of colo-vaginal fistula   OT comments  Pt able to stand approx 5 min during hygiene after BM. Pt total A with cleaning self   Follow Up Recommendations  SNF          Precautions / Restrictions Precautions Precautions: Fall       Mobility Bed Mobility Overal bed mobility: Needs Assistance Bed Mobility: Sit to Supine       Sit to supine: Max assist   General bed mobility comments: assist back to bed with increased time and assist B LE's  Transfers Overall transfer level: Needs assistance Equipment used: Rolling walker (2 wheeled) Transfers: Sit to/from [employer] Sit to Stand: Mod assist Stand pivot transfers: Min guard;Mod assist                ADL Overall ADL's : Needs assistance/impaired Eating/Feeding: Sitting                       Toilet Transfer: Moderate assistance;Cueing for safety;RW;BSC   Toileting- Clothing Manipulation and Hygiene: Sit to/from stand;Cueing for sequencing;Cueing for safety;Maximal assistance;Total assistance;+2 for physical assistance;+2 for safety/equipment                          Cognition   Behavior During Therapy: Flat affect Overall Cognitive Status: Within Functional Limits for tasks assessed                                        Frequency       Progress Toward Goals  OT Goals(current goals can now be found in the care plan section)  Progress towards OT goals: Progressing toward goals     Plan Discharge plan remains appropriate    Co-evaluation                 End of Session Equipment Utilized During Treatment: Rolling walker   Activity Tolerance Patient tolerated treatment well   Patient Left in chair;with call bell/phone within reach;with nursing/sitter in room;with chair alarm set   Nurse Communication Mobility status        Time: 9794-8016 OT Time Calculation (min): 40 min  Charges: OT General Charges $OT Visit: 1 Procedure OT Treatments $Self Care/Home Management : 38-52 mins  Guillermo Nehring, [place] 12/05/2015, 9:48 AM

## 2015-12-05 NOTE — Progress Notes (Signed)
Patient declines to get OOB to bedside commode, and for her vital signs to be taken. Patient was educated on need for vital sign monitoring, and for the need of collecting her out put. Patient still declined. Attending MD is aware.

## 2015-12-06 LAB — CULTURE, BLOOD (ROUTINE X 2): CULTURE: NO GROWTH

## 2015-12-06 LAB — GLUCOSE, CAPILLARY
GLUCOSE-CAPILLARY: 142 mg/dL — AB (ref 65–99)
GLUCOSE-CAPILLARY: 84 mg/dL (ref 65–99)
GLUCOSE-CAPILLARY: 96 mg/dL (ref 65–99)
GLUCOSE-CAPILLARY: 97 mg/dL (ref 65–99)
Glucose-Capillary: 104 mg/dL — ABNORMAL HIGH (ref 65–99)

## 2015-12-06 NOTE — Progress Notes (Signed)
PROGRESS NOTE    [name]  ZOX:[id number] DOB: [birth year] DOA: 12/01/2015  PCP: Jackie Plum, MD   Brief Narrative:  60 year old female with dementia, stroke, diverticulosis, diverticular abscess in 2009, asthma hypertension who presented to the hospital with abdominal pain. She was diagnosed with acute diverticulitis on 6/12 and was discharged on Cipro and Flagyl. 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 She declined surgery. She is currently on antibiotics. She was also found to have acute renal injury which is steadily improving.  Subjective: She has no complaints.    Assessment & Plan:   Principal Problem:   Colovaginal fistula -  incontinent of stool- dementia too severe for her to tell staff when she has the urge to urinate or have a BM -General surgery recommends outpatient follow-up and antibiotics for now - have spoken with her daughter who states she and her sisters would like her to have surgery here- will re-contact surgery  Active Problems:  AKI - ? Prerenal-improving -baseline creatinine 0.86-presented with a creatinine of 2.70 on 6/24 -Losartan on hold  Essential hypertension -BP has been quite elevated -Continue Norvasc and Toprol -  when necessary hydralazine IV -Change normal saline to half-normal saline -Added 3 times a day oral hydralazine  Dementia -Apparently lives alone and has a daughter taking care of her -On Aricept  Current smoker -Has nicotine patch   DVT prophylaxis: Heparin Code Status: Full code Family Communication: Dorris Carnes Disposition Plan: Skilled nursing facility when stable Consultants:   Gen. Surgery Procedures:    Antimicrobials:  Anti-infectives    Start     Dose/Rate Route Frequency Ordered Stop   12/03/15 2000  ciprofloxacin (CIPRO) tablet 500 mg     500 mg Oral 2 times daily 12/03/15 0930     12/03/15 1400  metroNIDAZOLE (FLAGYL) tablet 500 mg     500 mg Oral Every 8 hours 12/03/15 0930       12/02/15 0900  ciprofloxacin (CIPRO) IVPB 400 mg  Status:  Discontinued     400 mg 200 mL/hr over 60 Minutes Intravenous Every 12 hours 12/02/15 0831 12/03/15 0930   12/01/15 1330  metroNIDAZOLE (FLAGYL) IVPB 500 mg  Status:  Discontinued     500 mg 100 mL/hr over 60 Minutes Intravenous Every 8 hours 12/01/15 1220 12/03/15 0930   12/01/15 1330  vancomycin (VANCOCIN) 50 mg/mL oral solution 125 mg  Status:  Discontinued     125 mg Oral Every 6 hours 12/01/15 1220 12/02/15 0831       Objective: Filed Vitals:   12/05/15 1124 12/05/15 1229 12/05/15 2153 12/06/15 0642  BP: 182/70 176/70 155/64 160/72  Pulse: 53 60 57 62  Temp:   98.6 F (37 C) 97.9 F (36.6 C)  TempSrc:   Oral Oral  Resp:   18 16  Weight:    121.7 kg (268 lb 4.8 oz)  SpO2:   98% 97%    Intake/Output Summary (Last 24 hours) at 12/06/15 1534 Last data filed at 12/06/15 1330  Gross per 24 hour  Intake    960 ml  Output      0 ml  Net    960 ml   Filed Weights   12/04/15 0428 12/05/15 0534 12/06/15 0642  Weight: 124.8 kg (275 lb 2.2 oz) 123 kg (271 lb 2.7 oz) 121.7 kg (268 lb 4.8 oz)    Examination: General exam: Appears comfortable  HEENT: PERRLA, oral mucosa moist, no sclera icterus or thrush Respiratory system: Clear to  auscultation. Respiratory effort normal. Cardiovascular system: S1 & S2 heard, RRR.  No murmurs  Gastrointestinal system: Abdomen soft, non-tender, nondistended. Normal bowel sound. No organomegaly Central nervous system: Alert and oriented. No focal neurological deficits. Extremities: No cyanosis, clubbing or edema Skin: No rashes or ulcers Psychiatry:  Mood & affect appropriate.     Data Reviewed: I have personally reviewed following labs and imaging studies  CBC:  Recent Labs Lab 12/01/15 0705 12/02/15 1135  WBC 14.2* 6.1  NEUTROABS 11.8*  --   HGB 13.4 13.4  HCT 40.9 40.3  MCV 77.2* 76.3*  PLT 163 147*   Basic Metabolic Panel:  Recent Labs Lab 12/01/15 0705  12/02/15 1135 12/03/15 0945 12/04/15 0937  NA 143 141 142 143  K 4.4 3.8 3.3* 3.6  CL 115* 113* 113* 114*  CO2 GLUCOSE 114* 131* 123* 162*  BUN 41* 28* 31* 26*  CREATININE 2.70* 1.95* 1.80* 1.60*  CALCIUM 8.5* 8.4* 8.6* 8.7*   GFR: CrCl cannot be calculated (Unknown ideal weight.). Liver Function Tests:  Recent Labs Lab 12/01/15 0705 12/02/15 1135  AST 23 21  ALT 15 13*  ALKPHOS 56 49  BILITOT 0.6 0.8  PROT 7.1 6.6  ALBUMIN 3.6 3.3*    Recent Labs Lab 12/01/15 0739  LIPASE 31   No results for input(s): AMMONIA in the last 168 hours. Coagulation Profile: No results for input(s): INR, PROTIME in the last 168 hours. Cardiac Enzymes: No results for input(s): CKTOTAL, CKMB, CKMBINDEX, TROPONINI in the last 168 hours. BNP (last 3 results) No results for input(s): PROBNP in the last 8760 hours. HbA1C: No results for input(s): HGBA1C in the last 72 hours. CBG:  Recent Labs Lab 12/05/15 0609 12/05/15 1449 12/06/15 0001 12/06/15 0628 12/06/15 1244  GLUCAP 89 152* 96 84 104*   Lipid Profile: No results for input(s): CHOL, HDL, LDLCALC, TRIG, CHOLHDL, LDLDIRECT in the last 72 hours. Thyroid Function Tests: No results for input(s): TSH, T4TOTAL, FREET4, T3FREE, THYROIDAB in the last 72 hours. Anemia Panel: No results for input(s): VITAMINB12, FOLATE, FERRITIN, TIBC, IRON, RETICCTPCT in the last 72 hours. Urine analysis:    Component Value Date/Time   COLORURINE YELLOW 12/01/2015 0638   APPEARANCEUR CLOUDY* 12/01/2015 0638   LABSPEC 1.016 12/01/2015 0638   PHURINE 5.0 12/01/2015 0638   GLUCOSEU NEGATIVE 12/01/2015 0638   HGBUR NEGATIVE 12/01/2015 0638   BILIRUBINUR NEGATIVE 12/01/2015 0638   KETONESUR NEGATIVE 12/01/2015 0638   PROTEINUR NEGATIVE 12/01/2015 0638   UROBILINOGEN 0.2 02/15/2015 1615   NITRITE NEGATIVE 12/01/2015 0638   LEUKOCYTESUR NEGATIVE 12/01/2015 0638   Sepsis Labs: (procalcitonin:4,lacticidven:4) ) Recent  Results (from the past 240 hour(s))  Culture, blood (routine x 2)     Status: None   Collection Time: 12/01/15 12:55 PM  Result Value Ref Range Status   Specimen Description BLOOD LEFT ANTECUBITAL  Final   Special Requests BOTTLES DRAWN AEROBIC AND ANAEROBIC 5 CC EACH  Final   Culture   Final    NO GROWTH 5 DAYS Performed at [facility]    Report Status 12/06/2015 FINAL  Final  C difficile quick scan w PCR reflex     Status: None   Collection Time: 12/01/15  3:23 PM  Result Value Ref Range Status   C Diff antigen NEGATIVE NEGATIVE Final   C Diff toxin NEGATIVE NEGATIVE Final   C Diff interpretation Negative for toxigenic C. difficile  Final  Gastrointestinal Panel by PCR , Stool  Status: None   Collection Time: 12/01/15  3:23 PM  Result Value Ref Range Status   Campylobacter species NOT DETECTED NOT DETECTED Final   Plesimonas shigelloides NOT DETECTED NOT DETECTED Final   Salmonella species NOT DETECTED NOT DETECTED Final   Yersinia enterocolitica NOT DETECTED NOT DETECTED Final   Vibrio species NOT DETECTED NOT DETECTED Final   Vibrio cholerae NOT DETECTED NOT DETECTED Final   Enteroaggregative E coli (EAEC) NOT DETECTED NOT DETECTED Final   Enteropathogenic E coli (EPEC) NOT DETECTED NOT DETECTED Final   Enterotoxigenic E coli (ETEC) NOT DETECTED NOT DETECTED Final   Shiga like toxin producing E coli (STEC) NOT DETECTED NOT DETECTED Final   E. coli O157 NOT DETECTED NOT DETECTED Final   Shigella/Enteroinvasive E coli (EIEC) NOT DETECTED NOT DETECTED Final   Cryptosporidium NOT DETECTED NOT DETECTED Final   Cyclospora cayetanensis NOT DETECTED NOT DETECTED Final   Entamoeba histolytica NOT DETECTED NOT DETECTED Final   Giardia lamblia NOT DETECTED NOT DETECTED Final   Adenovirus F40/41 NOT DETECTED NOT DETECTED Final   Astrovirus NOT DETECTED NOT DETECTED Final   Norovirus GI/GII NOT DETECTED NOT DETECTED Final   Rotavirus A NOT DETECTED NOT DETECTED Final    Sapovirus (I, II, IV, and V) NOT DETECTED NOT DETECTED Final  Culture, blood (routine x 2)     Status: None (Preliminary result)   Collection Time: 12/02/15 11:33 AM  Result Value Ref Range Status   Specimen Description BLOOD BLOOD RIGHT HAND  Final   Special Requests IN PEDIATRIC BOTTLE 1CC  Final   Culture   Final    NO GROWTH 4 DAYS Performed at [facility]    Report Status PENDING  Incomplete         Radiology Studies: No results found.    Scheduled Meds: . amLODipine  10 mg Oral Daily  . aspirin EC  81 mg Oral Daily  . atorvastatin  20 mg Oral q1800  . ciprofloxacin  500 mg Oral BID  . donepezil  5 mg Oral QHS  . heparin  5,000 Units Subcutaneous Q8H  . hydrALAZINE  50 mg Oral Q8H  . hydrOXYzine  25 mg Oral QHS  . metoprolol succinate  100 mg Oral Daily  . metroNIDAZOLE  500 mg Oral Q8H  . nicotine  7 mg Transdermal Daily  . QUEtiapine  12.5 mg Oral QHS   Continuous Infusions: . sodium chloride 75 mL/hr at 12/06/15 0930     LOS: 5 days    Time spent in minutes: 35    Kirin Pastorino, MD [facility] Pager: [email or web address] Password [facility] 12/06/2015, 3:34 PM

## 2015-12-06 NOTE — Progress Notes (Signed)
PT Cancellation Note  Patient Details Name: [name] MRN: [id number] DOB: 1955/09/04   Cancelled Treatment:     Pt declined any OOB activity.  Offered BSC.  Offered recliner.  Offered to promise to put her back to bed.  Pt was not interested.  Will re attempt another day and cont to follow.   Armando Reichert 12/06/2015, 3:59 PM

## 2015-12-07 LAB — GLUCOSE, CAPILLARY
GLUCOSE-CAPILLARY: 109 mg/dL — AB (ref 65–99)
GLUCOSE-CAPILLARY: 86 mg/dL (ref 65–99)
GLUCOSE-CAPILLARY: 115 mg/dL — AB (ref 65–99)
GLUCOSE-CAPILLARY: 135 mg/dL — AB (ref 65–99)
Glucose-Capillary: 74 mg/dL (ref 65–99)
Glucose-Capillary: 104 mg/dL — ABNORMAL HIGH (ref 65–99)

## 2015-12-07 LAB — CULTURE, BLOOD (ROUTINE X 2): CULTURE: NO GROWTH

## 2015-12-07 MED ORDER — AMLODIPINE BESYLATE 10 MG PO TABS: 10.0000 mg | ORAL_TABLET | Freq: Every day | ORAL | Status: DC

## 2015-12-07 MED ORDER — SODIUM CHLORIDE 0.9 % IV SOLN: INTRAVENOUS | Status: DC

## 2015-12-07 MED ORDER — PEG 3350-KCL-NA BICARB-NACL 420 G PO SOLR
4000.0000 mL | Freq: Once | ORAL | Status: AC
  Administered 2015-12-08: 4000 mL via ORAL

## 2015-12-07 MED ORDER — PEG 3350-KCL-NA BICARB-NACL 420 G PO SOLR
4000.0000 mL | Freq: Once | ORAL | Status: AC
  Administered 2015-12-09: 4000 mL via ORAL

## 2015-12-07 MED ORDER — HYDROCODONE-ACETAMINOPHEN 5-325 MG PO TABS: 1.0000 | ORAL_TABLET | Freq: Three times a day (TID) | ORAL | Status: DC | PRN

## 2015-12-07 NOTE — Progress Notes (Signed)
LCSWA spoke with patient daughter about patient possible discharge next week. Daughter reports she is waiting on updates from MD. Patient family has agreed to fill out paper work today at [facility].  Vivi Barrack, Theresia Majors, MSW Clinical Social Worker 5E and Psychiatric Service Line [phone] 12/07/2015  1:56 PM

## 2015-12-07 NOTE — Progress Notes (Signed)
PT Cancellation Note  Patient Details Name: [name] MRN: [id number] DOB: Jul 01, 1955   Cancelled Treatment:     Pt declined despite several encouraging attempts.  Was OOB in recliner "waiting on some food".   Pt declines LPT rec for SNF and plans to return home.   Armando Reichert 12/07/2015, 1:46 PM

## 2015-12-07 NOTE — Progress Notes (Signed)
Pt with colovaginal fistula.  Also has some fecal incontinence.  Colostomy was recommended but pt refused.  Now family would like to pursue surgery.  Would recommend colonoscopic evaluation to rule out any tumor or foreign body as the cause of her fistula.  Once this is complete, surgical options would be a diverting colostomy in the near future vs resection and anastomosis in 3 months. Pt would also need cardiac and pulmonary clearance prior to surgery.  No need for antibiotics to treat the fistula.  Please call me if there are any questions or concerns.  Vanita Panda, MD  Colorectal and General Surgery [facility]

## 2015-12-07 NOTE — Consult Note (Addendum)
Referring Provider: Dr. Butler Denmark Primary Care Physician:  Jackie Plum, MD Primary Gastroenterologist:  Gentry Fitz  Reason for Consultation:    HPI: [name] is a 60 y.o. female with dementia, morbid obesity, history of stroke who is being seen for a consult due to recommendation for a colonoscopy prior to possible surgery for a colovaginal fistula. She had a flex sig in 2013 that showed sigmoid diverticulosis and internal and external hemorrhoids. She was recently on Cipro/Flagyl for presumed diverticulitis thought to be present due to inflammation around the sigmoid colon on 11/20/15. Repeat noncontrast CT on 12/01/15 did not show any colonic inflammation but did show findings concerning for a colovaginal fistula. No abscess or bowel obstruction see. She is oriented to person and place but not time. She has been unable to tell staff when she needs to defecate.    Past Medical History  Diagnosis Date  . Arthritis   . Stroke (HCC)   . Coronary artery disease     a. remote hx of cath ~2000. B. cath  moderate disease in an RPL branch - too distal for PCI; 3rd RPLB lesion, 50% stenosed; hyperdynamic LV with near cavity obliteration & severely elevated LVEDP  . Asthma   . 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 Non-ischemic cardiomyopathy (HCC)     cxr - mild cardiomegly, Echo 10/07/14 - severe LVH with LVEF 65-70%.  . Hypertension   . Heart attack (HCC)   . Chronic bronchitis (HCC)   . Heart murmur   . Wheelchair bound   . Bilateral chronic knee pain   . Accelerated hypertension   . Dementia   . Diastolic CHF [facility])     Past Surgical History  Procedure Laterality Date  . Abdominal hysterectomy    . I&d extremity  08/18/2011    Procedure: IRRIGATION AND DEBRIDEMENT EXTREMITY;  Surgeon: Sharma Covert, MD;  Location: [facility] OR;  Service: Orthopedics;  Laterality: Right;  I&D Right Long Finger  . Amputation  08/21/2011     Procedure: AMPUTATION DIGIT;  Surgeon: Sharma Covert, MD;  Location: [facility] OR;  Service: Orthopedics;  Laterality: Right;  . Flexible sigmoidoscopy  04/28/2012    Procedure: FLEXIBLE SIGMOIDOSCOPY;  Surgeon: Petra Kuba, MD;  Location: [facility] ENDOSCOPY;  Service: Endoscopy;  Laterality: N/A;  unsedated, unprepped  . Cardiac catheterization N/A 10/09/2014    Procedure: Left Heart Cath and Coronary Angiography;  Surgeon: Marykay Lex, MD;  Location: [facility] INVASIVE CV LAB CUPID;  Service: Cardiovascular;  Laterality: N/A;  . Coronary stent placement    . Cardiac catheterization  10/2014    normal coronary arteries aside for a distal RPLB branch with 50% stenosis    Prior to Admission medications   Medication Sig Start Date End Date Taking? Authorizing Provider  albuterol (PROVENTIL HFA;VENTOLIN HFA) 108 (90 BASE) MCG/ACT inhaler 1 to 2 puffs every 4 to 6 hours as needed Patient taking differently: Inhale 1-2 puffs into the lungs every 4 (four) hours as needed for wheezing or shortness of breath.  12/26/14  Yes Scott Moishe Spice, PA-C  atorvastatin (LIPITOR) 20 MG tablet Take 1 tablet (20 mg total) by mouth daily at 6 PM. 02/17/15  Yes Calvert Cantor, MD  baclofen (LIORESAL) 10 MG tablet Take 10 mg by mouth 3 (three) times daily as needed for muscle spasms.  11/07/15  Yes Historical Provider, MD  donepezil (ARICEPT) 10 MG tablet Take 1/2 tablet daily for  1 month, then increase to 1 tablet daily Patient taking differently: Take 5 mg by mouth at bedtime.  07/25/15  Yes Van Clines, MD  hydrOXYzine (VISTARIL) 25 MG capsule Take 25 mg by mouth at bedtime. 11/07/15  Yes Historical Provider, MD  losartan (COZAAR) 50 MG tablet Take 50 mg by mouth daily.  02/17/15  Yes Historical Provider, MD  metoprolol succinate (TOPROL-XL) 50 MG 24 hr tablet Take 50 mg by mouth daily. 03/20/15  Yes Historical Provider, MD  QUEtiapine (SEROQUEL) 25 MG tablet Take 1/2 tablet at night Patient taking differently: Take 12.5 mg by mouth at  bedtime.  07/25/15  Yes Van Clines, MD  amLODipine (NORVASC) 10 MG tablet Take 1 tablet (10 mg total) by mouth daily. 12/07/15   Calvert Cantor, MD  HYDROcodone-acetaminophen (NORCO/VICODIN) 5-325 MG tablet Take 1 tablet by mouth every 8 (eight) hours as needed for moderate pain or severe pain. 12/07/15   Calvert Cantor, MD    Scheduled Meds: . amLODipine  10 mg Oral Daily  . aspirin EC  81 mg Oral Daily  . atorvastatin  20 mg Oral q1800  . ciprofloxacin  500 mg Oral BID  . donepezil  5 mg Oral QHS  . heparin  5,000 Units Subcutaneous Q8H  . hydrALAZINE  50 mg Oral Q8H  . hydrOXYzine  25 mg Oral QHS  . metoprolol succinate  100 mg Oral Daily  . metroNIDAZOLE  500 mg Oral Q8H  . nicotine  7 mg Transdermal Daily  . QUEtiapine  12.5 mg Oral QHS   Continuous Infusions: . sodium chloride 75 mL/hr at 12/06/15 2249   PRN Meds:.albuterol, baclofen, hydrALAZINE, HYDROcodone-acetaminophen, ondansetron **OR** ondansetron (ZOFRAN) IV  Allergies as of 12/01/2015 - Review Complete 12/01/2015  Allergen Reaction Noted  . Penicillins Itching and Nausea And Vomiting     Family History  Problem Relation Age of Onset  . Diabetes type I Mother   . Hypertension Mother   . Arthritis Mother   . Stroke Sister   . Diabetes type I Brother   . Hypertension Brother   . Healthy Brother   . Diabetes type II Brother   . Heart disease Brother   . Healthy Brother   . Healthy Brother   . Diabetes type I Sister   . Hypertension Sister   . Asthma Sister   . Bronchitis Sister     Social History   Social History  . Marital Status: Legally Separated    Spouse Name: N/A  . Number of Children: N/A  . Years of Education: N/A   Occupational History  . Not on file.   Social History Main Topics  . Smoking status: Current Every Day Smoker -- 0.50 packs/day for 20 years    Types: Cigarettes  . Smokeless tobacco: Former Neurosurgeon    Types: Snuff  . Alcohol Use: 3.6 oz/week    6 Cans of beer per week      Comment: 04/27/12- "amount varies"  . Drug Use: No  . Sexual Activity: No   Other Topics Concern  . Not on file   Social History Narrative   ** Merged History Encounter **        Review of Systems: All negative except as stated above in HPI.  Physical Exam: Vital signs: Filed Vitals:   12/06/15 0642 12/07/15 0654  BP: 160/72 156/64  Pulse: 62 64  Temp: 97.9 F (36.6 C) 98.1 F (36.7 C)  Resp: 16    Last BM Date:  12/06/15 General:  Lethargic, obese, oriented to person and place not time, no acute distress Head: atraumatic EENT: anicteric sclera Neck: supple, nontender Lungs:  Clear throughout to auscultation.   No wheezes, crackles, or rhonchi. No acute distress. Heart:  Regular rate and rhythm; no murmurs, clicks, rubs,  or gallops. Abdomen: soft, nontender, nondistended, +BS  Rectal:  Deferred   GI:  Lab Results: No results for input(s): WBC, HGB, HCT, PLT in the last 72 hours. BMET No results for input(s): NA, K, CL, CO2, GLUCOSE, BUN, CREATININE, CALCIUM in the last 72 hours. LFT No results for input(s): PROT, ALBUMIN, AST, ALT, ALKPHOS, BILITOT, BILIDIR, IBILI in the last 72 hours. PT/INR No results for input(s): LABPROT, INR in the last 72 hours.   Studies/Results: No results found.  Impression/Plan: 60 yo with CT evidence concerning for a colovaginal fistula who was placed on antibiotics for it. Surgery requesting colonoscopic evaluation prior to potential surgery in the near future. Defer antibiotics to primary team (Dr. Maisie Fus note reviewed). Will do a 2 day prep and plan for colonoscopy with Diprivan sedation on Monday July 3rd by one of my partners. Ideally needs colonoscopy as an inpt due to her dementia it will be very difficult to get adequately prepped as an outpt. Spoke with her daughter, Kathyjo Briere by phone and risks/benefits of colonoscopy discussed and she agrees to proceed. Clear liquid diet starting tomorrow.     LOS: 6 days    Arriyanna Mersch C.  12/07/2015, 2:32 PM  Pager [phone]  If no answer or after 5 PM call [phone]

## 2015-12-07 NOTE — Progress Notes (Addendum)
PROGRESS NOTE    [name]  WSF:[id number] DOB: [birth year] DOA: 12/01/2015  PCP: Jackie Plum, MD   Brief Narrative:  60 year old female with dementia, stroke, diverticulosis, diverticular abscess in 2009, asthma hypertension who presented to the hospital with abdominal pain. She was diagnosed with acute diverticulitis on 6/12 and was discharged on Cipro and Flagyl. 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 She declined surgery. She is currently on antibiotics. She was also found to have acute renal injury which is steadily improving.  Subjective: She has no complaints.    Assessment & Plan:   Principal Problem:   Colovaginal fistula -  incontinent of stool- dementia too severe for her to tell staff when she has the urge to urinate or have a BM -General surgery recommends outpatient follow-up and antibiotics for now - have spoken with her daughter who states she and her sisters would like her to have surgery here- will re-contact surgery - will get prep for colonoscopy which will be done on Monday.- Gen surgery will decide next week whether to do inpatient or outpt procedure after colonoscopy - will stop antibiotics today -  Active Problems:  AKI - ? Prerenal-improving- diarrhea improved as well -baseline creatinine 0.86-presented with a creatinine of 2.70 on 6/24 -Losartan on hold - recheck Cr in AM  Essential hypertension -BP has been quite elevated -Continue Norvasc and Toprol -  when necessary hydralazine IV -Change normal saline to half-normal saline -Added 3 times a day oral hydralazine  Dementia -Apparently lives alone and has a daughter taking care of her -On Aricept  Current smoker -Has nicotine patch   DVT prophylaxis: Heparin Code Status: Full code Family Communication: Dorris Carnes Disposition Plan: Skilled nursing facility when stable Consultants:   Gen. Surgery Procedures:    Antimicrobials:  Anti-infectives    Start      Dose/Rate Route Frequency Ordered Stop   12/03/15 2000  ciprofloxacin (CIPRO) tablet 500 mg     500 mg Oral 2 times daily 12/03/15 0930     12/03/15 1400  metroNIDAZOLE (FLAGYL) tablet 500 mg     500 mg Oral Every 8 hours 12/03/15 0930     12/02/15 0900  ciprofloxacin (CIPRO) IVPB 400 mg  Status:  Discontinued     400 mg 200 mL/hr over 60 Minutes Intravenous Every 12 hours 12/02/15 0831 12/03/15 0930   12/01/15 1330  metroNIDAZOLE (FLAGYL) IVPB 500 mg  Status:  Discontinued     500 mg 100 mL/hr over 60 Minutes Intravenous Every 8 hours 12/01/15 1220 12/03/15 0930   12/01/15 1330  vancomycin (VANCOCIN) 50 mg/mL oral solution 125 mg  Status:  Discontinued     125 mg Oral Every 6 hours 12/01/15 1220 12/02/15 0831       Objective: Filed Vitals:   12/06/15 0642 12/07/15 0200 12/07/15 0654 12/07/15 1502  BP: 160/72  156/64   Pulse: 62  64   Temp: 97.9 F (36.6 C)  98.1 F (36.7 C) 97.8 F (36.6 C)  TempSrc: Oral  Oral Oral  Resp: 16     Height:  5\' 2"  (1.575 m)    Weight: 121.7 kg (268 lb 4.8 oz)  123.5 kg (272 lb 4.3 oz)   SpO2: 97%  97%     Intake/Output Summary (Last 24 hours) at 12/07/15 1559 Last data filed at 12/07/15 0900  Gross per 24 hour  Intake   1140 ml  Output      0 ml  Net   1140  ml   Filed Weights   12/05/15 0534 12/06/15 0642 12/07/15 0654  Weight: 123 kg (271 lb 2.7 oz) 121.7 kg (268 lb 4.8 oz) 123.5 kg (272 lb 4.3 oz)    Examination: General exam: Appears comfortable  HEENT: PERRLA, oral mucosa moist, no sclera icterus or thrush Respiratory system: Clear to auscultation. Respiratory effort normal. Cardiovascular system: S1 & S2 heard, RRR.  No murmurs  Gastrointestinal system: Abdomen soft, non-tender, nondistended. Normal bowel sound. No organomegaly Central nervous system: Alert and oriented. No focal neurological deficits. Extremities: No cyanosis, clubbing or edema Skin: No rashes or ulcers Psychiatry: confused, calm    Data Reviewed: I have  personally reviewed following labs and imaging studies  CBC:  Recent Labs Lab 12/01/15 0705 12/02/15 1135  WBC 14.2* 6.1  NEUTROABS 11.8*  --   HGB 13.4 13.4  HCT 40.9 40.3  MCV 77.2* 76.3*  PLT 163 147*   Basic Metabolic Panel:  Recent Labs Lab 12/01/15 0705 12/02/15 1135 12/03/15 0945 12/04/15 0937  NA 143 141 142 143  K 4.4 3.8 3.3* 3.6  CL 115* 113* 113* 114*  CO2 GLUCOSE 114* 131* 123* 162*  BUN 41* 28* 31* 26*  CREATININE 2.70* 1.95* 1.80* 1.60*  CALCIUM 8.5* 8.4* 8.6* 8.7*   GFR: Estimated Creatinine Clearance: 46.3 mL/min (by C-G formula based on Cr of 1.6). Liver Function Tests:  Recent Labs Lab 12/01/15 0705 12/02/15 1135  AST 23 21  ALT 15 13*  ALKPHOS 56 49  BILITOT 0.6 0.8  PROT 7.1 6.6  ALBUMIN 3.6 3.3*    Recent Labs Lab 12/01/15 0739  LIPASE 31   No results for input(s): AMMONIA in the last 168 hours. Coagulation Profile: No results for input(s): INR, PROTIME in the last 168 hours. Cardiac Enzymes: No results for input(s): CKTOTAL, CKMB, CKMBINDEX, TROPONINI in the last 168 hours. BNP (last 3 results) No results for input(s): PROBNP in the last 8760 hours. HbA1C: No results for input(s): HGBA1C in the last 72 hours. CBG:  Recent Labs Lab 12/06/15 2055 12/07/15 0133 12/07/15 0626 12/07/15 0744 12/07/15 1144  GLUCAP 142* 109* 74 86 115*   Lipid Profile: No results for input(s): CHOL, HDL, LDLCALC, TRIG, CHOLHDL, LDLDIRECT in the last 72 hours. Thyroid Function Tests: No results for input(s): TSH, T4TOTAL, FREET4, T3FREE, THYROIDAB in the last 72 hours. Anemia Panel: No results for input(s): VITAMINB12, FOLATE, FERRITIN, TIBC, IRON, RETICCTPCT in the last 72 hours. Urine analysis:    Component Value Date/Time   COLORURINE YELLOW 12/01/2015 0638   APPEARANCEUR CLOUDY* 12/01/2015 0638   LABSPEC 1.016 12/01/2015 0638   PHURINE 5.0 12/01/2015 0638   GLUCOSEU NEGATIVE 12/01/2015 0638   HGBUR NEGATIVE  12/01/2015 0638   BILIRUBINUR NEGATIVE 12/01/2015 0638   KETONESUR NEGATIVE 12/01/2015 0638   PROTEINUR NEGATIVE 12/01/2015 0638   UROBILINOGEN 0.2 02/15/2015 1615   NITRITE NEGATIVE 12/01/2015 0638   LEUKOCYTESUR NEGATIVE 12/01/2015 0638   Sepsis Labs: (procalcitonin:4,lacticidven:4) ) Recent Results (from the past 240 hour(s))  Culture, blood (routine x 2)     Status: None   Collection Time: 12/01/15 12:55 PM  Result Value Ref Range Status   Specimen Description BLOOD LEFT ANTECUBITAL  Final   Special Requests BOTTLES DRAWN AEROBIC AND ANAEROBIC 5 CC EACH  Final   Culture   Final    NO GROWTH 5 DAYS Performed at [facility]    Report Status 12/06/2015 FINAL  Final  C difficile quick scan w PCR  reflex     Status: None   Collection Time: 12/01/15  3:23 PM  Result Value Ref Range Status   C Diff antigen NEGATIVE NEGATIVE Final   C Diff toxin NEGATIVE NEGATIVE Final   C Diff interpretation Negative for toxigenic C. difficile  Final  Gastrointestinal Panel by PCR , Stool     Status: None   Collection Time: 12/01/15  3:23 PM  Result Value Ref Range Status   Campylobacter species NOT DETECTED NOT DETECTED Final   Plesimonas shigelloides NOT DETECTED NOT DETECTED Final   Salmonella species NOT DETECTED NOT DETECTED Final   Yersinia enterocolitica NOT DETECTED NOT DETECTED Final   Vibrio species NOT DETECTED NOT DETECTED Final   Vibrio cholerae NOT DETECTED NOT DETECTED Final   Enteroaggregative E coli (EAEC) NOT DETECTED NOT DETECTED Final   Enteropathogenic E coli (EPEC) NOT DETECTED NOT DETECTED Final   Enterotoxigenic E coli (ETEC) NOT DETECTED NOT DETECTED Final   Shiga like toxin producing E coli (STEC) NOT DETECTED NOT DETECTED Final   E. coli O157 NOT DETECTED NOT DETECTED Final   Shigella/Enteroinvasive E coli (EIEC) NOT DETECTED NOT DETECTED Final   Cryptosporidium NOT DETECTED NOT DETECTED Final   Cyclospora cayetanensis NOT DETECTED NOT DETECTED  Final   Entamoeba histolytica NOT DETECTED NOT DETECTED Final   Giardia lamblia NOT DETECTED NOT DETECTED Final   Adenovirus F40/41 NOT DETECTED NOT DETECTED Final   Astrovirus NOT DETECTED NOT DETECTED Final   Norovirus GI/GII NOT DETECTED NOT DETECTED Final   Rotavirus A NOT DETECTED NOT DETECTED Final   Sapovirus (I, II, IV, and V) NOT DETECTED NOT DETECTED Final  Culture, blood (routine x 2)     Status: None   Collection Time: 12/02/15 11:33 AM  Result Value Ref Range Status   Specimen Description BLOOD BLOOD RIGHT HAND  Final   Special Requests IN PEDIATRIC BOTTLE 1CC  Final   Culture   Final    NO GROWTH 5 DAYS Performed at [facility]    Report Status 12/07/2015 FINAL  Final         Radiology Studies: No results found.    Scheduled Meds: . amLODipine  10 mg Oral Daily  . aspirin EC  81 mg Oral Daily  . atorvastatin  20 mg Oral q1800  . ciprofloxacin  500 mg Oral BID  . donepezil  5 mg Oral QHS  . heparin  5,000 Units Subcutaneous Q8H  . hydrALAZINE  50 mg Oral Q8H  . hydrOXYzine  25 mg Oral QHS  . metoprolol succinate  100 mg Oral Daily  . metroNIDAZOLE  500 mg Oral Q8H  . nicotine  7 mg Transdermal Daily  . [START ON 12/08/2015] polyethylene glycol-electrolytes  4,000 mL Oral Once  . [START ON 12/09/2015] polyethylene glycol-electrolytes  4,000 mL Oral Once  . QUEtiapine  12.5 mg Oral QHS   Continuous Infusions: . sodium chloride 75 mL/hr at 12/06/15 2249  . sodium chloride       LOS: 6 days    Time spent in minutes: 35    Gemini Beaumier, MD [facility] Pager: [email or web address] Password [email or web address] 12/07/2015, 3:59 PM

## 2015-12-08 LAB — CBC
HCT: 39.8 % (ref 36.0–46.0)
HEMOGLOBIN: 12.8 g/dL (ref 12.0–15.0)
MCH: 25.1 pg — AB (ref 26.0–34.0)
MCHC: 32.2 g/dL (ref 30.0–36.0)
MCV: 78 fL (ref 78.0–100.0)
PLATELETS: 157 10*3/uL (ref 150–400)
RBC: 5.1 MIL/uL (ref 3.87–5.11)
RDW: 17.1 % — AB (ref 11.5–15.5)
WBC: 7 10*3/uL (ref 4.0–10.5)

## 2015-12-08 LAB — BASIC METABOLIC PANEL
Anion gap: 6 (ref 5–15)
BUN: 22 mg/dL — AB (ref 6–20)
CALCIUM: 8.7 mg/dL — AB (ref 8.9–10.3)
CO2: 23 mmol/L (ref 22–32)
CREATININE: 1.15 mg/dL — AB (ref 0.44–1.00)
Chloride: 111 mmol/L (ref 101–111)
GFR calc Af Amer: 58 mL/min — ABNORMAL LOW (ref >60)
GFR, EST NON AFRICAN AMERICAN: 50 mL/min — AB (ref >60)
GLUCOSE: 91 mg/dL (ref 65–99)
Potassium: 3.6 mmol/L (ref 3.5–5.1)
Sodium: 140 mmol/L (ref 135–145)

## 2015-12-08 LAB — GLUCOSE, CAPILLARY
GLUCOSE-CAPILLARY: 108 mg/dL — AB (ref 65–99)
GLUCOSE-CAPILLARY: 84 mg/dL (ref 65–99)
Glucose-Capillary: 102 mg/dL — ABNORMAL HIGH (ref 65–99)
Glucose-Capillary: 77 mg/dL (ref 65–99)

## 2015-12-08 NOTE — Progress Notes (Signed)
Patient discussed with Dr. Butler Denmark- pt will undergo a colonoscopy on Monday. SW will continue to monitor for stability and placement at [facility] when medically stable per MD.  Lupita Leash T. Jaci Lazier, [place] [phone] (weekend coverage)

## 2015-12-08 NOTE — Progress Notes (Addendum)
PROGRESS NOTE    [name]  ZOX:[id number] DOB: 06/03/1956 DOA: 12/01/2015  PCP: Jackie Plum, MD   Brief Narrative:  60 year old female with dementia, stroke, diverticulosis, diverticular abscess in 2009, asthma hypertension who presented to the hospital with abdominal pain. She was diagnosed with acute diverticulitis on 6/12 and was discharged on Cipro and Flagyl. 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 She declined surgery. She is currently on antibiotics. She was also found to have acute renal injury which is steadily improving.  Subjective: No complaints.    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 - h/o abscess in the past as well -  incontinent of stool- dementia too severe for her to tell staff when she has the urge to urinate or have a BM and essentially sits in her stool until checked  -General surgery consult on 6/24-- recommended outpatient follow-up for end colostomy and antibiotics to be given for now - I spoke with her daughter who states she and her sisters would like her to have surgery here- I re-contacted surgery - surgery re consult 6/30 - recommended to stopped antibiotics and obtain colonoscopy prior to surgery - antibiotics stopped on 6/30- - will get prep for colonoscopy which will be done on Monday.- Gen surgery will decide next week whether to do inpatient or outpt colostomy after colonoscopy  advised staff to get her out of bed to a commode QID to ensure she does not develop skin breakdown  Active Problems:  AKI - ? Prerenal-improving- diarrhea improved as well -baseline creatinine 0.86-presented with a creatinine of 2.70 on 6/24 -Losartan on hold  - maintaining on 1/2 NS at 75 cc/hr as she spend most of her day sleeping and is not drinking fluid to match her losses  Essential hypertension -BP has been quite elevated -Continue Norvasc and Toprol -  when necessary hydralazine IV -Change  normal saline to half-normal saline -Added 3 times a day oral hydralazine  Dementia -Apparently lives alone and has a daughter taking care of her -On Aricept  Current smoker -Has nicotine patch   DVT prophylaxis: Heparin Code Status: Full code Family Communication: Dorris Carnes Disposition Plan: due to dementia, refuses PT and refuses SNF- family would like her to go to SNF Consultants:   Gen. Surgery Procedures:    Antimicrobials:  Anti-infectives    Start     Dose/Rate Route Frequency Ordered Stop   12/03/15 2000  ciprofloxacin (CIPRO) tablet 500 mg  Status:  Discontinued     500 mg Oral 2 times daily 12/03/15 0930 12/07/15 1602   12/03/15 1400  metroNIDAZOLE (FLAGYL) tablet 500 mg  Status:  Discontinued     500 mg Oral Every 8 hours 12/03/15 0930 12/07/15 1602   12/02/15 0900  ciprofloxacin (CIPRO) IVPB 400 mg  Status:  Discontinued     400 mg 200 mL/hr over 60 Minutes Intravenous Every 12 hours 12/02/15 0831 12/03/15 0930   12/01/15 1330  metroNIDAZOLE (FLAGYL) IVPB 500 mg  Status:  Discontinued     500 mg 100 mL/hr over 60 Minutes Intravenous Every 8 hours 12/01/15 1220 12/03/15 0930   12/01/15 1330  vancomycin (VANCOCIN) 50 mg/mL oral solution 125 mg  Status:  Discontinued     125 mg Oral Every 6 hours 12/01/15 1220 12/02/15 0831       Objective: Filed Vitals:   12/08/15 0500 12/08/15 0603 12/08/15 0620 12/08/15 0948  BP:  193/76 193/76 190/84  Pulse:  75 65  Temp:   97.8 F (36.6 C)   TempSrc:   Oral   Resp:      Height:      Weight: 123.1 kg (271 lb 6.2 oz)     SpO2:   100%     Intake/Output Summary (Last 24 hours) at 12/08/15 1312 Last data filed at 12/08/15 1300  Gross per 24 hour  Intake   1005 ml  Output      0 ml  Net   1005 ml   Filed Weights   12/06/15 0642 12/07/15 0654 12/08/15 0500  Weight: 121.7 kg (268 lb 4.8 oz) 123.5 kg (272 lb 4.3 oz) 123.1 kg (271 lb 6.2 oz)    Examination: General exam: Appears comfortable  HEENT: PERRLA,  oral mucosa moist, no sclera icterus or thrush Respiratory system: Clear to auscultation. Respiratory effort normal. Cardiovascular system: S1 & S2 heard, RRR.  No murmurs  Gastrointestinal system: Abdomen soft, non-tender, nondistended. Normal bowel sound. No organomegaly Central nervous system: Alert and oriented. No focal neurological deficits. Extremities: No cyanosis, clubbing or edema Skin: No rashes or ulcers Psychiatry: confused, calm    Data Reviewed: I have personally reviewed following labs and imaging studies  CBC:  Recent Labs Lab 12/02/15 1135 12/08/15 0557  WBC 6.1 7.0  HGB 13.4 12.8  HCT 40.3 39.8  MCV 76.3* 78.0  PLT 147* 157   Basic Metabolic Panel:  Recent Labs Lab 12/02/15 1135 12/03/15 0945 12/04/15 0937 12/08/15 0557  NA 141 142 143 140  K 3.8 3.3* 3.6 3.6  CL 113* 113* 114* 111  CO2 22 22 22 23   GLUCOSE 131* 123* 162* 91  BUN 28* 31* 26* 22*  CREATININE 1.95* 1.80* 1.60* 1.15*  CALCIUM 8.4* 8.6* 8.7* 8.7*   GFR: Estimated Creatinine Clearance: 64.3 mL/min (by C-G formula based on Cr of 1.15). Liver Function Tests:  Recent Labs Lab 12/02/15 1135  AST 21  ALT 13*  ALKPHOS 49  BILITOT 0.8  PROT 6.6  ALBUMIN 3.3*   No results for input(s): LIPASE, AMYLASE in the last 168 hours. No results for input(s): AMMONIA in the last 168 hours. Coagulation Profile: No results for input(s): INR, PROTIME in the last 168 hours. Cardiac Enzymes: No results for input(s): CKTOTAL, CKMB, CKMBINDEX, TROPONINI in the last 168 hours. BNP (last 3 results) No results for input(s): PROBNP in the last 8760 hours. HbA1C: No results for input(s): HGBA1C in the last 72 hours. CBG:  Recent Labs Lab 12/07/15 1716 12/07/15 2120 12/08/15 0028 12/08/15 0605 12/08/15 1127  GLUCAP 135* 104* 102* 77 108*   Lipid Profile: No results for input(s): CHOL, HDL, LDLCALC, TRIG, CHOLHDL, LDLDIRECT in the last 72 hours. Thyroid Function Tests: No results for  input(s): TSH, T4TOTAL, FREET4, T3FREE, THYROIDAB in the last 72 hours. Anemia Panel: No results for input(s): VITAMINB12, FOLATE, FERRITIN, TIBC, IRON, RETICCTPCT in the last 72 hours. Urine analysis:    Component Value Date/Time   COLORURINE YELLOW 12/01/2015 0638   APPEARANCEUR CLOUDY* 12/01/2015 0638   LABSPEC 1.016 12/01/2015 0638   PHURINE 5.0 12/01/2015 0638   GLUCOSEU NEGATIVE 12/01/2015 0638   HGBUR NEGATIVE 12/01/2015 0638   BILIRUBINUR NEGATIVE 12/01/2015 0638   KETONESUR NEGATIVE 12/01/2015 0638   PROTEINUR NEGATIVE 12/01/2015 0638   UROBILINOGEN 0.2 02/15/2015 1615   NITRITE NEGATIVE 12/01/2015 0638   LEUKOCYTESUR NEGATIVE 12/01/2015 9628   Sepsis Labs: @LABRCNTIP (procalcitonin:4,lacticidven:4) ) Recent Results (from the past 240 hour(s))  Culture, blood (routine x 2)  Status: None   Collection Time: 12/01/15 12:55 PM  Result Value Ref Range Status   Specimen Description BLOOD LEFT ANTECUBITAL  Final   Special Requests BOTTLES DRAWN AEROBIC AND ANAEROBIC 5 CC EACH  Final   Culture   Final    NO GROWTH 5 DAYS Performed at [facility]    Report Status 12/06/2015 FINAL  Final  C difficile quick scan w PCR reflex     Status: None   Collection Time: 12/01/15  3:23 PM  Result Value Ref Range Status   C Diff antigen NEGATIVE NEGATIVE Final   C Diff toxin NEGATIVE NEGATIVE Final   C Diff interpretation Negative for toxigenic C. difficile  Final  Gastrointestinal Panel by PCR , Stool     Status: None   Collection Time: 12/01/15  3:23 PM  Result Value Ref Range Status   Campylobacter species NOT DETECTED NOT DETECTED Final   Plesimonas shigelloides NOT DETECTED NOT DETECTED Final   Salmonella species NOT DETECTED NOT DETECTED Final   Yersinia enterocolitica NOT DETECTED NOT DETECTED Final   Vibrio species NOT DETECTED NOT DETECTED Final   Vibrio cholerae NOT DETECTED NOT DETECTED Final   Enteroaggregative E coli (EAEC) NOT DETECTED NOT DETECTED Final    Enteropathogenic E coli (EPEC) NOT DETECTED NOT DETECTED Final   Enterotoxigenic E coli (ETEC) NOT DETECTED NOT DETECTED Final   Shiga like toxin producing E coli (STEC) NOT DETECTED NOT DETECTED Final   E. coli O157 NOT DETECTED NOT DETECTED Final   Shigella/Enteroinvasive E coli (EIEC) NOT DETECTED NOT DETECTED Final   Cryptosporidium NOT DETECTED NOT DETECTED Final   Cyclospora cayetanensis NOT DETECTED NOT DETECTED Final   Entamoeba histolytica NOT DETECTED NOT DETECTED Final   Giardia lamblia NOT DETECTED NOT DETECTED Final   Adenovirus F40/41 NOT DETECTED NOT DETECTED Final   Astrovirus NOT DETECTED NOT DETECTED Final   Norovirus GI/GII NOT DETECTED NOT DETECTED Final   Rotavirus A NOT DETECTED NOT DETECTED Final   Sapovirus (I, II, IV, and V) NOT DETECTED NOT DETECTED Final  Culture, blood (routine x 2)     Status: None   Collection Time: 12/02/15 11:33 AM  Result Value Ref Range Status   Specimen Description BLOOD BLOOD RIGHT HAND  Final   Special Requests IN PEDIATRIC BOTTLE 1CC  Final   Culture   Final    NO GROWTH 5 DAYS Performed at [facility]    Report Status 12/07/2015 FINAL  Final         Radiology Studies: No results found.    Scheduled Meds: . amLODipine  10 mg Oral Daily  . aspirin EC  81 mg Oral Daily  . atorvastatin  20 mg Oral q1800  . donepezil  5 mg Oral QHS  . heparin  5,000 Units Subcutaneous Q8H  . hydrALAZINE  50 mg Oral Q8H  . hydrOXYzine  25 mg Oral QHS  . metoprolol succinate  100 mg Oral Daily  . nicotine  7 mg Transdermal Daily  . polyethylene glycol-electrolytes  4,000 mL Oral Once  . [START ON 12/09/2015] polyethylene glycol-electrolytes  4,000 mL Oral Once  . QUEtiapine  12.5 mg Oral QHS   Continuous Infusions: . sodium chloride 75 mL/hr at 12/06/15 2249  . sodium chloride       LOS: 7 days    Time spent in minutes: 35    Magdalyn Arenivas, MD [facility] Pager: [email or web address] Password [email or web address] 12/08/2015, 1:12 PM

## 2015-12-09 LAB — GLUCOSE, CAPILLARY
GLUCOSE-CAPILLARY: 90 mg/dL (ref 65–99)
GLUCOSE-CAPILLARY: 79 mg/dL (ref 65–99)
Glucose-Capillary: 84 mg/dL (ref 65–99)
Glucose-Capillary: 106 mg/dL — ABNORMAL HIGH (ref 65–99)

## 2015-12-09 NOTE — Progress Notes (Signed)
PROGRESS NOTE    [name]  ZOX:[id number] DOB: 25-Jul-1955 DOA: 12/01/2015  PCP: Robin Plum, MD   Brief Narrative:  60 year old female with dementia, stroke, diverticulosis, diverticular abscess in 2009, asthma hypertension who presented to the hospital with abdominal pain. She was diagnosed with acute diverticulitis on 6/12 and was discharged on Cipro and Flagyl. 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 She declined surgery. She is currently on antibiotics. She was also found to have acute renal injury which is steadily improving.  Subjective: No complaints.    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 - h/o abscess in the past as well -  incontinent of stool- dementia too severe for her to tell staff when she has the urge to urinate or have a BM and essentially sits in her stool until checked  -General surgery consult on 6/24-- recommended outpatient follow-up for end colostomy and antibiotics to be given for now - I spoke with her daughter who states she and her sisters would like her to have surgery here- I re-contacted surgery - surgery re consult 6/30 - recommended to stopped antibiotics and obtain colonoscopy prior to surgery - antibiotics stopped on 6/30- - will get prep for colonoscopy which will be done tomorrow- drinking prep - Gen surgery will decide next week whether to do inpatient or outpt colostomy after colonoscopy  advised staff to get her out of bed to a commode QID to ensure she does not develop skin breakdown  Active Problems:  AKI - ? Prerenal-improving- diarrhea improved as well -baseline creatinine 0.86-presented with a creatinine of 2.70 on 6/24 -Losartan on hold  - maintaining on 1/2 NS at 75 cc/hr as she spend most of her day sleeping and is not drinking fluid to match her losses  Essential hypertension -BP has been quite elevated -Continue Norvasc and Toprol -  when necessary hydralazine  IV -Change normal saline to half-normal saline -Added 3 times a day oral hydralazine  Dementia -Apparently lives alone and has a daughter taking care of her -On Aricept  Current smoker -Has nicotine patch   DVT prophylaxis: Heparin Code Status: Full code Family Communication: [name] Disposition Plan: due to dementia, refuses PT and refuses SNF- family would like her to go to SNF Consultants:   Gen. Surgery Procedures:    Antimicrobials:  Anti-infectives    Start     Dose/Rate Route Frequency Ordered Stop   12/03/15 2000  ciprofloxacin (CIPRO) tablet 500 mg  Status:  Discontinued     500 mg Oral 2 times daily 12/03/15 0930 12/07/15 1602   12/03/15 1400  metroNIDAZOLE (FLAGYL) tablet 500 mg  Status:  Discontinued     500 mg Oral Every 8 hours 12/03/15 0930 12/07/15 1602   12/02/15 0900  ciprofloxacin (CIPRO) IVPB 400 mg  Status:  Discontinued     400 mg 200 mL/hr over 60 Minutes Intravenous Every 12 hours 12/02/15 0831 12/03/15 0930   12/01/15 1330  metroNIDAZOLE (FLAGYL) IVPB 500 mg  Status:  Discontinued     500 mg 100 mL/hr over 60 Minutes Intravenous Every 8 hours 12/01/15 1220 12/03/15 0930   12/01/15 1330  vancomycin (VANCOCIN) 50 mg/mL oral solution 125 mg  Status:  Discontinued     125 mg Oral Every 6 hours 12/01/15 1220 12/02/15 0831       Objective: Filed Vitals:   12/08/15 2151 12/08/15 2346 12/09/15 0512 12/09/15 0652  BP: 182/77 186/84 176/64  Pulse: 51  49 49  Temp: 98 F (36.7 C)  98 F (36.7 C)   TempSrc: Oral  Axillary   Resp:   22 18  Height:      Weight:   121 kg (266 lb 12.1 oz)   SpO2: 99%  98% 98%    Intake/Output Summary (Last 24 hours) at 12/09/15 1518 Last data filed at 12/08/15 1800  Gross per 24 hour  Intake    225 ml  Output      0 ml  Net    225 ml   Filed Weights   12/07/15 0654 12/08/15 0500 12/09/15 0512  Weight: 123.5 kg (272 lb 4.3 oz) 123.1 kg (271 lb 6.2 oz) 121 kg (266 lb 12.1 oz)    Examination: General  exam: Appears comfortable  HEENT: PERRLA, oral mucosa moist, no sclera icterus or thrush Respiratory system: Clear to auscultation. Respiratory effort normal. Cardiovascular system: S1 & S2 heard, RRR.  No murmurs  Gastrointestinal system: Abdomen soft, non-tender, nondistended. Normal bowel sound. No organomegaly Central nervous system: Alert and oriented. No focal neurological deficits. Extremities: No cyanosis, clubbing or edema Skin: No rashes or ulcers Psychiatry: confused, calm    Data Reviewed: I have personally reviewed following labs and imaging studies  CBC:  Recent Labs Lab 12/08/15 0557  WBC 7.0  HGB 12.8  HCT 39.8  MCV 78.0  PLT 157   Basic Metabolic Panel:  Recent Labs Lab 12/03/15 0945 12/04/15 0937 12/08/15 0557  NA 142 143 140  K 3.3* 3.6 3.6  CL 113* 114* 111  CO2 GLUCOSE 123* 162* 91  BUN 31* 26* 22*  CREATININE 1.80* 1.60* 1.15*  CALCIUM 8.6* 8.7* 8.7*   GFR: Estimated Creatinine Clearance: 63.7 mL/min (by C-G formula based on Cr of 1.15). Liver Function Tests: No results for input(s): AST, ALT, ALKPHOS, BILITOT, PROT, ALBUMIN in the last 168 hours. No results for input(s): LIPASE, AMYLASE in the last 168 hours. No results for input(s): AMMONIA in the last 168 hours. Coagulation Profile: No results for input(s): INR, PROTIME in the last 168 hours. Cardiac Enzymes: No results for input(s): CKTOTAL, CKMB, CKMBINDEX, TROPONINI in the last 168 hours. BNP (last 3 results) No results for input(s): PROBNP in the last 8760 hours. HbA1C: No results for input(s): HGBA1C in the last 72 hours. CBG:  Recent Labs Lab 12/08/15 1127 12/08/15 1748 12/09/15 0642 12/09/15 0752 12/09/15 1513  GLUCAP 108* 84 90 79 84   Lipid Profile: No results for input(s): CHOL, HDL, LDLCALC, TRIG, CHOLHDL, LDLDIRECT in the last 72 hours. Thyroid Function Tests: No results for input(s): TSH, T4TOTAL, FREET4, T3FREE, THYROIDAB in the last 72  hours. Anemia Panel: No results for input(s): VITAMINB12, FOLATE, FERRITIN, TIBC, IRON, RETICCTPCT in the last 72 hours. Urine analysis:    Component Value Date/Time   COLORURINE YELLOW 12/01/2015 0638   APPEARANCEUR CLOUDY* 12/01/2015 0638   LABSPEC 1.016 12/01/2015 0638   PHURINE 5.0 12/01/2015 0638   GLUCOSEU NEGATIVE 12/01/2015 0638   HGBUR NEGATIVE 12/01/2015 0638   BILIRUBINUR NEGATIVE 12/01/2015 0638   KETONESUR NEGATIVE 12/01/2015 0638   PROTEINUR NEGATIVE 12/01/2015 0638   UROBILINOGEN 0.2 02/15/2015 1615   NITRITE NEGATIVE 12/01/2015 0638   LEUKOCYTESUR NEGATIVE 12/01/2015 0638   Sepsis Labs: (procalcitonin:4,lacticidven:4) ) Recent Results (from the past 240 hour(s))  Culture, blood (routine x 2)     Status: None   Collection Time: 12/01/15 12:55 PM  Result Value Ref Range Status   Specimen  Description BLOOD LEFT ANTECUBITAL  Final   Special Requests BOTTLES DRAWN AEROBIC AND ANAEROBIC 5 CC EACH  Final   Culture   Final    NO GROWTH 5 DAYS Performed at [facility]    Report Status 12/06/2015 FINAL  Final  C difficile quick scan w PCR reflex     Status: None   Collection Time: 12/01/15  3:23 PM  Result Value Ref Range Status   C Diff antigen NEGATIVE NEGATIVE Final   C Diff toxin NEGATIVE NEGATIVE Final   C Diff interpretation Negative for toxigenic C. difficile  Final  Gastrointestinal Panel by PCR , Stool     Status: None   Collection Time: 12/01/15  3:23 PM  Result Value Ref Range Status   Campylobacter species NOT DETECTED NOT DETECTED Final   Plesimonas shigelloides NOT DETECTED NOT DETECTED Final   Salmonella species NOT DETECTED NOT DETECTED Final   Yersinia enterocolitica NOT DETECTED NOT DETECTED Final   Vibrio species NOT DETECTED NOT DETECTED Final   Vibrio cholerae NOT DETECTED NOT DETECTED Final   Enteroaggregative E coli (EAEC) NOT DETECTED NOT DETECTED Final   Enteropathogenic E coli (EPEC) NOT DETECTED NOT DETECTED Final    Enterotoxigenic E coli (ETEC) NOT DETECTED NOT DETECTED Final   Shiga like toxin producing E coli (STEC) NOT DETECTED NOT DETECTED Final   E. coli O157 NOT DETECTED NOT DETECTED Final   Shigella/Enteroinvasive E coli (EIEC) NOT DETECTED NOT DETECTED Final   Cryptosporidium NOT DETECTED NOT DETECTED Final   Cyclospora cayetanensis NOT DETECTED NOT DETECTED Final   Entamoeba histolytica NOT DETECTED NOT DETECTED Final   Giardia lamblia NOT DETECTED NOT DETECTED Final   Adenovirus F40/41 NOT DETECTED NOT DETECTED Final   Astrovirus NOT DETECTED NOT DETECTED Final   Norovirus GI/GII NOT DETECTED NOT DETECTED Final   Rotavirus A NOT DETECTED NOT DETECTED Final   Sapovirus (I, II, IV, and V) NOT DETECTED NOT DETECTED Final  Culture, blood (routine x 2)     Status: None   Collection Time: 12/02/15 11:33 AM  Result Value Ref Range Status   Specimen Description BLOOD BLOOD RIGHT HAND  Final   Special Requests IN PEDIATRIC BOTTLE 1CC  Final   Culture   Final    NO GROWTH 5 DAYS Performed at [facility]    Report Status 12/07/2015 FINAL  Final         Radiology Studies: No results found.    Scheduled Meds: . amLODipine  10 mg Oral Daily  . aspirin EC  81 mg Oral Daily  . atorvastatin  20 mg Oral q1800  . donepezil  5 mg Oral QHS  . heparin  5,000 Units Subcutaneous Q8H  . hydrALAZINE  50 mg Oral Q8H  . hydrOXYzine  25 mg Oral QHS  . metoprolol succinate  100 mg Oral Daily  . nicotine  7 mg Transdermal Daily  . polyethylene glycol-electrolytes  4,000 mL Oral Once  . QUEtiapine  12.5 mg Oral QHS   Continuous Infusions: . sodium chloride 75 mL/hr at 12/09/15 0850  . sodium chloride       LOS: 8 days    Time spent in minutes: 35    Robin Kraker, MD [facility] Pager: [email or web address] Password [facility] 12/09/2015, 3:18 PM

## 2015-12-09 NOTE — Progress Notes (Signed)
Pt educated on use of golyte solution and need to drink complete container. Pt verbalizes understanding but states " I cannot drink that whole thing". Nursing assisted advised that pt will need assistance.

## 2015-12-09 NOTE — Progress Notes (Signed)
Patient's heart rate 48 without symptoms notified L. Harduk of finding, advised to hold am beta blocker. Will continue to to monitor and update day shift nurse.

## 2015-12-09 NOTE — Progress Notes (Signed)
Patient ID: [name], female   DOB: Mar 19, 1956, 60 y.o.   MRN: [id number]   Patient has drunk less than half of a gallon that was given yesterday and states she cannot drink anymore. Upon further discussion she is not opposed to drinking more and was encouraged to finish the gallon. If she is not adequately cleaned out, then may need to do a flex sig instead of a colonoscopy to look at the sigmoid colon, which is the area of concern for the colovaginal fistula in prep for possible surgery in the future. NPO p MN for colonoscopy vs flex sig. Discussed with nursing importance of encouraging her to finish the prep.

## 2015-12-10 ENCOUNTER — Encounter (HOSPITAL_COMMUNITY)
Admission: EM | Disposition: A | Discharge status: Skilled Nursing Facility | Payer: Self-pay | Source: Home / Self Care | Attending: Internal Medicine

## 2015-12-10 ENCOUNTER — Encounter (HOSPITAL_COMMUNITY): Payer: Self-pay | Admitting: Gastroenterology

## 2015-12-10 HISTORY — PX: FLEXIBLE SIGMOIDOSCOPY: SHX5431

## 2015-12-10 LAB — BASIC METABOLIC PANEL
ANION GAP: 9 (ref 5–15)
BUN: 13 mg/dL (ref 6–20)
CALCIUM: 8.8 mg/dL — AB (ref 8.9–10.3)
CO2: 22 mmol/L (ref 22–32)
CREATININE: 1.01 mg/dL — AB (ref 0.44–1.00)
Chloride: 109 mmol/L (ref 101–111)
GFR calc non Af Amer: 59 mL/min — ABNORMAL LOW (ref >60)
GLUCOSE: 86 mg/dL (ref 65–99)
POTASSIUM: 3.9 mmol/L (ref 3.5–5.1)
Sodium: 140 mmol/L (ref 135–145)

## 2015-12-10 LAB — GLUCOSE, CAPILLARY
GLUCOSE-CAPILLARY: 90 mg/dL (ref 65–99)
GLUCOSE-CAPILLARY: 97 mg/dL (ref 65–99)
GLUCOSE-CAPILLARY: 137 mg/dL — AB (ref 65–99)
GLUCOSE-CAPILLARY: 124 mg/dL — AB (ref 65–99)
Glucose-Capillary: 87 mg/dL (ref 65–99)

## 2015-12-10 LAB — SURGICAL PCR SCREEN
MRSA, PCR: NEGATIVE
Staphylococcus aureus: NEGATIVE

## 2015-12-10 SURGERY — SIGMOIDOSCOPY, FLEXIBLE
Anesthesia: Moderate Sedation

## 2015-12-10 MED ORDER — MIDAZOLAM HCL 10 MG/2ML IJ SOLN
INTRAMUSCULAR | Status: DC | PRN
  Administered 2015-12-10 (×2): 1 mg via INTRAVENOUS

## 2015-12-10 MED ORDER — DIPHENHYDRAMINE HCL 50 MG/ML IJ SOLN
INTRAMUSCULAR | Status: AC
  Filled 2015-12-10: qty 1

## 2015-12-10 MED ORDER — FLEET ENEMA 7-19 GM/118ML RE ENEM
1.0000 | ENEMA | Freq: Once | RECTAL | Status: AC
  Administered 2015-12-10: 1 via RECTAL
  Filled 2015-12-10: qty 1

## 2015-12-10 MED ORDER — FENTANYL CITRATE (PF) 100 MCG/2ML IJ SOLN
INTRAMUSCULAR | Status: AC
  Filled 2015-12-10: qty 2

## 2015-12-10 MED ORDER — MIDAZOLAM HCL 5 MG/ML IJ SOLN
INTRAMUSCULAR | Status: AC
  Filled 2015-12-10: qty 2

## 2015-12-10 SURGICAL SUPPLY — 21 items

## 2015-12-10 NOTE — Interval H&P Note (Signed)
History and Physical Interval Note:  12/10/2015 10:08 AM  [name]  has presented today for surgery, with the diagnosis of colovaginal fistula  The various methods of treatment have been discussed with the patient and family (dtr Amy). After consideration of risks and benefits, the patient has consented to  Procedure(s): COLONOSCOPY  as a surgical intervention .  The patient's history has been reviewed, patient examined, no change in status, stable for surgery.  I have reviewed the patient's chart and labs.  Questions were answered to the patient's satisfaction.  The patient seems to have reasonable understanding, but I had the patient's daughter, Amy, co-sign the permit after a discussion of the nature, purpose, and risks of the procedure.   [name]

## 2015-12-10 NOTE — Op Note (Signed)
[facility] Patient Name: [name] Procedure Date: 12/10/2015 MRN: [id number] Attending MD: Bernette Redbird , MD Date of Birth: 04/14/1956 CSN: [id number] Age: 60 Admit Type: Inpatient Procedure:                Colonoscopy (attempted) Indications:              Colo-vaginal fistula (stool per vagina) Providers:                Bernette Redbird, MD, Roselie Awkward, RN, Lorenda Ishihara,                            Technician Referring MD:             Romie Levee, MD Medicines:                Midazolam 2 mg IV Complications:            No immediate complications. Estimated Blood Loss:     Estimated blood loss: none. Procedure:                Pre-Anesthesia Assessment:                           - Prior to the procedure, a History and Physical                            was performed, and patient medications and                            allergies were reviewed. The patient's tolerance of                            previous anesthesia was also reviewed. The risks                            and benefits of the procedure and the sedation                            options and risks were discussed with the patient                            and her daughter. All questions were answered, and                            informed consent was obtained. Prior                            Anticoagulants: The patient has taken no previous                            anticoagulant or antiplatelet agents. ASA Grade                            Assessment: III - A patient with severe systemic  disease. After reviewing the risks and benefits,                            the patient was deemed in satisfactory condition to                            undergo the procedure.                           After obtaining informed consent, the colonoscope                            was passed under direct vision. Throughout the                            procedure, the patient's  blood pressure, pulse, and                            oxygen saturations were monitored continuously. The                            EC-[id number] ([id number]) scope was introduced through                            the anus and advanced to the the sigmoid colon. The                            colonoscopy was technically difficult and complex                            due to restricted mobility of the colon and the                            patient's discomfort during the procedure, which                            prevented passage beyond approximately 40 cm of                            insertion (30 cm as measured during pullback). The                            quality of the bowel preparation was good. The                            patient tolerated the procedure fairly well. No                            anatomical landmarks were photographed. Scope In: Scope Out: Findings:      The perianal examination was normal, specifically without skin tags,       prolapsed hemorrhoids, or overt perianal fistulae.      A few small-mouthed diverticula were found in the sigmoid colon. These  diverticula were not extremely numerous.      The mucosa appeared healthy, specifically without evidence of       inflammation or Crohn's disease.      There was severe spasm, and moderately severe fixation of the colon,       perhaps as a consequence of her prior hysterectomy. Because of this, and       patient discomfort, further advancement during this procedure was not       feasible.      The exam was otherwise normal throughout the examined colon.       Specifically, there was no evidence of polyps or masses up to the limit       of the exam, which is felt to be the mid-to-proximal portion of the       sigmoid colon.      Moreover, there was no obvious fistulous opening noted within the       colonic lumen.      Retroflexion was not able to be accomplished in the rectum due to a       small rectal  ampulla, but careful antegrade viewing disclosed no distal       rectal pathology. Impression:               - Diverticulosis in the sigmoid colon. No mass,                            Crohn's disease, or inflammatory change noted up to                            the limit of the exam (proximal sigmoid region),                            suggesting that the patient's presumed colovaginal                            fistula is probably of diverticular origin.                           - No specimens collected. Moderate Sedation:      Moderate (conscious) sedation was administered by the endoscopy nurse       and supervised by the endoscopist. The following parameters were       monitored: oxygen saturation, heart rate, blood pressure, and response       to care. Total physician intraservice time was 8 minutes. Recommendation:           - Follow-up with surgeon at appointment to be                            scheduled.                           - Resume previous diet.                           - Continue present medications.                           - Repeat colonoscopy is not recommended for  surveillance. Procedure Code(s):        --- Professional ---                           [phone], 53, Colonoscopy, flexible; diagnostic,                            including collection of specimen(s) by brushing or                            washing, when performed (separate procedure) Diagnosis Code(s):        --- Professional ---                           N82.3, Fistula of vagina to large intestine                           K57.30, Diverticulosis of large intestine without                            perforation or abscess without bleeding CPT copyright 2016 [employer]. All rights reserved. The codes documented in this report are preliminary and upon coder review may  be revised to meet current compliance requirements. Bernette Redbird, MD 12/10/2015 10:52:36  AM This report has been signed electronically. Number of Addenda: 0

## 2015-12-10 NOTE — Progress Notes (Signed)
Unable to do full colonoscopy because of severe fixation of the sigmoid region, as well as sigmoid spasm and patient discomfort.  I was able to reach the proximal sigmoid region, and noted moderate diverticulosis, without other abnormalities. Please see dictated procedure report for further details.  Findings discussed with Dr. Romie Levee. I would favor surgical follow-up as previously planned; although we were not able to accomplish a complete exam today, if it was felt to be strongly indicated by the surgeon so as to rule out more proximal colonic pathology, a repeat trial of a full colonoscopy could be considered (perhaps immediately preoperatively), if done under MAC sedation following a complete prep.  We will sign off at this time. Please call if we can be of further assistance in this patient's care.  [name], M.D. Pager [phone] If no answer or after 5 PM call [phone]

## 2015-12-10 NOTE — Progress Notes (Signed)
OT Note  Pt having procedure.  Will recheck on pt at a later time  ThanksLise Auer, [place] [phone]

## 2015-12-10 NOTE — Progress Notes (Signed)
PROGRESS NOTE    [name]  WUJ:[id number] DOB: 11-02-1955 DOA: 12/01/2015  PCP: Jackie Plum, MD   Brief Narrative:  60 year old female with dementia, stroke, diverticulosis, diverticular abscess in 2009, asthma hypertension who presented to the hospital with abdominal pain. She was diagnosed with acute diverticulitis on 6/12 and was discharged on Cipro and Flagyl. 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 She declined surgery. She is currently on antibiotics. She was also found to have acute renal injury which is steadily improving.  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 - h/o abscess in the past as well -  incontinent of stool- dementia too severe for her to tell staff when she has the urge to urinate or have a BM and essentially sits in her stool until checked  -General surgery consult on 6/24-- recommended outpatient follow-up for end colostomy and antibiotics to be given for now - I spoke with her daughter who states she and her sisters would like her to have surgery here- I re-contacted surgery - surgery re consult 6/30 - recommended to stopped antibiotics and obtain colonoscopy prior to surgery - antibiotics stopped on 6/30- -   colonoscopy attempted- could not pass scope past Sigmoid due to spasm- other than moderate diverticulosis, colon looked normal up to this point  advised staff to get her out of bed to a commode QID to ensure she does not develop skin breakdown - will await Gen surgery recommendations in regards to next step  Active Problems:  AKI - ? Prerenal-improving-  -baseline creatinine 0.86-presented with a creatinine of 2.70 on 6/24 -Losartan on hold  - maintaining on 1/2 NS at 75 cc/hr as she spends most of her day sleeping and is not drinking fluid to match her losses  Essential hypertension -BP has been quite  elevated -Continue Norvasc and Toprol -  when necessary hydralazine IV -Change normal saline to half-normal saline -Added 3 times a day oral hydralazine  Dementia -Apparently lives alone and has a daughter taking care of her -On Aricept  Current smoker -Has nicotine patch   DVT prophylaxis: Heparin Code Status: Full code Family Communication: Best boy Disposition Plan: due to dementia, refuses PT and refuses SNF- family would like her to go to SNF and the plan is for her to go to a SNF when work up complete Consultants:   Gen. Surgery Procedures:    Antimicrobials:  Anti-infectives    Start     Dose/Rate Route Frequency Ordered Stop   12/03/15 2000  ciprofloxacin (CIPRO) tablet 500 mg  Status:  Discontinued     500 mg Oral 2 times daily 12/03/15 0930 12/07/15 1602   12/03/15 1400  metroNIDAZOLE (FLAGYL) tablet 500 mg  Status:  Discontinued     500 mg Oral Every 8 hours 12/03/15 0930 12/07/15 1602   12/02/15 0900  ciprofloxacin (CIPRO) IVPB 400 mg  Status:  Discontinued     400 mg 200 mL/hr over 60 Minutes Intravenous Every 12 hours 12/02/15 0831 12/03/15 0930   12/01/15 1330  metroNIDAZOLE (FLAGYL) IVPB 500 mg  Status:  Discontinued     500 mg 100 mL/hr over 60 Minutes Intravenous Every 8 hours 12/01/15 1220 12/03/15 0930   12/01/15 1330  vancomycin (VANCOCIN) 50 mg/mL oral solution 125 mg  Status:  Discontinued     125 mg Oral Every 6 hours 12/01/15 1220 12/02/15 0831  Objective: Filed Vitals:   12/10/15 1050 12/10/15 1100 12/10/15 1110 12/10/15 1521  BP: 203/72 189/90 195/67 163/57  Pulse: 52 54 63 57  Temp:    98.7 F (37.1 C)  TempSrc:    Oral  Resp: Height:      Weight:      SpO2: 96% 95% 99% 100%    Intake/Output Summary (Last 24 hours) at 12/10/15 1616 Last data filed at 12/10/15 1300  Gross per 24 hour  Intake    560 ml  Output      0 ml  Net    560 ml   Filed Weights   12/09/15 0512 12/10/15 0500 12/10/15 0640  Weight:  121 kg (266 lb 12.1 oz) 119.8 kg (264 lb 1.8 oz) 119.8 kg (264 lb 1.8 oz)    Examination: General exam: Appears comfortable  HEENT: PERRLA, oral mucosa moist, no sclera icterus or thrush Respiratory system: Clear to auscultation. Respiratory effort normal. Cardiovascular system: S1 & S2 heard, RRR.  No murmurs  Gastrointestinal system: Abdomen soft, non-tender, nondistended. Normal bowel sound. No organomegaly Central nervous system: Alert and oriented. No focal neurological deficits. Extremities: No cyanosis, clubbing or edema Skin: No rashes or ulcers Psychiatry: confused, calm    Data Reviewed: I have personally reviewed following labs and imaging studies  CBC:  Recent Labs Lab 12/08/15 0557  WBC 7.0  HGB 12.8  HCT 39.8  MCV 78.0  PLT 157   Basic Metabolic Panel:  Recent Labs Lab 12/04/15 0937 12/08/15 0557 12/10/15 0516  NA 143 140 140  K 3.6 3.6 3.9  CL 114* 111 109  CO2 GLUCOSE 162* 91 86  BUN 26* 22* 13  CREATININE 1.60* 1.15* 1.01*  CALCIUM 8.7* 8.7* 8.8*   GFR: Estimated Creatinine Clearance: 72 mL/min (by C-G formula based on Cr of 1.01). Liver Function Tests: No results for input(s): AST, ALT, ALKPHOS, BILITOT, PROT, ALBUMIN in the last 168 hours. No results for input(s): LIPASE, AMYLASE in the last 168 hours. No results for input(s): AMMONIA in the last 168 hours. Coagulation Profile: No results for input(s): INR, PROTIME in the last 168 hours. Cardiac Enzymes: No results for input(s): CKTOTAL, CKMB, CKMBINDEX, TROPONINI in the last 168 hours. BNP (last 3 results) No results for input(s): PROBNP in the last 8760 hours. HbA1C: No results for input(s): HGBA1C in the last 72 hours. CBG:  Recent Labs Lab 12/09/15 1513 12/09/15 1807 12/10/15 0025 12/10/15 0630 12/10/15 1212  GLUCAP 84 106* 87 90 97   Lipid Profile: No results for input(s): CHOL, HDL, LDLCALC, TRIG, CHOLHDL, LDLDIRECT in the last 72 hours. Thyroid Function  Tests: No results for input(s): TSH, T4TOTAL, FREET4, T3FREE, THYROIDAB in the last 72 hours. Anemia Panel: No results for input(s): VITAMINB12, FOLATE, FERRITIN, TIBC, IRON, RETICCTPCT in the last 72 hours. Urine analysis:    Component Value Date/Time   COLORURINE YELLOW 12/01/2015 0638   APPEARANCEUR CLOUDY* 12/01/2015 0638   LABSPEC 1.016 12/01/2015 0638   PHURINE 5.0 12/01/2015 0638   GLUCOSEU NEGATIVE 12/01/2015 0638   HGBUR NEGATIVE 12/01/2015 0638   BILIRUBINUR NEGATIVE 12/01/2015 0638   KETONESUR NEGATIVE 12/01/2015 0638   PROTEINUR NEGATIVE 12/01/2015 0638   UROBILINOGEN 0.2 02/15/2015 1615   NITRITE NEGATIVE 12/01/2015 0638   LEUKOCYTESUR NEGATIVE 12/01/2015 1610   Sepsis Labs: (procalcitonin:4,lacticidven:4) ) Recent Results (from the past 240 hour(s))  Culture, blood (routine x 2)     Status: None   Collection Time:  12/01/15 12:55 PM  Result Value Ref Range Status   Specimen Description BLOOD LEFT ANTECUBITAL  Final   Special Requests BOTTLES DRAWN AEROBIC AND ANAEROBIC 5 CC EACH  Final   Culture   Final    NO GROWTH 5 DAYS Performed at [facility]    Report Status 12/06/2015 FINAL  Final  C difficile quick scan w PCR reflex     Status: None   Collection Time: 12/01/15  3:23 PM  Result Value Ref Range Status   C Diff antigen NEGATIVE NEGATIVE Final   C Diff toxin NEGATIVE NEGATIVE Final   C Diff interpretation Negative for toxigenic C. difficile  Final  Gastrointestinal Panel by PCR , Stool     Status: None   Collection Time: 12/01/15  3:23 PM  Result Value Ref Range Status   Campylobacter species NOT DETECTED NOT DETECTED Final   Plesimonas shigelloides NOT DETECTED NOT DETECTED Final   Salmonella species NOT DETECTED NOT DETECTED Final   Yersinia enterocolitica NOT DETECTED NOT DETECTED Final   Vibrio species NOT DETECTED NOT DETECTED Final   Vibrio cholerae NOT DETECTED NOT DETECTED Final   Enteroaggregative E coli (EAEC) NOT DETECTED  NOT DETECTED Final   Enteropathogenic E coli (EPEC) NOT DETECTED NOT DETECTED Final   Enterotoxigenic E coli (ETEC) NOT DETECTED NOT DETECTED Final   Shiga like toxin producing E coli (STEC) NOT DETECTED NOT DETECTED Final   E. coli O157 NOT DETECTED NOT DETECTED Final   Shigella/Enteroinvasive E coli (EIEC) NOT DETECTED NOT DETECTED Final   Cryptosporidium NOT DETECTED NOT DETECTED Final   Cyclospora cayetanensis NOT DETECTED NOT DETECTED Final   Entamoeba histolytica NOT DETECTED NOT DETECTED Final   Giardia lamblia NOT DETECTED NOT DETECTED Final   Adenovirus F40/41 NOT DETECTED NOT DETECTED Final   Astrovirus NOT DETECTED NOT DETECTED Final   Norovirus GI/GII NOT DETECTED NOT DETECTED Final   Rotavirus A NOT DETECTED NOT DETECTED Final   Sapovirus (I, II, IV, and V) NOT DETECTED NOT DETECTED Final  Culture, blood (routine x 2)     Status: None   Collection Time: 12/02/15 11:33 AM  Result Value Ref Range Status   Specimen Description BLOOD BLOOD RIGHT HAND  Final   Special Requests IN PEDIATRIC BOTTLE 1CC  Final   Culture   Final    NO GROWTH 5 DAYS Performed at [facility]    Report Status 12/07/2015 FINAL  Final  Surgical pcr screen     Status: None   Collection Time: 12/09/15 10:31 PM  Result Value Ref Range Status   MRSA, PCR NEGATIVE NEGATIVE Final   Staphylococcus aureus NEGATIVE NEGATIVE Final    Comment:        The Xpert SA Assay (FDA approved for NASAL specimens in patients over 69 years of age), is one component of a comprehensive surveillance program.  Test performance has been validated by [facility] for patients greater than or equal to 23 year old. It is not intended to diagnose infection nor to guide or monitor treatment.          Radiology Studies: No results found.    Scheduled Meds: . amLODipine  10 mg Oral Daily  . aspirin EC  81 mg Oral Daily  . atorvastatin  20 mg Oral q1800  . donepezil  5 mg Oral QHS  . heparin  5,000 Units  Subcutaneous Q8H  . hydrALAZINE  50 mg Oral Q8H  . hydrOXYzine  25 mg Oral QHS  .  metoprolol succinate  100 mg Oral Daily  . nicotine  7 mg Transdermal Daily  . QUEtiapine  12.5 mg Oral QHS   Continuous Infusions: . sodium chloride 75 mL/hr at 12/09/15 2235  . sodium chloride       LOS: 9 days    Time spent in minutes: 35    Cortina Vultaggio, MD [facility] Pager: [email or web address] Password [email or web address] 12/10/2015, 4:16 PM

## 2015-12-10 NOTE — Progress Notes (Signed)
Received word that pt only drank about half her Nulytely and that she had a formed BM this morning.    Current plan, as developed by Dr. Bosie Clos yesterday, is to do as extensive an exam today as conditions will permit, which most likely will just end up being a flex sig.  To facilitate that, I have ordered a Fleet enema which will hopefully give some additional cleansing of the sigmoid region, which is the area of greatest interest for eval of her colo-vaginal fistula.  [name], M.D. Pager [phone] If no answer or after 5 PM call [phone]

## 2015-12-11 LAB — GLUCOSE, CAPILLARY
GLUCOSE-CAPILLARY: 115 mg/dL — AB (ref 65–99)
GLUCOSE-CAPILLARY: 85 mg/dL (ref 65–99)
Glucose-Capillary: 126 mg/dL — ABNORMAL HIGH (ref 65–99)
Glucose-Capillary: 137 mg/dL — ABNORMAL HIGH (ref 65–99)

## 2015-12-11 NOTE — Progress Notes (Signed)
[facility] will continue to monitor for stability and placement at [facility] when medically stable per MD.  Vivi Barrack, Theresia Majors, MSW Clinical Social Worker 5E and Psychiatric Service Line [phone] 12/11/2015  9:59 AM

## 2015-12-11 NOTE — Progress Notes (Signed)
OT Cancellation Note  Patient Details Name: [name] MRN: [id number] DOB: Aug 21, 1955   Cancelled Treatment:    Reason Eval/Treat Not Completed: Other (comment).  Noted that pt refused PT approximately 30 minutes ago. Will reattempt tomorrow as schedule permits.   [name] 12/11/2015, 3:14 PM  [name], OTR/L [phone] 12/11/2015

## 2015-12-11 NOTE — Progress Notes (Signed)
PT Cancellation Note  Patient Details Name: [name] MRN: [id number] DOB: 20-Apr-1956   Cancelled Treatment:     Pt declines any OOB no reason given other that "I'm not getting up"   Pt has had multiple days of refusals.  Will update LPT.   Felecia Shelling  PTA [facility]  Acute  Rehab Pager      [phone]

## 2015-12-11 NOTE — Progress Notes (Signed)
PROGRESS NOTE    [name]  GTX:[id number] DOB: 28-Jun-1955 DOA: 12/01/2015  PCP: Jackie Plum, MD   Brief Narrative:  60 year old female with dementia, stroke, diverticulosis, diverticular abscess in 2009, asthma hypertension who presented to the hospital with abdominal pain. She was diagnosed with acute diverticulitis on 6/12 and was discharged on Cipro and Flagyl. 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 She declined surgery. She is currently on antibiotics. She was also found to have acute renal injury which is steadily improving.  Subjective: No complaints of abdominal pain or diarrhea.   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 - h/o abscess in the past as well -  incontinent of stool- dementia too severe for her to tell staff when she has the urge to urinate or have a BM and essentially sits in her stool until checked  -General surgery consult on 6/24-- recommended outpatient follow-up for end colostomy and antibiotics to be given for now - I spoke with her daughter who states she and her sisters would like her to have surgery here- I re-contacted surgery - surgery re consult 6/30 - recommended to stopped antibiotics and obtain colonoscopy prior to surgery - antibiotics stopped on 6/30- -   colonoscopy attempted- could not pass scope past Sigmoid due to spasm- other than moderate diverticulosis, colon looked normal up to this point  advised staff to get her out of bed to a commode QID to ensure she does not develop skin breakdown - will await Gen surgery recommendations in regards to next step  Active Problems:  AKI - ? Prerenal-improving-  -baseline creatinine 0.86-presented with a creatinine of 2.70 on 6/24 -Losartan on hold  - maintaining on 1/2 NS at 75 cc/hr as she spends most of her day sleeping and is not drinking fluid to match her losses  Essential hypertension -BP has been quite elevated -Continue  Norvasc and Toprol -  when necessary hydralazine IV -Change normal saline to half-normal saline -Added 3 times a day oral hydralazine  Dementia -Apparently lives alone and has a daughter taking care of her -On Aricept  Current smoker -Has nicotine patch   DVT prophylaxis: Heparin Code Status: Full code Family Communication: Best boy Disposition Plan: due to dementia, refuses PT and refuses SNF- family would like her to go to SNF and the plan is for her to go to a SNF when work up complete Consultants:   Gen. Surgery Procedures:    Antimicrobials:  Anti-infectives    Start     Dose/Rate Route Frequency Ordered Stop   12/03/15 2000  ciprofloxacin (CIPRO) tablet 500 mg  Status:  Discontinued     500 mg Oral 2 times daily 12/03/15 0930 12/07/15 1602   12/03/15 1400  metroNIDAZOLE (FLAGYL) tablet 500 mg  Status:  Discontinued     500 mg Oral Every 8 hours 12/03/15 0930 12/07/15 1602   12/02/15 0900  ciprofloxacin (CIPRO) IVPB 400 mg  Status:  Discontinued     400 mg 200 mL/hr over 60 Minutes Intravenous Every 12 hours 12/02/15 0831 12/03/15 0930   12/01/15 1330  metroNIDAZOLE (FLAGYL) IVPB 500 mg  Status:  Discontinued     500 mg 100 mL/hr over 60 Minutes Intravenous Every 8 hours 12/01/15 1220 12/03/15 0930   12/01/15 1330  vancomycin (VANCOCIN) 50 mg/mL oral solution 125 mg  Status:  Discontinued     125 mg Oral Every 6 hours 12/01/15 1220 12/02/15 0831  Objective: Filed Vitals:   12/10/15 1521 12/10/15 2013 12/11/15 0538 12/11/15 0932  BP: 163/57 149/62 161/58 185/63  Pulse: 57 57 58 61  Temp: 98.7 F (37.1 C) 98.5 F (36.9 C) 97.8 F (36.6 C)   TempSrc: Oral Oral Oral   Resp: Height:      Weight:      SpO2: 100% 98% 98%     Intake/Output Summary (Last 24 hours) at 12/11/15 1422 Last data filed at 12/11/15 1400  Gross per 24 hour  Intake   1005 ml  Output      0 ml  Net   1005 ml   Filed Weights   12/09/15 0512 12/10/15 0500  12/10/15 0640  Weight: 121 kg (266 lb 12.1 oz) 119.8 kg (264 lb 1.8 oz) 119.8 kg (264 lb 1.8 oz)    Examination: General exam: Appears comfortable  HEENT: PERRLA, oral mucosa moist, no sclera icterus or thrush Respiratory system: Clear to auscultation. Respiratory effort normal. Cardiovascular system: S1 & S2 heard, RRR.  No murmurs  Gastrointestinal system: Abdomen soft, non-tender, nondistended. Normal bowel sound. No organomegaly Central nervous system: Alert and oriented. No focal neurological deficits. Extremities: No cyanosis, clubbing or edema Skin: No rashes or ulcers Psychiatry: confused, calm    Data Reviewed: I have personally reviewed following labs and imaging studies  CBC:  Recent Labs Lab 12/08/15 0557  WBC 7.0  HGB 12.8  HCT 39.8  MCV 78.0  PLT 157   Basic Metabolic Panel:  Recent Labs Lab 12/08/15 0557 12/10/15 0516  NA 140 140  K 3.6 3.9  CL 111 109  CO2 23 22  GLUCOSE 91 86  BUN 22* 13  CREATININE 1.15* 1.01*  CALCIUM 8.7* 8.8*   GFR: Estimated Creatinine Clearance: 72 mL/min (by C-G formula based on Cr of 1.01). Liver Function Tests: No results for input(s): AST, ALT, ALKPHOS, BILITOT, PROT, ALBUMIN in the last 168 hours. No results for input(s): LIPASE, AMYLASE in the last 168 hours. No results for input(s): AMMONIA in the last 168 hours. Coagulation Profile: No results for input(s): INR, PROTIME in the last 168 hours. Cardiac Enzymes: No results for input(s): CKTOTAL, CKMB, CKMBINDEX, TROPONINI in the last 168 hours. BNP (last 3 results) No results for input(s): PROBNP in the last 8760 hours. HbA1C: No results for input(s): HGBA1C in the last 72 hours. CBG:  Recent Labs Lab 12/10/15 1212 12/10/15 1855 12/10/15 2304 12/11/15 0537 12/11/15 1216  GLUCAP 97 137* 124* 115* 85   Lipid Profile: No results for input(s): CHOL, HDL, LDLCALC, TRIG, CHOLHDL, LDLDIRECT in the last 72 hours. Thyroid Function Tests: No results for  input(s): TSH, T4TOTAL, FREET4, T3FREE, THYROIDAB in the last 72 hours. Anemia Panel: No results for input(s): VITAMINB12, FOLATE, FERRITIN, TIBC, IRON, RETICCTPCT in the last 72 hours. Urine analysis:    Component Value Date/Time   COLORURINE YELLOW 12/01/2015 0638   APPEARANCEUR CLOUDY* 12/01/2015 0638   LABSPEC 1.016 12/01/2015 0638   PHURINE 5.0 12/01/2015 0638   GLUCOSEU NEGATIVE 12/01/2015 0638   HGBUR NEGATIVE 12/01/2015 0638   BILIRUBINUR NEGATIVE 12/01/2015 0638   KETONESUR NEGATIVE 12/01/2015 0638   PROTEINUR NEGATIVE 12/01/2015 0638   UROBILINOGEN 0.2 02/15/2015 1615   NITRITE NEGATIVE 12/01/2015 0638   LEUKOCYTESUR NEGATIVE 12/01/2015 0638   Sepsis Labs: (procalcitonin:4,lacticidven:4) ) Recent Results (from the past 240 hour(s))  C difficile quick scan w PCR reflex     Status: None   Collection Time: 12/01/15  3:23 PM  Result Value Ref Range Status   C Diff antigen NEGATIVE NEGATIVE Final   C Diff toxin NEGATIVE NEGATIVE Final   C Diff interpretation Negative for toxigenic C. difficile  Final  Gastrointestinal Panel by PCR , Stool     Status: None   Collection Time: 12/01/15  3:23 PM  Result Value Ref Range Status   Campylobacter species NOT DETECTED NOT DETECTED Final   Plesimonas shigelloides NOT DETECTED NOT DETECTED Final   Salmonella species NOT DETECTED NOT DETECTED Final   Yersinia enterocolitica NOT DETECTED NOT DETECTED Final   Vibrio species NOT DETECTED NOT DETECTED Final   Vibrio cholerae NOT DETECTED NOT DETECTED Final   Enteroaggregative E coli (EAEC) NOT DETECTED NOT DETECTED Final   Enteropathogenic E coli (EPEC) NOT DETECTED NOT DETECTED Final   Enterotoxigenic E coli (ETEC) NOT DETECTED NOT DETECTED Final   Shiga like toxin producing E coli (STEC) NOT DETECTED NOT DETECTED Final   E. coli O157 NOT DETECTED NOT DETECTED Final   Shigella/Enteroinvasive E coli (EIEC) NOT DETECTED NOT DETECTED Final   Cryptosporidium NOT DETECTED NOT  DETECTED Final   Cyclospora cayetanensis NOT DETECTED NOT DETECTED Final   Entamoeba histolytica NOT DETECTED NOT DETECTED Final   Giardia lamblia NOT DETECTED NOT DETECTED Final   Adenovirus F40/41 NOT DETECTED NOT DETECTED Final   Astrovirus NOT DETECTED NOT DETECTED Final   Norovirus GI/GII NOT DETECTED NOT DETECTED Final   Rotavirus A NOT DETECTED NOT DETECTED Final   Sapovirus (I, II, IV, and V) NOT DETECTED NOT DETECTED Final  Culture, blood (routine x 2)     Status: None   Collection Time: 12/02/15 11:33 AM  Result Value Ref Range Status   Specimen Description BLOOD BLOOD RIGHT HAND  Final   Special Requests IN PEDIATRIC BOTTLE 1CC  Final   Culture   Final    NO GROWTH 5 DAYS Performed at [facility]    Report Status 12/07/2015 FINAL  Final  Surgical pcr screen     Status: None   Collection Time: 12/09/15 10:31 PM  Result Value Ref Range Status   MRSA, PCR NEGATIVE NEGATIVE Final   Staphylococcus aureus NEGATIVE NEGATIVE Final    Comment:        The Xpert SA Assay (FDA approved for NASAL specimens in patients over 25 years of age), is one component of a comprehensive surveillance program.  Test performance has been validated by [facility] for patients greater than or equal to 24 year old. It is not intended to diagnose infection nor to guide or monitor treatment.          Radiology Studies: No results found.    Scheduled Meds: . amLODipine  10 mg Oral Daily  . aspirin EC  81 mg Oral Daily  . atorvastatin  20 mg Oral q1800  . donepezil  5 mg Oral QHS  . heparin  5,000 Units Subcutaneous Q8H  . hydrALAZINE  50 mg Oral Q8H  . hydrOXYzine  25 mg Oral QHS  . metoprolol succinate  100 mg Oral Daily  . nicotine  7 mg Transdermal Daily  . QUEtiapine  12.5 mg Oral QHS   Continuous Infusions: . sodium chloride 75 mL/hr at 12/11/15 0700  . sodium chloride       LOS: 10 days    Time spent in minutes: 35    Carlton Buskey, MD [facility] Pager: [email or web address] Password [email or web address] 12/11/2015, 2:22 PM

## 2015-12-12 ENCOUNTER — Encounter (HOSPITAL_COMMUNITY): Payer: Self-pay | Admitting: Gastroenterology

## 2015-12-12 DIAGNOSIS — F0390 Unspecified dementia without behavioral disturbance: Secondary | ICD-10-CM

## 2015-12-12 DIAGNOSIS — I25119 Atherosclerotic heart disease of native coronary artery with unspecified angina pectoris: Secondary | ICD-10-CM

## 2015-12-12 DIAGNOSIS — I5032 Chronic diastolic (congestive) heart failure: Secondary | ICD-10-CM

## 2015-12-12 DIAGNOSIS — I110 Hypertensive heart disease with heart failure: Secondary | ICD-10-CM

## 2015-12-12 DIAGNOSIS — E785 Hyperlipidemia, unspecified: Secondary | ICD-10-CM

## 2015-12-12 DIAGNOSIS — G4700 Insomnia, unspecified: Secondary | ICD-10-CM

## 2015-12-12 DIAGNOSIS — N824 Other female intestinal-genital tract fistulae: Secondary | ICD-10-CM

## 2015-12-12 DIAGNOSIS — N179 Acute kidney failure, unspecified: Secondary | ICD-10-CM

## 2015-12-12 DIAGNOSIS — Z720 Tobacco use: Secondary | ICD-10-CM

## 2015-12-12 LAB — CBC
HCT: 39.8 % (ref 36.0–46.0)
HEMOGLOBIN: 12.6 g/dL (ref 12.0–15.0)
MCH: 24.9 pg — AB (ref 26.0–34.0)
MCHC: 31.7 g/dL (ref 30.0–36.0)
MCV: 78.7 fL (ref 78.0–100.0)
Platelets: 183 10*3/uL (ref 150–400)
RBC: 5.06 MIL/uL (ref 3.87–5.11)
RDW: 17.4 % — ABNORMAL HIGH (ref 11.5–15.5)
WBC: 7.3 10*3/uL (ref 4.0–10.5)

## 2015-12-12 LAB — BASIC METABOLIC PANEL
ANION GAP: 5 (ref 5–15)
BUN: 18 mg/dL (ref 6–20)
CHLORIDE: 111 mmol/L (ref 101–111)
CO2: 23 mmol/L (ref 22–32)
CREATININE: 1 mg/dL (ref 0.44–1.00)
Calcium: 8.9 mg/dL (ref 8.9–10.3)
GFR calc non Af Amer: 60 mL/min — ABNORMAL LOW (ref >60)
GLUCOSE: 88 mg/dL (ref 65–99)
Potassium: 4.1 mmol/L (ref 3.5–5.1)
Sodium: 139 mmol/L (ref 135–145)

## 2015-12-12 LAB — GLUCOSE, CAPILLARY: Glucose-Capillary: 91 mg/dL (ref 65–99)

## 2015-12-12 MED ORDER — HYDRALAZINE HCL 50 MG PO TABS: 50.0000 mg | ORAL_TABLET | Freq: Three times a day (TID) | ORAL | Status: DC

## 2015-12-12 MED ORDER — ASPIRIN 81 MG PO TBEC: 81.0000 mg | DELAYED_RELEASE_TABLET | Freq: Every day | ORAL | Status: AC

## 2015-12-12 MED ORDER — METOPROLOL SUCCINATE ER 100 MG PO TB24: 100.0000 mg | ORAL_TABLET | Freq: Every day | ORAL | Status: DC

## 2015-12-12 NOTE — Discharge Instructions (Signed)
Anal Fistula °An anal fistula is an abnormal tunnel that develops between the bowel and the skin near the outside of the anus, where stool (feces) comes out. The anus has many tiny glands that make lubricating fluid. Sometimes, these glands become plugged and infected, and that can cause a fluid-filled pocket (abscess) to form. An anal fistula often develops after this infection or abscess. °CAUSES °In most cases, an anal fistula is caused by a past or current anal abscess. Other causes include: °· A complication of surgery. °· Trauma to the rectal area. °· Radiation to the area. °· Medical conditions or diseases, such as: °¨ Chronic inflammatory bowel disease, such as Crohn disease or ulcerative colitis. °¨ Colon cancer or rectal cancer. °¨ Diverticular disease, such as diverticulitis. °¨ An STD (sexually transmitted disease), such as gonorrhea, chlamydia, or syphilis. °¨ An infection that is caused by HIV (human immunodeficiency virus). °¨ Foreign body in the rectum. °SYMPTOMS °Symptoms of this condition include: °· Throbbing or constant pain that may be worse while you are sitting. °· Swelling or irritation around the anus. °· Drainage of pus or blood from an opening near the anus. °· Pain with bowel movements. °· Fever or chills. °DIAGNOSIS °Your health care provider will examine the area to find the openings of the anal fistula and the fistula tract. The external opening of the anal fistula may be seen during a physical exam. You may also have tests, including: °· An exam of the rectal area with a gloved hand (digital rectal exam). °· An exam with a probe or scope to help locate the internal opening of the fistula. °· Imaging tests to find the exact location and path of the fistula. These tests may include X-rays, an ultrasound, a CT scan, or MRI. The path is made visible by a dye that is injected into the fistula opening. °You may have other tests to find the cause of the anal fistula. °TREATMENT °The most  common treatment for an anal fistula is surgery. The type of surgery that is used will depend on where the fistula is located and how complex the fistula is. Surgical options include: °· A fistulotomy. The whole fistula is opened up, and the contents are drained to promote healing. °· Seton placement. A silk string (seton) is placed into the fistula during a fistulotomy. This helps to drain any infection to promote healing. °· Advancement flap procedure. Tissue is removed from your rectum or the skin around the anus and is attached to the opening of the fistula. °· Bioprosthetic plug. A cone-shaped plug is made from your tissue and is used to block the opening of the fistula. °Some anal fistulas do not require surgery. A nonsurgical treatment option involves injecting a fibrin glue to seal the fistula. You also may be prescribed an antibiotic medicine to treat an infection. °HOME CARE INSTRUCTIONS °Medicines °· Take over-the-counter and prescription medicines only as told by your health care provider. °· If you were prescribed an antibiotic medicine, take it as told by your health care provider. Do not stop taking the antibiotic even if you start to feel better. °· Use a stool softener or a laxative if told to do so by your health care provider. °General Instructions °· Eat a high-fiber diet as told by your health care provider. This can help to prevent constipation. °· Drink enough fluid to keep your urine clear or pale yellow. °· Take a warm sitz bath for 15-20 minutes, 3-4 times per day, or as   told by your health care provider. Sitz baths can ease your pain and discomfort and help with healing. °· Follow good hygiene to keep the anal area as clean and dry as possible. Use wet toilet paper or a moist towelette after each bowel movement. °· Keep all follow-up visits as told by your health care provider. This is important. °SEEK MEDICAL CARE IF: °· You have increased pain that is not controlled with  medicines. °· You have new redness or swelling around the anal area. °· You have new fluid, blood, or pus coming from the anal area. °· You have tenderness or warmth around the anal area. °SEEK IMMEDIATE MEDICAL CARE IF: °· You have a fever. °· You have severe pain. °· You have chills or diarrhea. °· You have severe problems urinating or having a bowel movement. °  °This information is not intended to replace advice given to you by your health care provider. Make sure you discuss any questions you have with your health care provider. °  °Document Released: 05/08/2008 Document Revised: 02/14/2015 Document Reviewed: 08/21/2014 °Elsevier Interactive Patient Education ©2016 [employer] ° °

## 2015-12-12 NOTE — Progress Notes (Signed)
Report called to [facility].  °

## 2015-12-12 NOTE — Clinical Social Work Placement (Signed)
   CLINICAL SOCIAL WORK PLACEMENT  NOTE  Date:  12/12/2015  Patient Details  Name: [name] MRN: [id number] Date of Birth: Oct 15, 1955  Clinical Social Work is seeking post-discharge placement for this patient at the Skilled  Nursing Facility level of care (*CSW will initial, date and re-position this form in  chart as items are completed):  Yes   Patient/family provided with [facility] Clinical Social Work Department's list of facilities offering this level of care within the geographic area requested by the patient (or if unable, by the patient's family).  Yes   Patient/family informed of their freedom to choose among providers that offer the needed level of care, that participate in Medicare, Medicaid or managed care program needed by the patient, have an available bed and are willing to accept the patient.  Yes   Patient/family informed of 's ownership interest in [facility] and [facility], as well as of the fact that they are under no obligation to receive care at these facilities.  PASRR submitted to EDS on       PASRR number received on       Existing PASRR number confirmed on 12/06/15     FL2 transmitted to all facilities in geographic area requested by pt/family on       FL2 transmitted to all facilities within larger geographic area on 12/04/15     Patient informed that his/her managed care company has contracts with or will negotiate with certain facilities, including the following:        Yes   Patient/family informed of bed offers received.  Patient chooses bed at [facility]     Physician recommends and patient chooses bed at      Patient to be transferred to [facility] on 12/12/15.  Patient to be transferred to facility by [facility]     Patient family notified on 12/12/15 of transfer.  Name of family member notified:  DAUGHTER     PHYSICIAN       Additional Comment: Pt / daughter are in agreement with  d/c to [facility] today. [facility] transport is required. Medical Necessity form completed. D/C Summary sent to SNF for review. Scripts included in d/c packet. # for report provided to [facility].   _______________________________________________ Royetta Asal, LCSW   [phone] 12/12/2015, 2:29 PM

## 2015-12-12 NOTE — Discharge Summary (Signed)
Physician Discharge Summary  [name] QMV:[id number] DOB: [birth year] DOA: 12/01/2015  PCP: Jackie Plum, MD  Admit date: 12/01/2015 Discharge date: 12/12/2015  Admitted From: Home Disposition:  SNF  Recommendations for Outpatient Follow-up:  1. Follow up with general surgery in 1-2 weeks to discuss surgical repair of fistula 2. Please follow up with PCP in 1-2 weeks   Discharge Condition: Stable Brief/Interim Summary: Brief Narrative:  60 year old female with dementia, stroke, diverticulosis, diverticular abscess in 2009, asthma hypertension who presented to the hospital with abdominal pain. She was diagnosed with acute diverticulitis on 6/12 and was discharged on Cipro and Flagyl. 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 She declined surgery. She was initially on antibiotics. She was also found to have acute renal injury which is steadily improving.  Subjective: No complaints of abdominal pain or diarrhea.   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  Pt to follow up outpatient with surgery 1-2 weeks.  - surgery re consult 6/30 - recommended to stopped antibiotics and obtain colonoscopy prior to surgery - antibiotics stopped on 6/30-  -colonoscopy attempted- could not pass scope past Sigmoid due to spasm- other than moderate diverticulosis, colon looked normal up to this point  (see full report)  AKI - Prerenal-resolved after IVFs.  -baseline creatinine 0.86-presented with a creatinine of 2.70 on 6/24 -Losartan on hold  Essential hypertension -BP has been quite elevated -Continue Norvasc and  Toprol and hydralazine.  Losartan on hold but can restart later if needed for blood pressure control  Dementia -Apparently lives alone and has a daughter taking care of her -On Aricept - She is being discharged into SNF.   Current smoker -was given nicotine patch while in hospital   DVT prophylaxis: Heparin Code Status: Full code Family Communication: Dorris Carnes Disposition Plan: due to dementia, refused PT and refuses SNF- family would like her to go to SNF and the plan is for her to go to a SNF  Consultants:   Gen. Surgery  Gastroenterology  Procedures:  Colonoscopy  Discharge Diagnoses:  Principal Problem:   Colovaginal fistula Active Problems:   Cigarette smoker   CAD (coronary artery disease)   Hypertensive heart disease   HLD (hyperlipidemia)   AKI (acute kidney injury) (HCC)   Diarrhea   Dementia   Insomnia   Chronic pain   Chronic diastolic heart failure [facility])  Discharge Instructions  Discharge Instructions    Diet - low sodium heart healthy    Complete by:  As directed      Increase activity slowly    Complete by:  As directed             Medication List    STOP taking these medications        losartan 50 MG tablet  Commonly known as:  COZAAR      TAKE these medications        albuterol 108 (90 Base) MCG/ACT inhaler  Commonly known as:  PROVENTIL HFA;VENTOLIN HFA  1 to 2 puffs every 4 to 6 hours as needed     amLODipine 10 MG tablet  Commonly known as:  NORVASC  Take 1 tablet (10 mg total) by mouth daily.     aspirin 81 MG EC tablet  Take 1 tablet (81 mg total) by mouth daily.     atorvastatin 20 MG tablet  Commonly known as:  LIPITOR  Take 1 tablet (20 mg total) by mouth daily at 6 PM.     baclofen 10 MG tablet  Commonly known as:  LIORESAL  Take 10 mg by mouth 3 (three) times daily as needed for muscle spasms.     donepezil 10 MG tablet  Commonly known as:  ARICEPT  Take 1/2 tablet daily for 1 month, then increase to 1  tablet daily     hydrALAZINE 50 MG tablet  Commonly known as:  APRESOLINE  Take 1 tablet (50 mg total) by mouth every 8 (eight) hours.     HYDROcodone-acetaminophen 5-325 MG tablet  Commonly known as:  NORCO/VICODIN  Take 1 tablet by mouth every 8 (eight) hours as needed for moderate pain or severe pain.     hydrOXYzine 25 MG capsule  Commonly known as:  VISTARIL  Take 25 mg by mouth at bedtime.     metoprolol succinate 100 MG 24 hr tablet  Commonly known as:  TOPROL-XL  Take 1 tablet (100 mg total) by mouth daily.     QUEtiapine 25 MG tablet  Commonly known as:  SEROQUEL  Take 1/2 tablet at night           Follow-up Information    Follow up with Rodman Pickle, MD. Schedule an appointment as soon as possible for a visit in 2 weeks.   Specialty:  General Surgery   Why:  Hospital Follow Up on connection between vagina and colon and interested in surgery,    Contact information:   [address] [place] [address] [phone]       Follow up with OSEI-BONSU,GEORGE, MD. Schedule an appointment as soon as possible for a visit in 1 week.   Specialty:  Internal Medicine   Why:  Hospital Follow Up   Contact information:   [address] [place] [address] [phone]      Allergies  Allergen Reactions  . Penicillins Itching and Nausea And Vomiting    Has patient had a PCN reaction causing immediate rash, facial/tongue/throat swelling, SOB or lightheadedness with hypotension: No Has patient had a PCN reaction causing severe rash involving mucus membranes or skin necrosis: No Has patient had a PCN reaction that required hospitalization: No Has patient had a PCN reaction occurring within the last 10 years: No      Procedures/Studies: Ct Abdomen Pelvis Wo Contrast  12/01/2015  CLINICAL DATA:  Mid to right abdominal pain and generalized weakness. Diarrhea. EXAM: CT ABDOMEN AND PELVIS WITHOUT CONTRAST TECHNIQUE: Multidetector CT  imaging of the abdomen and pelvis was performed following the standard protocol without IV contrast. COMPARISON:  11/19/2015 CT abdomen/ pelvis. FINDINGS: Motion degraded study. Lower chest: Stable subsegmental scarring versus atelectasis in the left lower lobe. Stable trace pericardial fluid/ thickening. Hepatobiliary: Normal liver with no liver mass. Cholecystectomy. No biliary ductal dilatation. Pancreas: Normal, with no mass or duct dilation. Spleen: Normal size. No mass. Adrenals/Urinary Tract: Normal adrenals. No hydronephrosis. Vascular calcifications are noted in the renal sinuses, with no renal stones. No contour deforming renal mass. Normal bladder. Stomach/Bowel: Grossly normal stomach. Normal caliber small bowel with no small bowel wall thickening. Appendix not discretely visualized. No pericecal inflammatory changes. Mild to moderate sigmoid  diverticulosis. No appreciable colonic wall thickening or new pericolonic fat stranding. Vascular/Lymphatic: Atherosclerotic nonaneurysmal abdominal aorta. No pathologically enlarged lymph nodes in the abdomen or pelvis. Reproductive: Status post hysterectomy. Loss of the normal fat plane between the vaginal cuff and proximal sigmoid colon with associated ill-defined fluid and gas in the vaginal fornix and surrounding mild fat stranding, unchanged. No adnexal mass. Other: No pneumoperitoneum, ascites or focal fluid collection. Musculoskeletal: No aggressive appearing focal osseous lesions. Severe degenerative changes in the visualized thoracolumbar spine. IMPRESSION: 1. Mild to moderate sigmoid diverticulosis. Loss of the normal fat plane between the vaginal cuff and proximal sigmoid colon with associated ill-defined surrounding fat stranding and fluid and gas in the vaginal fornix. Colovaginal fistula cannot be excluded. 2. No focal drainable fluid collections. No evidence of bowel obstruction. Electronically Signed   By: Delbert Phenix M.D.   On: 12/01/2015 10:00    Ct Abdomen Pelvis W Contrast  11/20/2015  CLINICAL DATA:  Generalized abdominal pain and low back pain since last night. Poor IV access. Contrast material was administered by hand. No contrast material is demonstrated within the vascular structures on the images. Contrast extravasation is not excluded. EXAM: CT ABDOMEN AND PELVIS WITH CONTRAST TECHNIQUE: Multidetector CT imaging of the abdomen and pelvis was performed using the standard protocol following bolus administration of intravenous contrast. CONTRAST:  ISOVUE-300 IOPAMIDOL (ISOVUE-300) INJECTION 61% COMPARISON:  05/24/2015 FINDINGS: Atelectasis in the lung bases. Surgical absence of the gallbladder. No bile duct dilatation. Unenhanced appearance of the liver, spleen, pancreas, adrenal glands, kidneys, abdominal aorta, inferior vena cava, and retroperitoneal lymph nodes is unremarkable. Stomach, small bowel, and colon are not abnormally distended. Contrast material flows to the colon without evidence of bowel obstruction. No free air or free fluid in the abdomen. Pelvis: Appendix is not identified. Uterus is surgically absent. Gas collection in the left pelvis appears to communicate with the vagina but is somewhat high. This may be postoperative or may indicate a vaginal fistula. Clinical correlation suggested. Diverticulosis of the sigmoid colon. Mild infiltration around the sigmoid colon may represent early diverticulitis. No abscess. No free or loculated pelvic fluid collections. No pelvic mass or lymphadenopathy. Degenerative changes in the lumbar spine. No destructive bone lesions. IMPRESSION: Probable mild changes of diverticulitis. No abscess. Gas collections somewhat high in the left pelvis may be postoperative changes related to vagina but a colovaginal fistula is not excluded and clinical correlation is suggested. Electronically Signed   By: Burman Nieves M.D.   On: 11/20/2015 01:00     Subjective: Pt without complaints.   Not  cooperating with PT today.   Discharge Exam: Filed Vitals:   12/12/15 0855 12/12/15 0856  BP: 164/54 164/54  Pulse: 61   Temp:    Resp:     Filed Vitals:   12/11/15 2054 12/12/15 0636 12/12/15 0855 12/12/15 0856  BP: 156/64 177/55 164/54 164/54  Pulse: 54 50 61   Temp: 98.7 F (37.1 C) 98 F (36.7 C)    TempSrc: Oral Oral    Resp: 18 17    Height:      Weight: 261 lb 3.9 oz (118.5 kg)     SpO2: 98% 100%     General exam: Appears comfortable  HEENT: PERRLA, oral mucosa moist, no sclera icterus or thrush Respiratory system: Clear to auscultation. Respiratory effort normal. Cardiovascular system: S1 & S2 heard, RRR. No murmurs  Gastrointestinal system: Abdomen soft, non-tender, nondistended. Normal bowel sound. No organomegaly Central nervous system: Alert and oriented.  No focal neurological deficits. Extremities: No cyanosis, clubbing or edema Skin: No rashes or ulcers Psychiatry: confused, calm   The results of significant diagnostics from this hospitalization (including imaging, microbiology, ancillary and laboratory) are listed below for reference.     Microbiology: Recent Results (from the past 240 hour(s))  Surgical pcr screen     Status: None   Collection Time: 12/09/15 10:31 PM  Result Value Ref Range Status   MRSA, PCR NEGATIVE NEGATIVE Final   Staphylococcus aureus NEGATIVE NEGATIVE Final    Comment:        The Xpert SA Assay (FDA approved for NASAL specimens in patients over 34 years of age), is one component of a comprehensive surveillance program.  Test performance has been validated by [facility] for patients greater than or equal to 55 year old. It is not intended to diagnose infection nor to guide or monitor treatment.      Labs: BNP (last 3 results)  Recent Labs  02/17/15 2010  BNP 190.3*   Basic Metabolic Panel:  Recent Labs Lab 12/08/15 0557 12/10/15 0516 12/12/15 0507  NA 140 140 139  K 3.6 3.9 4.1  CL 111 109 111  CO2  23 22 23   GLUCOSE 91 86 88  BUN 22* 13 18  CREATININE 1.15* 1.01* 1.00  CALCIUM 8.7* 8.8* 8.9   Liver Function Tests: No results for input(s): AST, ALT, ALKPHOS, BILITOT, PROT, ALBUMIN in the last 168 hours. No results for input(s): LIPASE, AMYLASE in the last 168 hours. No results for input(s): AMMONIA in the last 168 hours. CBC:  Recent Labs Lab 12/08/15 0557 12/12/15 0507  WBC 7.0 7.3  HGB 12.8 12.6  HCT 39.8 39.8  MCV 78.0 78.7  PLT 157 183   Cardiac Enzymes: No results for input(s): CKTOTAL, CKMB, CKMBINDEX, TROPONINI in the last 168 hours. BNP: Invalid input(s): POCBNP CBG:  Recent Labs Lab 12/11/15 0537 12/11/15 1216 12/11/15 1812 12/11/15 2338 12/12/15 0623  GLUCAP 115* 85 126* 137* 91   D-Dimer No results for input(s): DDIMER in the last 72 hours. Hgb A1c No results for input(s): HGBA1C in the last 72 hours. Lipid Profile No results for input(s): CHOL, HDL, LDLCALC, TRIG, CHOLHDL, LDLDIRECT in the last 72 hours. Thyroid function studies No results for input(s): TSH, T4TOTAL, T3FREE, THYROIDAB in the last 72 hours.  Invalid input(s): FREET3 Anemia work up No results for input(s): VITAMINB12, FOLATE, FERRITIN, TIBC, IRON, RETICCTPCT in the last 72 hours. Urinalysis    Component Value Date/Time   COLORURINE YELLOW 12/01/2015 0638   APPEARANCEUR CLOUDY* 12/01/2015 0638   LABSPEC 1.016 12/01/2015 0638   PHURINE 5.0 12/01/2015 0638   GLUCOSEU NEGATIVE 12/01/2015 0638   HGBUR NEGATIVE 12/01/2015 0638   BILIRUBINUR NEGATIVE 12/01/2015 0638   KETONESUR NEGATIVE 12/01/2015 0638   PROTEINUR NEGATIVE 12/01/2015 0638   UROBILINOGEN 0.2 02/15/2015 1615   NITRITE NEGATIVE 12/01/2015 0638   LEUKOCYTESUR NEGATIVE 12/01/2015 0539   Sepsis Labs Invalid input(s): PROCALCITONIN,  WBC,  LACTICIDVEN Microbiology Recent Results (from the past 240 hour(s))  Surgical pcr screen     Status: None   Collection Time: 12/09/15 10:31 PM  Result Value Ref Range Status    MRSA, PCR NEGATIVE NEGATIVE Final   Staphylococcus aureus NEGATIVE NEGATIVE Final    Comment:        The Xpert SA Assay (FDA approved for NASAL specimens in patients over 58 years of age), is one component of a comprehensive surveillance program.  Test performance has been validated  by [facility] for patients greater than or equal to 72 year old. It is not intended to diagnose infection nor to guide or monitor treatment.     Time coordinating discharge: 35 minutes  SIGNED:   Standley Dakins, MD  [facility] 12/12/2015, 11:59 AM Pager   If 7PM-7AM, please contact night-coverage [email or web address] Password [email or web address]

## 2015-12-12 NOTE — Progress Notes (Signed)
OT Cancellation Note  Patient Details Name: [name] MRN: [id number] DOB: Nov 13, 1955   Cancelled Treatment:    Reason Eval/Treat Not Completed: Fatigue/lethargy limiting ability to participate  OT provided [facility] encouragement. Pt very sleepy this day.  Alba Cory 12/12/2015, 10:53 AM

## 2016-02-08 ENCOUNTER — Non-Acute Institutional Stay (SKILLED_NURSING_FACILITY): Payer: Medicaid Other | Admitting: Adult Health

## 2016-02-08 ENCOUNTER — Encounter: Payer: Self-pay | Admitting: Adult Health

## 2016-02-08 DIAGNOSIS — J449 Chronic obstructive pulmonary disease, unspecified: Secondary | ICD-10-CM | POA: Diagnosis not present

## 2016-02-08 DIAGNOSIS — I25118 Atherosclerotic heart disease of native coronary artery with other forms of angina pectoris: Secondary | ICD-10-CM | POA: Diagnosis not present

## 2016-02-08 DIAGNOSIS — I5032 Chronic diastolic (congestive) heart failure: Secondary | ICD-10-CM | POA: Diagnosis not present

## 2016-02-08 DIAGNOSIS — F0391 Unspecified dementia with behavioral disturbance: Secondary | ICD-10-CM | POA: Diagnosis not present

## 2016-02-08 DIAGNOSIS — G8929 Other chronic pain: Secondary | ICD-10-CM

## 2016-02-08 DIAGNOSIS — R441 Visual hallucinations: Secondary | ICD-10-CM | POA: Diagnosis not present

## 2016-02-08 DIAGNOSIS — I110 Hypertensive heart disease with heart failure: Secondary | ICD-10-CM | POA: Diagnosis not present

## 2016-02-08 DIAGNOSIS — E785 Hyperlipidemia, unspecified: Secondary | ICD-10-CM | POA: Diagnosis not present

## 2016-02-08 DIAGNOSIS — I639 Cerebral infarction, unspecified: Secondary | ICD-10-CM

## 2016-02-08 DIAGNOSIS — F03B18 Unspecified dementia, moderate, with other behavioral disturbance: Secondary | ICD-10-CM

## 2016-02-08 NOTE — Progress Notes (Signed)
Patient ID: [name], female   DOB: Dec 03, 1955, 60 y.o.   MRN: [id number]   Location:   [facility] Room Number: 208-A Place of Service:  SNF (31)   CODE STATUS: Full Code  Allergies  Allergen Reactions  . Penicillins Itching and Nausea And Vomiting    Has patient had a PCN reaction causing immediate rash, facial/tongue/throat swelling, SOB or lightheadedness with hypotension: No Has patient had a PCN reaction causing severe rash involving mucus membranes or skin necrosis: No Has patient had a PCN reaction that required hospitalization: No Has patient had a PCN reaction occurring within the last 10 years: No      Chief Complaint  Patient presents with  . Hospitalization Follow-up    Hospital Follow up    HPI:  She has been transferred to this facility from another SNF. More than likely this does present a long term placement for her. She is somewhat withdrawn this morning but did participate in the hpi. She tells me that she is feeling "ok". There are no nursing concerns at this time.   Past Medical History:  Diagnosis Date  . Accelerated hypertension   . Arthritis   . Asthma   . Bilateral chronic knee pain   . Chronic bronchitis (HCC)   . Coronary artery disease    a. remote hx of cath ~2000. B. cath  moderate disease in an RPL branch - too distal for PCI; 3rd RPLB lesion, 50% stenosed; hyperdynamic LV with near cavity obliteration & severely elevated LVEDP  . Dementia   . Diastolic CHF (HCC)   . Diverticular disease of large intestine    diverticular abscess in sigmoid region by CT 2009  . Heart attack (HCC)   . Heart murmur   . Hypertension   . Non-ischemic cardiomyopathy (HCC)    cxr - mild cardiomegly, Echo 10/07/14 - severe LVH with LVEF 65-70%.  . Stroke (HCC)   . Wheelchair bound     Past Surgical History:  Procedure Laterality Date  . ABDOMINAL HYSTERECTOMY    . AMPUTATION  08/21/2011   Procedure: AMPUTATION DIGIT;  Surgeon: Sharma Covert, MD;  Location: [facility] OR;  Service: Orthopedics;  Laterality: Right;  . CARDIAC CATHETERIZATION N/A 10/09/2014   Procedure: Left Heart Cath and Coronary Angiography;  Surgeon: Marykay Lex, MD;  Location: [facility] INVASIVE CV LAB CUPID;  Service: Cardiovascular;  Laterality: N/A;  . CARDIAC CATHETERIZATION  10/2014   normal coronary arteries aside for a distal RPLB branch with 50% stenosis  . CORONARY STENT PLACEMENT    . FLEXIBLE SIGMOIDOSCOPY  04/28/2012   Procedure: FLEXIBLE SIGMOIDOSCOPY;  Surgeon: Petra Kuba, MD;  Location: [facility] ENDOSCOPY;  Service: Endoscopy;  Laterality: N/A;  unsedated, unprepped  . FLEXIBLE SIGMOIDOSCOPY N/A 12/10/2015   Procedure: FLEXIBLE SIGMOIDOSCOPY;  Surgeon: Bernette Redbird, MD;  Location: [facility] ENDOSCOPY;  Service: Endoscopy;  Laterality: N/A;  . I&D EXTREMITY  08/18/2011   Procedure: IRRIGATION AND DEBRIDEMENT EXTREMITY;  Surgeon: Sharma Covert, MD;  Location: [facility] OR;  Service: Orthopedics;  Laterality: Right;  I&D Right Long Finger    Social History   Social History  . Marital status: Legally Separated    Spouse name: N/A  . Number of children: N/A  . Years of education: N/A   Occupational History  . Not on file.   Social History Main Topics  . Smoking status: Current Every Day Smoker    Packs/day: 0.50    Years: 20.00  Types: Cigarettes  . Smokeless tobacco: Former Neurosurgeon    Types: Snuff  . Alcohol use 3.6 oz/week    6 Cans of beer per week     Comment: 04/27/12- "amount varies"  . Drug use: No  . Sexual activity: No   Other Topics Concern  . Not on file   Social History Narrative   ** Merged History Encounter **       Family History  Problem Relation Age of Onset  . Diabetes type I Mother   . Hypertension Mother   . Arthritis Mother   . Stroke Sister   . Diabetes type I Brother   . Hypertension Brother   . Healthy Brother   . Diabetes type II Brother   . Heart disease Brother   . Healthy Brother   . Healthy Brother   . Diabetes  type I Sister   . Hypertension Sister   . Asthma Sister   . Bronchitis Sister       VITAL SIGNS BP (!) 142/86   Pulse 78   Temp 98.7 F (37.1 C) (Oral)   Resp 20   Patient's Medications  New Prescriptions   No medications on file  Previous Medications   ALBUTEROL (PROVENTIL HFA;VENTOLIN HFA) 108 (90 BASE) MCG/ACT INHALER    1 to 2 puffs every 4 to 6 hours as needed   AMLODIPINE (NORVASC) 10 MG TABLET    Take 1 tablet (10 mg total) by mouth daily.   ASPIRIN EC 81 MG EC TABLET    Take 1 tablet (81 mg total) by mouth daily.   ATORVASTATIN (LIPITOR) 20 MG TABLET    Take 1 tablet (20 mg total) by mouth daily at 6 PM.   BACLOFEN (LIORESAL) 10 MG TABLET    Take 10 mg by mouth 3 (three) times daily as needed for muscle spasms.    DONEPEZIL (ARICEPT) 10 MG TABLET    Take 10 mg by mouth at bedtime.   HYDRALAZINE (APRESOLINE) 50 MG TABLET    Take 1 tablet (50 mg total) by mouth every 8 (eight) hours.   HYDROCODONE-ACETAMINOPHEN (NORCO/VICODIN) 5-325 MG TABLET    Take 1 tablet by mouth every 8 (eight) hours as needed for moderate pain or severe pain.   HYDROXYZINE (VISTARIL) 25 MG CAPSULE    Take 25 mg by mouth at bedtime.   METOPROLOL SUCCINATE (TOPROL-XL) 100 MG 24 HR TABLET    Take 1 tablet (100 mg total) by mouth daily.   QUETIAPINE (SEROQUEL) 25 MG TABLET    Take 1/2 tablet at night  Modified Medications   No medications on file  Discontinued Medications   DONEPEZIL (ARICEPT) 10 MG TABLET    Take 1/2 tablet daily for 1 month, then increase to 1 tablet daily     SIGNIFICANT DIAGNOSTIC EXAMS  03-13-15: chest x-ray: cardiomegaly with patchy right lung infiltrates    LABS REVIEWED:   02-28-15: hgb 13.3; hct 43.8  glucose 82; bun 18.5; creat 0.72; k+ 4.4; na++140; hgb a1c 5.9 12-12-15: wbc 7.3; hgb 12.6; hct 39.8; mcv 78.7 plt 183; glucose 88; bun 18; creat 1.00; k+ 4.1; na++139    Review of Systems  Constitutional: Negative for fatigue.  HENT: Negative for congestion.     Respiratory: Negative for cough, chest tightness and shortness of breath.   Cardiovascular: Negative for chest pain, palpitations and leg swelling.  Gastrointestinal: Negative for abdominal pain and constipation.  Musculoskeletal: Negative for myalgias and arthralgias.  Skin: Negative for pallor.  Neurological:  Negative for dizziness.  Psychiatric/Behavioral: The patient is not nervous/anxious.       Physical Exam Vitals reviewed. Constitutional: No distress.  Obese   Eyes: Conjunctivae are normal.  Neck: Neck supple. No JVD present. No thyromegaly present.  Cardiovascular: Normal rate, regular rhythm and intact distal pulses.   Respiratory: Effort normal. No respiratory distress.  Diminished breath sounds  Is 02 dependent   GI: Soft. Bowel sounds are normal. She exhibits no distension. There is no tenderness.  Musculoskeletal: She exhibits no edema.  Able to move all extremities   Lymphadenopathy:    She has no cervical adenopathy.  Neurological:  Lethargic   Skin: Skin is warm and dry. She is not diaphoretic.     ASSESSMENT/ PLAN:  1. Diastolic heart failure:  EF 55-60% (02-20-15); will continue hydralazine 50 mg three times daily   2. Hypertension: will continue hydralazine 50 mg three times daily toprol xl 100 mg daily norvasc 10 mg daily  asa 81 mg daily   3. CAD: is status post stent; cath (10/2014): no complaint of chest pain present; will continue asa 81 mg daily   4. CVA: is nueroloigcally stable; will continue asa 81 mg daily will continue baclofen 10 mg three times daily for spasticity has vicodin 5/325 mg every 6 hours as needed for pain   5. COPD: is 02 dependent; will continue albuterol 2 puffs every 6 hours as needed  6.  Dyslipidemia: will continue lipitor 20 mg daily   7. Dementia: no significant change in her status; will continue aricept 10 mg daily   8. Psychosis: will continue seroquel 12.5 mg nightly and will continue vistaril 25 mg nightly for  anxiety; will have her seen psych services  9. Chronic pain: will continue baclofen 10 mg three times daily and will continue vicodin 5.325 mg every 6 hours as needed    Will check cbc; cmp; lipids; hgb a1c and tsh   Time spent with patient  50  minutes >50% time spent counseling; reviewing medical record; tests; labs; and developing future plan of care   MD is aware of resident's narcotic use and is in agreement with current plan of care. We will attempt to wean resident as apropriate   Synthia Innocenteborah Rosario Duey NP [facility]  Contact [phone] Monday through Friday 8am- 5pm  After hours call [phone]

## 2016-02-09 LAB — BASIC METABOLIC PANEL
BUN: 19 mg/dL (ref 4–21)
Creatinine: 0.6 mg/dL (ref 0.5–1.1)
Glucose: 106 mg/dL
Potassium: 4.3 mmol/L (ref 3.4–5.3)
SODIUM: 143 mmol/L (ref 137–147)

## 2016-02-09 LAB — CBC AND DIFFERENTIAL
HEMATOCRIT: 41 % (ref 36–46)
HEMOGLOBIN: 12.3 g/dL (ref 12.0–16.0)
PLATELETS: 202 10*3/uL (ref 150–399)
WBC: 10.1 10^3/mL

## 2016-02-14 ENCOUNTER — Non-Acute Institutional Stay (SKILLED_NURSING_FACILITY): Payer: Medicaid Other | Admitting: Internal Medicine

## 2016-02-14 ENCOUNTER — Encounter: Payer: Self-pay | Admitting: Internal Medicine

## 2016-02-14 DIAGNOSIS — I5032 Chronic diastolic (congestive) heart failure: Secondary | ICD-10-CM

## 2016-02-14 DIAGNOSIS — J42 Unspecified chronic bronchitis: Secondary | ICD-10-CM

## 2016-02-14 DIAGNOSIS — J9611 Chronic respiratory failure with hypoxia: Secondary | ICD-10-CM | POA: Diagnosis not present

## 2016-02-14 DIAGNOSIS — G894 Chronic pain syndrome: Secondary | ICD-10-CM | POA: Diagnosis not present

## 2016-02-14 DIAGNOSIS — F0391 Unspecified dementia with behavioral disturbance: Secondary | ICD-10-CM

## 2016-02-14 DIAGNOSIS — I6930 Unspecified sequelae of cerebral infarction: Secondary | ICD-10-CM

## 2016-02-14 DIAGNOSIS — I25118 Atherosclerotic heart disease of native coronary artery with other forms of angina pectoris: Secondary | ICD-10-CM | POA: Diagnosis not present

## 2016-02-14 DIAGNOSIS — I10 Essential (primary) hypertension: Secondary | ICD-10-CM | POA: Diagnosis not present

## 2016-02-14 DIAGNOSIS — R441 Visual hallucinations: Secondary | ICD-10-CM | POA: Diagnosis not present

## 2016-02-14 DIAGNOSIS — M159 Polyosteoarthritis, unspecified: Secondary | ICD-10-CM

## 2016-02-14 DIAGNOSIS — F03B18 Unspecified dementia, moderate, with other behavioral disturbance: Secondary | ICD-10-CM

## 2016-02-14 DIAGNOSIS — E782 Mixed hyperlipidemia: Secondary | ICD-10-CM | POA: Diagnosis not present

## 2016-02-14 DIAGNOSIS — M150 Primary generalized (osteo)arthritis: Secondary | ICD-10-CM

## 2016-02-14 NOTE — Progress Notes (Signed)
Patient ID: [name], female   DOB: 04-Feb-1956, 60 y.o.   MRN: [id number]    HISTORY AND PHYSICAL   DATE:  02/14/16  Location:    [place] Room Number: 208 [id number] Place of Service: SNF (31)   Extended Emergency Contact Information Primary Emergency Contact: Butler,Amy Address: [place], [place] [address] [address] [place] Phone: [phone] Mobile Phone: [phone] Relation: Daughter Secondary Emergency Contact: Tawana Scale [place] of [place] Phone: [phone] Mobile Phone: [phone] Relation: Daughter  Advanced Directive information Does patient have an advance directive?: No, Would patient like information on creating an advanced directive?: No - patient declined information  Chief Complaint  Patient presents with  . New Admit To SNF    HPI:  60 yo female seen today as a new admission into SNF from another SNF. 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 She is bowel incontinent. She is a poor historian due to dementia. Hx obtained from chart. No nursing issues. No falls. She has no concerns.  Chronic Diastolic heart failure - EF 55-60% (02-20-15); takes hydralazine 50 mg three times daily   Hypertension - BP stable on hydralazine 50 mg three times daily; toprol xl 100 mg daily; norvasc 10 mg daily; takes ASA 81 mg daily   CAD - s/p cardiac cath and stent(10/2014) - no chest pain present; takes ASA 81 mg daily   Hx CVA - stable. Takes ASA 81 mg daily; baclofen 10 mg three times daily for spasticity and vicodin 5/325 mg every 6 hours as needed for pain   COPD with chronic respiratory failure- O2 dependent. Takes albuterol 2 puffs every 6 hours as needed  Hyperlipidemia - stable on lipitor 20 mg daily   Dementia/psychosis - cognition stable on aricept 10 mg daily.  Mood/hallucinations managed with seroquel 12.5 mg nightly and vistaril 25 mg nightly for anxiety; psych services to follow. Albumin 3.7  Chronic pain syndrome/OA/muscle spasticity - stable on baclofen 10 mg three times daily and vicodin 5.325 mg every 6 hours prn. Narcotic use reviewed and current benefits outweigh risks at this time    Past Medical History:  Diagnosis Date  . Accelerated hypertension   . Arthritis   . Asthma   . Bilateral chronic knee pain   . Chronic bronchitis ([place])   . Coronary artery disease    a. remote hx of cath ~2000. B. cath  moderate disease in an RPL branch - too distal for PCI; 3rd RPLB lesion, 50% stenosed; hyperdynamic LV with near cavity obliteration & severely elevated LVEDP  . Dementia   . Diastolic CHF ([place])   . Diverticular disease of large intestine    diverticular abscess in sigmoid region by CT 2009  . Heart attack ([place])   . Heart murmur   . Hypertension   . Non-ischemic cardiomyopathy ([place])    cxr - mild cardiomegly, Echo 10/07/14 - severe LVH with LVEF 65-70%.  . Stroke ([place])   . Wheelchair bound     Past Surgical History:  Procedure Laterality Date  . ABDOMINAL HYSTERECTOMY    . AMPUTATION  08/21/2011   Procedure: AMPUTATION DIGIT;  Surgeon: Linna Hoff, MD;  Location: [place];  Service: Orthopedics;  Laterality: Right;  . CARDIAC CATHETERIZATION N/A 10/09/2014   Procedure: Left Heart Cath and  Coronary Angiography;  Surgeon: Leonie Man, MD;  Location: [place] CV LAB CUPID;  Service: Cardiovascular;  Laterality: N/A;  . CARDIAC CATHETERIZATION  10/2014   normal coronary arteries aside for a distal RPLB branch with 50% stenosis  . CORONARY STENT PLACEMENT    . FLEXIBLE SIGMOIDOSCOPY  04/28/2012   Procedure: FLEXIBLE SIGMOIDOSCOPY;  Surgeon: Jeryl Columbia, MD;  Location: [facility];  Service: Endoscopy;  Laterality: N/A;  unsedated, unprepped  . FLEXIBLE SIGMOIDOSCOPY N/A 12/10/2015   Procedure: FLEXIBLE SIGMOIDOSCOPY;  Surgeon:  Ronald Lobo, MD;  Location: [facility];  Service: Endoscopy;  Laterality: N/A;  . I&D EXTREMITY  08/18/2011   Procedure: IRRIGATION AND DEBRIDEMENT EXTREMITY;  Surgeon: Linna Hoff, MD;  Location: [place];  Service: Orthopedics;  Laterality: Right;  I&D Right Long Finger    Patient Care Team: Benito Mccreedy, MD as PCP - General (Internal Medicine)  Social History   Social History  . Marital status: Legally Separated    Spouse name: N/A  . Number of children: N/A  . Years of education: N/A   Occupational History  . Not on file.   Social History Main Topics  . Smoking status: Current Every Day Smoker    Packs/day: 0.50    Years: 20.00    Types: Cigarettes  . Smokeless tobacco: Former Systems developer    Types: Snuff  . Alcohol use 3.6 oz/week    6 Cans of beer per week     Comment: 04/27/12- "amount varies"  . Drug use: No  . Sexual activity: No   Other Topics Concern  . Not on file   Social History Narrative   ** Merged History Encounter **         reports that she has been smoking Cigarettes.  She has a 10.00 pack-year smoking history. She has quit using smokeless tobacco. Her smokeless tobacco use included Snuff. She reports that she drinks about 3.6 oz of alcohol per week . She reports that she does not use drugs.  Family History  Problem Relation Age of Onset  . Diabetes type I Mother   . Hypertension Mother   . Arthritis Mother   . Stroke Sister   . Diabetes type I Brother   . Hypertension Brother   . Healthy Brother   . Diabetes type II Brother   . Heart disease Brother   . Healthy Brother   . Healthy Brother   . Diabetes type I Sister   . Hypertension Sister   . Asthma Sister   . Bronchitis Sister    Family Status  Relation Status  . Mother Deceased  . Father Deceased   UNKNOWN  . Sister Alive  . Brother Deceased  . Brother Deceased   HEALTH Hx UNKNOWN  . Brother Alive  . Brother Alive  . Brother Alive  . Brother Alive  . Sister Deceased     Immunization History  Administered Date(s) Administered  . Tdap 12/05/2014    Allergies  Allergen Reactions  . Penicillins Itching and Nausea And Vomiting    Has patient had a PCN reaction causing immediate rash, facial/tongue/throat swelling, SOB or lightheadedness with hypotension: No Has patient had a PCN reaction causing severe rash involving mucus membranes or skin necrosis: No Has patient had a PCN reaction that required hospitalization: No Has patient had a PCN reaction occurring within the last 10 years: No      Medications: Patient's Medications  New Prescriptions   No medications on file  Previous  Medications   ALBUTEROL (PROVENTIL HFA;VENTOLIN HFA) 108 (90 BASE) MCG/ACT INHALER    1 to 2 puffs every 4 to 6 hours as needed   AMLODIPINE (NORVASC) 10 MG TABLET    Take 1 tablet (10 mg total) by mouth daily.   ASPIRIN EC 81 MG EC TABLET    Take 1 tablet (81 mg total) by mouth daily.   ATORVASTATIN (LIPITOR) 20 MG TABLET    Take 1 tablet (20 mg total) by mouth daily at 6 PM.   BACLOFEN (LIORESAL) 10 MG TABLET    Take 10 mg by mouth 3 (three) times daily as needed for muscle spasms.    DONEPEZIL (ARICEPT) 10 MG TABLET    Take 10 mg by mouth at bedtime.   HYDRALAZINE (APRESOLINE) 50 MG TABLET    Take 1 tablet (50 mg total) by mouth every 8 (eight) hours.   HYDROCODONE-ACETAMINOPHEN (NORCO/VICODIN) 5-325 MG TABLET    Take 1 tablet by mouth every 6 (six) hours as needed for moderate pain.   HYDROXYZINE (VISTARIL) 25 MG CAPSULE    Take 25 mg by mouth at bedtime.   METOPROLOL SUCCINATE (TOPROL-XL) 100 MG 24 HR TABLET    Take 1 tablet (100 mg total) by mouth daily.   QUETIAPINE (SEROQUEL) 25 MG TABLET    Take 1/2 tablet at night  Modified Medications   No medications on file  Discontinued Medications   HYDROCODONE-ACETAMINOPHEN (NORCO/VICODIN) 5-325 MG TABLET    Take 1 tablet by mouth every 8 (eight) hours as needed for moderate pain or severe pain.    Review of Systems   Unable to perform ROS: Dementia    Vitals:   02/14/16 1203  BP: (!) 142/86  Pulse: 78  Resp: 20  Temp: 98.7 F (37.1 C)  TempSrc: Oral  Weight: 268 lb 6.4 oz (121.7 kg)  Height: '4\' 9"'$  (1.448 m)   Body mass index is 58.08 kg/m.  Physical Exam  Constitutional: She appears well-developed and well-nourished.  Sitting up in bed in NAD; angry and not speaking  HENT:  Mouth/Throat: Oropharynx is clear and moist. No oropharyngeal exudate.  Refused  Eyes: Pupils are equal, round, and reactive to light. No scleral icterus.  Neck: Neck supple. Carotid bruit is not present. No tracheal deviation present. No thyromegaly present.  Cardiovascular: Normal rate, regular rhythm and intact distal pulses.  Exam reveals no gallop and no friction rub.   Murmur (1/6 SEM) heard. No LE edema b/l. no calf TTP.   Pulmonary/Chest: Effort normal and breath sounds normal. No stridor. No respiratory distress. She has no wheezes. She has no rales.  Abdominal: Soft. Bowel sounds are normal. She exhibits no distension and no mass. There is no hepatomegaly. There is no tenderness. There is no rebound and no guarding.  Musculoskeletal: She exhibits edema.  Muscle spasticity  Lymphadenopathy:    She has no cervical adenopathy.  Neurological: She is alert.  Skin: Skin is warm and dry. No rash noted.  Peeling skin left ankle but no rash  Psychiatric: Her behavior is normal. Her affect is angry. Cognition and memory are impaired.     Labs reviewed: Nursing Home on 02/14/2016  Component Date Value Ref Range Status  . Hemoglobin 02/09/2016 12.3  12.0 - 16.0 g/dL Final  . HCT 02/09/2016 41  36 - 46 % Final  . Platelets 02/09/2016 202  150 - 399 K/L Final  . WBC 02/09/2016 10.1  10^3/mL Final  . Glucose 02/09/2016 106  mg/dL Final  .  BUN 02/09/2016 19  4 - 21 mg/dL Final  . Creatinine 02/09/2016 0.6  0.5 - 1.1 mg/dL Final  . Potassium 02/09/2016 4.3  3.4 - 5.3 mmol/L Final  . Sodium 02/09/2016 143  137 -  147 mmol/L Final  Admission on 12/01/2015, Discharged on 12/12/2015  No results displayed because visit has over 200 results.    Admission on 11/19/2015, Discharged on 11/20/2015  Component Date Value Ref Range Status  . Lipase 11/19/2015 33  11 - 51 U/L Final  . Sodium 11/19/2015 139  135 - 145 mmol/L Final  . Potassium 11/19/2015 4.1  3.5 - 5.1 mmol/L Final  . Chloride 11/19/2015 109  101 - 111 mmol/L Final  . CO2 11/19/2015 26  22 - 32 mmol/L Final  . Glucose, Bld 11/19/2015 97  65 - 99 mg/dL Final  . BUN 11/19/2015 24* 6 - 20 mg/dL Final  . Creatinine, Ser 11/19/2015 0.91  0.44 - 1.00 mg/dL Final  . Calcium 11/19/2015 9.0  8.9 - 10.3 mg/dL Final  . Total Protein 11/19/2015 7.4  6.5 - 8.1 g/dL Final  . Albumin 11/19/2015 3.7  3.5 - 5.0 g/dL Final  . AST 11/19/2015 18  15 - 41 U/L Final  . ALT 11/19/2015 14  14 - 54 U/L Final  . Alkaline Phosphatase 11/19/2015 65  38 - 126 U/L Final  . Total Bilirubin 11/19/2015 0.4  0.3 - 1.2 mg/dL Final  . GFR calc non Af Amer 11/19/2015 >60  >60 mL/min Final  . GFR calc Af Amer 11/19/2015 >60  >60 mL/min Final   Comment: (NOTE) The eGFR has been calculated using the CKD EPI equation. This calculation has not been validated in all clinical situations. eGFR's persistently <60 mL/min signify possible Chronic Kidney Disease.   . Anion gap 11/19/2015 4* 5 - 15 Final  . WBC 11/19/2015 8.0  4.0 - 10.5 K/uL Final  . RBC 11/19/2015 5.08  3.87 - 5.11 MIL/uL Final  . Hemoglobin 11/19/2015 12.8  12.0 - 15.0 g/dL Final  . HCT 11/19/2015 39.5  36.0 - 46.0 % Final  . MCV 11/19/2015 77.8* 78.0 - 100.0 fL Final  . MCH 11/19/2015 25.2* 26.0 - 34.0 pg Final  . MCHC 11/19/2015 32.4  30.0 - 36.0 g/dL Final  . RDW 11/19/2015 16.3* 11.5 - 15.5 % Final  . Platelets 11/19/2015 143* 150 - 400 K/uL Final  . Color, Urine 11/19/2015 YELLOW  YELLOW Final  . APPearance 11/19/2015 CLEAR  CLEAR Final  . Specific Gravity, Urine 11/19/2015 1.030  1.005 - 1.030 Final  .  pH 11/19/2015 5.5  5.0 - 8.0 Final  . Glucose, UA 11/19/2015 NEGATIVE  NEGATIVE mg/dL Final  . Hgb urine dipstick 11/19/2015 NEGATIVE  NEGATIVE Final  . Bilirubin Urine 11/19/2015 NEGATIVE  NEGATIVE Final  . Ketones, ur 11/19/2015 NEGATIVE  NEGATIVE mg/dL Final  . Protein, ur 11/19/2015 30* NEGATIVE mg/dL Final  . Nitrite 11/19/2015 NEGATIVE  NEGATIVE Final  . Leukocytes, UA 11/19/2015 NEGATIVE  NEGATIVE Final  . Squamous Epithelial / LPF 11/19/2015 0-5* NONE SEEN Final  . WBC, UA 11/19/2015 NONE SEEN  0 - 5 WBC/hpf Final  . RBC / HPF 11/19/2015 NONE SEEN  0 - 5 RBC/hpf Final  . Bacteria, UA 11/19/2015 RARE* NONE SEEN Final  . Casts 11/19/2015 HYALINE CASTS* NEGATIVE Final  . Urine-Other 11/19/2015 MUCOUS PRESENT   Final    No results found.   Assessment/Plan   ICD-9-CM ICD-10-CM   1. Chronic respiratory failure with hypoxia (HCC)  518.83 J96.11    799.02    2. Chronic bronchitis, unspecified chronic bronchitis type ([place]) 491.9 J42   3. Moderate dementia with behavioral disturbance 294.21 F03.91   4. History of CVA with residual deficit 438.9 I69.30   5. Visual hallucinations 368.16 R44.1    with psychosis  6. Chronic pain syndrome 338.4 G89.4   7. Primary osteoarthritis involving multiple joints 715.09 M15.0   8. Chronic diastolic heart failure (HCC) 428.32 I50.32   9. Mixed hyperlipidemia 272.2 E78.2   10. Benign essential HTN 401.1 I10   11. Coronary artery disease involving native coronary artery of native heart with other form of angina pectoris ([place])  I25.118      Cont current meds as ordered  Psych services to follow  PT/OT/ST as ordered  Labs pending  GOAL: short term rehab followed by long term care. Communicated with pt and nursing.  Will follow  Domanick Cuccia S. Perlie Gold  [facility] [address] [place], [place] [address] [phone] Cell (Monday-Friday 8 AM - 5 PM) [phone] After 5 PM and follow  prompts

## 2016-02-22 ENCOUNTER — Ambulatory Visit: Payer: Medicaid Other | Admitting: Neurology

## 2016-03-12 ENCOUNTER — Encounter: Payer: Self-pay | Admitting: Adult Health

## 2016-03-12 ENCOUNTER — Non-Acute Institutional Stay (SKILLED_NURSING_FACILITY): Payer: Medicaid Other | Admitting: Adult Health

## 2016-03-12 DIAGNOSIS — I25118 Atherosclerotic heart disease of native coronary artery with other forms of angina pectoris: Secondary | ICD-10-CM

## 2016-03-12 DIAGNOSIS — I639 Cerebral infarction, unspecified: Secondary | ICD-10-CM

## 2016-03-12 DIAGNOSIS — F0391 Unspecified dementia with behavioral disturbance: Secondary | ICD-10-CM | POA: Diagnosis not present

## 2016-03-12 DIAGNOSIS — J449 Chronic obstructive pulmonary disease, unspecified: Secondary | ICD-10-CM | POA: Diagnosis not present

## 2016-03-12 DIAGNOSIS — I5032 Chronic diastolic (congestive) heart failure: Secondary | ICD-10-CM

## 2016-03-12 DIAGNOSIS — E782 Mixed hyperlipidemia: Secondary | ICD-10-CM | POA: Diagnosis not present

## 2016-03-12 DIAGNOSIS — I110 Hypertensive heart disease with heart failure: Secondary | ICD-10-CM

## 2016-03-12 DIAGNOSIS — G894 Chronic pain syndrome: Secondary | ICD-10-CM | POA: Diagnosis not present

## 2016-03-12 DIAGNOSIS — F03B18 Unspecified dementia, moderate, with other behavioral disturbance: Secondary | ICD-10-CM

## 2016-03-12 NOTE — Progress Notes (Signed)
Patient ID: [name], female   DOB: 1956/05/18, 60 y.o.   MRN: [id number]   Location:   [facility] Room Number: 208-A Place of Service:  SNF (31)   CODE STATUS: Full Code  Allergies  Allergen Reactions  . Penicillins Itching and Nausea And Vomiting    Chief Complaint  Patient presents with  . Medical Management of Chronic Issues    Follow up    HPI:  She is a long term resident of this facility being seen for the management of her chronic illnesses. Overall her status is without change. She is being seen by therapy. Today she is not very talkative.  There are no nursing concerns at this time.    Past Medical History:  Diagnosis Date  . Accelerated hypertension   . Arthritis   . Asthma   . Bilateral chronic knee pain   . Chronic bronchitis (HCC)   . Coronary artery disease    a. remote hx of cath ~2000. B. cath  moderate disease in an RPL branch - too distal for PCI; 3rd RPLB lesion, 50% stenosed; hyperdynamic LV with near cavity obliteration & severely elevated LVEDP  . Dementia   . Diastolic CHF (HCC)   . Diverticular disease of large intestine    diverticular abscess in sigmoid region by CT 2009  . Heart attack   . Heart murmur   . Hypertension   . Non-ischemic cardiomyopathy (HCC)    cxr - mild cardiomegly, Echo 10/07/14 - severe LVH with LVEF 65-70%.  . Stroke (HCC)   . Wheelchair bound     Past Surgical History:  Procedure Laterality Date  . ABDOMINAL HYSTERECTOMY    . AMPUTATION  08/21/2011   Procedure: AMPUTATION DIGIT;  Surgeon: Sharma Covert, MD;  Location: [facility] OR;  Service: Orthopedics;  Laterality: Right;  . CARDIAC CATHETERIZATION N/A 10/09/2014   Procedure: Left Heart Cath and Coronary Angiography;  Surgeon: Marykay Lex, MD;  Location: [facility] INVASIVE CV LAB CUPID;  Service: Cardiovascular;  Laterality: N/A;  . CARDIAC CATHETERIZATION  10/2014   normal coronary arteries aside for a distal RPLB branch with 50% stenosis  . CORONARY  STENT PLACEMENT    . FLEXIBLE SIGMOIDOSCOPY  04/28/2012   Procedure: FLEXIBLE SIGMOIDOSCOPY;  Surgeon: Petra Kuba, MD;  Location: [facility] ENDOSCOPY;  Service: Endoscopy;  Laterality: N/A;  unsedated, unprepped  . FLEXIBLE SIGMOIDOSCOPY N/A 12/10/2015   Procedure: FLEXIBLE SIGMOIDOSCOPY;  Surgeon: Bernette Redbird, MD;  Location: [facility]OSCOPY;  Service: Endoscopy;  Laterality: N/A;  . I&D EXTREMITY  08/18/2011   Procedure: IRRIGATION AND DEBRIDEMENT EXTREMITY;  Surgeon: Sharma Covert, MD;  Location: [facility] OR;  Service: Orthopedics;  Laterality: Right;  I&D Right Long Finger    Social History   Social History  . Marital status: Legally Separated    Spouse name: N/A  . Number of children: N/A  . Years of education: N/A   Occupational History  . Not on file.   Social History Main Topics  . Smoking status: Current Every Day Smoker    Packs/day: 0.50    Years: 20.00    Types: Cigarettes  . Smokeless tobacco: Former Neurosurgeon    Types: Snuff  . Alcohol use 3.6 oz/week    6 Cans of beer per week     Comment: 04/27/12- "amount varies"  . Drug use: No  . Sexual activity: No   Other Topics Concern  . Not on file   Social History Narrative   **  Merged History Encounter **       Family History  Problem Relation Age of Onset  . Diabetes type I Mother   . Hypertension Mother   . Arthritis Mother   . Stroke Sister   . Diabetes type I Brother   . Hypertension Brother   . Healthy Brother   . Diabetes type II Brother   . Heart disease Brother   . Healthy Brother   . Healthy Brother   . Diabetes type I Sister   . Hypertension Sister   . Asthma Sister   . Bronchitis Sister       VITAL SIGNS BP (!) 142/86   Pulse 78   Temp 98.8 F (37.1 C) (Oral)   Resp 20   Ht 4\' 9"  (1.448 m)   Wt 271 lb 8 oz (123.2 kg)   BMI 58.75 kg/m   Patient's Medications  New Prescriptions   No medications on file  Previous Medications   ALBUTEROL (PROVENTIL HFA;VENTOLIN HFA) 108 (90 BASE) MCG/ACT  INHALER    1 to 2 puffs every 4 to 6 hours as needed   AMLODIPINE (NORVASC) 10 MG TABLET    Take 1 tablet (10 mg total) by mouth daily.   ASPIRIN EC 81 MG EC TABLET    Take 1 tablet (81 mg total) by mouth daily.   ATORVASTATIN (LIPITOR) 20 MG TABLET    Take 1 tablet (20 mg total) by mouth daily at 6 PM.   BACLOFEN (LIORESAL) 10 MG TABLET    Take 10 mg by mouth 3 (three) times daily as needed for muscle spasms.    DIVALPROEX (DEPAKOTE SPRINKLE) 125 MG CAPSULE    Take 125 mg by mouth 2 (two) times daily.   DONEPEZIL (ARICEPT) 10 MG TABLET    Take 10 mg by mouth at bedtime.   HYDRALAZINE (APRESOLINE) 50 MG TABLET    Take 1 tablet (50 mg total) by mouth every 8 (eight) hours.   HYDROCODONE-ACETAMINOPHEN (NORCO/VICODIN) 5-325 MG TABLET    Take 1 tablet by mouth every 6 (six) hours as needed for moderate pain.   HYDROXYZINE (VISTARIL) 25 MG CAPSULE    Take 25 mg by mouth at bedtime.   METOPROLOL SUCCINATE (TOPROL-XL) 100 MG 24 HR TABLET    Take 1 tablet (100 mg total) by mouth daily.   QUETIAPINE (SEROQUEL) 25 MG TABLET    Take 1/2 tablet at night   TRAZODONE (DESYREL) 50 MG TABLET    Take 50 mg by mouth at bedtime.  Modified Medications   No medications on file  Discontinued Medications   No medications on file     SIGNIFICANT DIAGNOSTIC EXAMS  03-13-15: chest x-ray: cardiomegaly with patchy right lung infiltrates   02-22-16: chest x-ray: modest cardiomegaly with clear lungs; no TB seen   LABS REVIEWED:   12-12-15: wbc 7.3; hgb 12.6; hct 39.8; mcv 78.7 plt 183; glucose 88; bun 18; creat 1.00; k+ 4.1; na++139 02-09-16: wbc 10.1; hgb 12.3; hct 41.1; mcv 79.0; plt 202; glucose 106; bun 18.6;creat 0.64; k+ 4.3; na++ 1143; liver normal albumin 4.3; tsh 1.32; hgb a1c 6.3; chol 165; ldl 70; trig 158; hdl 63    Review of Systems  Unable to perform ROS: Other      Physical Exam Vitals reviewed. Constitutional: No distress.  Obese   Eyes: Conjunctivae are normal.  Neck: Neck supple. No JVD  present. No thyromegaly present.  Cardiovascular: Normal rate, regular rhythm and intact distal pulses.   Respiratory: Effort normal.  No respiratory distress.  Diminished breath sounds  Is 02 dependent   GI: Soft. Bowel sounds are normal. She exhibits no distension. There is no tenderness.  Musculoskeletal: She exhibits no edema.  Able to move all extremities   Lymphadenopathy:    She has no cervical adenopathy.  Neurological:  Lethargic   Skin: Skin is warm and dry. She is not diaphoretic.     ASSESSMENT/ PLAN:  1. Diastolic heart failure:  EF 55-60% (02-20-15); will continue hydralazine 50 mg three times daily   2. Hypertension: will continue hydralazine 50 mg three times daily toprol xl 100 mg daily norvasc 10 mg daily  asa 81 mg daily   3. CAD: is status post stent; cath (10/2014): no complaint of chest pain present; will continue asa 81 mg daily   4. CVA: is nueroloigcally stable; will continue asa 81 mg daily will continue baclofen 10 mg three times daily for spasticity has vicodin 5/325 mg every 6 hours as needed for pain   5. COPD: is 02 dependent; will continue albuterol 2 puffs every 6 hours as needed  6.  Dyslipidemia: will continue lipitor 20 mg daily ldl is 70  7. Dementia: no significant change in her status; will continue aricept 10 mg nightly    8. Psychosis: will continue seroquel 12.5 mg nightly and will continue vistaril 25 mg nightly for anxiety;   9. Chronic pain: will continue baclofen 10 mg three times daily and will continue vicodin 5/325 mg every 6 hours as needed     MD is aware of resident's narcotic use and is in agreement with current plan of care. We will attempt to wean resident as apropriate   Synthia Innocenteborah Ernesha Ramone NP [facility]  Contact [phone] Monday through Friday 8am- 5pm  After hours call [phone]

## 2016-04-11 ENCOUNTER — Non-Acute Institutional Stay (SKILLED_NURSING_FACILITY): Payer: Medicaid Other | Admitting: Adult Health

## 2016-04-11 ENCOUNTER — Encounter: Payer: Self-pay | Admitting: Adult Health

## 2016-04-11 DIAGNOSIS — F32A Depression, unspecified: Secondary | ICD-10-CM | POA: Insufficient documentation

## 2016-04-11 DIAGNOSIS — F03B18 Unspecified dementia, moderate, with other behavioral disturbance: Secondary | ICD-10-CM

## 2016-04-11 DIAGNOSIS — I639 Cerebral infarction, unspecified: Secondary | ICD-10-CM

## 2016-04-11 DIAGNOSIS — I110 Hypertensive heart disease with heart failure: Secondary | ICD-10-CM | POA: Diagnosis not present

## 2016-04-11 DIAGNOSIS — E782 Mixed hyperlipidemia: Secondary | ICD-10-CM

## 2016-04-11 DIAGNOSIS — G894 Chronic pain syndrome: Secondary | ICD-10-CM

## 2016-04-11 DIAGNOSIS — F0391 Unspecified dementia with behavioral disturbance: Secondary | ICD-10-CM | POA: Diagnosis not present

## 2016-04-11 DIAGNOSIS — J449 Chronic obstructive pulmonary disease, unspecified: Secondary | ICD-10-CM | POA: Diagnosis not present

## 2016-04-11 DIAGNOSIS — I25118 Atherosclerotic heart disease of native coronary artery with other forms of angina pectoris: Secondary | ICD-10-CM | POA: Diagnosis not present

## 2016-04-11 DIAGNOSIS — F332 Major depressive disorder, recurrent severe without psychotic features: Secondary | ICD-10-CM | POA: Diagnosis not present

## 2016-04-11 DIAGNOSIS — I5032 Chronic diastolic (congestive) heart failure: Secondary | ICD-10-CM | POA: Diagnosis not present

## 2016-04-11 DIAGNOSIS — F329 Major depressive disorder, single episode, unspecified: Secondary | ICD-10-CM | POA: Insufficient documentation

## 2016-04-11 NOTE — Progress Notes (Signed)
Patient ID: Robin FolksBrenda K Nicklas, female   DOB: 12/26/1955, 60 y.o.   MRN: [id number]   Location:   [facility] Room Number: 208-A Place of Service:  SNF (31)   CODE STATUS: Full Code  Allergies  Allergen Reactions  . Penicillins Itching and Nausea And Vomiting    Chief Complaint  Patient presents with  . Medical Management of Chronic Issues    Follow up    HPI:  She is a long term resident of this facility being seen for the management of her chronic illnesses.  Overall there is little change in her status. She does get out of bed most days. She is unable to fully participate in the hpi or ros. There are no nursing concerns at this time.    Past Medical History:  Diagnosis Date  . Accelerated hypertension   . Arthritis   . Asthma   . Bilateral chronic knee pain   . Chronic bronchitis (HCC)   . Coronary artery disease    a. remote hx of cath ~2000. B. cath  moderate disease in an RPL branch - too distal for PCI; 3rd RPLB lesion, 50% stenosed; hyperdynamic LV with near cavity obliteration & severely elevated LVEDP  . Dementia   . Diastolic CHF (HCC)   . Diverticular disease of large intestine    diverticular abscess in sigmoid region by CT 2009  . Heart attack   . Heart murmur   . Hypertension   . Non-ischemic cardiomyopathy (HCC)    cxr - mild cardiomegly, Echo 10/07/14 - severe LVH with LVEF 65-70%.  . Stroke (HCC)   . Wheelchair bound     Past Surgical History:  Procedure Laterality Date  . ABDOMINAL HYSTERECTOMY    . AMPUTATION  08/21/2011   Procedure: AMPUTATION DIGIT;  Surgeon: Sharma CovertFred W Ortmann, MD;  [facility];  Service: Orthopedics;  Laterality: Right;  . CARDIAC CATHETERIZATION N/A 10/09/2014   Procedure: Left Heart Cath and Coronary Angiography;  Surgeon: Marykay Lexavid W Harding, MD;  [facility];  Service: Cardiovascular;  Laterality: N/A;  . CARDIAC CATHETERIZATION  10/2014   normal coronary arteries aside for a distal RPLB branch  with 50% stenosis  . CORONARY STENT PLACEMENT    . FLEXIBLE SIGMOIDOSCOPY  04/28/2012   Procedure: FLEXIBLE SIGMOIDOSCOPY;  Surgeon: Petra KubaMarc E Magod, MD;  Location: [facility]OSCOPY;  Service: Endoscopy;  Laterality: N/A;  unsedated, unprepped  . FLEXIBLE SIGMOIDOSCOPY N/A 12/10/2015   Procedure: FLEXIBLE SIGMOIDOSCOPY;  Surgeon: Bernette Redbirdobert Buccini, MD;  Location: [facility];  Service: Endoscopy;  Laterality: N/A;  . I&D EXTREMITY  08/18/2011   Procedure: IRRIGATION AND DEBRIDEMENT EXTREMITY;  Surgeon: Sharma CovertFred W Ortmann, MD;  Location: [facility] OR;  Service: Orthopedics;  Laterality: Right;  I&D Right Long Finger    Social History   Social History  . Marital status: Legally Separated    Spouse name: N/A  . Number of children: N/A  . Years of education: N/A   Occupational History  . Not on file.   Social History Main Topics  . Smoking status: Current Every Day Smoker    Packs/day: 0.50    Years: 20.00    Types: Cigarettes  . Smokeless tobacco: Former NeurosurgeonUser    Types: Snuff  . Alcohol use 3.6 oz/week    6 Cans of beer per week     Comment: 04/27/12- "amount varies"  . Drug use: No  . Sexual activity: No   Other Topics Concern  . Not on  file   Social History Narrative   ** Merged History Encounter **       Family History  Problem Relation Age of Onset  . Diabetes type I Mother   . Hypertension Mother   . Arthritis Mother   . Stroke Sister   . Diabetes type I Brother   . Hypertension Brother   . Healthy Brother   . Diabetes type II Brother   . Heart disease Brother   . Healthy Brother   . Healthy Brother   . Diabetes type I Sister   . Hypertension Sister   . Asthma Sister   . Bronchitis Sister       VITAL SIGNS BP (!) 148/67   Pulse 66   Temp 98.7 F (37.1 C) (Oral)   Resp 20   Ht 4\' 9"  (1.448 m)   Wt 272 lb 3 oz (123.5 kg)   BMI 58.90 kg/m   Patient's Medications  New Prescriptions   No medications on file  Previous Medications   ALBUTEROL (PROVENTIL HFA;VENTOLIN  HFA) 108 (90 BASE) MCG/ACT INHALER    1 to 2 puffs every 4 to 6 hours as needed   AMLODIPINE (NORVASC) 10 MG TABLET    Take 1 tablet (10 mg total) by mouth daily.   ASPIRIN EC 81 MG EC TABLET    Take 1 tablet (81 mg total) by mouth daily.   ATORVASTATIN (LIPITOR) 20 MG TABLET    Take 1 tablet (20 mg total) by mouth daily at 6 PM.   BACLOFEN (LIORESAL) 10 MG TABLET    Take 10 mg by mouth 3 (three) times daily as needed for muscle spasms.    DIVALPROEX (DEPAKOTE SPRINKLE) 125 MG CAPSULE    Take 125 mg by mouth 2 (two) times daily.   DONEPEZIL (ARICEPT) 10 MG TABLET    Take 10 mg by mouth at bedtime.   HYDRALAZINE (APRESOLINE) 50 MG TABLET    Take 1 tablet (50 mg total) by mouth every 8 (eight) hours.   HYDROCODONE-ACETAMINOPHEN (NORCO/VICODIN) 5-325 MG TABLET    Take 1 tablet by mouth every 6 (six) hours as needed for moderate pain.   HYDROXYZINE (VISTARIL) 25 MG CAPSULE    Take 25 mg by mouth at bedtime.   METOPROLOL SUCCINATE (TOPROL-XL) 100 MG 24 HR TABLET    Take 1 tablet (100 mg total) by mouth daily.   SERTRALINE (ZOLOFT) 25 MG TABLET    Take 37.5 mg by mouth daily.   TRAZODONE (DESYREL) 50 MG TABLET    Take 50 mg by mouth at bedtime.  Modified Medications   No medications on file  Discontinued Medications   QUETIAPINE (SEROQUEL) 25 MG TABLET    Take 1/2 tablet at night     SIGNIFICANT DIAGNOSTIC EXAMS  03-13-15: chest x-ray: cardiomegaly with patchy right lung infiltrates   02-22-16: chest x-ray: modest cardiomegaly with clear lungs; no TB seen   LABS REVIEWED:   12-12-15: wbc 7.3; hgb 12.6; hct 39.8; mcv 78.7 plt 183; glucose 88; bun 18; creat 1.00; k+ 4.1; na++139 02-09-16: wbc 10.1; hgb 12.3; hct 41.1; mcv 79.0; plt 202; glucose 106; bun 18.6;creat 0.64; k+ 4.3; na++ 1143; liver normal albumin 4.3; tsh 1.32; hgb a1c 6.3; chol 165; ldl 70; trig 158; hdl 63    Review of Systems  Unable to perform ROS: Other      Physical Exam Vitals reviewed. Constitutional: No distress.    Obese   Eyes: Conjunctivae are normal.  Neck: Neck supple. No  JVD present. No thyromegaly present.  Cardiovascular: Normal rate, regular rhythm and intact distal pulses.   Respiratory: Effort normal. No respiratory distress.  Diminished breath sounds  Is 02 dependent   GI: Soft. Bowel sounds are normal. She exhibits no distension. There is no tenderness.  Musculoskeletal: She exhibits no edema.  Able to move all extremities   Lymphadenopathy:    She has no cervical adenopathy.  Neurological:  Awake    Skin: Skin is warm and dry. She is not diaphoretic.     ASSESSMENT/ PLAN:  1. Diastolic heart failure:  EF 55-60% (02-20-15); will continue hydralazine 50 mg three times daily   2. Hypertension: will continue hydralazine 50 mg three times daily toprol xl 100 mg daily norvasc 10 mg daily  asa 81 mg daily   3. CAD: is status post stent; cath (10/2014): no complaint of chest pain present; will continue asa 81 mg daily   4. CVA: is nueroloigcally stable; will continue asa 81 mg daily will continue baclofen 10 mg three times daily as needed for spasticity has vicodin 5/325 mg every 6 hours as needed for pain   5. COPD: is 02 dependent; will continue albuterol 2 puffs every 6 hours as needed  6.  Dyslipidemia: will continue lipitor 20 mg daily ldl is 70  7. Dementia: no significant change in her status; will continue aricept 10 mg nightly    8. Depression with psychosis:  Is off the seroquel; will continue zoloft 37.5 mg daily and will continue trazodone 50 mg nightly   9. Chronic pain: will continue baclofen 10 mg three times daily as needed  and will continue vicodin 5/325 mg every 6 hours as needed     MD is aware of resident's narcotic use and is in agreement with current plan of care. We will attempt to wean resident as apropriate   Synthia Innocent NP [facility]  Contact [phone] Monday through Friday 8am- 5pm  After hours call [phone]

## 2016-04-15 ENCOUNTER — Encounter: Payer: Self-pay | Admitting: Adult Health

## 2016-04-15 ENCOUNTER — Non-Acute Institutional Stay (SKILLED_NURSING_FACILITY): Payer: Medicaid Other | Admitting: Adult Health

## 2016-04-15 DIAGNOSIS — F03B18 Unspecified dementia, moderate, with other behavioral disturbance: Secondary | ICD-10-CM

## 2016-04-15 DIAGNOSIS — I110 Hypertensive heart disease with heart failure: Secondary | ICD-10-CM

## 2016-04-15 DIAGNOSIS — I25118 Atherosclerotic heart disease of native coronary artery with other forms of angina pectoris: Secondary | ICD-10-CM | POA: Diagnosis not present

## 2016-04-15 DIAGNOSIS — G894 Chronic pain syndrome: Secondary | ICD-10-CM | POA: Diagnosis not present

## 2016-04-15 DIAGNOSIS — I5032 Chronic diastolic (congestive) heart failure: Secondary | ICD-10-CM | POA: Diagnosis not present

## 2016-04-15 DIAGNOSIS — K219 Gastro-esophageal reflux disease without esophagitis: Secondary | ICD-10-CM | POA: Diagnosis not present

## 2016-04-15 DIAGNOSIS — I639 Cerebral infarction, unspecified: Secondary | ICD-10-CM | POA: Diagnosis not present

## 2016-04-15 DIAGNOSIS — F0391 Unspecified dementia with behavioral disturbance: Secondary | ICD-10-CM

## 2016-04-15 DIAGNOSIS — J449 Chronic obstructive pulmonary disease, unspecified: Secondary | ICD-10-CM | POA: Diagnosis not present

## 2016-04-15 NOTE — Progress Notes (Signed)
Patient ID: [name], female   DOB: 12-26-1955, 60 y.o.   MRN: [id number]   Location:   [facility] Room Number: 208-A Place of Service:  SNF (31)   CODE STATUS: Full Code  Allergies  Allergen Reactions  . Penicillins Itching and Nausea And Vomiting    Has patient had a PCN reaction causing immediate rash, facial/tongue/throat swelling, SOB or lightheadedness with hypotension: No Has patient had a PCN reaction causing severe rash involving mucus membranes or skin necrosis: No Has patient had a PCN reaction that required hospitalization: No Has patient had a PCN reaction occurring within the last 10 years: No      Chief Complaint  Patient presents with  . Medical Management of Chronic Issues    Follow up    HPI:  She is a long term resident of this facility being seen for the management of her chronic illnesses. Staff reports that she has a cough. The cough is nonproductive and is  mostly at night after she goes to bed. There are no reports of fever present. She is unable to fully participate in the hpi or ros.   Past Medical History:  Diagnosis Date  . Accelerated hypertension   . Arthritis   . Asthma   . Bilateral chronic knee pain   . Chronic bronchitis (HCC)   . Coronary artery disease    a. remote hx of cath ~2000. B. cath  moderate disease in an RPL branch - too distal for PCI; 3rd RPLB lesion, 50% stenosed; hyperdynamic LV with near cavity obliteration & severely elevated LVEDP  . Dementia   . Diastolic CHF (HCC)   . Diverticular disease of large intestine    diverticular abscess in sigmoid region by CT 2009  . Heart attack   . Heart murmur   . Hypertension   . Non-ischemic cardiomyopathy (HCC)    cxr - mild cardiomegly, Echo 10/07/14 - severe LVH with LVEF 65-70%.  . Stroke (HCC)   . Wheelchair bound     Past Surgical History:  Procedure Laterality Date  . ABDOMINAL HYSTERECTOMY    . AMPUTATION  08/21/2011   Procedure: AMPUTATION DIGIT;   Surgeon: Sharma Covert, MD;  Location: [facility] OR;  Service: Orthopedics;  Laterality: Right;  . CARDIAC CATHETERIZATION N/A 10/09/2014   Procedure: Left Heart Cath and Coronary Angiography;  Surgeon: Marykay Lex, MD;  Location: [facility] INVASIVE CV LAB CUPID;  Service: Cardiovascular;  Laterality: N/A;  . CARDIAC CATHETERIZATION  10/2014   normal coronary arteries aside for a distal RPLB branch with 50% stenosis  . CORONARY STENT PLACEMENT    . FLEXIBLE SIGMOIDOSCOPY  04/28/2012   Procedure: FLEXIBLE SIGMOIDOSCOPY;  Surgeon: Petra Kuba, MD;  Location: [facility];  Service: Endoscopy;  Laterality: N/A;  unsedated, unprepped  . FLEXIBLE SIGMOIDOSCOPY N/A 12/10/2015   Procedure: FLEXIBLE SIGMOIDOSCOPY;  Surgeon: Bernette Redbird, MD;  Location: [facility];  Service: Endoscopy;  Laterality: N/A;  . I&D EXTREMITY  08/18/2011   Procedure: IRRIGATION AND DEBRIDEMENT EXTREMITY;  Surgeon: Sharma Covert, MD;  Location: [facility] OR;  Service: Orthopedics;  Laterality: Right;  I&D Right Long Finger    Social History   Social History  . Marital status: Legally Separated    Spouse name: N/A  . Number of children: N/A  . Years of education: N/A   Occupational History  . Not on file.   Social History Main Topics  . Smoking status: Current Every Day Smoker    Packs/day:  0.50    Years: 20.00    Types: Cigarettes  . Smokeless tobacco: Former Neurosurgeon    Types: Snuff  . Alcohol use 3.6 oz/week    6 Cans of beer per week     Comment: 04/27/12- "amount varies"  . Drug use: No  . Sexual activity: No   Other Topics Concern  . Not on file   Social History Narrative   ** Merged History Encounter **       Family History  Problem Relation Age of Onset  . Diabetes type I Mother   . Hypertension Mother   . Arthritis Mother   . Stroke Sister   . Diabetes type I Brother   . Hypertension Brother   . Healthy Brother   . Diabetes type II Brother   . Heart disease Brother   . Healthy Brother   . Healthy Brother    . Diabetes type I Sister   . Hypertension Sister   . Asthma Sister   . Bronchitis Sister       VITAL SIGNS BP (!) 143/80   Pulse (!) 58   Temp 98.7 F (37.1 C) (Oral)   Resp 20   Ht 4\' 9"  (1.448 m)   Wt 272 lb 3 oz (123.5 kg)   BMI 58.90 kg/m   Patient's Medications  New Prescriptions   No medications on file  Previous Medications   ALBUTEROL (PROVENTIL HFA;VENTOLIN HFA) 108 (90 BASE) MCG/ACT INHALER    1 to 2 puffs every 4 to 6 hours as needed   AMLODIPINE (NORVASC) 10 MG TABLET    Take 1 tablet (10 mg total) by mouth daily.   ASPIRIN EC 81 MG EC TABLET    Take 1 tablet (81 mg total) by mouth daily.   ATORVASTATIN (LIPITOR) 20 MG TABLET    Take 1 tablet (20 mg total) by mouth daily at 6 PM.   BACLOFEN (LIORESAL) 10 MG TABLET    Take 10 mg by mouth 3 (three) times daily as needed for muscle spasms.    DIVALPROEX (DEPAKOTE SPRINKLE) 125 MG CAPSULE    Take 125 mg by mouth 2 (two) times daily.   DONEPEZIL (ARICEPT) 10 MG TABLET    Take 10 mg by mouth at bedtime.   HYDRALAZINE (APRESOLINE) 50 MG TABLET    Take 1 tablet (50 mg total) by mouth every 8 (eight) hours.   HYDROCODONE-ACETAMINOPHEN (NORCO/VICODIN) 5-325 MG TABLET    Take 1 tablet by mouth every 6 (six) hours as needed for moderate pain.   HYDROXYZINE (VISTARIL) 25 MG CAPSULE    Take 25 mg by mouth at bedtime.   METOPROLOL SUCCINATE (TOPROL-XL) 100 MG 24 HR TABLET    Take 1 tablet (100 mg total) by mouth daily.   RISPERIDONE (RISPERDAL) 0.5 MG TABLET    Take 0.5 mg by mouth at bedtime.   SERTRALINE (ZOLOFT) 25 MG TABLET    Take 37.5 mg by mouth daily.   TRAZODONE (DESYREL) 50 MG TABLET    Take 50 mg by mouth at bedtime.  Modified Medications   No medications on file  Discontinued Medications   No medications on file     SIGNIFICANT DIAGNOSTIC EXAMS  03-13-15: chest x-ray: cardiomegaly with patchy right lung infiltrates   02-22-16: chest x-ray: modest cardiomegaly with clear lungs; no TB seen   LABS REVIEWED:     12-12-15: wbc 7.3; hgb 12.6; hct 39.8; mcv 78.7 plt 183; glucose 88; bun 18; creat 1.00; k+ 4.1; na++139 02-09-16:  wbc 10.1; hgb 12.3; hct 41.1; mcv 79.0; plt 202; glucose 106; bun 18.6;creat 0.64; k+ 4.3; na++ 1143; liver normal albumin 4.3; tsh 1.32; hgb a1c 6.3; chol 165; ldl 70; trig 158; hdl 63    Review of Systems  Unable to perform ROS: Other      Physical Exam Vitals reviewed. Constitutional: No distress.  Obese   Eyes: Conjunctivae are normal.  Neck: Neck supple. No JVD present. No thyromegaly present.  Cardiovascular: Normal rate, regular rhythm and intact distal pulses.   Respiratory: Effort normal. No respiratory distress.  Diminished breath sounds  Is 02 dependent   GI: Soft. Bowel sounds are normal. She exhibits no distension. There is no tenderness.  Musculoskeletal: She exhibits no edema.  Able to move all extremities   Lymphadenopathy:    She has no cervical adenopathy.  Neurological:  Awake    Skin: Skin is warm and dry. She is not diaphoretic.     ASSESSMENT/ PLAN:  1. Diastolic heart failure:  EF 55-60% (02-20-15); will continue hydralazine 50 mg three times daily   2. Hypertension: will continue hydralazine 50 mg three times daily toprol xl 100 mg daily norvasc 10 mg daily  asa 81 mg daily   3. CAD: is status post stent; cath (10/2014): no complaint of chest pain present; will continue asa 81 mg daily   4. CVA: is nueroloigcally stable; will continue asa 81 mg daily will continue baclofen 10 mg three times daily as needed for spasticity has vicodin 5/325 mg every 6 hours as needed for pain   5. COPD: is 02 dependent; will continue albuterol 2 puffs every 6 hours as needed  6.  Dyslipidemia: will continue lipitor 20 mg daily ldl is 70  7. Dementia: no significant change in her status; will continue aricept 10 mg nightly    8. Depression with psychosis:  Is off the seroquel; will continue zoloft 37.5 mg daily and will continue trazodone 50 mg nightly    9. Chronic pain: will continue baclofen 10 mg three times daily as needed  and will continue vicodin 5/325 mg every 6 hours as needed   10. Gerd: her cough is more than likely related to gerd; will begin prilosec 20 mg daily and will monitor     MD is aware of resident's narcotic use and is in agreement with current plan of care. We will attempt to wean resident as appropriate.      Synthia Innocenteborah Lavonta Tillis NP [facility]  Contact [phone] Monday through Friday 8am- 5pm  After hours call [phone]

## 2016-05-07 ENCOUNTER — Telehealth: Payer: Self-pay | Admitting: Internal Medicine

## 2016-05-07 NOTE — Telephone Encounter (Signed)
lmtcb X1 for Fayrene Fearing at [facility] [employer].

## 2016-05-08 NOTE — Telephone Encounter (Signed)
Spoke with [name] at [facility] [employer] [facility], states pt needs an ONO to requalify for nocturnal 02.   Pt has not been seen since 01/2015, no pending appts with our clinic.  [name] has also requested that we call patient for office visit and then schedule her for ONO.  lmtcb X1 for patient.

## 2016-05-09 NOTE — Telephone Encounter (Signed)
Spoke with pt. States that she doesn't have oxygen and doesn't want to make an appointment. Nothing further was needed at this time.

## 2016-05-13 ENCOUNTER — Encounter: Payer: Self-pay | Admitting: Adult Health

## 2016-05-13 ENCOUNTER — Non-Acute Institutional Stay (SKILLED_NURSING_FACILITY): Payer: Medicaid Other | Admitting: Adult Health

## 2016-05-13 DIAGNOSIS — I639 Cerebral infarction, unspecified: Secondary | ICD-10-CM | POA: Diagnosis not present

## 2016-05-13 DIAGNOSIS — J9611 Chronic respiratory failure with hypoxia: Secondary | ICD-10-CM

## 2016-05-13 DIAGNOSIS — M150 Primary generalized (osteo)arthritis: Secondary | ICD-10-CM | POA: Diagnosis not present

## 2016-05-13 DIAGNOSIS — F03B18 Unspecified dementia, moderate, with other behavioral disturbance: Secondary | ICD-10-CM

## 2016-05-13 DIAGNOSIS — J449 Chronic obstructive pulmonary disease, unspecified: Secondary | ICD-10-CM | POA: Diagnosis not present

## 2016-05-13 DIAGNOSIS — I110 Hypertensive heart disease with heart failure: Secondary | ICD-10-CM | POA: Diagnosis not present

## 2016-05-13 DIAGNOSIS — F0391 Unspecified dementia with behavioral disturbance: Secondary | ICD-10-CM | POA: Diagnosis not present

## 2016-05-13 DIAGNOSIS — G894 Chronic pain syndrome: Secondary | ICD-10-CM

## 2016-05-13 DIAGNOSIS — M159 Polyosteoarthritis, unspecified: Secondary | ICD-10-CM

## 2016-05-13 DIAGNOSIS — I5032 Chronic diastolic (congestive) heart failure: Secondary | ICD-10-CM

## 2016-05-13 DIAGNOSIS — I25118 Atherosclerotic heart disease of native coronary artery with other forms of angina pectoris: Secondary | ICD-10-CM

## 2016-05-13 NOTE — Progress Notes (Signed)
Patient ID: [name], female   DOB: 1955/09/03, 60 y.o.   MRN: [id number]   Location:   [facility] Room Number: 208-A Place of Service:  SNF (31)   CODE STATUS: Full Code  Allergies  Allergen Reactions  . Penicillins Itching and Nausea And Vomiting    Has patient had a PCN reaction causing immediate rash, facial/tongue/throat swelling, SOB or lightheadedness with hypotension: No Has patient had a PCN reaction causing severe rash involving mucus membranes or skin necrosis: No Has patient had a PCN reaction that required hospitalization: No Has patient had a PCN reaction occurring within the last 10 years: No      Chief Complaint  Patient presents with  . Medical Management of Chronic Issues    Follow up    HPI:  She is a long term resident of this facility being seen for the management of her chronic illnesses. Overall her status is stable. She does get out of bed on most days. She is unable to fully participate in the hpi or ros; but did say she was ok. There are no nursing concerns at this time.    Past Medical History:  Diagnosis Date  . Accelerated hypertension   . Arthritis   . Asthma   . Bilateral chronic knee pain   . Chronic bronchitis (HCC)   . Coronary artery disease    a. remote hx of cath ~2000. B. cath  moderate disease in an RPL branch - too distal for PCI; 3rd RPLB lesion, 50% stenosed; hyperdynamic LV with near cavity obliteration & severely elevated LVEDP  . Dementia   . Diastolic CHF (HCC)   . Diverticular disease of large intestine    diverticular abscess in sigmoid region by CT 2009  . Heart attack   . Heart murmur   . Hypertension   . Non-ischemic cardiomyopathy (HCC)    cxr - mild cardiomegly, Echo 10/07/14 - severe LVH with LVEF 65-70%.  . Stroke (HCC)   . Wheelchair bound     Past Surgical History:  Procedure Laterality Date  . ABDOMINAL HYSTERECTOMY    . AMPUTATION  08/21/2011   Procedure: AMPUTATION DIGIT;  Surgeon:  Sharma Covert, MD;  Location: [facility] OR;  Service: Orthopedics;  Laterality: Right;  . CARDIAC CATHETERIZATION N/A 10/09/2014   Procedure: Left Heart Cath and Coronary Angiography;  Surgeon: Marykay Lex, MD;  Location: [facility] INVASIVE CV LAB CUPID;  Service: Cardiovascular;  Laterality: N/A;  . CARDIAC CATHETERIZATION  10/2014   normal coronary arteries aside for a distal RPLB branch with 50% stenosis  . CORONARY STENT PLACEMENT    . FLEXIBLE SIGMOIDOSCOPY  04/28/2012   Procedure: FLEXIBLE SIGMOIDOSCOPY;  Surgeon: Petra Kuba, MD;  Location: [facility] ENDOSCOPY;  Service: Endoscopy;  Laterality: N/A;  unsedated, unprepped  . FLEXIBLE SIGMOIDOSCOPY N/A 12/10/2015   Procedure: FLEXIBLE SIGMOIDOSCOPY;  Surgeon: Bernette Redbird, MD;  Location: [facility]COPY;  Service: Endoscopy;  Laterality: N/A;  . I&D EXTREMITY  08/18/2011   Procedure: IRRIGATION AND DEBRIDEMENT EXTREMITY;  Surgeon: Sharma Covert, MD;  Location: [facility] OR;  Service: Orthopedics;  Laterality: Right;  I&D Right Long Finger    Social History   Social History  . Marital status: Legally Separated    Spouse name: N/A  . Number of children: N/A  . Years of education: N/A   Occupational History  . Not on file.   Social History Main Topics  . Smoking status: Current Every Day Smoker    Packs/day:  0.50    Years: 20.00    Types: Cigarettes  . Smokeless tobacco: Former NeurosurgeonUser    Types: Snuff  . Alcohol use 3.6 oz/week    6 Cans of beer per week     Comment: 04/27/12- "amount varies"  . Drug use: No  . Sexual activity: No   Other Topics Concern  . Not on file   Social History Narrative   ** Merged History Encounter **       Family History  Problem Relation Age of Onset  . Diabetes type I Mother   . Hypertension Mother   . Arthritis Mother   . Stroke Sister   . Diabetes type I Brother   . Hypertension Brother   . Healthy Brother   . Diabetes type II Brother   . Heart disease Brother   . Healthy Brother   . Healthy Brother   .  Diabetes type I Sister   . Hypertension Sister   . Asthma Sister   . Bronchitis Sister       VITAL SIGNS BP (!) 129/56   Pulse 68   Temp 98.7 F (37.1 C) (Oral)   Resp 20   Ht 4\' 9"  (1.448 m)   Wt 275 lb 9 oz (125 kg)   BMI 59.63 kg/m   Patient's Medications  New Prescriptions   No medications on file  Previous Medications   ALBUTEROL (PROVENTIL HFA;VENTOLIN HFA) 108 (90 BASE) MCG/ACT INHALER    1 to 2 puffs every 4 to 6 hours as needed   AMLODIPINE (NORVASC) 10 MG TABLET    Take 1 tablet (10 mg total) by mouth daily.   ASPIRIN EC 81 MG EC TABLET    Take 1 tablet (81 mg total) by mouth daily.   ATORVASTATIN (LIPITOR) 20 MG TABLET    Take 1 tablet (20 mg total) by mouth daily at 6 PM.   BACLOFEN (LIORESAL) 10 MG TABLET    Take 10 mg by mouth 3 (three) times daily as needed for muscle spasms.    DIVALPROEX (DEPAKOTE SPRINKLE) 125 MG CAPSULE    Take 125 mg by mouth 2 (two) times daily.   DONEPEZIL (ARICEPT) 10 MG TABLET    Take 10 mg by mouth at bedtime.   HYDRALAZINE (APRESOLINE) 50 MG TABLET    Take 1 tablet (50 mg total) by mouth every 8 (eight) hours.   HYDROCODONE-ACETAMINOPHEN (NORCO/VICODIN) 5-325 MG TABLET    Take 1 tablet by mouth every 6 (six) hours as needed for moderate pain.   HYDROXYZINE (VISTARIL) 25 MG CAPSULE    Take 25 mg by mouth at bedtime.   METOPROLOL SUCCINATE (TOPROL-XL) 100 MG 24 HR TABLET    Take 1 tablet (100 mg total) by mouth daily.   OMEPRAZOLE (PRILOSEC) 20 MG CAPSULE    Take 20 mg by mouth daily.   RISPERIDONE (RISPERDAL) 0.5 MG TABLET    Take 0.5 mg by mouth at bedtime.   SERTRALINE (ZOLOFT) 25 MG TABLET    Take 37.5 mg by mouth daily.   TRAZODONE (DESYREL) 50 MG TABLET    Take 50 mg by mouth at bedtime.  Modified Medications   No medications on file  Discontinued Medications   No medications on file     SIGNIFICANT DIAGNOSTIC EXAMS  03-13-15: chest x-ray: cardiomegaly with patchy right lung infiltrates   02-22-16: chest x-ray: modest  cardiomegaly with clear lungs; no TB seen   LABS REVIEWED:   12-12-15: wbc 7.3; hgb 12.6; hct 39.8;  mcv 78.7 plt 183; glucose 88; bun 18; creat 1.00; k+ 4.1; na++139 02-09-16: wbc 10.1; hgb 12.3; hct 41.1; mcv 79.0; plt 202; glucose 106; bun 18.6;creat 0.64; k+ 4.3; na++ 1143; liver normal albumin 4.3; tsh 1.32; hgb a1c 6.3; chol 165; ldl 70; trig 158; hdl 63    Review of Systems  Unable to perform ROS: Other      Physical Exam Vitals reviewed. Constitutional: No distress.  Obese   Eyes: Conjunctivae are normal.  Neck: Neck supple. No JVD present. No thyromegaly present.  Cardiovascular: Normal rate, regular rhythm and intact distal pulses.   Respiratory: Effort normal. No respiratory distress.  Diminished breath sounds  Is 02 dependent   GI: Soft. Bowel sounds are normal. She exhibits no distension. There is no tenderness.  Musculoskeletal: She exhibits no edema.  Able to move all extremities   Lymphadenopathy:    She has no cervical adenopathy.  Neurological: alert    Skin: Skin is warm and dry. She is not diaphoretic.     ASSESSMENT/ PLAN:  1. Diastolic heart failure:  EF 55-60% (02-20-15); will continue hydralazine 50 mg three times daily   2. Hypertension: will continue hydralazine 50 mg three times daily toprol xl 100 mg daily norvasc 10 mg daily  asa 81 mg daily   3. CAD: is status post stent; cath (10/2014): no complaint of chest pain present; will continue asa 81 mg daily   4. CVA: is nueroloigcally stable; will continue asa 81 mg daily will continue baclofen 10 mg three times daily as needed for spasticity has vicodin 5/325 mg every 6 hours as needed for pain   5. COPD: is 02 dependent; will continue albuterol 2 puffs every 6 hours as needed  6.  Dyslipidemia: will continue lipitor 20 mg daily ldl is 70  7. Dementia: no significant change in her status; will continue aricept 10 mg nightly    8. Depression with psychosis:  Is off the seroquel; will continue  zoloft 37.5 mg daily and will continue trazodone 50 mg nightly   9. Chronic pain: will continue baclofen 10 mg three times daily as needed  and will continue vicodin 5/325 mg every 6 hours as needed   10. Gerd: will continue  prilosec 20 mg daily and will monitor     MD is aware of resident's narcotic use and is in agreement with current plan of care. We will attempt to wean resident as appropriate.     Synthia Innocent NP [facility]  Contact [phone] Monday through Friday 8am- 5pm  After hours call [phone]

## 2016-06-01 DIAGNOSIS — J449 Chronic obstructive pulmonary disease, unspecified: Secondary | ICD-10-CM | POA: Insufficient documentation

## 2016-06-03 ENCOUNTER — Ambulatory Visit: Payer: Medicaid Other | Admitting: Neurology

## 2016-06-13 ENCOUNTER — Encounter: Payer: Self-pay | Admitting: Neurology

## 2016-06-18 ENCOUNTER — Non-Acute Institutional Stay (SKILLED_NURSING_FACILITY): Payer: Medicaid Other | Admitting: Internal Medicine

## 2016-06-18 ENCOUNTER — Encounter: Payer: Self-pay | Admitting: Internal Medicine

## 2016-06-18 DIAGNOSIS — F0391 Unspecified dementia with behavioral disturbance: Secondary | ICD-10-CM

## 2016-06-18 DIAGNOSIS — I5032 Chronic diastolic (congestive) heart failure: Secondary | ICD-10-CM | POA: Diagnosis not present

## 2016-06-18 DIAGNOSIS — I639 Cerebral infarction, unspecified: Secondary | ICD-10-CM | POA: Diagnosis not present

## 2016-06-18 DIAGNOSIS — G894 Chronic pain syndrome: Secondary | ICD-10-CM | POA: Diagnosis not present

## 2016-06-18 DIAGNOSIS — F03B18 Unspecified dementia, moderate, with other behavioral disturbance: Secondary | ICD-10-CM

## 2016-06-18 NOTE — Progress Notes (Signed)
This is a routine visit.  Level care skilled.  [facility] Advice worker of chronic medical issues including diastolic CHF-history CVA-coronary artery disease-hypertension-COPD-dementia-depression-chronic pain.  History of present illness.  Patient is a 61 year old female with the above diagnoses she appears to be relatively stable she does complaining of some left leg pain.  Apparently this is somewhat chronic-she continues on baclofen 10 mg 3 times a day as well as Vicodin and 5-3 25 mg every 6 hours when necessary she does have a history of CVA--continues with lower extremity weakness and immobility which I feel may be contributing to her pain--she says the pain is with movement  Her other medical issues appear to be relatively stable her weight is stable she does have a history of diastolic CHF and is on hydralazine 3 times a day.  In regards to dementia with behaviors per staff this is relatively stable she is on Risperdal daily at bedtime as well as Zoloft 37.5 mg a day with coexistent depression she is also on Depakote 125 mg twice a day Past Medical History:  Diagnosis Date  . Accelerated hypertension   . Arthritis   . Asthma   . Bilateral chronic knee pain   . Chronic bronchitis (HCC)   . Coronary artery disease    a. remote hx of cath ~2000. B. cath  moderate disease in an RPL branch - too distal for PCI; 3rd RPLB lesion, 50% stenosed; hyperdynamic LV with near cavity obliteration & severely elevated LVEDP  . Dementia   . Diastolic CHF (HCC)   . Diverticular disease of large intestine    diverticular abscess in sigmoid region by CT 2009  . Heart attack   . Heart murmur   . Hypertension   . Non-ischemic cardiomyopathy (HCC)    cxr - mild cardiomegly, Echo 10/07/14 - severe LVH with LVEF 65-70%.  . Stroke (HCC)   . Wheelchair bound          Past Surgical History:  Procedure Laterality Date  .  ABDOMINAL HYSTERECTOMY    . AMPUTATION  08/21/2011   Procedure: AMPUTATION DIGIT;  Surgeon: Sharma Covert, MD;  Location: [facility] OR;  Service: Orthopedics;  Laterality: Right;  . CARDIAC CATHETERIZATION N/A 10/09/2014   Procedure: Left Heart Cath and Coronary Angiography;  Surgeon: Marykay Lex, MD;  Location: [facility] INVASIVE CV LAB CUPID;  Service: Cardiovascular;  Laterality: N/A;  . CARDIAC CATHETERIZATION  10/2014   normal coronary arteries aside for a distal RPLB branch with 50% stenosis  . CORONARY STENT PLACEMENT    . FLEXIBLE SIGMOIDOSCOPY  04/28/2012   Procedure: FLEXIBLE SIGMOIDOSCOPY;  Surgeon: Petra Kuba, MD;  Location: [facility];  Service: Endoscopy;  Laterality: N/A;  unsedated, unprepped  . FLEXIBLE SIGMOIDOSCOPY N/A 12/10/2015   Procedure: FLEXIBLE SIGMOIDOSCOPY;  Surgeon: Bernette Redbird, MD;  Location: [facility];  Service: Endoscopy;  Laterality: N/A;  . I&D EXTREMITY  08/18/2011   Procedure: IRRIGATION AND DEBRIDEMENT EXTREMITY;  Surgeon: Sharma Covert, MD;  Location: [facility] OR;  Service: Orthopedics;  Laterality: Right;  I&D Right Long Finger    Social History        Social History  . Marital status: Legally Separated    Spouse name: N/A  . Number of children: N/A  . Years of education: N/A      Occupational History  . Not on file.         Social History Main Topics  . Smoking status:  Current Every Day Smoker    Packs/day: 0.50    Years: 20.00    Types: Cigarettes  . Smokeless tobacco: Former Neurosurgeon    Types: Snuff  . Alcohol use 3.6 oz/week    6 Cans of beer per week     Comment: 04/27/12- "amount varies"  . Drug use: No  . Sexual activity: No       Other Topics Concern  . Not on file      Social History Narrative   ** Merged History Encounter **            Family History  Problem Relation Age of Onset  . Diabetes type I Mother   . Hypertension Mother   . Arthritis Mother   . Stroke Sister   . Diabetes  type I Brother   . Hypertension Brother   . Healthy Brother   . Diabetes type II Brother   . Heart disease Brother   . Healthy Brother   . Healthy Brother   . Diabetes type I Sister   . Hypertension Sister   . Asthma Sister   . Bronchitis Sister       VITAL SIGNS BP (!) 129/56   Pulse 68   Temp 98.7 F (37.1 C) (Oral)   Resp 20   Ht 4\' 9"  (1.448 m)   Wt 275 lb 9 oz (125 kg)   BMI 59.63 kg/m       Patient's Medications  New Prescriptions   No medications on file  Previous Medications   ALBUTEROL (PROVENTIL HFA;VENTOLIN HFA) 108 (90 BASE) MCG/ACT INHALER    1 to 2 puffs every 4 to 6 hours as needed   AMLODIPINE (NORVASC) 10 MG TABLET    Take 1 tablet (10 mg total) by mouth daily.   ASPIRIN EC 81 MG EC TABLET    Take 1 tablet (81 mg total) by mouth daily.   ATORVASTATIN (LIPITOR) 20 MG TABLET    Take 1 tablet (20 mg total) by mouth daily at 6 PM.   BACLOFEN (LIORESAL) 10 MG TABLET    Take 10 mg by mouth 3 (three) times daily as needed for muscle spasms.    DIVALPROEX (DEPAKOTE SPRINKLE) 125 MG CAPSULE    Take 125 mg by mouth 2 (two) times daily.   DONEPEZIL (ARICEPT) 10 MG TABLET    Take 10 mg by mouth at bedtime.   HYDRALAZINE (APRESOLINE) 50 MG TABLET    Take 1 tablet (50 mg total) by mouth every 8 (eight) hours.   HYDROCODONE-ACETAMINOPHEN (NORCO/VICODIN) 5-325 MG TABLET    Take 1 tablet by mouth every 6 (six) hours as needed for moderate pain.   HYDROXYZINE (VISTARIL) 25 MG CAPSULE    Take 25 mg by mouth at bedtime.   METOPROLOL SUCCINATE (TOPROL-XL) 100 MG 24 HR TABLET    Take 1 tablet (100 mg total) by mouth daily.   OMEPRAZOLE (PRILOSEC) 20 MG CAPSULE    Take 20 mg by mouth daily.   RISPERIDONE (RISPERDAL) 0.5 MG TABLET    Take 0.5 mg by mouth at bedtime.   SERTRALINE (ZOLOFT) 25 MG TABLET    Take 37.5 mg by mouth daily.   TRAZODONE (DESYREL) 50 MG TABLET    Take 50 mg by mouth at bedtime.  Modified Medications   No medications on  file  Discontinued Medications   No medications on file     SIGNIFICANT DIAGNOSTIC EXAMS  03-13-15: chest x-ray: cardiomegaly with patchy right lung infiltrates  02-22-16: chest x-ray: modest cardiomegaly with clear lungs; no TB seen   LABS REVIEWED:   12-12-15: wbc 7.3; hgb 12.6; hct 39.8; mcv 78.7 plt 183; glucose 88; bun 18; creat 1.00; k+ 4.1; na++139 02-09-16: wbc 10.1; hgb 12.3; hct 41.1; mcv 79.0; plt 202; glucose 106; bun 18.6;creat 0.64; k+ 4.3; na++ 1143; liver normal albumin 4.3; tsh 1.32; hgb a1c 6.3; chol 165; ldl 70; trig 158; hdl 63    Review of Systems  This is quite limited secondary to dementia please see history of present illness again her main complaint is some left leg discomfort intermittently     Physical Exam Vitals reviewed. Temperature 98.7 pulse 66 respirations 20 blood pressure 148/68 weight is stable at 276 Constitutional: No distress. Lying comfortably in bed  Obese   female Eyes: Conjunctivae are normal.  Neck: Neck supple. No JVD present. No thyromegaly present.  Cardiovascular: Normal rate, regular rhythm and intact distal pulses.   heart sounds are somewhat distant Respiratory: Effort normal. No respiratory distress.  Diminished breath sounds   GI: Soft. Bowel sounds are normal. She exhibits no distension. There is no tenderness. Abdomen is obese  Musculoskeletal: She exhibits no edema.  Able to move all extremities   with some stiffness weakness of lower extremities which appears to be chronic-there is no tenderness to palpation of the left leg-appears to have some mild discomfort with flexionand extension this appears to be more secondary to the stiffness and immobility   .  Neurological: alert    Skin: Skin is warm and dry. She is not diaphoretic.     ASSESSMENT/ PLAN:  1. Diastolic heart failure:  EF 55-60% (02-20-15); will continue hydralazine 50 mg three times daily   2. Hypertension: will continue hydralazine 50  mg three times daily toprol xl 100 mg daily norvasc 10 mg daily  asa 81 mg daily --at times has systolics above 140s but this will have to be monitored for any consistency  3. CAD: is status post stent; cath (10/2014): no complaint of chest pain present; will continue asa 81 mg daily   4. CVA: is nueroloigcally stable; will continue asa 81 mg daily will continue baclofen 10 mg three times daily as needed for spasticity has vicodin 5/325 mg every 6 hours as needed for pain   5. COPD: is 02 dependent; will continue albuterol 2 puffs every 6 hours as needed  6.  Dyslipidemia: will continue lipitor 20 mg daily ldl is 70 on lab done September 2017  7. Dementia: no significant change in her status; will continue aricept 10 mg nightly    8. Depression with psychosis:  Is off the seroquel; will continue zoloft 37.5 mg daily and will continue trazodone 50 mg nightly is also on Depakote 125 mg twice a day  9. Chronic pain: will continue baclofen 10 mg three times daily as needed  Secondary to left lower extremity pain. I suspect secondary to immobility Will discontinue the Vicodin and start routine Tylenol 650 mg twice a day-also will add OxyIR every 4 hours as needed for pain will make this 5 mg-appears a when necessary Vicodin is not very effective  10. Genella Rife: will continue  prilosec 20 mg daily and will monitor   Of note Will update a CBC secondary to her history of anticoagulation on aspirin-also will update a metabolic panel with her history of hypertension.  [id number]

## 2016-06-19 ENCOUNTER — Other Ambulatory Visit: Payer: Self-pay

## 2016-06-19 MED ORDER — OXYCODONE HCL 5 MG PO CAPS: 5.0000 mg | ORAL_CAPSULE | ORAL | 0 refills | Status: DC | PRN

## 2016-06-19 NOTE — Telephone Encounter (Signed)
Prescription request was received from:  [facility]  [address] [place] [address]  PHONE: [phone]   Fax: [phone] 

## 2016-07-09 ENCOUNTER — Encounter: Payer: Self-pay | Admitting: Internal Medicine

## 2016-07-09 NOTE — Progress Notes (Signed)
This is a routine visit.  Level of care skilled.  [facility] Advice worker of chronic medical conditions including diastolic CHF-hypertension-coronary artery disease-history CVA-COPD-dementia-depression-chronic pain.  History of present illness.  Patient is a 61 year old female with the above diagnoses-she does not have any complaints today nursing staff has not brought any concerns to me either.  She is unable to fully per to see the history of present illness or review of systems secondary to dementia.  She is denying any shortness of breath pain abdominal discomfort today says she has a so-so appetite.   She does have a history of chronic pain but is not really complaining of pain today she is on Vicodin 5-3 25 mg every 6 hours when necessary she is also on baclofen 10 mg 3 times a day-she says when her legs are palpated sometimes it hurts but otherwise does not really complain.   Past Medical History:  Diagnosis Date  . Accelerated hypertension   . Arthritis   . Asthma   . Bilateral chronic knee pain   . Chronic bronchitis (HCC)   . Coronary artery disease    a. remote hx of cath ~2000. B. cath moderate disease in an RPL branch - too distal for PCI; 3rd RPLB lesion, 50% stenosed; hyperdynamic LV with near cavity obliteration &severely elevated LVEDP  . Dementia   . Diastolic CHF (HCC)   . Diverticular disease of large intestine    diverticular abscess in sigmoid region by CT 2009  . Heart attack   . Heart murmur   . Hypertension   . Non-ischemic cardiomyopathy (HCC)    cxr - mild cardiomegly, Echo 10/07/14 - severe LVH with LVEF 65-70%.  . Stroke (HCC)   . Wheelchair bound          Past Surgical History:  Procedure Laterality Date  . ABDOMINAL HYSTERECTOMY    . AMPUTATION  08/21/2011   Procedure: AMPUTATION DIGIT; Surgeon: Sharma Covert, MD; Location: [facility] OR; Service: Orthopedics;  Laterality: Right;  . CARDIAC CATHETERIZATION N/A 10/09/2014   Procedure: Left Heart Cath and Coronary Angiography; Surgeon: Marykay Lex, MD; Location: [facility] INVASIVE CV LAB CUPID; Service: Cardiovascular; Laterality: N/A;  . CARDIAC CATHETERIZATION  10/2014   normal coronary arteries aside for a distal RPLB branch with 50% stenosis  . CORONARY STENT PLACEMENT    . FLEXIBLE SIGMOIDOSCOPY  04/28/2012   Procedure: FLEXIBLE SIGMOIDOSCOPY; Surgeon: Petra Kuba, MD; Location: [facility]; Service: Endoscopy; Laterality: N/A; unsedated, unprepped  . FLEXIBLE SIGMOIDOSCOPY N/A 12/10/2015   Procedure: FLEXIBLE SIGMOIDOSCOPY; Surgeon: Bernette Redbird, MD; Location: [facility]; Service: Endoscopy; Laterality: N/A;  . I&D EXTREMITY  08/18/2011   Procedure: IRRIGATION AND DEBRIDEMENT EXTREMITY; Surgeon: Sharma Covert, MD; Location: [facility] OR; Service: Orthopedics; Laterality: Right; I&D Right Long Finger    Social History        Social History  . Marital status: Legally Separated    Spouse name: N/A  . Number of children: N/A  . Years of education: N/A      Occupational History  . Not on file.         Social History Main Topics  . Smoking status: Current Every Day Smoker    Packs/day: 0.50    Years: 20.00    Types: Cigarettes  . Smokeless tobacco: Former Neurosurgeon    Types: Snuff  . Alcohol use 3.6 oz/week    6 Cans of beer per week     Comment: 04/27/12- "amount  varies"  . Drug use: No  . Sexual activity: No       Other Topics Concern  . Not on file      Social History Narrative   ** Merged History Encounter **           Family History  Problem Relation Age of Onset  . Diabetes type I Mother   . Hypertension Mother   . Arthritis Mother   . Stroke Sister   . Diabetes type I Brother   . Hypertension Brother   . Healthy Brother   . Diabetes type II Brother   . Heart disease Brother   . Healthy  Brother   . Healthy Brother   . Diabetes type I Sister   . Hypertension Sister   . Asthma Sister   . Bronchitis Sister       VITAL SIGNS      Patient's Medications  New Prescriptions   No medications on file  Previous Medications   ALBUTEROL (PROVENTIL HFA;VENTOLIN HFA) 108 (90 BASE) MCG/ACT INHALER 1 to 2 puffs every 4 to 6 hours as needed   AMLODIPINE (NORVASC) 10 MG TABLET Take 1 tablet (10 mg total) by mouth daily.   ASPIRIN EC 81 MG EC TABLET Take 1 tablet (81 mg total) by mouth daily.   ATORVASTATIN (LIPITOR) 20 MG TABLET Take 1 tablet (20 mg total) by mouth daily at 6 PM.   BACLOFEN (LIORESAL) 10 MG TABLET Take 10 mg by mouth 3 (three) times daily as needed for muscle spasms.    DIVALPROEX (DEPAKOTE SPRINKLE) 125 MG CAPSULE Take 125 mg by mouth 2 (two) times daily.   DONEPEZIL (ARICEPT) 10 MG TABLET Take 10 mg by mouth at bedtime.   HYDRALAZINE (APRESOLINE) 50 MG TABLET Take 1 tablet (50 mg total) by mouth every 8 (eight) hours.   HYDROCODONE-ACETAMINOPHEN (NORCO/VICODIN) 5-325 MG TABLET Take 1 tablet by mouth every 6 (six) hours as needed for moderate pain.   HYDROXYZINE (VISTARIL) 25 MG CAPSULE Take 25 mg by mouth at bedtime.   METOPROLOL SUCCINATE (TOPROL-XL) 100 MG 24 HR TABLET Take 1 tablet (100 mg total) by mouth daily.   OMEPRAZOLE (PRILOSEC) 20 MG CAPSULE Take 20 mg by mouth daily.   RISPERIDONE (RISPERDAL) 0.5 MG TABLET Take 0.5 mg by mouth at bedtime.   SERTRALINE (ZOLOFT) 25 MG TABLET Take 37.5 mg by mouth daily.   TRAZODONE (DESYREL) 50 MG TABLET Take 50 mg by mouth at bedtime.  Modified Medications   No medications on file  Discontinued Medications   No medications on file     SIGNIFICANT DIAGNOSTIC EXAMS  03-13-15: chest x-ray: cardiomegaly with patchy right lung infiltrates   02-22-16: chest x-ray: modest cardiomegaly with clear lungs; no TB seen   LABS REVIEWED:    12-12-15: wbc 7.3; hgb 12.6; hct 39.8; mcv 78.7 plt 183; glucose 88; bun 18; creat 1.00; k+ 4.1; na++139 02-09-16: wbc 10.1; hgb 12.3; hct 41.1; mcv 79.0; plt 202; glucose 106; bun 18.6;creat 0.64; k+ 4.3; na++ 1143; liver normal albumin 4.3; tsh 1.32; hgb a1c 6.3; chol 165; ldl 70; trig 158; hdl 63    Review of Systems  This is quite limited secondary to dementia please see history of present illness Does not really have any complaints today     Physical Exam  Constitutional: No distress. Lying comfortably in bed  Obese  female Eyes: Conjunctivae are normal she has prescription lenses visual acuity appears grossly at baseline.    Cardiovascular: Normal rate,  regular rhythm and intact distal pulses. Slightly bradycardic in the 50s   Respiratory: Effort normal. No respiratory distress.  Diminished breath sounds   GI: Soft. Bowel sounds are normal. She exhibits no distension. There is no tenderness. Abdomen is obese  Musculoskeletal: She exhibits no edema.  Able to move all extremities  with some stiffness weakness of lower extremities which appears to be chronic-there is some baseline tenderness to palpation of her lower extremities she has scant lower extremity edema .  Neurological: alert  Skin: Skin is warm and dry. She is not diaphoretic.     ASSESSMENT/ PLAN:  1. Diastolic heart failure: EF 55-60% (02-20-15); will continue hydralazine 50 mg three times daily   2. Hypertension: will continue hydralazine 50 mg three times daily toprol xl 100 mg daily norvasc 10 mg daily asa 81 mg daily --  3. CAD: is status post stent; cath (10/2014): no complaint of chest pain present; will continue asa 81 mg daily   4. CVA: is nueroloigcally stable; will continue asa 81 mg daily will continue baclofen 10 mg three times daily as needed for spasticity has vicodin 5/325 mg every 6 hours as needed for pain   5. COPD: is 02 dependent; will continue albuterol 2 puffs every 6  hours as needed  6. Dyslipidemia: will continue lipitor 20 mg daily ldl is 70 on lab done September 2017  7. Dementia: no significant change in her status; will continue aricept 10 mg nightly   8. Depression with psychosis: Is off the seroquel; will continue zoloft 37.5 mg daily and will continue trazodone 50 mg nightly is also on Depakote 125 mg twice a day  9. Chronic pain: will continue baclofen 10 mg three times daily as needed    10. Genella Rife: will continue prilosec 20 mg daily and will monitor   Of note Will update a CBC secondary to her history of anticoagulation on aspirin-also will update a metabolic panel with her history of hypertension.  [id number]        This encounter was created in error - please disregard.

## 2016-07-29 ENCOUNTER — Encounter: Payer: Self-pay | Admitting: Adult Health

## 2016-07-29 ENCOUNTER — Non-Acute Institutional Stay (SKILLED_NURSING_FACILITY): Payer: Medicaid Other | Admitting: Adult Health

## 2016-07-29 DIAGNOSIS — F0391 Unspecified dementia with behavioral disturbance: Secondary | ICD-10-CM | POA: Diagnosis not present

## 2016-07-29 DIAGNOSIS — K219 Gastro-esophageal reflux disease without esophagitis: Secondary | ICD-10-CM

## 2016-07-29 DIAGNOSIS — I5032 Chronic diastolic (congestive) heart failure: Secondary | ICD-10-CM

## 2016-07-29 DIAGNOSIS — I25118 Atherosclerotic heart disease of native coronary artery with other forms of angina pectoris: Secondary | ICD-10-CM | POA: Diagnosis not present

## 2016-07-29 DIAGNOSIS — F03B18 Unspecified dementia, moderate, with other behavioral disturbance: Secondary | ICD-10-CM

## 2016-07-29 DIAGNOSIS — G894 Chronic pain syndrome: Secondary | ICD-10-CM

## 2016-07-29 DIAGNOSIS — I639 Cerebral infarction, unspecified: Secondary | ICD-10-CM | POA: Diagnosis not present

## 2016-07-29 DIAGNOSIS — J449 Chronic obstructive pulmonary disease, unspecified: Secondary | ICD-10-CM | POA: Diagnosis not present

## 2016-07-29 DIAGNOSIS — E782 Mixed hyperlipidemia: Secondary | ICD-10-CM

## 2016-07-29 DIAGNOSIS — I110 Hypertensive heart disease with heart failure: Secondary | ICD-10-CM | POA: Diagnosis not present

## 2016-07-29 NOTE — Progress Notes (Signed)
Location:   [facility] Room Number: 208 A Place of Service:  SNF (31)   CODE STATUS: Full Code  Allergies  Allergen Reactions  . Penicillins Itching and Nausea And Vomiting    Has patient had a PCN reaction causing immediate rash, facial/tongue/throat swelling, SOB or lightheadedness with hypotension: No Has patient had a PCN reaction causing severe rash involving mucus membranes or skin necrosis: No Has patient had a PCN reaction that required hospitalization: No Has patient had a PCN reaction occurring within the last 10 years: No      Chief Complaint  Patient presents with  . Medical Management of Chronic Issues    routine follow up    HPI:  She is a long term resident of this facility being seen for the management of her chronic illnesses. Overall her status is without change.  She does spend nearly all of time in bed per her choice. There are no nursing concerns at this time.  She is unable to fully participate in the hpi or ros.   Past Medical History:  Diagnosis Date  . Accelerated hypertension   . Arthritis   . Asthma   . Bilateral chronic knee pain   . Chronic bronchitis (HCC)   . Coronary artery disease    a. remote hx of cath ~2000. B. cath  moderate disease in an RPL branch - too distal for PCI; 3rd RPLB lesion, 50% stenosed; hyperdynamic LV with near cavity obliteration & severely elevated LVEDP  . Dementia   . Diastolic CHF (HCC)   . Diverticular disease of large intestine    diverticular abscess in sigmoid region by CT 2009  . Heart attack   . Heart murmur   . Hypertension   . Non-ischemic cardiomyopathy (HCC)    cxr - mild cardiomegly, Echo 10/07/14 - severe LVH with LVEF 65-70%.  . Stroke (HCC)   . Wheelchair bound     Past Surgical History:  Procedure Laterality Date  . ABDOMINAL HYSTERECTOMY    . AMPUTATION  08/21/2011   Procedure: AMPUTATION DIGIT;  Surgeon: Sharma Covert, MD;  Location: [facility] OR;  Service: Orthopedics;  Laterality:  Right;  . CARDIAC CATHETERIZATION N/A 10/09/2014   Procedure: Left Heart Cath and Coronary Angiography;  Surgeon: Marykay Lex, MD;  Location: [facility] INVASIVE CV LAB CUPID;  Service: Cardiovascular;  Laterality: N/A;  . CARDIAC CATHETERIZATION  10/2014   normal coronary arteries aside for a distal RPLB branch with 50% stenosis  . CORONARY STENT PLACEMENT    . FLEXIBLE SIGMOIDOSCOPY  04/28/2012   Procedure: FLEXIBLE SIGMOIDOSCOPY;  Surgeon: Petra Kuba, MD;  Location: [facility];  Service: Endoscopy;  Laterality: N/A;  unsedated, unprepped  . FLEXIBLE SIGMOIDOSCOPY N/A 12/10/2015   Procedure: FLEXIBLE SIGMOIDOSCOPY;  Surgeon: Bernette Redbird, MD;  Location: [facility];  Service: Endoscopy;  Laterality: N/A;  . I&D EXTREMITY  08/18/2011   Procedure: IRRIGATION AND DEBRIDEMENT EXTREMITY;  Surgeon: Sharma Covert, MD;  Location: [facility] OR;  Service: Orthopedics;  Laterality: Right;  I&D Right Long Finger    Social History   Social History  . Marital status: Legally Separated    Spouse name: N/A  . Number of children: N/A  . Years of education: N/A   Occupational History  . Not on file.   Social History Main Topics  . Smoking status: Current Every Day Smoker    Packs/day: 0.50    Years: 20.00    Types: Cigarettes  . Smokeless tobacco: Former Neurosurgeon  Types: Snuff  . Alcohol use 3.6 oz/week    6 Cans of beer per week     Comment: 04/27/12- "amount varies"  . Drug use: No  . Sexual activity: No   Other Topics Concern  . Not on file   Social History Narrative   ** Merged History Encounter **       Family History  Problem Relation Age of Onset  . Diabetes type I Mother   . Hypertension Mother   . Arthritis Mother   . Stroke Sister   . Diabetes type I Brother   . Hypertension Brother   . Healthy Brother   . Diabetes type II Brother   . Heart disease Brother   . Healthy Brother   . Healthy Brother   . Diabetes type I Sister   . Hypertension Sister   . Asthma Sister   .  Bronchitis Sister       Vitals:   07/29/16 0958  BP: 136/70  Pulse: 66  Resp: 20  Temp: 98.7 F (37.1 C)  Weight: 275 lb (124.7 kg)  Height: 4\' 9"  (1.448 m)      Patient's Medications  New Prescriptions   No medications on file  Previous Medications   ACETAMINOPHEN (TYLENOL) 325 MG TABLET    Take 650 mg by mouth every 12 (twelve) hours.   ALBUTEROL (PROVENTIL HFA;VENTOLIN HFA) 108 (90 BASE) MCG/ACT INHALER    1 to 2 puffs every 4 to 6 hours as needed   AMLODIPINE (NORVASC) 10 MG TABLET    Take 1 tablet (10 mg total) by mouth daily.   ASPIRIN EC 81 MG EC TABLET    Take 1 tablet (81 mg total) by mouth daily.   ATORVASTATIN (LIPITOR) 20 MG TABLET    Take 1 tablet (20 mg total) by mouth daily at 6 PM.   BACLOFEN (LIORESAL) 10 MG TABLET    Take 10 mg by mouth 3 (three) times daily as needed for muscle spasms.    DIVALPROEX (DEPAKOTE SPRINKLE) 125 MG CAPSULE    Take 125 mg by mouth 2 (two) times daily.   DONEPEZIL (ARICEPT) 10 MG TABLET    Take 10 mg by mouth at bedtime.   HYDRALAZINE (APRESOLINE) 50 MG TABLET    Take 1 tablet (50 mg total) by mouth every 8 (eight) hours.   METOPROLOL SUCCINATE (TOPROL-XL) 100 MG 24 HR TABLET    Take 1 tablet (100 mg total) by mouth daily.   OMEPRAZOLE (PRILOSEC) 20 MG CAPSULE    Take 20 mg by mouth daily.   OXYCODONE (OXY-IR) 5 MG CAPSULE    Take 1 capsule (5 mg total) by mouth every 4 (four) hours as needed for pain.   RISPERIDONE (RISPERDAL) 0.5 MG TABLET    Take 0.5 mg by mouth at bedtime.   SERTRALINE (ZOLOFT) 25 MG TABLET    Take by mouth daily. Give 1.5 tablet by mouth one time a day   TRAZODONE (DESYREL) 50 MG TABLET    Take 50 mg by mouth at bedtime.  Modified Medications   No medications on file  Discontinued Medications   HYDROCODONE-ACETAMINOPHEN (NORCO/VICODIN) 5-325 MG TABLET    Take 1 tablet by mouth every 6 (six) hours as needed for moderate pain.   HYDROXYZINE (VISTARIL) 25 MG CAPSULE    Take 25 mg by mouth at bedtime.    SERTRALINE (ZOLOFT) 25 MG TABLET    Take 37.5 mg by mouth daily.     SIGNIFICANT DIAGNOSTIC EXAMS  03-13-15: chest x-ray: cardiomegaly with patchy right lung infiltrates   02-22-16: chest x-ray: modest cardiomegaly with clear lungs; no TB seen   LABS REVIEWED:   12-12-15: wbc 7.3; hgb 12.6; hct 39.8; mcv 78.7 plt 183; glucose 88; bun 18; creat 1.00; k+ 4.1; na++139 02-09-16: wbc 10.1; hgb 12.3; hct 41.1; mcv 79.0; plt 202; glucose 106; bun 18.6;creat 0.64; k+ 4.3; na++ 1143; liver normal albumin 4.3; tsh 1.32; hgb a1c 6.3; chol 165; ldl 70; trig 158; hdl 63    Review of Systems  Unable to perform ROS: Other      Physical Exam Vitals reviewed. Constitutional: No distress.  Obese   Eyes: Conjunctivae are normal.  Neck: Neck supple. No JVD present. No thyromegaly present.  Cardiovascular: Normal rate, regular rhythm and intact distal pulses.   Respiratory: Effort normal. No respiratory distress.  Diminished breath sounds  Is 02 dependent   GI: Soft. Bowel sounds are normal. She exhibits no distension. There is no tenderness.  Musculoskeletal: She exhibits no edema.  Able to move all extremities   Lymphadenopathy:    She has no cervical adenopathy.  Neurological: alert    Skin: Skin is warm and dry. She is not diaphoretic.     ASSESSMENT/ PLAN:  1. Diastolic heart failure:  EF 55-60% (02-20-15); will continue hydralazine 50 mg three times daily toprol xl 100 mg daily   2. Hypertension: will continue hydralazine 50 mg three times daily toprol xl 100 mg daily norvasc 10 mg daily  asa 81 mg daily   3. CAD: is status post stent; cath (10/2014): no complaint of chest pain present; will continue asa 81 mg daily   4. CVA: is nueroloigcally stable; will continue asa 81 mg daily will continue baclofen 10 mg three times daily as needed for spasticity   5. COPD: is 02 dependent; will continue albuterol 1- 2 puffs every 4 hours as needed  6.  Dyslipidemia: will continue lipitor 20 mg  daily ldl is 70  7. Dementia: does have morbid obesity  no significant change in her status; will continue aricept 10 mg nightly    8. Depression with psychosis:  Will continue risperdal 0.5 mg nightly  zoloft 37.5 mg daily and will continue trazodone 50 mg nightly   9. Chronic pain: will continue baclofen 10 mg three times daily as needed  Take tylenol 650 mg twice daily  Has oxycodone 5 mg every 4 hours as needed   10. Gerd: will continue  prilosec 20 mg daily and will monitor    MD is aware of resident's narcotic use and is in agreement with current plan of care. We will attempt to wean resident as apropriate   Synthia Innocent NP [facility]  Contact [phone] Monday through Friday 8am- 5pm  After hours call [phone]

## 2016-08-27 ENCOUNTER — Encounter: Payer: Self-pay | Admitting: Adult Health

## 2016-08-27 ENCOUNTER — Non-Acute Institutional Stay (SKILLED_NURSING_FACILITY): Payer: Medicaid Other | Admitting: Adult Health

## 2016-08-27 DIAGNOSIS — I5032 Chronic diastolic (congestive) heart failure: Secondary | ICD-10-CM | POA: Diagnosis not present

## 2016-08-27 DIAGNOSIS — G894 Chronic pain syndrome: Secondary | ICD-10-CM

## 2016-08-27 DIAGNOSIS — I25118 Atherosclerotic heart disease of native coronary artery with other forms of angina pectoris: Secondary | ICD-10-CM | POA: Diagnosis not present

## 2016-08-27 DIAGNOSIS — J449 Chronic obstructive pulmonary disease, unspecified: Secondary | ICD-10-CM

## 2016-08-27 DIAGNOSIS — E782 Mixed hyperlipidemia: Secondary | ICD-10-CM

## 2016-08-27 DIAGNOSIS — I639 Cerebral infarction, unspecified: Secondary | ICD-10-CM

## 2016-08-27 DIAGNOSIS — I110 Hypertensive heart disease with heart failure: Secondary | ICD-10-CM

## 2016-08-27 DIAGNOSIS — F03B18 Unspecified dementia, moderate, with other behavioral disturbance: Secondary | ICD-10-CM

## 2016-08-27 DIAGNOSIS — F0391 Unspecified dementia with behavioral disturbance: Secondary | ICD-10-CM | POA: Diagnosis not present

## 2016-08-27 NOTE — Progress Notes (Signed)
Location:   [facility] Room Number: 208 A Place of Service:  SNF (31)   CODE STATUS:  Full Code  Allergies  Allergen Reactions  . Penicillins Itching and Nausea And Vomiting    Has patient had a PCN reaction causing immediate rash, facial/tongue/throat swelling, SOB or lightheadedness with hypotension: No Has patient had a PCN reaction causing severe rash involving mucus membranes or skin necrosis: No Has patient had a PCN reaction that required hospitalization: No Has patient had a PCN reaction occurring within the last 10 years: No      Chief Complaint  Patient presents with  . Medical Management of Chronic Issues    1 month follow up    HPI:  She is a long term resident of this facility being seen for the management of her chronic illnesses. Overall there is little change in her status. She does spend all of her time in bed per her choice. There are no nursing concerns at this time.,   Past Medical History:  Diagnosis Date  . Accelerated hypertension   . Arthritis   . Asthma   . Bilateral chronic knee pain   . Chronic bronchitis (HCC)   . Coronary artery disease    a. remote hx of cath ~2000. B. cath  moderate disease in an RPL branch - too distal for PCI; 3rd RPLB lesion, 50% stenosed; hyperdynamic LV with near cavity obliteration & severely elevated LVEDP  . Dementia   . Diastolic CHF (HCC)   . Diverticular disease of large intestine    diverticular abscess in sigmoid region by CT 2009  . Heart attack   . Heart murmur   . Hypertension   . Non-ischemic cardiomyopathy (HCC)    cxr - mild cardiomegly, Echo 10/07/14 - severe LVH with LVEF 65-70%.  . Stroke (HCC)   . Wheelchair bound     Past Surgical History:  Procedure Laterality Date  . ABDOMINAL HYSTERECTOMY    . AMPUTATION  08/21/2011   Procedure: AMPUTATION DIGIT;  Surgeon: Sharma Covert, MD;  Location: [facility] OR;  Service: Orthopedics;  Laterality: Right;  . CARDIAC CATHETERIZATION N/A 10/09/2014     Procedure: Left Heart Cath and Coronary Angiography;  Surgeon: Marykay Lex, MD;  Location: [facility] INVASIVE CV LAB CUPID;  Service: Cardiovascular;  Laterality: N/A;  . CARDIAC CATHETERIZATION  10/2014   normal coronary arteries aside for a distal RPLB branch with 50% stenosis  . CORONARY STENT PLACEMENT    . FLEXIBLE SIGMOIDOSCOPY  04/28/2012   Procedure: FLEXIBLE SIGMOIDOSCOPY;  Surgeon: Petra Kuba, MD;  Location: [facility] ENDOSCOPY;  Service: Endoscopy;  Laterality: N/A;  unsedated, unprepped  . FLEXIBLE SIGMOIDOSCOPY N/A 12/10/2015   Procedure: FLEXIBLE SIGMOIDOSCOPY;  Surgeon: Bernette Redbird, MD;  Location: [facility] ENDOSCOPY;  Service: Endoscopy;  Laterality: N/A;  . I&D EXTREMITY  08/18/2011   Procedure: IRRIGATION AND DEBRIDEMENT EXTREMITY;  Surgeon: Sharma Covert, MD;  Location: [facility] OR;  Service: Orthopedics;  Laterality: Right;  I&D Right Long Finger    Social History   Social History  . Marital status: Legally Separated    Spouse name: N/A  . Number of children: N/A  . Years of education: N/A   Occupational History  . Not on file.   Social History Main Topics  . Smoking status: Current Every Day Smoker    Packs/day: 0.50    Years: 20.00    Types: Cigarettes  . Smokeless tobacco: Former Neurosurgeon    Types: Snuff  . Alcohol  use 3.6 oz/week    6 Cans of beer per week     Comment: 04/27/12- "amount varies"  . Drug use: No  . Sexual activity: No   Other Topics Concern  . Not on file   Social History Narrative   ** Merged History Encounter **       Family History  Problem Relation Age of Onset  . Diabetes type I Mother   . Hypertension Mother   . Arthritis Mother   . Stroke Sister   . Diabetes type I Brother   . Hypertension Brother   . Healthy Brother   . Diabetes type II Brother   . Heart disease Brother   . Healthy Brother   . Healthy Brother   . Diabetes type I Sister   . Hypertension Sister   . Asthma Sister   . Bronchitis Sister       VITAL SIGNS BP (!)  150/53   Pulse (!) 52   Temp 97.6 F (36.4 C)   Resp 20   Ht 4\' 9"  (1.448 m)   Wt 275 lb (124.7 kg)   SpO2 98%   BMI 59.51 kg/m   Patient's Medications  New Prescriptions   No medications on file  Previous Medications   ACETAMINOPHEN (TYLENOL) 325 MG TABLET    Take 650 mg by mouth every 12 (twelve) hours.   ALBUTEROL (PROVENTIL HFA;VENTOLIN HFA) 108 (90 BASE) MCG/ACT INHALER    Inhale 2 puffs into the lungs every 6 (six) hours as needed for wheezing or shortness of breath.   AMLODIPINE (NORVASC) 10 MG TABLET    Take 1 tablet (10 mg total) by mouth daily.   ASPIRIN EC 81 MG EC TABLET    Take 1 tablet (81 mg total) by mouth daily.   ATORVASTATIN (LIPITOR) 20 MG TABLET    Take 1 tablet (20 mg total) by mouth daily at 6 PM.   BACLOFEN (LIORESAL) 10 MG TABLET    Take 10 mg by mouth 3 (three) times daily as needed for muscle spasms.    DONEPEZIL (ARICEPT) 10 MG TABLET    Take 10 mg by mouth at bedtime.   HYDRALAZINE (APRESOLINE) 50 MG TABLET    Take 1 tablet (50 mg total) by mouth every 8 (eight) hours.   METOPROLOL SUCCINATE (TOPROL-XL) 100 MG 24 HR TABLET    Take 1 tablet (100 mg total) by mouth daily.   OMEPRAZOLE (PRILOSEC) 20 MG CAPSULE    Take 20 mg by mouth daily.   OXYCODONE (OXY-IR) 5 MG CAPSULE    Take 1 capsule (5 mg total) by mouth every 4 (four) hours as needed for pain.   RISPERIDONE (RISPERDAL) 0.5 MG TABLET    Take 0.5 mg by mouth at bedtime.   SERTRALINE (ZOLOFT) 25 MG TABLET    Take by mouth daily. Give 1.5 tablet by mouth one time a day   TRAZODONE (DESYREL) 50 MG TABLET    Take 50 mg by mouth at bedtime.  Modified Medications   No medications on file  Discontinued Medications   ALBUTEROL (PROVENTIL HFA;VENTOLIN HFA) 108 (90 BASE) MCG/ACT INHALER    1 to 2 puffs every 4 to 6 hours as needed   DIVALPROEX (DEPAKOTE SPRINKLE) 125 MG CAPSULE    Take 125 mg by mouth 2 (two) times daily.     SIGNIFICANT DIAGNOSTIC EXAMS  03-13-15: chest x-ray: cardiomegaly with patchy  right lung infiltrates   02-22-16: chest x-ray: modest cardiomegaly with clear lungs; no TB  seen   LABS REVIEWED:   12-12-15: wbc 7.3; hgb 12.6; hct 39.8; mcv 78.7 plt 183; glucose 88; bun 18; creat 1.00; k+ 4.1; na++139 02-09-16: wbc 10.1; hgb 12.3; hct 41.1; mcv 79.0; plt 202; glucose 106; bun 18.6;creat 0.64; k+ 4.3; na++ 1143; liver normal albumin 4.3; tsh 1.32; hgb a1c 6.3; chol 165; ldl 70; trig 158; hdl 63    Review of Systems  Unable to perform ROS: Other      Physical Exam Vitals reviewed. Constitutional: No distress.  Obese   Eyes: Conjunctivae are normal.  Neck: Neck supple. No JVD present. No thyromegaly present.  Cardiovascular: Normal rate, regular rhythm and intact distal pulses.   Respiratory: Effort normal. No respiratory distress.  Diminished breath sounds  GI: Soft. Bowel sounds are normal. She exhibits no distension. There is no tenderness.  Musculoskeletal: She exhibits no edema.  Able to move all extremities   Lymphadenopathy:    She has no cervical adenopathy.  Neurological: alert    Skin: Skin is warm and dry. She is not diaphoretic.     ASSESSMENT/ PLAN:  1. Diastolic heart failure:  EF 55-60% (02-20-15); will continue hydralazine 50 mg three times daily toprol xl 100 mg daily   2. Hypertension: will continue hydralazine 50 mg three times daily toprol xl 100 mg daily norvasc 10 mg daily  asa 81 mg daily  Will beign lisinopril 5 mg daily will have staff monitor blood pressure twice daily and will check renal function   3. CAD: is status post stent; cath (10/2014): no complaint of chest pain present; will continue asa 81 mg daily   4. CVA: is nueroloigcally stable; will continue asa 81 mg daily will continue baclofen 10 mg three times daily as needed for spasticity   5. COPD: is 02 dependent; will continue albuterol 1- 2 puffs every 4 hours as needed  6.  Dyslipidemia: will continue lipitor 20 mg daily ldl is 70  7. Dementia: does have morbid obesity   no significant change in her status; will continue aricept 10 mg nightly    8. Depression with psychosis:  Will continue risperdal 0.5 mg nightly  zoloft 37.5 mg daily and will continue trazodone 50 mg nightly   9. Chronic pain: will continue baclofen 10 mg three times daily as needed  Take tylenol 650 mg twice daily  Has oxycodone 5 mg every 4 hours as needed   10. Genella Rife: will continue  prilosec 20 mg daily and will monitor   In one week will check cbc; cmp; lipids; tsh     MD is aware of resident's narcotic use and is in agreement with current plan of care. We will attempt to wean resident as apropriate   Synthia Innocent NP [facility]  Contact [phone] Monday through Friday 8am- 5pm  After hours call [phone]

## 2016-09-02 ENCOUNTER — Encounter: Payer: Self-pay | Admitting: Adult Health

## 2016-09-02 ENCOUNTER — Non-Acute Institutional Stay (SKILLED_NURSING_FACILITY): Payer: Medicaid Other | Admitting: Adult Health

## 2016-09-02 DIAGNOSIS — M170 Bilateral primary osteoarthritis of knee: Secondary | ICD-10-CM | POA: Diagnosis not present

## 2016-09-02 NOTE — Progress Notes (Signed)
Location:   [facility] Room Number: 208 A Place of Service:  SNF (31)   CODE STATUS: Full Code  Allergies  Allergen Reactions  . Penicillins Itching and Nausea And Vomiting    Chief Complaint  Patient presents with  . Acute Visit    Knee pain    HPI:  She is complaining of bilateral knee pain; for which she is not getting any relief. She is unable to fully participate in the hpi or ros; but tells me that her knees hurt all the time.   Past Medical History:  Diagnosis Date  . Accelerated hypertension   . Arthritis   . Asthma   . Bilateral chronic knee pain   . Chronic bronchitis (HCC)   . Coronary artery disease    a. remote hx of cath ~2000. B. cath  moderate disease in an RPL branch - too distal for PCI; 3rd RPLB lesion, 50% stenosed; hyperdynamic LV with near cavity obliteration & severely elevated LVEDP  . Dementia   . Diastolic CHF (HCC)   . Diverticular disease of large intestine    diverticular abscess in sigmoid region by CT 2009  . Heart attack   . Heart murmur   . Hypertension   . Non-ischemic cardiomyopathy (HCC)    cxr - mild cardiomegly, Echo 10/07/14 - severe LVH with LVEF 65-70%.  . Stroke (HCC)   . Wheelchair bound     Past Surgical History:  Procedure Laterality Date  . ABDOMINAL HYSTERECTOMY    . AMPUTATION  08/21/2011   Procedure: AMPUTATION DIGIT;  Surgeon: Sharma Covert, MD;  Location: [facility] OR;  Service: Orthopedics;  Laterality: Right;  . CARDIAC CATHETERIZATION N/A 10/09/2014   Procedure: Left Heart Cath and Coronary Angiography;  Surgeon: Marykay Lex, MD;  Location: [facility] INVASIVE CV LAB CUPID;  Service: Cardiovascular;  Laterality: N/A;  . CARDIAC CATHETERIZATION  10/2014   normal coronary arteries aside for a distal RPLB branch with 50% stenosis  . CORONARY STENT PLACEMENT    . FLEXIBLE SIGMOIDOSCOPY  04/28/2012   Procedure: FLEXIBLE SIGMOIDOSCOPY;  Surgeon: Petra Kuba, MD;  Location: [facility];  Service: Endoscopy;   Laterality: N/A;  unsedated, unprepped  . FLEXIBLE SIGMOIDOSCOPY N/A 12/10/2015   Procedure: FLEXIBLE SIGMOIDOSCOPY;  Surgeon: Bernette Redbird, MD;  Location: [facility]COPY;  Service: Endoscopy;  Laterality: N/A;  . I&D EXTREMITY  08/18/2011   Procedure: IRRIGATION AND DEBRIDEMENT EXTREMITY;  Surgeon: Sharma Covert, MD;  Location: [facility] OR;  Service: Orthopedics;  Laterality: Right;  I&D Right Long Finger    Social History   Social History  . Marital status: Legally Separated    Spouse name: N/A  . Number of children: N/A  . Years of education: N/A   Occupational History  . Not on file.   Social History Main Topics  . Smoking status: Current Every Day Smoker    Packs/day: 0.50    Years: 20.00    Types: Cigarettes  . Smokeless tobacco: Former Neurosurgeon    Types: Snuff  . Alcohol use 3.6 oz/week    6 Cans of beer per week     Comment: 04/27/12- "amount varies"  . Drug use: No  . Sexual activity: No   Other Topics Concern  . Not on file   Social History Narrative   ** Merged History Encounter **       Family History  Problem Relation Age of Onset  . Diabetes type I Mother   . Hypertension Mother   .  Arthritis Mother   . Stroke Sister   . Diabetes type I Brother   . Hypertension Brother   . Healthy Brother   . Diabetes type II Brother   . Heart disease Brother   . Healthy Brother   . Healthy Brother   . Diabetes type I Sister   . Hypertension Sister   . Asthma Sister   . Bronchitis Sister       VITAL SIGNS BP (!) 144/78   Pulse (!) 52   Temp 97.6 F (36.4 C)   Resp 20   Ht 4\' 9"  (1.448 m)   Wt 275 lb (124.7 kg)   SpO2 98%   BMI 59.51 kg/m   Patient's Medications  New Prescriptions   No medications on file  Previous Medications   ACETAMINOPHEN (TYLENOL) 325 MG TABLET    Take 650 mg by mouth every 12 (twelve) hours.   ALBUTEROL (PROVENTIL HFA;VENTOLIN HFA) 108 (90 BASE) MCG/ACT INHALER    Inhale 2 puffs into the lungs every 6 (six) hours as needed for  wheezing or shortness of breath.   AMLODIPINE (NORVASC) 10 MG TABLET    Take 1 tablet (10 mg total) by mouth daily.   ASPIRIN EC 81 MG EC TABLET    Take 1 tablet (81 mg total) by mouth daily.   ATORVASTATIN (LIPITOR) 20 MG TABLET    Take 1 tablet (20 mg total) by mouth daily at 6 PM.   BACLOFEN (LIORESAL) 10 MG TABLET    Take 10 mg by mouth 3 (three) times daily as needed for muscle spasms.    DONEPEZIL (ARICEPT) 10 MG TABLET    Take 10 mg by mouth at bedtime.   HYDRALAZINE (APRESOLINE) 50 MG TABLET    Take 1 tablet (50 mg total) by mouth every 8 (eight) hours.   LISINOPRIL (PRINIVIL,ZESTRIL) 5 MG TABLET    Take 5 mg by mouth daily.   METOPROLOL SUCCINATE (TOPROL-XL) 100 MG 24 HR TABLET    Take 1 tablet (100 mg total) by mouth daily.   OMEPRAZOLE (PRILOSEC) 20 MG CAPSULE    Take 20 mg by mouth daily.   OXYCODONE (OXY-IR) 5 MG CAPSULE    Take 1 capsule (5 mg total) by mouth every 4 (four) hours as needed for pain.   RISPERIDONE (RISPERDAL) 0.5 MG TABLET    Take 0.5 mg by mouth at bedtime.   SERTRALINE (ZOLOFT) 25 MG TABLET    Take by mouth daily. Give 1.5 tablet by mouth one time a day   TRAZODONE (DESYREL) 50 MG TABLET    Take 50 mg by mouth at bedtime.  Modified Medications   No medications on file  Discontinued Medications   No medications on file     SIGNIFICANT DIAGNOSTIC EXAMS  03-13-15: chest x-ray: cardiomegaly with patchy right lung infiltrates   02-22-16: chest x-ray: modest cardiomegaly with clear lungs; no TB seen   LABS REVIEWED:   12-12-15: wbc 7.3; hgb 12.6; hct 39.8; mcv 78.7 plt 183; glucose 88; bun 18; creat 1.00; k+ 4.1; na++139 02-09-16: wbc 10.1; hgb 12.3; hct 41.1; mcv 79.0; plt 202; glucose 106; bun 18.6;creat 0.64; k+ 4.3; na++ 1143; liver normal albumin 4.3; tsh 1.32; hgb a1c 6.3; chol 165; ldl 70; trig 158; hdl 63    Review of Systems  Unable to perform ROS: Other      Physical Exam Vitals reviewed. Constitutional: No distress.  Obese   Eyes:  Conjunctivae are normal.  Neck: Neck supple. No JVD present.  No thyromegaly present.  Cardiovascular: Normal rate, regular rhythm and intact distal pulses.   Respiratory: Effort normal. No respiratory distress.  Diminished breath sounds  GI: Soft. Bowel sounds are normal. She exhibits no distension. There is no tenderness.  Musculoskeletal: She exhibits no edema.  Able to move all extremities   Lymphadenopathy:    She has no cervical adenopathy.  Neurological: alert    Skin: Skin is warm and dry. She is not diaphoretic.     ASSESSMENT/ PLAN:  1. Osteoarthritis: will begin voltaren gel 2 gm to both knee four times daily and will monitor her status.    MD is aware of resident's narcotic use and is in agreement with current plan of care. We will attempt to wean resident as apropriate    Synthia Innocent NP [facility]  Contact [phone] Monday through Friday 8am- 5pm  After hours call [phone]

## 2016-09-03 LAB — BASIC METABOLIC PANEL
BUN: 14 mg/dL (ref 4–21)
Creatinine: 0.4 mg/dL — AB (ref 0.5–1.1)
Glucose: 82 mg/dL
Potassium: 4.9 mmol/L (ref 3.4–5.3)
SODIUM: 145 mmol/L (ref 137–147)

## 2016-09-03 LAB — HEPATIC FUNCTION PANEL
ALK PHOS: 81 U/L (ref 25–125)
ALT: 2 U/L — AB (ref 7–35)
AST: 15 U/L (ref 13–35)
Bilirubin, Total: 0.2 mg/dL

## 2016-09-03 LAB — LIPID PANEL
Cholesterol: 169 mg/dL (ref 0–200)
HDL: 51 mg/dL (ref 35–70)
LDL CALC: 92 mg/dL
TRIGLYCERIDES: 129 mg/dL (ref 40–160)

## 2016-09-03 LAB — CBC AND DIFFERENTIAL
HEMATOCRIT: 40 % (ref 36–46)
Hemoglobin: 11.8 g/dL — AB (ref 12.0–16.0)
NEUTROS ABS: 5 /uL
PLATELETS: 219 10*3/uL (ref 150–399)
WBC: 8.7 10^3/mL

## 2016-09-03 LAB — TSH: TSH: 1.23 u[IU]/mL (ref 0.41–5.90)

## 2016-09-10 ENCOUNTER — Encounter: Payer: Self-pay | Admitting: Adult Health

## 2016-09-10 ENCOUNTER — Non-Acute Institutional Stay (SKILLED_NURSING_FACILITY): Payer: Medicaid Other | Admitting: Adult Health

## 2016-09-10 DIAGNOSIS — J449 Chronic obstructive pulmonary disease, unspecified: Secondary | ICD-10-CM | POA: Diagnosis not present

## 2016-09-10 DIAGNOSIS — F0391 Unspecified dementia with behavioral disturbance: Secondary | ICD-10-CM

## 2016-09-10 DIAGNOSIS — F03B18 Unspecified dementia, moderate, with other behavioral disturbance: Secondary | ICD-10-CM

## 2016-09-10 DIAGNOSIS — I110 Hypertensive heart disease with heart failure: Secondary | ICD-10-CM | POA: Diagnosis not present

## 2016-09-10 DIAGNOSIS — I5032 Chronic diastolic (congestive) heart failure: Secondary | ICD-10-CM

## 2016-09-10 NOTE — Progress Notes (Signed)
Location:   [facility] Room Number: 208 A Place of Service:  SNF (31)    CODE STATUS: Full Code  Allergies  Allergen Reactions  . Penicillins Itching and Nausea And Vomiting    Has patient had a PCN reaction causing immediate rash, facial/tongue/throat swelling, SOB or lightheadedness with hypotension: No Has patient had a PCN reaction causing severe rash involving mucus membranes or skin necrosis: No Has patient had a PCN reaction that required hospitalization: No Has patient had a PCN reaction occurring within the last 10 years: No      Chief Complaint  Patient presents with  . Discharge Note    Discharge    HPI:  She is being discharged to home with home health for rn. She will need a hoyer lift; hospital bed; standard wheelchair. She will need her prescriptions to be written and will need to follow up with her medical provider.    Past Medical History:  Diagnosis Date  . Accelerated hypertension   . Arthritis   . Asthma   . Bilateral chronic knee pain   . Chronic bronchitis (HCC)   . Coronary artery disease    a. remote hx of cath ~2000. B. cath  moderate disease in an RPL branch - too distal for PCI; 3rd RPLB lesion, 50% stenosed; hyperdynamic LV with near cavity obliteration & severely elevated LVEDP  . Dementia   . Diastolic CHF (HCC)   . Diverticular disease of large intestine    diverticular abscess in sigmoid region by CT 2009  . Heart attack   . Heart murmur   . Hypertension   . Non-ischemic cardiomyopathy (HCC)    cxr - mild cardiomegly, Echo 10/07/14 - severe LVH with LVEF 65-70%.  . Stroke (HCC)   . Wheelchair bound     Past Surgical History:  Procedure Laterality Date  . ABDOMINAL HYSTERECTOMY    . AMPUTATION  08/21/2011   Procedure: AMPUTATION DIGIT;  Surgeon: Sharma Covert, MD;  Location: [facility] OR;  Service: Orthopedics;  Laterality: Right;  . CARDIAC CATHETERIZATION N/A 10/09/2014   Procedure: Left Heart Cath and Coronary  Angiography;  Surgeon: Marykay Lex, MD;  Location: [facility] INVASIVE CV LAB CUPID;  Service: Cardiovascular;  Laterality: N/A;  . CARDIAC CATHETERIZATION  10/2014   normal coronary arteries aside for a distal RPLB branch with 50% stenosis  . CORONARY STENT PLACEMENT    . FLEXIBLE SIGMOIDOSCOPY  04/28/2012   Procedure: FLEXIBLE SIGMOIDOSCOPY;  Surgeon: Petra Kuba, MD;  Location: [facility]COPY;  Service: Endoscopy;  Laterality: N/A;  unsedated, unprepped  . FLEXIBLE SIGMOIDOSCOPY N/A 12/10/2015   Procedure: FLEXIBLE SIGMOIDOSCOPY;  Surgeon: Bernette Redbird, MD;  Location: [facility] ENDOSCOPY;  Service: Endoscopy;  Laterality: N/A;  . I&D EXTREMITY  08/18/2011   Procedure: IRRIGATION AND DEBRIDEMENT EXTREMITY;  Surgeon: Sharma Covert, MD;  Location: [facility] OR;  Service: Orthopedics;  Laterality: Right;  I&D Right Long Finger    Social History   Social History  . Marital status: Legally Separated    Spouse name: N/A  . Number of children: N/A  . Years of education: N/A   Occupational History  . Not on file.   Social History Main Topics  . Smoking status: Current Every Day Smoker    Packs/day: 0.50    Years: 20.00    Types: Cigarettes  . Smokeless tobacco: Former Neurosurgeon    Types: Snuff  . Alcohol use 3.6 oz/week    6 Cans of beer per week  Comment: 04/27/12- "amount varies"  . Drug use: No  . Sexual activity: No   Other Topics Concern  . Not on file   Social History Narrative   ** Merged History Encounter **       Family History  Problem Relation Age of Onset  . Diabetes type I Mother   . Hypertension Mother   . Arthritis Mother   . Stroke Sister   . Diabetes type I Brother   . Hypertension Brother   . Healthy Brother   . Diabetes type II Brother   . Heart disease Brother   . Healthy Brother   . Healthy Brother   . Diabetes type I Sister   . Hypertension Sister   . Asthma Sister   . Bronchitis Sister     VITAL SIGNS BP (!) 144/78   No other vital signs available    Patient's Medications  New Prescriptions   No medications on file  Previous Medications   ACETAMINOPHEN (TYLENOL) 325 MG TABLET    Take 650 mg by mouth every 12 (twelve) hours.   ALBUTEROL (PROVENTIL HFA;VENTOLIN HFA) 108 (90 BASE) MCG/ACT INHALER    Inhale 2 puffs into the lungs every 6 (six) hours as needed for wheezing or shortness of breath.   AMLODIPINE (NORVASC) 10 MG TABLET    Take 1 tablet (10 mg total) by mouth daily.   ASPIRIN EC 81 MG EC TABLET    Take 1 tablet (81 mg total) by mouth daily.   ATORVASTATIN (LIPITOR) 20 MG TABLET    Take 1 tablet (20 mg total) by mouth daily at 6 PM.   BACLOFEN (LIORESAL) 10 MG TABLET    Take 10 mg by mouth 3 (three) times daily as needed for muscle spasms.    DICLOFENAC SODIUM (VOLTAREN) 1 % GEL    Apply 2 g topically 4 (four) times daily.   DONEPEZIL (ARICEPT) 10 MG TABLET    Take 10 mg by mouth at bedtime.   HYDRALAZINE (APRESOLINE) 50 MG TABLET    Take 1 tablet (50 mg total) by mouth every 8 (eight) hours.   LISINOPRIL (PRINIVIL,ZESTRIL) 5 MG TABLET    Take 5 mg by mouth daily.   METOPROLOL SUCCINATE (TOPROL-XL) 100 MG 24 HR TABLET    Take 1 tablet (100 mg total) by mouth daily.   OMEPRAZOLE (PRILOSEC) 20 MG CAPSULE    Take 20 mg by mouth daily.   OXYCODONE (OXY-IR) 5 MG CAPSULE    Take 1 capsule (5 mg total) by mouth every 4 (four) hours as needed for pain.   RISPERIDONE (RISPERDAL) 0.5 MG TABLET    Take 0.5 mg by mouth at bedtime.   SERTRALINE (ZOLOFT) 25 MG TABLET    Take by mouth daily. Give 1.5 tablet by mouth one time a day   TRAZODONE (DESYREL) 50 MG TABLET    Take 50 mg by mouth at bedtime.  Modified Medications   No medications on file  Discontinued Medications   No medications on file     SIGNIFICANT DIAGNOSTIC EXAMS   03-13-15: chest x-ray: cardiomegaly with patchy right lung infiltrates   02-22-16: chest x-ray: modest cardiomegaly with clear lungs; no TB seen   LABS REVIEWED:   12-12-15: wbc 7.3; hgb 12.6; hct 39.8; mcv  78.7 plt 183; glucose 88; bun 18; creat 1.00; k+ 4.1; na++139 02-09-16: wbc 10.1; hgb 12.3; hct 41.1; mcv 79.0; plt 202; glucose 106; bun 18.6;creat 0.64; k+ 4.3; na++ 1143; liver normal albumin 4.3; tsh 1.32;  hgb a1c 6.3; chol 165; ldl 70; trig 158; hdl 63    Review of Systems  Unable to perform ROS: Other      Physical Exam Vitals reviewed. Constitutional: No distress.  Obese   Eyes: Conjunctivae are normal.  Neck: Neck supple. No JVD present. No thyromegaly present.  Cardiovascular: Normal rate, regular rhythm and intact distal pulses.   Respiratory: Effort normal. No respiratory distress.  Diminished breath sounds  GI: Soft. Bowel sounds are normal. She exhibits no distension. There is no tenderness.  Musculoskeletal: She exhibits no edema.  Able to move all extremities   Lymphadenopathy:    She has no cervical adenopathy.  Neurological: alert    Skin: Skin is warm and dry. She is not diaphoretic.     ASSESSMENT/ PLAN:   Patient is being discharged with the following home health services:  rn to evaluate and treat as indicated for medication management   Patient is being discharged with the following durable medical equipment:  Hoyer lift for transfers; standard wheel chair with cushion; elevated leg rests; anti-tippers; brake extensions to allow her to maintain her current level of independence with her adl's that cannot be achieved with a walker and can self propel Will need a semi-electric hospital bed due ot her chf in which she needs the head of her bed elevated >30 degrees which cannot be achieved in a standard bed   Patient has been advised to f/u with their PCP in 1-2 weeks to bring them up to date on their rehab stay.  Social services at facility was responsible for arranging this appointment.  Pt was provided with a 30 day supply of prescriptions for medications and refills must be obtained from their PCP.  For controlled substances, a more limited supply may be  provided adequate until PCP appointment only. #20  Oxycodone 5 mg tabs    Time spent with patient 45  minutes >50% time spent counseling; reviewing medical record; tests; labs; and developing future plan of care   Synthia Innocent NP [facility]  Contact [phone] Monday through Friday 8am- 5pm  After hours call [phone]

## 2017-03-25 IMAGING — CT CT ANGIO CHEST
2 of 4 series · 18 of 36 positions shown · IV contrast (OMNIPAQUE)
Comparison: None.

CLINICAL DATA: Shortness of breath, chest pain

EXAM:
CT ANGIOGRAPHY CHEST WITH CONTRAST
TECHNIQUE: Multidetector CT imaging of the chest was performed using the
standard protocol during bolus administration of intravenous
contrast. Multiplanar CT image reconstructions and MIPs were
obtained to evaluate the vascular anatomy.
CONTRAST:  100mL OMNIPAQUE IOHEXOL 350 MG/ML SOLN

[Series 6: pe xxl thins @1mm · axial · 0.65mm/px · z∈[-382,-148]mm · 15 of 261 slices shown]
[im 14/261  lung]
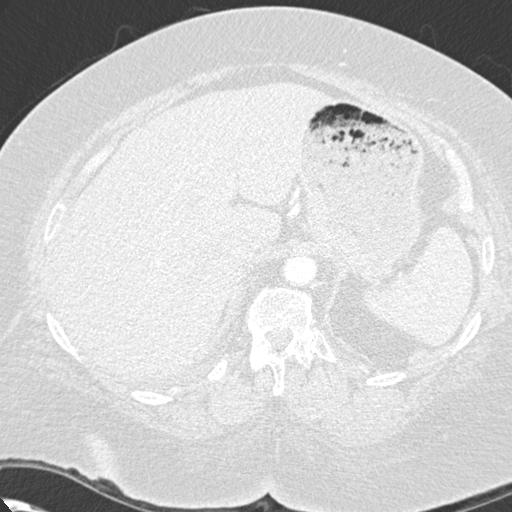
[im 27/261  mediastinal]
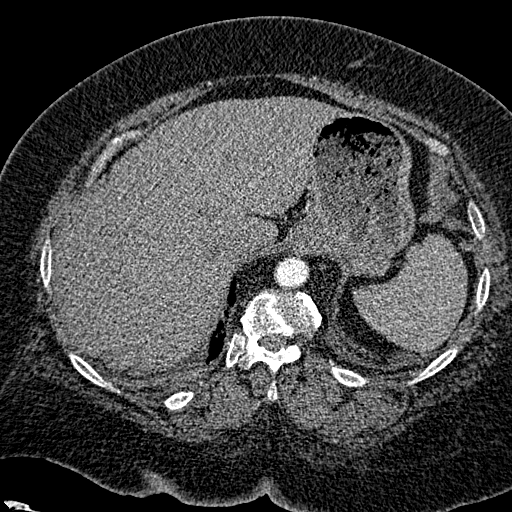
[im 53/261  lung]
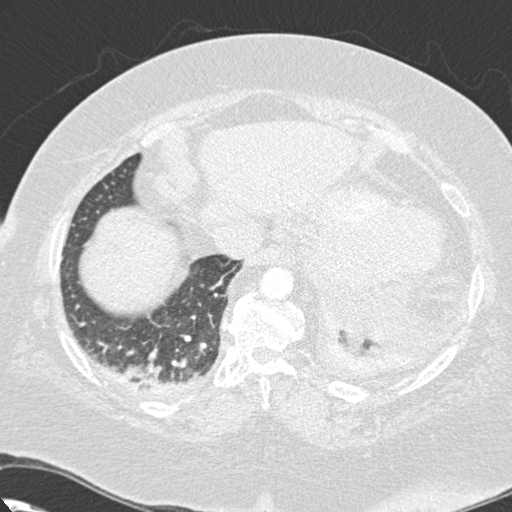
[im 66/261  mediastinal]
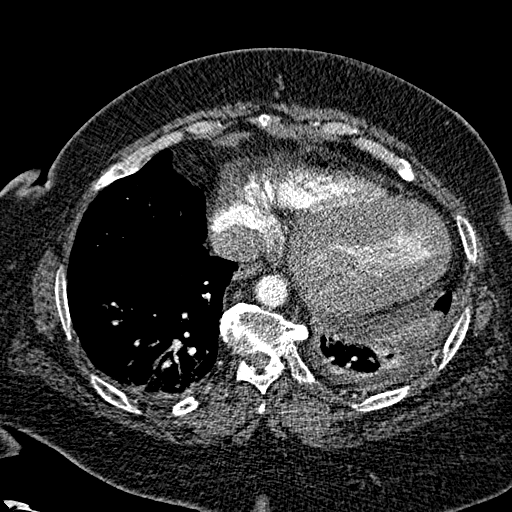
[im 79/261  lung]
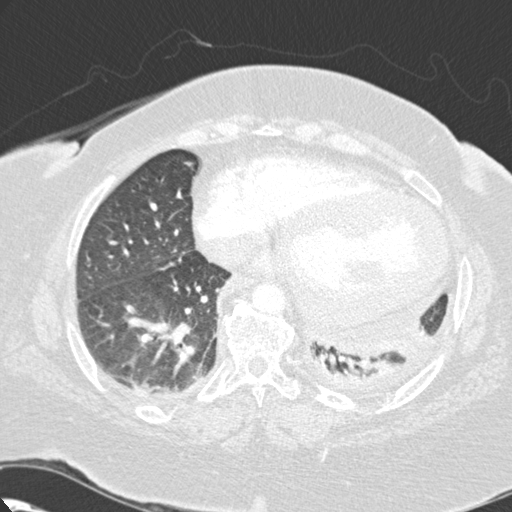
[im 92/261  mediastinal]
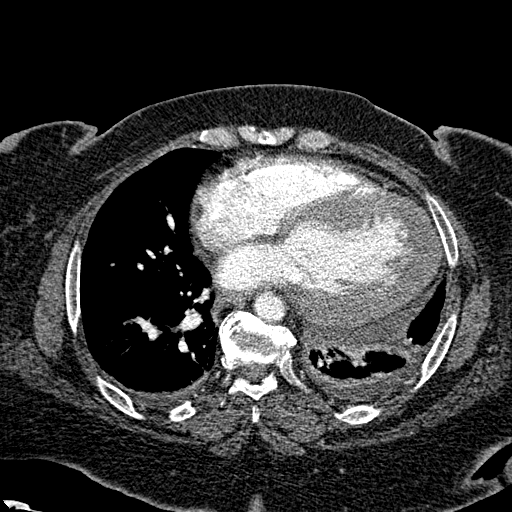
[im 118/261  lung]
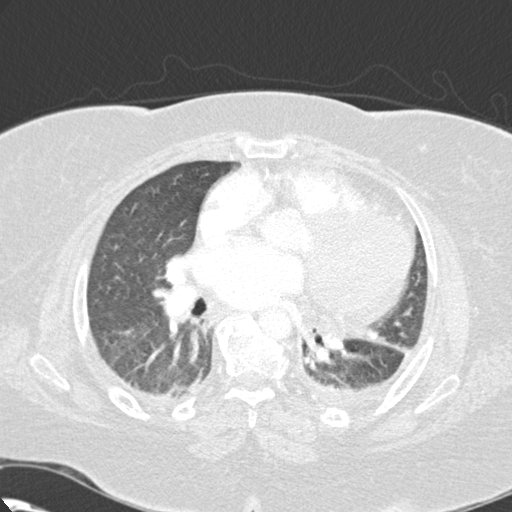
[im 131/261  mediastinal]
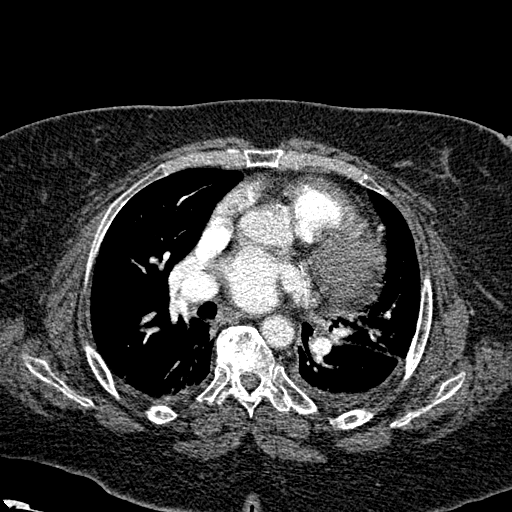
[im 144/261  lung]
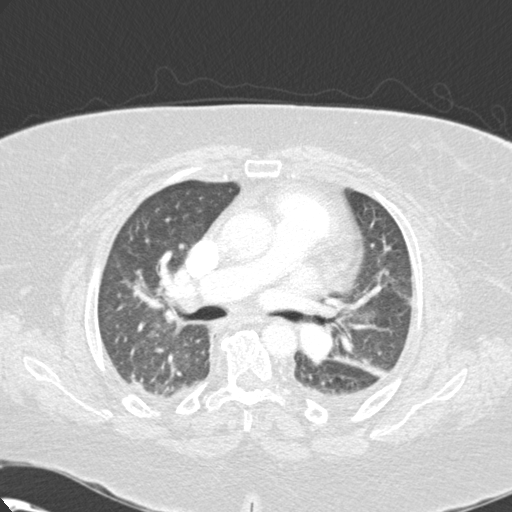
[im 170/261  mediastinal]
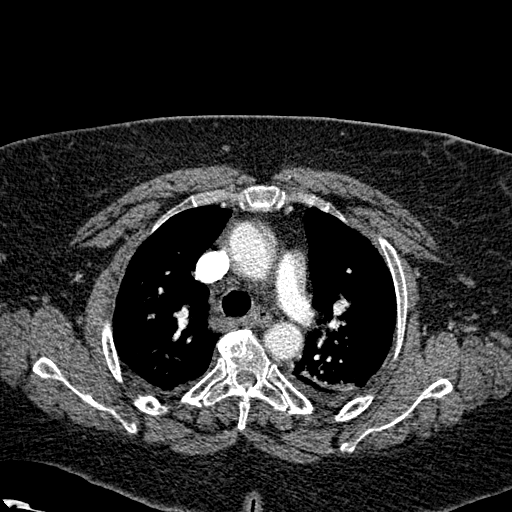
[im 183/261  lung]
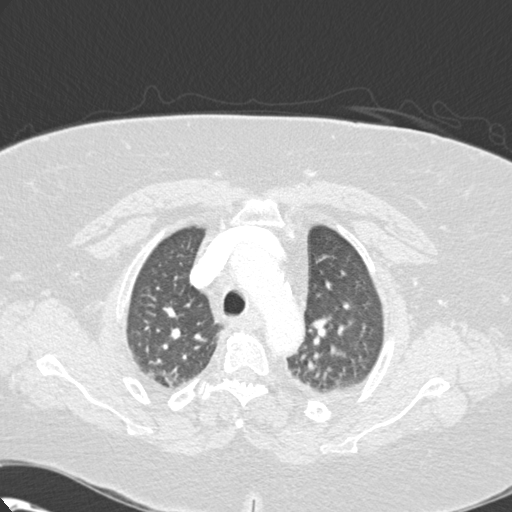
[im 196/261  mediastinal]
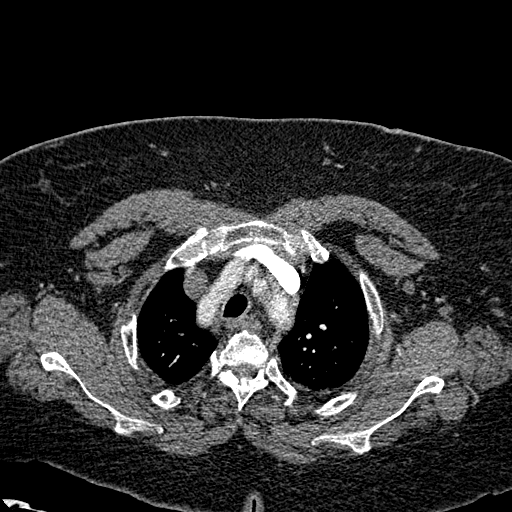
[im 209/261  lung]
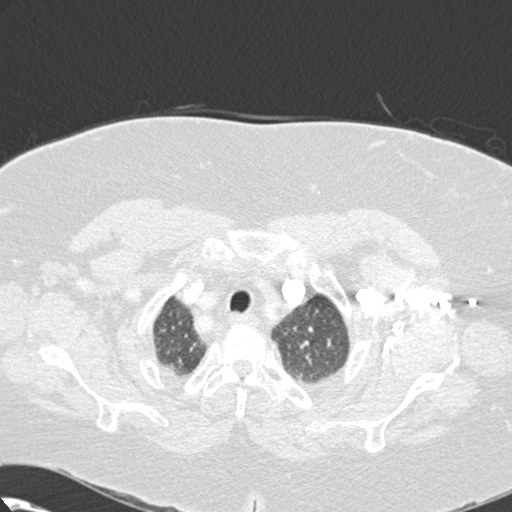
[im 235/261  mediastinal]
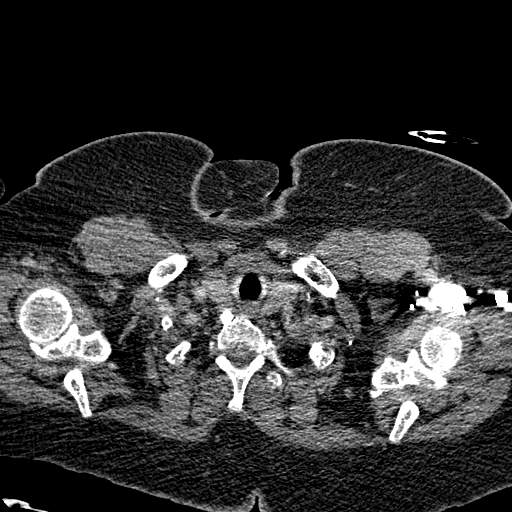
[im 248/261  lung]
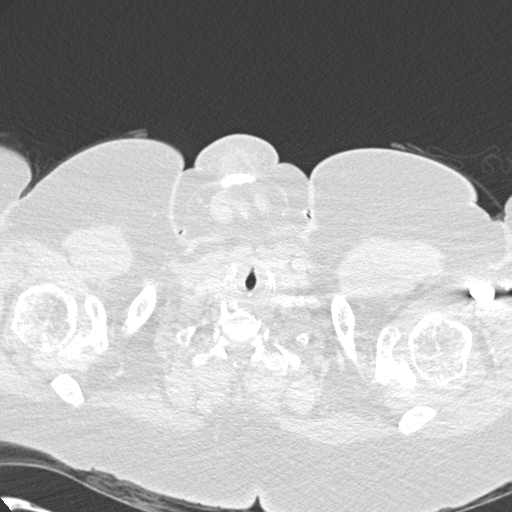

[Series 602: <mpr thick range> · coronal · 0.65mm/px · 3 of 75 slices shown]
[im 15/75  mediastinal]
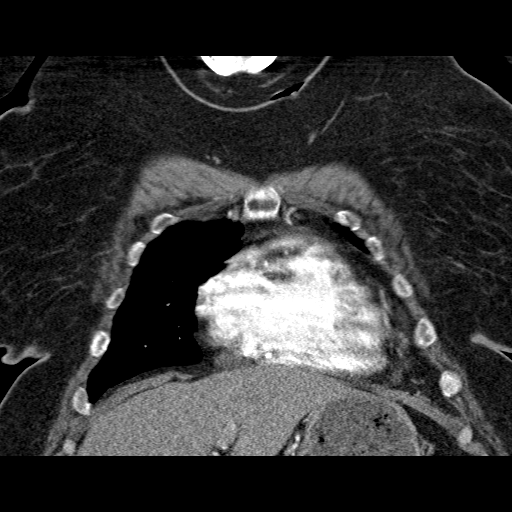
[im 30/75  mediastinal]
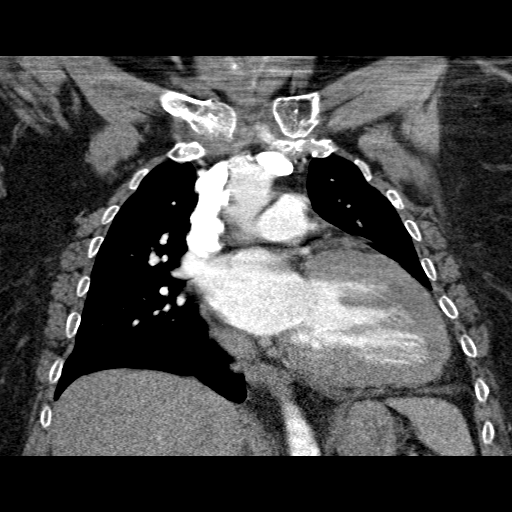
[im 45/75  mediastinal]
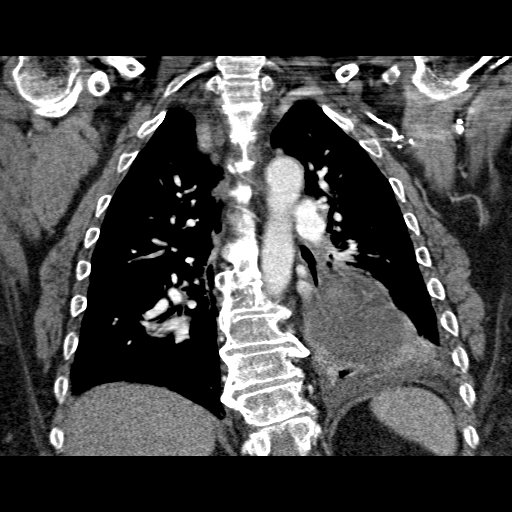

[18 of 36 positions shown; findings below may reference images not displayed]

FINDINGS: Left arm contrast injection. Innominate vein and SVC patent. The
right ventricle appears nondilated. Satisfactory opacification of
pulmonary arteries noted, and there is no evidence of pulmonary
emboli. Patent pulmonary veins bilaterally. Left ventricular
hypertrophy. Patchy coronary calcifications. Adequate contrast
opacification of the thoracic aorta with no evidence of dissection,
aneurysm, or stenosis. There is classic 3-vessel brachiocephalic
arch anatomy without proximal stenosis. Patchy calcifications in the
aortic arch.

Trace bilateral pleural effusions left greater than right and trace
pericardial effusion. No hilar or mediastinal adenopathy.
Atelectasis/consolidation posteriorly in the lower lobes left
greater than right. Poorly marginated patchy airspace opacity in the
right upper lobe centrally. Patient motion during the acquisition
degrades parenchymal evaluation. Flowing osteophytes across multiple
levels in the mid and lower thoracic spine. Sternum intact.
Visualized portions of upper abdomen unremarkable.

Review of the MIP images confirms the above findings.
IMPRESSION: 1. Negative for acute PE or thoracic aortic dissection.
2. Left ventricular hypertrophy.
3. Atherosclerosis, including aortic and coronary artery disease.
Please note that although the presence of coronary artery calcium
documents the presence of coronary artery disease, the severity of
this disease and any potential stenosis cannot be assessed on this
non-gated CT examination. Assessment for potential risk factor
modification, dietary therapy or pharmacologic therapy may be
warranted, if clinically indicated.
4. Small pleural and pericardial effusions.

## 2017-04-01 ENCOUNTER — Emergency Department (HOSPITAL_COMMUNITY)
Admission: EM | Admit: 2017-04-01 | Discharge: 2017-04-01 | Disposition: A | Discharge status: Home / Self Care | Payer: Medicaid Other | Attending: Emergency Medicine | Admitting: Emergency Medicine

## 2017-04-01 ENCOUNTER — Emergency Department (HOSPITAL_COMMUNITY): Payer: Medicaid Other

## 2017-04-01 ENCOUNTER — Encounter (HOSPITAL_COMMUNITY): Payer: Self-pay | Admitting: Emergency Medicine

## 2017-04-01 DIAGNOSIS — Z955 Presence of coronary angioplasty implant and graft: Secondary | ICD-10-CM | POA: Diagnosis not present

## 2017-04-01 DIAGNOSIS — Z8673 Personal history of transient ischemic attack (TIA), and cerebral infarction without residual deficits: Secondary | ICD-10-CM | POA: Diagnosis not present

## 2017-04-01 DIAGNOSIS — Z79899 Other long term (current) drug therapy: Secondary | ICD-10-CM | POA: Diagnosis not present

## 2017-04-01 DIAGNOSIS — I110 Hypertensive heart disease with heart failure: Secondary | ICD-10-CM | POA: Insufficient documentation

## 2017-04-01 DIAGNOSIS — J45909 Unspecified asthma, uncomplicated: Secondary | ICD-10-CM | POA: Diagnosis not present

## 2017-04-01 DIAGNOSIS — I2510 Atherosclerotic heart disease of native coronary artery without angina pectoris: Secondary | ICD-10-CM | POA: Diagnosis not present

## 2017-04-01 DIAGNOSIS — J449 Chronic obstructive pulmonary disease, unspecified: Secondary | ICD-10-CM | POA: Insufficient documentation

## 2017-04-01 DIAGNOSIS — R1110 Vomiting, unspecified: Secondary | ICD-10-CM | POA: Insufficient documentation

## 2017-04-01 DIAGNOSIS — I5032 Chronic diastolic (congestive) heart failure: Secondary | ICD-10-CM | POA: Diagnosis not present

## 2017-04-01 DIAGNOSIS — Z7982 Long term (current) use of aspirin: Secondary | ICD-10-CM | POA: Diagnosis not present

## 2017-04-01 DIAGNOSIS — R079 Chest pain, unspecified: Secondary | ICD-10-CM | POA: Diagnosis present

## 2017-04-01 DIAGNOSIS — F0390 Unspecified dementia without behavioral disturbance: Secondary | ICD-10-CM | POA: Insufficient documentation

## 2017-04-01 DIAGNOSIS — F17210 Nicotine dependence, cigarettes, uncomplicated: Secondary | ICD-10-CM | POA: Diagnosis not present

## 2017-04-01 LAB — BASIC METABOLIC PANEL
ANION GAP: 6 (ref 5–15)
BUN: 15 mg/dL (ref 6–20)
CALCIUM: 8.6 mg/dL — AB (ref 8.9–10.3)
CO2: 26 mmol/L (ref 22–32)
Chloride: 107 mmol/L (ref 101–111)
Creatinine, Ser: 0.64 mg/dL (ref 0.44–1.00)
GFR calc Af Amer: >60 mL/min (ref >60)
Glucose, Bld: 92 mg/dL (ref 65–99)
Potassium: 3.8 mmol/L (ref 3.5–5.1)
SODIUM: 139 mmol/L (ref 135–145)

## 2017-04-01 LAB — BRAIN NATRIURETIC PEPTIDE: B Natriuretic Peptide: 206.8 pg/mL — ABNORMAL HIGH (ref 0.0–100.0)

## 2017-04-01 LAB — I-STAT TROPONIN, ED
TROPONIN I, POC: 0.01 ng/mL (ref 0.00–0.08)
Troponin i, poc: 0.02 ng/mL (ref 0.00–0.08)

## 2017-04-01 LAB — CBC
HCT: 30.8 % — ABNORMAL LOW (ref 36.0–46.0)
HEMOGLOBIN: 9.5 g/dL — AB (ref 12.0–15.0)
MCH: 22.6 pg — AB (ref 26.0–34.0)
MCHC: 30.8 g/dL (ref 30.0–36.0)
MCV: 73.2 fL — ABNORMAL LOW (ref 78.0–100.0)
PLATELETS: 214 10*3/uL (ref 150–400)
RBC: 4.21 MIL/uL (ref 3.87–5.11)
RDW: 18.7 % — ABNORMAL HIGH (ref 11.5–15.5)
WBC: 9.2 10*3/uL (ref 4.0–10.5)

## 2017-04-01 MED ORDER — FUROSEMIDE 10 MG/ML IJ SOLN
40.0000 mg | Freq: Once | INTRAMUSCULAR | Status: AC
  Administered 2017-04-01: 40 mg via INTRAVENOUS
  Filled 2017-04-01: qty 4

## 2017-04-01 NOTE — Discharge Instructions (Signed)
Please read and follow all provided instructions.  Your diagnoses today include:  1. Chest pain, unspecified type     Tests performed today include: An EKG of your heart A chest x-ray Cardiac enzymes - a blood test for heart muscle damage Blood counts and electrolytes Vital signs. See below for your results today.   Medications prescribed:   Take any prescribed medications only as directed.  Follow-up instructions: Please follow-up with your primary care provider as soon as you can for further evaluation of your symptoms.   Return instructions:  SEEK IMMEDIATE MEDICAL ATTENTION IF: You have severe chest pain, especially if the pain is crushing or pressure-like and spreads to the arms, back, neck, or jaw, or if you have sweating, nausea (feeling sick to your stomach), or shortness of breath. THIS IS AN EMERGENCY. Don't wait to see if the pain will go away. Get medical help at once. Call 911 or 0 (operator). DO NOT drive yourself to the hospital.  Your chest pain gets worse and does not go away with rest.  You have an attack of chest pain lasting longer than usual, despite rest and treatment with the medications your caregiver has prescribed.  You wake from sleep with chest pain or shortness of breath. You feel dizzy or faint. You have chest pain not typical of your usual pain for which you originally saw your caregiver.  You have any other emergent concerns regarding your health.  Additional Information: Chest pain comes from many different causes. Your caregiver has diagnosed you as having chest pain that is not specific for one problem, but does not require admission.  You are at low risk for an acute heart condition or other serious illness.   Your vital signs today were: BP (!) 191/68 (BP Location: Right Arm)    Pulse 62    Temp 98.4 F (36.9 C) (Oral)    Resp 20    Wt 136.1 kg (300 lb)    SpO2 100%    BMI 64.92 kg/m  If your blood pressure (BP) was elevated above 135/85 this  visit, please have this repeated by your doctor within one month. --------------

## 2017-04-01 NOTE — ED Provider Notes (Signed)
Received patient care at the beginning of shift.  Patient here with atypical chest pain.  She was awaiting for a delta troponin.  She is chest pain-free.  Dr. troponin obtained and normal.  She is stable for discharge.  She will follow-up closely with her primary care provider for further evaluation.  BP (!) 184/60   Pulse (!) 55   Temp 98.4 F (36.9 C) (Oral)   Resp 19   Wt 136.1 kg (300 lb)   SpO2 99%   BMI 64.92 kg/m   The patient was noted to be hypertensive today in the emergency department. I have spoken with the patient regarding hypertension and the need for improved management. I instructed the patient to followup with the Primary care doctor within 4 days to improve the management of the patient's hypertension. I also counseled the patient regarding the signs and symptoms which would require an emergent visit to an emergency department for hypertensive urgency and/or hypertensive emergency. The patient understood the need for improved hypertensive management.   Results for orders placed or performed during the hospital encounter of 04/01/17  Basic metabolic panel  Result Value Ref Range   Sodium 139 135 - 145 mmol/L   Potassium 3.8 3.5 - 5.1 mmol/L   Chloride 107 101 - 111 mmol/L   CO2 26 22 - 32 mmol/L   Glucose, Bld 92 65 - 99 mg/dL   BUN 15 6 - 20 mg/dL   Creatinine, Ser 0.88 0.44 - 1.00 mg/dL   Calcium 8.6 (L) 8.9 - 10.3 mg/dL   GFR calc non Af Amer >60 >60 mL/min   GFR calc Af Amer >60 >60 mL/min   Anion gap 6 5 - 15  CBC  Result Value Ref Range   WBC 9.2 4.0 - 10.5 K/uL   RBC 4.21 3.87 - 5.11 MIL/uL   Hemoglobin 9.5 (L) 12.0 - 15.0 g/dL   HCT 11.0 (L) 31.5 - 94.5 %   MCV 73.2 (L) 78.0 - 100.0 fL   MCH 22.6 (L) 26.0 - 34.0 pg   MCHC 30.8 30.0 - 36.0 g/dL   RDW 85.9 (H) 29.2 - 44.6 %   Platelets 214 150 - 400 K/uL  Brain natriuretic peptide  Result Value Ref Range   B Natriuretic Peptide 206.8 (H) 0.0 - 100.0 pg/mL  I-stat troponin, ED  Result Value Ref Range    Troponin i, poc 0.02 0.00 - 0.08 ng/mL   Comment 3          I-stat troponin, ED  Result Value Ref Range   Troponin i, poc 0.01 0.00 - 0.08 ng/mL   Comment 3           Dg Chest 2 View  Result Date: 04/01/2017 CLINICAL DATA:  Midline left chest pain, shortness of Breath EXAM: CHEST  2 VIEW COMPARISON:  02/17/2015 FINDINGS: Prominence of the hila bilaterally is stable, likely vascular. Linear atelectasis in the lingula. No focal opacity on the right. No effusions. No overt edema. IMPRESSION: Cardiomegaly, vascular congestion.  Lingular atelectasis. Electronically Signed   By: Charlett Nose M.D.   On: 04/01/2017 14:58      Fayrene Helper, PA-C 04/01/17 1813    Melene Plan, DO 04/01/17 [phone]

## 2017-04-01 NOTE — ED Triage Notes (Signed)
Per EMS, patient coming from [facility] with a complaint of chest pain last night.  Not complaining of any pain, N/V, SOB as of right now.  Hx of CHF (non-compliant), DM, and Dementia.  A/O x 3 (did not know year).  On Levaquin for unknown reason. 12 Lead unremarkable.  Lungs clear.  Mucous in throat.  156 palpated systolic.  97% RA, CBG 126, 70 HR, RR 14.

## 2017-04-01 NOTE — ED Provider Notes (Signed)
[facility] EMERGENCY DEPARTMENT Provider Note   CSN: [id number] Arrival date & time: 04/01/17  1244     History   Chief Complaint Chief Complaint  Patient presents with  . Chest Pain    HPI [name] is a 61 y.o. female.  HPI  61 y.o. female with a hx of Asthma, CAD, Dementia, Diastolic CHF, HTN, presents to the Emergency Department today via EMS from [place] [facility] due to chest pressure last night. Pt states this occurred after eating last night and lying down. Noted pressure on left side of chest. Proceeded to sit up and have x1 bout of emesis with relief of symptoms. States symptoms lasted a few seconds. No diaphoresis. No numbness/tingling. Denies pain currently. No shortness of breath. No abdominal pain. No fevers. Notes mild cough that is non productive. No congestion. No meds PTA. Pt states that she just wants to eat something since the doctors took her food away at the facility and told her to come to the ED. No other symptoms noted .  Echocardiogram 02-20-15 - Left ventricle: The cavity size was normal. Wall thickness was   increased in a pattern of severe LVH. Systolic function was   normal. The estimated ejection fraction was in the range of 55%   to 60%. Wall motion was normal; there were no regional wall   motion abnormalities. Doppler parameters are consistent with   restrictive physiology, indicative of decreased left ventricular   diastolic compliance and/or increased left atrial pressure.   Doppler parameters are consistent with high ventricular filling   pressure. - Mitral valve: Calcified annulus. - Left atrium: The atrium was mildly dilated. - Pericardium, extracardiac: A small pericardial effusion was   identified.  Past Medical History:  Diagnosis Date  . Accelerated hypertension   . Arthritis   . Asthma   . Bilateral chronic knee pain   . Chronic bronchitis (HCC)   . Coronary artery disease    a. remote hx of cath ~2000. B.  cath  moderate disease in an RPL branch - too distal for PCI; 3rd RPLB lesion, 50% stenosed; hyperdynamic LV with near cavity obliteration & severely elevated LVEDP  . Dementia   . Diastolic CHF (HCC)   . Diverticular disease of large intestine    diverticular abscess in sigmoid region by CT 2009  . Heart attack (HCC)   . Heart murmur   . Hypertension   . Non-ischemic cardiomyopathy (HCC)    cxr - mild cardiomegly, Echo 10/07/14 - severe LVH with LVEF 65-70%.  . Stroke (HCC)   . Wheelchair bound     Patient Active Problem List   Diagnosis Date Noted  . COPD (chronic obstructive pulmonary disease) (HCC) 06/01/2016  . Depression 04/11/2016  . 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 Chronic pain 12/01/2015  . Chronic diastolic heart failure (HCC) 12/01/2015  . Moderate dementia with behavioral disturbance 08/01/2015  . Chronic respiratory failure- on home O2 02/17/2015  . Hypertensive heart disease 11/08/2014  . HLD (hyperlipidemia) 11/08/2014  . Diverticular disease of large intestine   . Stroke (HCC)   . CAD (coronary artery disease)   . Hypokalemia 04/28/2012  . Morbid obesity (HCC) 03/26/2006  . GERD 03/26/2006  . Osteoarthritis 03/26/2006    Past Surgical History:  Procedure Laterality Date  . ABDOMINAL HYSTERECTOMY    . AMPUTATION  08/21/2011   Procedure: AMPUTATION DIGIT;  Surgeon: Sharma Covert, MD;  Location: [facility] OR;  Service: Orthopedics;  Laterality: Right;  . CARDIAC  CATHETERIZATION N/A 10/09/2014   Procedure: Left Heart Cath and Coronary Angiography;  Surgeon: Marykay Lex, MD;  Location: [facility] INVASIVE CV LAB CUPID;  Service: Cardiovascular;  Laterality: N/A;  . CARDIAC CATHETERIZATION  10/2014   normal coronary arteries aside for a distal RPLB branch with 50% stenosis  . CORONARY STENT PLACEMENT    . FLEXIBLE SIGMOIDOSCOPY  04/28/2012   Procedure: FLEXIBLE SIGMOIDOSCOPY;  Surgeon: Petra Kuba, MD;  Location: [facility] ENDOSCOPY;  Service: Endoscopy;  Laterality: N/A;   unsedated, unprepped  . FLEXIBLE SIGMOIDOSCOPY N/A 12/10/2015   Procedure: FLEXIBLE SIGMOIDOSCOPY;  Surgeon: Bernette Redbird, MD;  Location: [facility] ENDOSCOPY;  Service: Endoscopy;  Laterality: N/A;  . I&D EXTREMITY  08/18/2011   Procedure: IRRIGATION AND DEBRIDEMENT EXTREMITY;  Surgeon: Sharma Covert, MD;  Location: [facility] OR;  Service: Orthopedics;  Laterality: Right;  I&D Right Long Finger    OB History    Gravida Para Term Preterm AB Living   0 0 0 0 0     SAB TAB Ectopic Multiple Live Births   0 0 0           Home Medications    Prior to Admission medications   Medication Sig Start Date End Date Taking? Authorizing Provider  acetaminophen (TYLENOL) 325 MG tablet Take 650 mg by mouth every 12 (twelve) hours.    [provider]  albuterol (PROVENTIL HFA;VENTOLIN HFA) 108 (90 Base) MCG/ACT inhaler Inhale 2 puffs into the lungs every 6 (six) hours as needed for wheezing or shortness of breath.    [provider]  amLODipine (NORVASC) 10 MG tablet Take 1 tablet (10 mg total) by mouth daily. 12/07/15   Calvert Cantor, MD  aspirin EC 81 MG EC tablet Take 1 tablet (81 mg total) by mouth daily. 12/12/15   Johnson, Clanford L, MD  atorvastatin (LIPITOR) 20 MG tablet Take 1 tablet (20 mg total) by mouth daily at 6 PM. 02/17/15   Calvert Cantor, MD  baclofen (LIORESAL) 10 MG tablet Take 10 mg by mouth 3 (three) times daily as needed for muscle spasms.  11/07/15   [provider]  diclofenac sodium (VOLTAREN) 1 % GEL Apply 2 g topically 4 (four) times daily.    [provider]  donepezil (ARICEPT) 10 MG tablet Take 10 mg by mouth at bedtime.    [provider]  hydrALAZINE (APRESOLINE) 50 MG tablet Take 1 tablet (50 mg total) by mouth every 8 (eight) hours. 12/12/15   Johnson, Clanford L, MD  lisinopril (PRINIVIL,ZESTRIL) 5 MG tablet Take 5 mg by mouth daily.    [provider]  metoprolol succinate (TOPROL-XL) 100 MG 24 hr tablet Take 1 tablet (100 mg total) by  mouth daily. 12/12/15   Johnson, Clanford L, MD  omeprazole (PRILOSEC) 20 MG capsule Take 20 mg by mouth daily.    [provider]  oxycodone (OXY-IR) 5 MG capsule Take 1 capsule (5 mg total) by mouth every 4 (four) hours as needed for pain. 06/19/16   Sharon Seller, NP  risperiDONE (RISPERDAL) 0.5 MG tablet Take 0.5 mg by mouth at bedtime.    [provider]  sertraline (ZOLOFT) 25 MG tablet Take by mouth daily. Give 1.5 tablet by mouth one time a day    [provider]  traZODone (DESYREL) 50 MG tablet Take 50 mg by mouth at bedtime.    [provider]    Family History Family History  Problem Relation Age of Onset  . Diabetes  type I Mother   . Hypertension Mother   . Arthritis Mother   . Stroke Sister   . Diabetes type I Brother   . Hypertension Brother   . Healthy Brother   . Diabetes type II Brother   . Heart disease Brother   . Healthy Brother   . Healthy Brother   . Diabetes type I Sister   . Hypertension Sister   . Asthma Sister   . Bronchitis Sister     Social History Social History  Substance Use Topics  . Smoking status: Current Every Day Smoker    Packs/day: 0.50    Years: 20.00    Types: Cigarettes  . Smokeless tobacco: Former Neurosurgeon    Types: Snuff  . Alcohol use 3.6 oz/week    6 Cans of beer per week     Comment: 04/27/12- "amount varies"     Allergies   Penicillins   Review of Systems Review of Systems ROS reviewed and all are negative for acute change except as noted in the HPI.  Physical Exam Updated Vital Signs BP (!) 191/68 (BP Location: Right Arm)   Pulse 62   Temp 98.4 F (36.9 C) (Oral)   Resp 20   Wt 136.1 kg (300 lb)   SpO2 100%   BMI 64.92 kg/m   Physical Exam  Constitutional: She is oriented to person, place, and time. She appears well-developed and well-nourished. No distress.  Morbidly obese  HENT:  Head: Normocephalic and atraumatic.  Right Ear: Tympanic membrane, external ear and ear  canal normal.  Left Ear: Tympanic membrane, external ear and ear canal normal.  Nose: Nose normal.  Mouth/Throat: Uvula is midline, oropharynx is clear and moist and mucous membranes are normal. No trismus in the jaw. No oropharyngeal exudate, posterior oropharyngeal erythema or tonsillar abscesses.  Eyes: Pupils are equal, round, and reactive to light. EOM are normal.  Neck: Normal range of motion. Neck supple. No tracheal deviation present.  Cardiovascular: Normal rate, regular rhythm, S1 normal, S2 normal, normal heart sounds, intact distal pulses and normal pulses.   Pulmonary/Chest: Effort normal and breath sounds normal. No respiratory distress. She has no decreased breath sounds. She has no wheezes. She has no rhonchi. She has no rales.  Abdominal: Normal appearance and bowel sounds are normal. There is no tenderness.  Musculoskeletal: Normal range of motion.  Neurological: She is alert and oriented to person, place, and time.  Skin: Skin is warm and dry.  Psychiatric: She has a normal mood and affect. Her speech is normal and behavior is normal. Thought content normal.  Nursing note and vitals reviewed.  ED Treatments / Results  Labs (all labs ordered are listed, but only abnormal results are displayed) Labs Reviewed  BASIC METABOLIC PANEL - Abnormal; Notable for the following:       Result Value   Calcium 8.6 (*)    All other components within normal limits  CBC - Abnormal; Notable for the following:    Hemoglobin 9.5 (*)    HCT 30.8 (*)    MCV 73.2 (*)    MCH 22.6 (*)    RDW 18.7 (*)    All other components within normal limits  BRAIN NATRIURETIC PEPTIDE  I-STAT TROPONIN, ED    EKG  EKG Interpretation  Date/Time:  Wednesday April 01 2017 13:16:13 EDT Ventricular Rate:  61 PR Interval:    QRS Duration: 99 QT Interval:  424 QTC Calculation: 428 R Axis:   56 Text Interpretation:  Sinus rhythm Abnormal T, consider ischemia, diffuse leads Confirmed by Tilden Fossaees,  Elizabeth [phone]([id number]) on 04/01/2017 1:24:57 PM       Radiology Dg Chest 2 View  Result Date: 04/01/2017 CLINICAL DATA:  Midline left chest pain, shortness of Breath EXAM: CHEST  2 VIEW COMPARISON:  02/17/2015 FINDINGS: Prominence of the hila bilaterally is stable, likely vascular. Linear atelectasis in the lingula. No focal opacity on the right. No effusions. No overt edema. IMPRESSION: Cardiomegaly, vascular congestion.  Lingular atelectasis. Electronically Signed   By: Charlett NoseKevin  Dover M.D.   On: 04/01/2017 14:58    Procedures Procedures (including critical care time)  Medications Ordered in ED Medications  furosemide (LASIX) injection 40 mg (not administered)     Initial Impression / Assessment and Plan / ED Course  I have reviewed the triage vital signs and the nursing notes.  Pertinent labs & imaging results that were available during my care of the patient were reviewed by me and considered in my medical decision making (see chart for details).  Final Clinical Impressions(s) / ED Diagnoses  {I have reviewed and evaluated the relevant laboratory values. {I have reviewed and evaluated the relevant imaging studies. {I have interpreted the relevant EKG. {I have reviewed the relevant previous healthcare records. {I have reviewed EMS Documentation. {I obtained HPI from historian. {Patient discussed with supervising physician.  ED Course:  Assessment: Pt is a 61 y.o. female with a hx of Asthma, CAD, Dementia, Diastolic CHF, HTN, presents to the Emergency Department today via EMS from [facility] due to chest pressure last night. Pt states this occurred after eating last night and lying down. Noted pressure on left side of chest. Proceeded to sit up and have x1 bout of emesis with relief of symptoms. States symptoms lasted a few seconds. No diaphoresis. No numbness/tingling. Denies pain currently. No shortness of breath. No abdominal pain. No fevers. Notes mild cough that is non productive. No  congestion. No meds PTA. Pt states that she just wants to eat something since the doctors took her food away at the facility and told her to come to the ED.Marland Kitchen. Patient is to be discharged with recommendation to follow up with PCP in regards to today's hospital visit. Chest pain is not likely of cardiac or pulmonary etiology d/t presentation, perc negative, VSS, no tracheal deviation, no JVD or new murmur, RRR, breath sounds equal bilaterally, EKG without acute abnormalities, negative troponin, and negative CXR. Doubt cardiac etiology. Pt with symptoms s/p ingestion of meal and lied down. Likely gastric etiology. Pending Delta troponin. If unremarkable, pt has been advised start a PPI and return to the ED is CP becomes exertional, associated with diaphoresis or nausea, radiates to left jaw/arm, worsens or becomes concerning in any way.   Disposition/Plan:  Anticipate DC Home 3:07 PM- Sign out to Fayrene HelperBowie Tran, PA-C Pt acknowledges and agrees with plan  Supervising Physician Tilden Fossaees, Elizabeth, MD  Final diagnoses:  Chest pain, unspecified type    New Prescriptions New Prescriptions   No medications on file     Wilber BihariMohr, Gwyneth Fernandez, PA-C 04/01/17 1509    Tilden Fossaees, Elizabeth, MD 04/02/17 [phone]

## 2017-04-01 NOTE — ED Notes (Addendum)
Waiting for PTAR 

## 2017-09-15 ENCOUNTER — Encounter: Payer: Self-pay | Admitting: Physician Assistant

## 2017-09-15 ENCOUNTER — Ambulatory Visit (INDEPENDENT_AMBULATORY_CARE_PROVIDER_SITE_OTHER): Payer: Medicaid Other | Admitting: Physician Assistant

## 2017-09-15 VITALS — BP 135/67 | HR 58 | Ht 60.0 in

## 2017-09-15 DIAGNOSIS — I2510 Atherosclerotic heart disease of native coronary artery without angina pectoris: Secondary | ICD-10-CM | POA: Diagnosis not present

## 2017-09-15 DIAGNOSIS — I10 Essential (primary) hypertension: Secondary | ICD-10-CM

## 2017-09-15 DIAGNOSIS — J9610 Chronic respiratory failure, unspecified whether with hypoxia or hypercapnia: Secondary | ICD-10-CM | POA: Diagnosis not present

## 2017-09-15 DIAGNOSIS — I5032 Chronic diastolic (congestive) heart failure: Secondary | ICD-10-CM | POA: Diagnosis not present

## 2017-09-15 MED ORDER — FUROSEMIDE 80 MG PO TABS: 80.0000 mg | ORAL_TABLET | Freq: Every day | ORAL | 2 refills | Status: DC

## 2017-09-15 NOTE — Patient Instructions (Addendum)
Medication Instructions:  INCREASE Lasix to 80mg  in the morning and 40mg  in the pm  Labwork: Your physician recommends that you return for lab work in: 1 wk BMET  Testing/Procedures: Your physician has requested that you have an echocardiogram. Echocardiography is a painless test that uses sound waves to create images of your heart. It provides your doctor with information about the size and shape of your heart and how well your heart's chambers and valves are working. This procedure takes approximately one hour. There are no restrictions for this procedure.  [address]  Follow-Up: Your physician recommends that you schedule a follow-up appointment in: 2 WKS WITH HAO MENG, PA on a day Dr Royann Shivers is in the office  Any Other Special Instructions Will Be Listed Below (If Applicable).  If you need a refill on your cardiac medications before your next appointment, please call your pharmacy.

## 2017-09-15 NOTE — Progress Notes (Signed)
Cardiology Office Note    Date:  09/15/2017   ID:  [name], DOB September 18, 1955, MRN [id number]  PCP:  Jackie Plum, MD  Cardiologist:  Dr. Royann Shivers (apparently patient was seen by Dr. Salena Saner in the hospital in 2016, has not been seen by Dr. Royann Shivers in the clinic)  Chief Complaint  Patient presents with  . Follow-up    eval for possible CHF. Referred by Wandra Mannan NP     History of Present Illness:  [name] is a 62 y.o. female with PMH of CAD, HTN, h/o CVA, asthma and tobacco abuse.  She was last seen by Tereso Newcomer in 2016.  It was noted she was wheelchair-bound due to knee issues.  She was last admitted in April 2016 with chest discomfort in the setting of uncontrolled high blood pressure.  Cardiac catheterization in May 2016 demonstrated 50% stenosis in the third branch of RPLB, this was far too distal to consider PCI.  She otherwise had normal coronaries. Cardiac catheterization also showed hyperdynamic LV with severe systolic hypertension and LVEDP 30-35%.  She also noted to have near cavity obliteration.  When she was seen during the last office visit, she already ran out of all of her medication for 2 months, some of her medication were restarted.  She was readmitted to the hospital in September 2016 with chest pain.  Echocardiogram obtained during September admission showed a normal EF, speckled appearance of myocardium with potential consideration for amyloid.  Attempts were made to obtain cardiac MRI to further evaluate, however she was unable to be fit in the scanner due to obesity.  Also she did not have low voltage on her EKG as well.  She was eventually discharged to follow-up with Dr. Royann Shivers as outpatient to potentially consider fat pad biopsy.  This never occurred.  She was seen in the ED on 04/01/2017 again with chest pain, however this was felt not to be cardiac in nature and more consistent with GI issues.  She was started on PPI and is subsequently  discharged. Her weight at the time was 300 lbs and BMI 64.92.  More recently, patient has been admitted to [facility] for increased care need.  She was diagnosed with microcytic anemia and has upcoming GI referral for positive stool test.  Patient was also started on metolazone as well from heart failure need.  Based on recent office note by Wandra Mannan NP on 08/20/2017, there was some consideration of changing diuretic from Lasix.  Patient presents today for cardiology office visit, she says that she is mainly bedbound by this point.  She was transported to office via EMS.  She was also coughing upon arrival.  On physical exam, anterior examination showed no obvious crackle, she also does not have any obvious lower extremity pitting edema on exam as well.  Her chronic respiratory failure likely is combination of chronic diastolic heart failure, obesity hypoventilation syndrome and deconditioning.  She says that she has been short of breath for a long time.  It is difficult to assess volume status on this patient who is morbidly obese.  We were unable to measure her weight in the office today.  Her O2 saturation is 95%.  I do not think her chronic respiratory failure is all due to volume overload, I think some of which is likely related to deconditioning as well as obesity hypoventilation syndrome.  I recommended a trial of increase Lasix to 80 mg a.m. and  40 mg p.m. to see if it will help with her breathing a little bit more.  She will need 20 mEq daily of potassium supplement.  She will need basic metabolic panel in 1 week.  I plan to repeat echocardiogram to assess her LVH and speckled appearance of myocardium as seen on the previous echocardiogram.  I will see her back in 2 weeks for reassessment.    Past Medical History:  Diagnosis Date  . Accelerated hypertension   . Arthritis   . Asthma   . Bilateral chronic knee pain   . Chronic bronchitis (HCC)   .  Coronary artery disease    a. remote hx of cath ~2000. B. cath  moderate disease in an RPL branch - too distal for PCI; 3rd RPLB lesion, 50% stenosed; hyperdynamic LV with near cavity obliteration & severely elevated LVEDP  . Dementia   . Diastolic CHF (HCC)   . Diverticular disease of large intestine    diverticular abscess in sigmoid region by CT 2009  . Heart attack (HCC)   . Heart murmur   . Hypertension   . Non-ischemic cardiomyopathy (HCC)    cxr - mild cardiomegly, Echo 10/07/14 - severe LVH with LVEF 65-70%.  . Stroke (HCC)   . Wheelchair bound     Past Surgical History:  Procedure Laterality Date  . ABDOMINAL HYSTERECTOMY    . AMPUTATION  08/21/2011   Procedure: AMPUTATION DIGIT;  Surgeon: Sharma Covert, MD;  Location[facility] OR;  Service: Orthopedics;  Laterality: Right;  . CARDIAC CATHETERIZATION N/A 10/09/2014   Procedure: Left Heart Cath and Coronary Angiography;  Surgeon: Marykay Lex, MD;  Location: [facility] INVASIVE CV LAB CUPID;  Service: Cardiovascular;  Laterality: N/A;  . CARDIAC CATHETERIZATION  10/2014   normal coronary arteries aside for a distal RPLB branch with 50% stenosis  . CORONARY STENT PLACEMENT    . FLEXIBLE SIGMOIDOSCOPY  04/28/2012   Procedure: FLEXIBLE SIGMOIDOSCOPY;  Surgeon: Petra Kuba, MD;  Location: [facility] ENDOSCOPY;  Service: Endoscopy;  Laterality: N/A;  unsedated, unprepped  . FLEXIBLE SIGMOIDOSCOPY N/A 12/10/2015   Procedure: FLEXIBLE SIGMOIDOSCOPY;  Surgeon: Bernette Redbird, MD;  Location: [facility]OSCOPY;  Service: Endoscopy;  Laterality: N/A;  . I&D EXTREMITY  08/18/2011   Procedure: IRRIGATION AND DEBRIDEMENT EXTREMITY;  Surgeon: Sharma Covert, MD;  Location: [facility] OR;  Service: Orthopedics;  Laterality: Right;  I&D Right Long Finger    Current Medications: Outpatient Medications Prior to Visit  Medication Sig Dispense Refill  . albuterol (PROVENTIL HFA;VENTOLIN HFA) 108 (90 Base) MCG/ACT inhaler Inhale 2 puffs into the lungs every 6 (six) hours as needed  for wheezing or shortness of breath.    Marland Kitchen aspirin EC 81 MG EC tablet Take 1 tablet (81 mg total) by mouth daily.    Marland Kitchen atorvastatin (LIPITOR) 20 MG tablet Take 1 tablet (20 mg total) by mouth daily at 6 PM. 30 tablet 5  . baclofen (LIORESAL) 10 MG tablet Take 10 mg by mouth 3 (three) times daily.   2  . carvedilol (COREG) 3.125 MG tablet Take 3.125 mg by mouth 2 (two) times daily with a meal.    . cloNIDine (CATAPRES) 0.1 MG tablet Take 0.1 mg by mouth 3 (three) times daily.    Marland Kitchen donepezil (ARICEPT) 10 MG tablet Take 10 mg by mouth at bedtime.    . FeFum-FePoly-FA-B Cmp-C-Biot (INTEGRA PLUS) CAPS Take 1 capsule by mouth 2 (two) times daily.    Marland Kitchen guaiFENesin (MUCINEX) 600 MG 12  hr tablet Take 600 mg by mouth 2 (two) times daily.    . hydrALAZINE (APRESOLINE) 100 MG tablet Take 100 mg by mouth 3 (three) times daily.    Marland Kitchen HYDROcodone-acetaminophen (NORCO/VICODIN) 5-325 MG tablet Take 1 tablet by mouth every 6 (six) hours as needed for moderate pain.    Marland Kitchen lisinopril (PRINIVIL,ZESTRIL) 40 MG tablet Take 40 mg by mouth daily.    . metolazone (ZAROXOLYN) 2.5 MG tablet Take 2.5 mg by mouth daily.    Marland Kitchen omeprazole (PRILOSEC) 40 MG capsule Take 40 mg by mouth daily.    . Probiotic Product (PROBIOTIC-10 PO) Take 1 capsule by mouth daily.    . sertraline (ZOLOFT) 50 MG tablet Take 50 mg by mouth daily. Give 1.5 tablet by mouth one time a day     . furosemide (LASIX) 80 MG tablet Take 80 mg by mouth daily.    Marland Kitchen amLODipine (NORVASC) 10 MG tablet Take 1 tablet (10 mg total) by mouth daily. (Patient not taking: Reported on 04/01/2017)    . furosemide (LASIX) 40 MG tablet Take 40 mg by mouth daily.    . hydrALAZINE (APRESOLINE) 50 MG tablet Take 1 tablet (50 mg total) by mouth every 8 (eight) hours. (Patient taking differently: Take 100 mg by mouth 2 (two) times daily. ) 30 tablet 0  . lisinopril (PRINIVIL,ZESTRIL) 10 MG tablet Take 10 mg by mouth daily.     . metoprolol succinate (TOPROL-XL) 100 MG 24 hr tablet  Take 1 tablet (100 mg total) by mouth daily. (Patient not taking: Reported on 04/01/2017) 30 tablet 0  . oxycodone (OXY-IR) 5 MG capsule Take 1 capsule (5 mg total) by mouth every 4 (four) hours as needed for pain. (Patient not taking: Reported on 04/01/2017) 240 capsule 0   No facility-administered medications prior to visit.      Allergies:   Penicillins   Social History   Socioeconomic History  . Marital status: Legally Separated    Spouse name: Not on file  . Number of children: Not on file  . Years of education: Not on file  . Highest education level: Not on file  Occupational History  . Not on file  Social Needs  . Financial resource strain: Not on file  . Food insecurity:    Worry: Not on file    Inability: Not on file  . Transportation needs:    Medical: Not on file    Non-medical: Not on file  Tobacco Use  . Smoking status: Current Every Day Smoker    Packs/day: 0.50    Years: 20.00    Pack years: 10.00    Types: Cigarettes  . Smokeless tobacco: Former Neurosurgeon    Types: Snuff  Substance and Sexual Activity  . Alcohol use: Yes    Alcohol/week: 3.6 oz    Types: 6 Cans of beer per week    Comment: 04/27/12- "amount varies"  . Drug use: No  . Sexual activity: Never  Lifestyle  . Physical activity:    Days per week: Not on file    Minutes per session: Not on file  . Stress: Not on file  Relationships  . Social connections:    Talks on phone: Not on file    Gets together: Not on file    Attends religious service: Not on file    Active member of club or organization: Not on file    Attends meetings of clubs or organizations: Not on file    Relationship status: Not  on file  Other Topics Concern  . Not on file  Social History Narrative   ** Merged History Encounter **         Family History:  The patient's family history includes Arthritis in her mother; Asthma in her sister; Bronchitis in her sister; Diabetes type I in her brother, mother, and sister;  Diabetes type II in her brother; Healthy in her brother, brother, and brother; Heart disease in her brother; Hypertension in her brother, mother, and sister; Stroke in her sister.   ROS:   Please see the history of present illness.    ROS All other systems reviewed and are negative.   PHYSICAL EXAM:   VS:  BP 135/67   Pulse (!) 58   Ht 5' (1.524 m)   BMI 58.59 kg/m    GEN: frail, morbidly obese, strapped to EMS stretcher HEENT: normal  Neck: no JVD, carotid bruits, or masses Cardiac: RRR; no murmurs, rubs, or gallops,no edema  Respiratory:  Anterior exam clear to auscultation bilaterally, normal work of breathing GI: soft, nontender, nondistended, + BS MS: no deformity or atrophy  Skin: warm and dry, no rash Neuro:  Alert and Oriented x 3, Strength and sensation are intact Psych: euthymic mood, full affect  Wt Readings from Last 3 Encounters:  04/01/17 300 lb (136.1 kg)  09/02/16 275 lb (124.7 kg)  08/27/16 275 lb (124.7 kg)      Studies/Labs Reviewed:   EKG:  EKG is not ordered today.   Recent Labs: 04/01/2017: B Natriuretic Peptide 206.8; BUN 15; Creatinine, Ser 0.64; Hemoglobin 9.5; Platelets 214; Potassium 3.8; Sodium 139   Lipid Panel    Component Value Date/Time   CHOL 169 09/03/2016   TRIG 129 09/03/2016   HDL 51 09/03/2016   CHOLHDL 3.3 10/07/2014 0442   VLDL 22 10/07/2014 0442   LDLCALC 92 09/03/2016    Additional studies/ records that were reviewed today include:   Cath 10/09/14    Conclusion     Angiographicall normal Coronary Arteries with moderate disease in an RPL branch - too distal for PCI  3rd RPLB lesion, 50% stenosed.  Hyperdynamic LV with near cavity obliteration & severely elevated LVEDP  Severe systemic HTN  Etiology for + Troponin is most likely related to Accelerated HTN. No Angiographically significant CAD. Hyperdynamic LV with Severe Systolic HTN & LVEDP of ~30-35 mmHg - but near cavity obliteration      Echo  02/20/2015 LV EF: 55% -   60%  Study Conclusions  - Left ventricle: The cavity size was normal. Wall thickness was   increased in a pattern of severe LVH. Systolic function was   normal. The estimated ejection fraction was in the range of 55%   to 60%. Wall motion was normal; there were no regional wall   motion abnormalities. Doppler parameters are consistent with   restrictive physiology, indicative of decreased left ventricular   diastolic compliance and/or increased left atrial pressure.   Doppler parameters are consistent with high ventricular filling   pressure. - Mitral valve: Calcified annulus. - Left atrium: The atrium was mildly dilated. - Pericardium, extracardiac: A small pericardial effusion was   identified.  Impressions:  - Normal LV systolic function; restrictive filling with elevated LV   filling pressure; severe LVH; mild LAE; small pericardial   effusion; myocardium with speckled appearance; consider amyloid.   ASSESSMENT:    1. Chronic diastolic heart failure (HCC)   2. Coronary artery disease involving native coronary artery of native  heart without angina pectoris   3. Essential hypertension   4. Chronic respiratory failure, unspecified whether with hypoxia or hypercapnia (HCC)   5. Morbid obesity (HCC)      PLAN:  In order of problems listed above:  1. Chronic diastolic heart failure: It is very difficult to tell her volume status based on physical exam she is morbidly obese.  I did not appreciate significant lower extremity edema.  She was dropped to a EMS stretcher during our interview, anterior examination did not reveal obvious crackles.  She says that she has chronic dyspnea and has been essentially bedbound for the past year with extremely limited mobility.  I wish to pursue a trial of increased diuretic to 80 mg in the morning and 40 mg in the afternoon.  She is also on a daily dosing of 2.5mg  metolazone.  We will need to monitor her renal  function very closely.  She has been instructed to obtain basic metabolic panel in 1 week.  I am not confident that her dyspnea is all due to diastolic heart failure as I think she has multiple reasons to be short of breath.  2. Chronic respiratory failure: This is a patient who has very poor quality of life who is essentially bedbound at [facility].  Her chronic respiratory failure is likely due to combination of obesity hypoventilation syndrome, deconditioning, and maybe to a small degree chronic diastolic heart failure.  3. Abnormal echocardiogram: Previous echocardiogram showed severe LVH with speckled appearance consistent with amyloid.  She was supposed to be discharged to follow-up with Dr. Royann Shivers as outpatient to see if she will be a candidate for fat pad biopsy.  Given the severe limitation to her life at this point, I am not sure a fat pad biopsy will improve her quality of life.  I do recommend repeat echocardiogram to reassess her overall cardiac condition given the need for diuretic.  4. CAD: Denies any chest pain, previous cardiac catheterization showed nonobstructive disease in the distal RPLB, otherwise clean coronary artery.  5. Hypertension: There has been multiple medication that has changed since 2016.  She is no longer on amlodipine.  Her lisinopril dosage has been maximized.  Instead of high dose metoprolol, she is now taking very low-dose carvedilol.  Her hydralazine has also been increased as well.  Her regimen seems to control her blood pressure quite well.  6. Morbid obesity: Obesity is a major factor that contributed to her immobility here.  Her atrial prognosis is severely limited given the inability to lose weight.  Although she has never been tested for obstructive sleep apnea, she is also at very high risk for his.    Medication Adjustments/Labs and Tests Ordered: Current medicines are reviewed at length with the patient today.  Concerns regarding medicines are  outlined above.  Medication changes, Labs and Tests ordered today are listed in the Patient Instructions below. Patient Instructions  Medication Instructions:  INCREASE Lasix to 80mg  in the morning and 40mg  in the pm  Labwork: Your physician recommends that you return for lab work in: 1 wk BMET  Testing/Procedures: Your physician has requested that you have an echocardiogram. Echocardiography is a painless test that uses sound waves to create images of your heart. It provides your doctor with information about the size and shape of your heart and how well your heart's chambers and valves are working. This procedure takes approximately one hour. There are no restrictions for this procedure.  [address]  Follow-Up: Your physician recommends that you schedule a follow-up appointment in: 2 WKS WITH Jerric Oyen, PA on a day Dr Royann Shivers is in the office  Any Other Special Instructions Will Be Listed Below (If Applicable).  If you need a refill on your cardiac medications before your next appointment, please call your pharmacy.     Ramond Dial, [place]  09/15/2017 10:43 PM    [facility] [address] [place], [place], [place]  [address] Phone: [phone]; Fax: [phone]

## 2017-09-16 ENCOUNTER — Telehealth: Payer: Self-pay

## 2017-09-16 NOTE — Telephone Encounter (Signed)
Per Wynema Birch attempted to call patients facility to inform patients charge nurse to start patient on Potassium 20 meq qd; I was unable to get through the phone just made a beeping noise. I will try again later.

## 2017-09-18 NOTE — Telephone Encounter (Signed)
Attempted to call [facility] Axon 3 times and was unsuccessful in speaking with someone; the secretary transferred me to the charge nurse line phone rung for 3 minutes then hung up. I called again and the secretary stated they were in a meeting and I should call back in 45 minutes.   I will try again later.

## 2017-09-21 ENCOUNTER — Inpatient Hospital Stay (HOSPITAL_COMMUNITY)
Admission: EM | Admit: 2017-09-21 | Discharge: 2017-09-24 | DRG: 292 | Disposition: A | Discharge status: Skilled Nursing Facility | Payer: Medicaid Other | Attending: Internal Medicine | Admitting: Internal Medicine

## 2017-09-21 ENCOUNTER — Encounter (HOSPITAL_COMMUNITY): Payer: Self-pay | Admitting: *Deleted

## 2017-09-21 ENCOUNTER — Telehealth: Payer: Self-pay | Admitting: Physician Assistant

## 2017-09-21 ENCOUNTER — Emergency Department (HOSPITAL_COMMUNITY): Payer: Medicaid Other

## 2017-09-21 DIAGNOSIS — F0281 Dementia in other diseases classified elsewhere with behavioral disturbance: Secondary | ICD-10-CM | POA: Diagnosis present

## 2017-09-21 DIAGNOSIS — Z8673 Personal history of transient ischemic attack (TIA), and cerebral infarction without residual deficits: Secondary | ICD-10-CM

## 2017-09-21 DIAGNOSIS — D649 Anemia, unspecified: Secondary | ICD-10-CM | POA: Diagnosis present

## 2017-09-21 DIAGNOSIS — F17210 Nicotine dependence, cigarettes, uncomplicated: Secondary | ICD-10-CM | POA: Diagnosis present

## 2017-09-21 DIAGNOSIS — N179 Acute kidney failure, unspecified: Secondary | ICD-10-CM | POA: Diagnosis present

## 2017-09-21 DIAGNOSIS — I5033 Acute on chronic diastolic (congestive) heart failure: Secondary | ICD-10-CM | POA: Diagnosis not present

## 2017-09-21 DIAGNOSIS — I639 Cerebral infarction, unspecified: Secondary | ICD-10-CM | POA: Diagnosis present

## 2017-09-21 DIAGNOSIS — Z9981 Dependence on supplemental oxygen: Secondary | ICD-10-CM

## 2017-09-21 DIAGNOSIS — I2510 Atherosclerotic heart disease of native coronary artery without angina pectoris: Secondary | ICD-10-CM | POA: Diagnosis present

## 2017-09-21 DIAGNOSIS — Z8261 Family history of arthritis: Secondary | ICD-10-CM

## 2017-09-21 DIAGNOSIS — Z5320 Procedure and treatment not carried out because of patient's decision for unspecified reasons: Secondary | ICD-10-CM | POA: Diagnosis present

## 2017-09-21 DIAGNOSIS — F0391 Unspecified dementia with behavioral disturbance: Secondary | ICD-10-CM | POA: Diagnosis present

## 2017-09-21 DIAGNOSIS — F03B18 Unspecified dementia, moderate, with other behavioral disturbance: Secondary | ICD-10-CM | POA: Diagnosis present

## 2017-09-21 DIAGNOSIS — Z89021 Acquired absence of right finger(s): Secondary | ICD-10-CM

## 2017-09-21 DIAGNOSIS — R0600 Dyspnea, unspecified: Secondary | ICD-10-CM

## 2017-09-21 DIAGNOSIS — I252 Old myocardial infarction: Secondary | ICD-10-CM

## 2017-09-21 DIAGNOSIS — R0602 Shortness of breath: Secondary | ICD-10-CM

## 2017-09-21 DIAGNOSIS — Z833 Family history of diabetes mellitus: Secondary | ICD-10-CM

## 2017-09-21 DIAGNOSIS — I5032 Chronic diastolic (congestive) heart failure: Secondary | ICD-10-CM | POA: Diagnosis present

## 2017-09-21 DIAGNOSIS — Z90710 Acquired absence of both cervix and uterus: Secondary | ICD-10-CM

## 2017-09-21 DIAGNOSIS — E860 Dehydration: Secondary | ICD-10-CM | POA: Diagnosis present

## 2017-09-21 DIAGNOSIS — Z79899 Other long term (current) drug therapy: Secondary | ICD-10-CM

## 2017-09-21 DIAGNOSIS — Z993 Dependence on wheelchair: Secondary | ICD-10-CM

## 2017-09-21 DIAGNOSIS — Z7982 Long term (current) use of aspirin: Secondary | ICD-10-CM

## 2017-09-21 DIAGNOSIS — I509 Heart failure, unspecified: Secondary | ICD-10-CM

## 2017-09-21 DIAGNOSIS — Z823 Family history of stroke: Secondary | ICD-10-CM

## 2017-09-21 DIAGNOSIS — E785 Hyperlipidemia, unspecified: Secondary | ICD-10-CM | POA: Diagnosis present

## 2017-09-21 DIAGNOSIS — F329 Major depressive disorder, single episode, unspecified: Secondary | ICD-10-CM | POA: Diagnosis present

## 2017-09-21 DIAGNOSIS — Z880 Allergy status to penicillin: Secondary | ICD-10-CM

## 2017-09-21 DIAGNOSIS — G3183 Dementia with Lewy bodies: Secondary | ICD-10-CM | POA: Diagnosis present

## 2017-09-21 DIAGNOSIS — F32A Depression, unspecified: Secondary | ICD-10-CM | POA: Diagnosis present

## 2017-09-21 DIAGNOSIS — M1990 Unspecified osteoarthritis, unspecified site: Secondary | ICD-10-CM | POA: Diagnosis present

## 2017-09-21 DIAGNOSIS — Z8249 Family history of ischemic heart disease and other diseases of the circulatory system: Secondary | ICD-10-CM

## 2017-09-21 DIAGNOSIS — Z79891 Long term (current) use of opiate analgesic: Secondary | ICD-10-CM

## 2017-09-21 DIAGNOSIS — J9610 Chronic respiratory failure, unspecified whether with hypoxia or hypercapnia: Secondary | ICD-10-CM | POA: Diagnosis present

## 2017-09-21 DIAGNOSIS — I110 Hypertensive heart disease with heart failure: Principal | ICD-10-CM | POA: Diagnosis present

## 2017-09-21 DIAGNOSIS — R011 Cardiac murmur, unspecified: Secondary | ICD-10-CM | POA: Diagnosis present

## 2017-09-21 DIAGNOSIS — I428 Other cardiomyopathies: Secondary | ICD-10-CM | POA: Diagnosis present

## 2017-09-21 DIAGNOSIS — Z825 Family history of asthma and other chronic lower respiratory diseases: Secondary | ICD-10-CM

## 2017-09-21 DIAGNOSIS — J449 Chronic obstructive pulmonary disease, unspecified: Secondary | ICD-10-CM | POA: Diagnosis present

## 2017-09-21 DIAGNOSIS — I25118 Atherosclerotic heart disease of native coronary artery with other forms of angina pectoris: Secondary | ICD-10-CM | POA: Diagnosis not present

## 2017-09-21 DIAGNOSIS — K219 Gastro-esophageal reflux disease without esophagitis: Secondary | ICD-10-CM | POA: Diagnosis present

## 2017-09-21 DIAGNOSIS — Z6841 Body Mass Index (BMI) 40.0 and over, adult: Secondary | ICD-10-CM

## 2017-09-21 DIAGNOSIS — Z7401 Bed confinement status: Secondary | ICD-10-CM

## 2017-09-21 LAB — CBC WITH DIFFERENTIAL/PLATELET
Basophils Absolute: 0 10*3/uL (ref 0.0–0.1)
Basophils Relative: 0 %
EOS PCT: 1 %
Eosinophils Absolute: 0.1 10*3/uL (ref 0.0–0.7)
HEMATOCRIT: 31.2 % — AB (ref 36.0–46.0)
HEMOGLOBIN: 9.3 g/dL — AB (ref 12.0–15.0)
LYMPHS PCT: 29 %
Lymphs Abs: 3.2 10*3/uL (ref 0.7–4.0)
MCH: 20.7 pg — ABNORMAL LOW (ref 26.0–34.0)
MCHC: 29.8 g/dL — ABNORMAL LOW (ref 30.0–36.0)
MCV: 69.3 fL — AB (ref 78.0–100.0)
MONOS PCT: 5 %
Monocytes Absolute: 0.6 10*3/uL (ref 0.1–1.0)
NEUTROS ABS: 7.2 10*3/uL (ref 1.7–7.7)
Neutrophils Relative %: 65 %
Platelets: 283 10*3/uL (ref 150–400)
RBC: 4.5 MIL/uL (ref 3.87–5.11)
RDW: 20.3 % — AB (ref 11.5–15.5)
WBC: 11.1 10*3/uL — ABNORMAL HIGH (ref 4.0–10.5)

## 2017-09-21 LAB — COMPREHENSIVE METABOLIC PANEL
ALBUMIN: 3.4 g/dL — AB (ref 3.5–5.0)
ALT: 20 U/L (ref 14–54)
AST: 21 U/L (ref 15–41)
Alkaline Phosphatase: 86 U/L (ref 38–126)
Anion gap: 10 (ref 5–15)
BUN: 53 mg/dL — ABNORMAL HIGH (ref 6–20)
CHLORIDE: 104 mmol/L (ref 101–111)
CO2: 26 mmol/L (ref 22–32)
Calcium: 8.8 mg/dL — ABNORMAL LOW (ref 8.9–10.3)
Creatinine, Ser: 1.3 mg/dL — ABNORMAL HIGH (ref 0.44–1.00)
GFR calc Af Amer: 50 mL/min — ABNORMAL LOW (ref >60)
GFR calc non Af Amer: 43 mL/min — ABNORMAL LOW (ref >60)
GLUCOSE: 116 mg/dL — AB (ref 65–99)
POTASSIUM: 4.1 mmol/L (ref 3.5–5.1)
SODIUM: 140 mmol/L (ref 135–145)
Total Bilirubin: 0.5 mg/dL (ref 0.3–1.2)
Total Protein: 7.9 g/dL (ref 6.5–8.1)

## 2017-09-21 LAB — I-STAT TROPONIN, ED: Troponin i, poc: 0.01 ng/mL (ref 0.00–0.08)

## 2017-09-21 LAB — BRAIN NATRIURETIC PEPTIDE: B NATRIURETIC PEPTIDE 5: 146.2 pg/mL — AB (ref 0.0–100.0)

## 2017-09-21 MED ORDER — ONDANSETRON HCL 4 MG/2ML IJ SOLN
4.0000 mg | Freq: Four times a day (QID) | INTRAMUSCULAR | Status: DC | PRN
  Filled 2017-09-21: qty 2

## 2017-09-21 MED ORDER — HEPARIN SODIUM (PORCINE) 5000 UNIT/ML IJ SOLN
5000.0000 [IU] | Freq: Three times a day (TID) | INTRAMUSCULAR | Status: DC
  Administered 2017-09-22 – 2017-09-24 (×8): 5000 [IU] via SUBCUTANEOUS
  Filled 2017-09-21 (×8): qty 1

## 2017-09-21 MED ORDER — ALBUTEROL SULFATE (2.5 MG/3ML) 0.083% IN NEBU
2.5000 mg | INHALATION_SOLUTION | Freq: Once | RESPIRATORY_TRACT | Status: AC
  Administered 2017-09-21: 2.5 mg via RESPIRATORY_TRACT
  Filled 2017-09-21: qty 3

## 2017-09-21 MED ORDER — ACETAMINOPHEN 325 MG PO TABS
650.0000 mg | ORAL_TABLET | Freq: Four times a day (QID) | ORAL | Status: DC | PRN
  Administered 2017-09-23 (×2): 650 mg via ORAL
  Filled 2017-09-21 (×2): qty 2

## 2017-09-21 MED ORDER — CARVEDILOL 3.125 MG PO TABS
3.1250 mg | ORAL_TABLET | Freq: Two times a day (BID) | ORAL | Status: DC
  Administered 2017-09-22 – 2017-09-23 (×4): 3.125 mg via ORAL
  Filled 2017-09-21 (×6): qty 1

## 2017-09-21 MED ORDER — PANTOPRAZOLE SODIUM 40 MG PO TBEC
40.0000 mg | DELAYED_RELEASE_TABLET | Freq: Every day | ORAL | Status: DC
  Administered 2017-09-22 – 2017-09-24 (×3): 40 mg via ORAL
  Filled 2017-09-21: qty 1
  Filled 2017-09-21: qty 2
  Filled 2017-09-21: qty 1

## 2017-09-21 MED ORDER — ACETAMINOPHEN 650 MG RE SUPP: 650.0000 mg | Freq: Four times a day (QID) | RECTAL | Status: DC | PRN

## 2017-09-21 MED ORDER — POLYETHYLENE GLYCOL 3350 17 G PO PACK: 17.0000 g | PACK | Freq: Every day | ORAL | Status: DC | PRN

## 2017-09-21 MED ORDER — ATORVASTATIN CALCIUM 20 MG PO TABS
20.0000 mg | ORAL_TABLET | Freq: Every day | ORAL | Status: DC
  Administered 2017-09-22 – 2017-09-24 (×3): 20 mg via ORAL
  Filled 2017-09-21 (×3): qty 1

## 2017-09-21 MED ORDER — SERTRALINE HCL 50 MG PO TABS
50.0000 mg | ORAL_TABLET | Freq: Every day | ORAL | Status: DC
  Administered 2017-09-22 – 2017-09-24 (×3): 50 mg via ORAL
  Filled 2017-09-21 (×3): qty 1

## 2017-09-21 MED ORDER — LISINOPRIL 40 MG PO TABS
40.0000 mg | ORAL_TABLET | Freq: Every day | ORAL | Status: DC
  Administered 2017-09-22 – 2017-09-24 (×3): 40 mg via ORAL
  Filled 2017-09-21 (×3): qty 1

## 2017-09-21 MED ORDER — FUROSEMIDE 10 MG/ML IJ SOLN
20.0000 mg | Freq: Two times a day (BID) | INTRAMUSCULAR | Status: DC
  Administered 2017-09-22 (×2): 20 mg via INTRAVENOUS
  Filled 2017-09-21 (×2): qty 2

## 2017-09-21 MED ORDER — METOLAZONE 2.5 MG PO TABS
2.5000 mg | ORAL_TABLET | Freq: Every day | ORAL | Status: DC
  Administered 2017-09-22 – 2017-09-24 (×3): 2.5 mg via ORAL
  Filled 2017-09-21 (×3): qty 1

## 2017-09-21 MED ORDER — GUAIFENESIN ER 600 MG PO TB12
600.0000 mg | ORAL_TABLET | Freq: Two times a day (BID) | ORAL | Status: DC
  Administered 2017-09-22 – 2017-09-24 (×6): 600 mg via ORAL
  Filled 2017-09-21 (×6): qty 1

## 2017-09-21 MED ORDER — SODIUM CHLORIDE 0.9 % IV BOLUS
500.0000 mL | Freq: Once | INTRAVENOUS | Status: AC
  Administered 2017-09-21: 500 mL via INTRAVENOUS

## 2017-09-21 MED ORDER — ONDANSETRON HCL 4 MG PO TABS
4.0000 mg | ORAL_TABLET | Freq: Four times a day (QID) | ORAL | Status: DC | PRN
Start: 2017-09-21 — End: 2017-09-24

## 2017-09-21 MED ORDER — IPRATROPIUM-ALBUTEROL 0.5-2.5 (3) MG/3ML IN SOLN
3.0000 mL | Freq: Four times a day (QID) | RESPIRATORY_TRACT | Status: DC
  Administered 2017-09-22 (×3): 3 mL via RESPIRATORY_TRACT
  Filled 2017-09-21 (×4): qty 3

## 2017-09-21 MED ORDER — CLONIDINE HCL 0.1 MG PO TABS
0.1000 mg | ORAL_TABLET | Freq: Three times a day (TID) | ORAL | Status: DC
  Administered 2017-09-22 – 2017-09-24 (×9): 0.1 mg via ORAL
  Filled 2017-09-21 (×9): qty 1

## 2017-09-21 MED ORDER — ASPIRIN EC 81 MG PO TBEC
81.0000 mg | DELAYED_RELEASE_TABLET | Freq: Every day | ORAL | Status: DC
  Administered 2017-09-22 – 2017-09-24 (×3): 81 mg via ORAL
  Filled 2017-09-21 (×3): qty 1

## 2017-09-21 MED ORDER — DONEPEZIL HCL 10 MG PO TABS
10.0000 mg | ORAL_TABLET | Freq: Every day | ORAL | Status: DC
  Administered 2017-09-22 – 2017-09-23 (×3): 10 mg via ORAL
  Filled 2017-09-21 (×3): qty 1

## 2017-09-21 MED ORDER — RISAQUAD PO CAPS
1.0000 | ORAL_CAPSULE | Freq: Every day | ORAL | Status: DC
  Administered 2017-09-22 – 2017-09-24 (×3): 1 via ORAL
  Filled 2017-09-21 (×3): qty 1

## 2017-09-21 MED ORDER — FUROSEMIDE 10 MG/ML IJ SOLN: 40.0000 mg | Freq: Once | INTRAMUSCULAR | Status: DC

## 2017-09-21 MED ORDER — HYDRALAZINE HCL 50 MG PO TABS
100.0000 mg | ORAL_TABLET | Freq: Three times a day (TID) | ORAL | Status: DC
  Administered 2017-09-22 – 2017-09-24 (×9): 100 mg via ORAL
  Filled 2017-09-21 (×9): qty 2

## 2017-09-21 NOTE — Telephone Encounter (Signed)
Returned call to [place] at [facility]. Unable to leave message.

## 2017-09-21 NOTE — ED Triage Notes (Signed)
Pt here via [facility] for sob x 1 year.  95% RA, RR 16, hr 54, bp 170/palp.  cbg 197.  EKG "irregular".

## 2017-09-21 NOTE — H&P (Signed)
PCP:   Jackie Plum, MD   Chief Complaint: SOB   HPI: this is a 62 y/o morbidly obese female with chronic respiratory failure and chronic diastolic heart failure who was sent to the ER because they were unable to obtain labs. She also reports being SOB especially when they move and turn her.She has a chronic wheeze and cough which is mildly worse. Her creatinine is somewhat elevated. She denies fevers or chills. She is on PO lasix and reports a slightly decrease PO liquid intake. Patient receiving IVF in ER. Daughter at bedside, poor historian.    Review of Systems:  The patient denies anorexia, fever, weight loss,, vision loss, decreased hearing, hoarseness, chest pain, syncope, SOB, peripheral edema, balance deficits, hemoptysis, abdominal pain, melena, hematochezia, severe indigestion/heartburn, hematuria, incontinence, genital sores, muscle weakness, suspicious skin lesions, transient blindness, difficulty walking, depression, unusual weight change, abnormal bleeding, enlarged lymph nodes, angioedema, and breast masses.  Past Medical History: Past Medical History:  Diagnosis Date  . Accelerated hypertension   . Arthritis   . Asthma   . Bilateral chronic knee pain   . Chronic bronchitis (HCC)   . Coronary artery disease    a. remote hx of cath ~2000. B. cath  moderate disease in an RPL branch - too distal for PCI; 3rd RPLB lesion, 50% stenosed; hyperdynamic LV with near cavity obliteration & severely elevated LVEDP  . Dementia   . Diastolic CHF (HCC)   . Diverticular disease of large intestine    diverticular abscess in sigmoid region by CT 2009  . Heart attack (HCC)   . Heart murmur   . Hypertension   . Non-ischemic cardiomyopathy (HCC)    cxr - mild cardiomegly, Echo 10/07/14 - severe LVH with LVEF 65-70%.  . Stroke (HCC)   . Wheelchair bound    Past Surgical History:  Procedure Laterality Date  . ABDOMINAL HYSTERECTOMY    . AMPUTATION  08/21/2011   Procedure:  AMPUTATION DIGIT;  Surgeon: Sharma Covert, MD;  Location: [facility] OR;  Service: Orthopedics;  Laterality: Right;  . CARDIAC CATHETERIZATION N/A 10/09/2014   Procedure: Left Heart Cath and Coronary Angiography;  Surgeon: Marykay Lex, MD;  Location: [facility] INVASIVE CV LAB CUPID;  Service: Cardiovascular;  Laterality: N/A;  . CARDIAC CATHETERIZATION  10/2014   normal coronary arteries aside for a distal RPLB branch with 50% stenosis  . CORONARY STENT PLACEMENT    . FLEXIBLE SIGMOIDOSCOPY  04/28/2012   Procedure: FLEXIBLE SIGMOIDOSCOPY;  Surgeon: Petra Kuba, MD;  Location: [facility] ENDOSCOPY;  Service: Endoscopy;  Laterality: N/A;  unsedated, unprepped  . FLEXIBLE SIGMOIDOSCOPY N/A 12/10/2015   Procedure: FLEXIBLE SIGMOIDOSCOPY;  Surgeon: Bernette Redbird, MD;  Location: [facility] ENDOSCOPY;  Service: Endoscopy;  Laterality: N/A;  . I&D EXTREMITY  08/18/2011   Procedure: IRRIGATION AND DEBRIDEMENT EXTREMITY;  Surgeon: Sharma Covert, MD;  Location: [facility] OR;  Service: Orthopedics;  Laterality: Right;  I&D Right Long Finger    Medications: Prior to Admission medications   Medication Sig Start Date End Date Taking? Authorizing Provider  albuterol (PROVENTIL HFA;VENTOLIN HFA) 108 (90 Base) MCG/ACT inhaler Inhale 2 puffs into the lungs every 6 (six) hours as needed for wheezing or shortness of breath.    [provider]  aspirin EC 81 MG EC tablet Take 1 tablet (81 mg total) by mouth daily. 12/12/15   Johnson, Clanford L, MD  atorvastatin (LIPITOR) 20 MG tablet Take 1 tablet (20 mg total) by mouth daily at 6 PM. 02/17/15  Calvert Cantor, MD  baclofen (LIORESAL) 10 MG tablet Take 10 mg by mouth 3 (three) times daily.  11/07/15   [provider]  carvedilol (COREG) 3.125 MG tablet Take 3.125 mg by mouth 2 (two) times daily with a meal.    [provider]  cloNIDine (CATAPRES) 0.1 MG tablet Take 0.1 mg by mouth 3 (three) times daily.    [provider]  donepezil (ARICEPT) 10 MG tablet Take 10 mg  by mouth at bedtime.    [provider]  FeFum-FePoly-FA-B Cmp-C-Biot (INTEGRA PLUS) CAPS Take 1 capsule by mouth 2 (two) times daily.    [provider]  furosemide (LASIX) 80 MG tablet Take 1 tablet (80 mg total) by mouth daily. Take 80mg  in the morning and 40mg  in the pm 09/15/17   Azalee Course, PA  guaiFENesin (MUCINEX) 600 MG 12 hr tablet Take 600 mg by mouth 2 (two) times daily.    [provider]  hydrALAZINE (APRESOLINE) 100 MG tablet Take 100 mg by mouth 3 (three) times daily.    [provider]  HYDROcodone-acetaminophen (NORCO/VICODIN) 5-325 MG tablet Take 1 tablet by mouth every 6 (six) hours as needed for moderate pain.    [provider]  lisinopril (PRINIVIL,ZESTRIL) 40 MG tablet Take 40 mg by mouth daily.    [provider]  metolazone (ZAROXOLYN) 2.5 MG tablet Take 2.5 mg by mouth daily.    [provider]  omeprazole (PRILOSEC) 40 MG capsule Take 40 mg by mouth daily.    [provider]  Probiotic Product (PROBIOTIC-10 PO) Take 1 capsule by mouth daily.    [provider]  sertraline (ZOLOFT) 50 MG tablet Take 50 mg by mouth daily. Give 1.5 tablet by mouth one time a day     [provider]    Allergies:   Allergies  Allergen Reactions  . Penicillins Itching and Nausea And Vomiting    Has patient had a PCN reaction causing immediate rash, facial/tongue/throat swelling, SOB or lightheadedness with hypotension: No Has patient had a PCN reaction causing severe rash involving mucus membranes or skin necrosis: No Has patient had a PCN reaction that required hospitalization: No Has patient had a PCN reaction occurring within the last 10 years: No      Social History:  reports that she has been smoking cigarettes.  She has a 10.00 pack-year smoking history. She has quit using smokeless tobacco. Her smokeless tobacco use included snuff. She reports that she drinks about 3.6 oz of alcohol per  week. She reports that she does not use drugs.  Family History: Family History  Problem Relation Age of Onset  . Diabetes type I Mother   . Hypertension Mother   . Arthritis Mother   . Stroke Sister   . Diabetes type I Brother   . Hypertension Brother   . Healthy Brother   . Diabetes type II Brother   . Heart disease Brother   . Healthy Brother   . Healthy Brother   . Diabetes type I Sister   . Hypertension Sister   . Asthma Sister   . Bronchitis Sister     Physical Exam: Vitals:   09/21/17 1610  BP: (!) 146/71  Pulse: (!) 51  Resp: (!) 22  Temp: 99.1 F (37.3 C)  TempSrc: Oral  SpO2: 97%    General:  Alert and oriented times three, well developed and nourished, no acute distress Eyes: PERRLA, pink conjunctiva, no scleral icterus ENT: mildly dry  oral mucosa, neck supple, no thyromegaly Lungs: mildly course, difficult exam because of patients body habitus, no wheeze, no crackles, no use of accessory muscles Cardiovascular: regular rate and rhythm, no regurgitation, no gallops, no murmurs. No carotid bruits, no JVD Abdomen: soft, positive BS, non-tender, non-distended, no organomegaly, not an acute abdomen GU: not examined Neuro: CN II - XII grossly intact, sensation intact Musculoskeletal: strength 5/5 all extremities, no clubbing, cyanosis or edema Skin: no rash, no subcutaneous crepitation, no decubitus Psych: appropriate patient   Labs on Admission:  Recent Labs    09/21/17 1734  NA 140  K 4.1  CL 104  CO2 26  GLUCOSE 116*  BUN 53*  CREATININE 1.30*  CALCIUM 8.8*   Recent Labs    09/21/17 1734  AST 21  ALT 20  ALKPHOS 86  BILITOT 0.5  PROT 7.9  ALBUMIN 3.4*   No results for input(s): LIPASE, AMYLASE in the last 72 hours. Recent Labs    09/21/17 1734  WBC 11.1*  NEUTROABS 7.2  HGB 9.3*  HCT 31.2*  MCV 69.3*  PLT 283   No results for input(s): CKTOTAL, CKMB, CKMBINDEX, TROPONINI in the last 72 hours. Invalid input(s): POCBNP No  results for input(s): DDIMER in the last 72 hours. No results for input(s): HGBA1C in the last 72 hours. No results for input(s): CHOL, HDL, LDLCALC, TRIG, CHOLHDL, LDLDIRECT in the last 72 hours. No results for input(s): TSH, T4TOTAL, T3FREE, THYROIDAB in the last 72 hours.  Invalid input(s): FREET3 No results for input(s): VITAMINB12, FOLATE, FERRITIN, TIBC, IRON, RETICCTPCT in the last 72 hours.  Micro Results: No results found for this or any previous visit (from the past 240 hour(s)).   Radiological Exams on Admission: Dg Chest 2 View  Result Date: 09/21/2017 CLINICAL DATA:  Dyspnea EXAM: CHEST - 2 VIEW COMPARISON:  04/01/2017 chest radiograph. FINDINGS: Stable cardiomediastinal silhouette with mild cardiomegaly. No pneumothorax. No pleural effusion. Borderline mild pulmonary edema. IMPRESSION: Borderline mild congestive heart failure. Electronically Signed   By: Delbert Phenix M.D.   On: 09/21/2017 18:28    Assessment/Plan Present on Admission: Acute on chronic diastolic heart failure -bring in for 23hour observation -IV lasix, I/O's, daily weights -oxygen -duonebs scheduled  Acute kidney injury -d/t decreased PO intake and PO lasix and zaroxylyn -d/c IVF, low doser IV lasix -BMP in AM  . CAD (coronary artery disease) -see above  . Chronic respiratory failure- on home O2 -continue oxygen  . COPD (chronic obstructive pulmonary disease) (HCC) -stable, duonebs  . Stroke [facility]) -bedbound female  . Morbid obesity (HCC) -no CPAP at night  . Moderate dementia with behavioral disturbance -aware, home meds resumed  Lashawnda Hancox 09/21/2017, 7:25 PM

## 2017-09-21 NOTE — Telephone Encounter (Signed)
[name] calling with [facility] would like office to fax order for Echocardiogram to [facility] at [phone] or [phone]. [name] states that transportation charges a lot to transport patients, so she will arrange for echo to be completed at [facility].

## 2017-09-21 NOTE — ED Provider Notes (Signed)
[facility] EMERGENCY DEPARTMENT Provider Note   CSN: [id number] Arrival date & time: 09/21/17  1555     History   Chief Complaint Chief Complaint  Patient presents with  . Shortness of Breath    HPI [name] is a 62 y.o. female.   The history is provided by the patient.   Shortness of Breath   This is a recurrent problem. The problem occurs intermittently.The current episode started 3 to 5 hours ago. The problem has been gradually improving. Associated symptoms include orthopnea and leg swelling. Pertinent negatives include no fever, no sore throat, no ear pain, no cough, no sputum production, no chest pain, no vomiting, no abdominal pain and no rash. It is unknown what precipitated the problem. She has tried nothing for the symptoms. The treatment provided no relief. Associated medical issues include asthma, COPD, CAD and heart failure. Associated medical issues do not include pneumonia or DVT.    Past Medical History:  Diagnosis Date  . Accelerated hypertension   . Arthritis   . Asthma   . Bilateral chronic knee pain   . Chronic bronchitis (HCC)   . Coronary artery disease    a. remote hx of cath ~2000. B. cath  moderate disease in an RPL branch - too distal for PCI; 3rd RPLB lesion, 50% stenosed; hyperdynamic LV with near cavity obliteration & severely elevated LVEDP  . Dementia   . Diastolic CHF (HCC)   . Diverticular disease of large intestine    diverticular abscess in sigmoid region by CT 2009  . Heart attack (HCC)   . Heart murmur   . Hypertension   . Non-ischemic cardiomyopathy (HCC)    cxr - mild cardiomegly, Echo 10/07/14 - severe LVH with LVEF 65-70%.  . Stroke (HCC)   . Wheelchair bound     Patient Active Problem List   Diagnosis Date Noted  . COPD (chronic obstructive pulmonary disease) (HCC) 06/01/2016  . Depression 04/11/2016  . 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 Chronic pain 12/01/2015  . Chronic diastolic heart failure  (HCC) 78/29/5621  . Moderate dementia with behavioral disturbance 08/01/2015  . Chronic respiratory failure- on home O2 02/17/2015  . Hypertensive heart disease 11/08/2014  . HLD (hyperlipidemia) 11/08/2014  . Diverticular disease of large intestine   . Stroke (HCC)   . CAD (coronary artery disease)   . Hypokalemia 04/28/2012  . Morbid obesity (HCC) 03/26/2006  . GERD 03/26/2006  . Osteoarthritis 03/26/2006    Past Surgical History:  Procedure Laterality Date  . ABDOMINAL HYSTERECTOMY    . AMPUTATION  08/21/2011   Procedure: AMPUTATION DIGIT;  Surgeon: Sharma Covert, MD;  Location: [facility] OR;  Service: Orthopedics;  Laterality: Right;  . CARDIAC CATHETERIZATION N/A 10/09/2014   Procedure: Left Heart Cath and Coronary Angiography;  Surgeon: Marykay Lex, MD;  Location: [facility] INVASIVE CV LAB CUPID;  Service: Cardiovascular;  Laterality: N/A;  . CARDIAC CATHETERIZATION  10/2014   normal coronary arteries aside for a distal RPLB branch with 50% stenosis  . CORONARY STENT PLACEMENT    . FLEXIBLE SIGMOIDOSCOPY  04/28/2012   Procedure: FLEXIBLE SIGMOIDOSCOPY;  Surgeon: Petra Kuba, MD;  Location: [facility] ENDOSCOPY;  Service: Endoscopy;  Laterality: N/A;  unsedated, unprepped  . FLEXIBLE SIGMOIDOSCOPY N/A 12/10/2015   Procedure: FLEXIBLE SIGMOIDOSCOPY;  Surgeon: Bernette Redbird, MD;  Location: [facility] ENDOSCOPY;  Service: Endoscopy;  Laterality: N/A;  . I&D EXTREMITY  08/18/2011   Procedure: IRRIGATION AND DEBRIDEMENT EXTREMITY;  Surgeon: Sharma Covert,  MD;  Location: [facility] OR;  Service: Orthopedics;  Laterality: Right;  I&D Right Long Finger     OB History    Gravida  0   Para  0   Term  0   Preterm  0   AB  0   Living        SAB  0   TAB  0   Ectopic  0   Multiple      Live Births               Home Medications    Prior to Admission medications   Medication Sig Start Date End Date Taking? Authorizing Provider  albuterol (PROVENTIL HFA;VENTOLIN HFA) 108 (90 Base) MCG/ACT inhaler  Inhale 2 puffs into the lungs every 6 (six) hours as needed for wheezing or shortness of breath.    [provider]  aspirin EC 81 MG EC tablet Take 1 tablet (81 mg total) by mouth daily. 12/12/15   Johnson, Clanford L, MD  atorvastatin (LIPITOR) 20 MG tablet Take 1 tablet (20 mg total) by mouth daily at 6 PM. 02/17/15   Calvert Cantor, MD  baclofen (LIORESAL) 10 MG tablet Take 10 mg by mouth 3 (three) times daily.  11/07/15   [provider]  carvedilol (COREG) 3.125 MG tablet Take 3.125 mg by mouth 2 (two) times daily with a meal.    [provider]  cloNIDine (CATAPRES) 0.1 MG tablet Take 0.1 mg by mouth 3 (three) times daily.    [provider]  donepezil (ARICEPT) 10 MG tablet Take 10 mg by mouth at bedtime.    [provider]  FeFum-FePoly-FA-B Cmp-C-Biot (INTEGRA PLUS) CAPS Take 1 capsule by mouth 2 (two) times daily.    [provider]  furosemide (LASIX) 80 MG tablet Take 1 tablet (80 mg total) by mouth daily. Take 80mg  in the morning and 40mg  in the pm 09/15/17   Azalee Course, PA  guaiFENesin (MUCINEX) 600 MG 12 hr tablet Take 600 mg by mouth 2 (two) times daily.    [provider]  hydrALAZINE (APRESOLINE) 100 MG tablet Take 100 mg by mouth 3 (three) times daily.    [provider]  HYDROcodone-acetaminophen (NORCO/VICODIN) 5-325 MG tablet Take 1 tablet by mouth every 6 (six) hours as needed for moderate pain.    [provider]  lisinopril (PRINIVIL,ZESTRIL) 40 MG tablet Take 40 mg by mouth daily.    [provider]  metolazone (ZAROXOLYN) 2.5 MG tablet Take 2.5 mg by mouth daily.    [provider]  omeprazole (PRILOSEC) 40 MG capsule Take 40 mg by mouth daily.    [provider]  Probiotic Product (PROBIOTIC-10 PO) Take 1 capsule by mouth daily.    [provider]  sertraline (ZOLOFT) 50 MG tablet Take 50 mg by mouth daily. Give 1.5 tablet by mouth one time a day     [provider]    Family History Family History  Problem Relation Age of Onset  . Diabetes type I Mother   . Hypertension Mother   . Arthritis Mother   . Stroke Sister   . Diabetes type I Brother   . Hypertension Brother   . Healthy Brother   . Diabetes type II Brother   . Heart disease Brother   . Healthy Brother   . Healthy Brother   . Diabetes type I Sister   . Hypertension Sister   . Asthma Sister   . Bronchitis  Sister     Social History Social History   Tobacco Use  . Smoking status: Current Every Day Smoker    Packs/day: 0.50    Years: 20.00    Pack years: 10.00    Types: Cigarettes  . Smokeless tobacco: Former Neurosurgeon    Types: Snuff  Substance Use Topics  . Alcohol use: Yes    Alcohol/week: 3.6 oz    Types: 6 Cans of beer per week    Comment: 04/27/12- "amount varies"  . Drug use: No     Allergies   Penicillins   Review of Systems Review of Systems  Constitutional: Negative for chills and fever.  HENT: Negative for ear pain and sore throat.   Eyes: Negative for pain and visual disturbance.  Respiratory: Positive for shortness of breath. Negative for cough and sputum production.   Cardiovascular: Positive for orthopnea and leg swelling. Negative for chest pain and palpitations.  Gastrointestinal: Negative for abdominal pain and vomiting.  Genitourinary: Negative for dysuria and hematuria.  Musculoskeletal: Positive for gait problem. Negative for arthralgias and back pain.  Skin: Negative for color change and rash.  Neurological: Negative for seizures and syncope.  All other systems reviewed and are negative.    Physical Exam Updated Vital Signs ED Triage Vitals [09/21/17 1610]  Enc Vitals Group     BP (!) 146/71     Pulse Rate (!) 51     Resp (!) 22     Temp 99.1 F (37.3 C)     Temp Source Oral     SpO2 97 %     Weight      Height      Head Circumference      Peak Flow      Pain Score      Pain Loc      Pain Edu?      Excl. in GC?      Physical Exam  Constitutional: She appears well-developed and well-nourished. No distress.  HENT:  Head: Normocephalic and atraumatic.  Dry mucous membranes  Eyes: Pupils are equal, round, and reactive to light. Conjunctivae and EOM are normal.  Neck: Normal range of motion. Neck supple.  Cardiovascular: Normal rate and regular rhythm.  No murmur heard. Pulmonary/Chest: Tachypnea noted. No respiratory distress. She has decreased breath sounds. She exhibits edema (pitting b/l).  Diminished throughout, poor effort, likely secondary to habitus  Abdominal: Soft. There is no tenderness.  Musculoskeletal: Normal range of motion. She exhibits no edema.  Neurological: She is alert.  Skin: Skin is warm and dry.  Psychiatric: She has a normal mood and affect.  Nursing note and vitals reviewed.    ED Treatments / Results  Labs (all labs ordered are listed, but only abnormal results are displayed) Labs Reviewed  COMPREHENSIVE METABOLIC PANEL - Abnormal; Notable for the following components:      Result Value   Glucose, Bld 116 (*)    BUN 53 (*)    Creatinine, Ser 1.30 (*)    Calcium 8.8 (*)    Albumin 3.4 (*)    GFR calc non Af Amer 43 (*)    GFR calc Af Amer 50 (*)    All other components within normal limits  CBC WITH DIFFERENTIAL/PLATELET - Abnormal; Notable for the following components:   WBC 11.1 (*)    Hemoglobin 9.3 (*)    HCT 31.2 (*)    MCV 69.3 (*)    MCH 20.7 (*)    MCHC 29.8 (*)  RDW 20.3 (*)    All other components within normal limits  BRAIN NATRIURETIC PEPTIDE - Abnormal; Notable for the following components:   B Natriuretic Peptide 146.2 (*)    All other components within normal limits  I-STAT TROPONIN, ED    EKG EKG Interpretation  Date/Time:  Monday September 21 2017 17:45:08 EDT Ventricular Rate:  54 PR Interval:    QRS Duration: 138 QT Interval:  462 QTC Calculation: 438 R Axis:   60 Text Interpretation:  Sinus rhythm Nonspecific intraventricular  conduction delay Borderline repolarization abnormality Minimal ST elevation, lateral leads Baseline wander in lead(s) V6 No significant change since last tracing Confirmed by Richardean Canal [phone]) on 09/21/2017 6:45:01 PM   Radiology Dg Chest 2 View  Result Date: 09/21/2017 CLINICAL DATA:  Dyspnea EXAM: CHEST - 2 VIEW COMPARISON:  04/01/2017 chest radiograph. FINDINGS: Stable cardiomediastinal silhouette with mild cardiomegaly. No pneumothorax. No pleural effusion. Borderline mild pulmonary edema. IMPRESSION: Borderline mild congestive heart failure. Electronically Signed   By: Delbert Phenix M.D.   On: 09/21/2017 18:28    Procedures Procedures (including critical care time)  Medications Ordered in ED Medications  sodium chloride 0.9 % bolus 500 mL (has no administration in time range)  albuterol (PROVENTIL) (2.5 MG/3ML) 0.083% nebulizer solution 2.5 mg (2.5 mg Nebulization Given 09/21/17 1804)     Initial Impression / Assessment and Plan / ED Course  I have reviewed the triage vital signs and the nursing notes.  Pertinent labs & imaging results that were available during my care of the patient were reviewed by me and considered in my medical decision making (see chart for details).     [name] is a 62 year old female history of stroke wheelchair-bound, asthma, heart failure, hypertension, CAD, dementia who presents to the ED from nursing home with shortness of breath.  Patient with overall unremarkable vitals upon arrival.  No fever.  Patient states that she felt short of breath today while she was being moved by nursing home staff.  She states that it is hard to breathe when she lies flat.  She denies wearing any BiPAP or CPAP.  Patient states that she has asthma but not using any breathing treatments.  Patient denies any history of PE or DVT.  She is wheelchair-bound.  Patient feels better while sitting up currently.  She does take diuretics has history of heart failure.  She denies  any chest pain.  She denies any cough, sputum production, fever.  Patient is morbidly obese and overall difficult to listen to breath sounds but appeared diminished with poor effort.  Patient does have some swelling peripherally but no obvious JVD.  She does have some tachypnea on exam but overall is well-appearing.  EKG shows sinus bradycardia with no signs of ischemic changes.  Unchanged from prior EKG.  Will obtain chest x-ray, basic labs including troponin and BNP.  Will give patient albuterol treatment and re-evaluate.  Possibly mild CHF versus asthma exacerbation.  Doubt PE or DVT at this time given no tachycardia, no hypoxia and H and P.  Patient with unremarkable troponin.  No chest pain, doubt ACS.  Patient with elevated creatinine and BUN suggestive of likely prerenal kidney failure.  Has dry mucous membranes on exam and clinically appears dehydrated.  However chest x-ray shows concern for pulmonary edema as well but patient not hypoxic and breathing comfortably on exam.  She does have some peripheral leg edema as well but likely multifactorial as patient does have some heart  failure and is also bedbound. Patient otherwise had no significant anemia, electrolyte abnormality.  Given concern for possible clinical dehydration will give 500 cc of fluid.  Will contact medicine for admission. Will need new echo. Cr could be elevated due to diuretic use and ACEI use as well.   Final Clinical Impressions(s) / ED Diagnoses   Final diagnoses:  AKI (acute kidney injury) (HCC)  Dyspnea, unspecified type    ED Discharge Orders    None      Virgina Norfolk, DO 09/21/17 1912  Charlynne Pander, MD 09/24/17 2013

## 2017-09-22 DIAGNOSIS — F0391 Unspecified dementia with behavioral disturbance: Secondary | ICD-10-CM | POA: Diagnosis not present

## 2017-09-22 DIAGNOSIS — R0602 Shortness of breath: Secondary | ICD-10-CM | POA: Diagnosis present

## 2017-09-22 DIAGNOSIS — F329 Major depressive disorder, single episode, unspecified: Secondary | ICD-10-CM | POA: Diagnosis present

## 2017-09-22 DIAGNOSIS — I252 Old myocardial infarction: Secondary | ICD-10-CM | POA: Diagnosis not present

## 2017-09-22 DIAGNOSIS — Z7401 Bed confinement status: Secondary | ICD-10-CM | POA: Diagnosis not present

## 2017-09-22 DIAGNOSIS — Z5320 Procedure and treatment not carried out because of patient's decision for unspecified reasons: Secondary | ICD-10-CM | POA: Diagnosis present

## 2017-09-22 DIAGNOSIS — E785 Hyperlipidemia, unspecified: Secondary | ICD-10-CM | POA: Diagnosis present

## 2017-09-22 DIAGNOSIS — J449 Chronic obstructive pulmonary disease, unspecified: Secondary | ICD-10-CM | POA: Diagnosis present

## 2017-09-22 DIAGNOSIS — J9610 Chronic respiratory failure, unspecified whether with hypoxia or hypercapnia: Secondary | ICD-10-CM | POA: Diagnosis present

## 2017-09-22 DIAGNOSIS — F0281 Dementia in other diseases classified elsewhere with behavioral disturbance: Secondary | ICD-10-CM | POA: Diagnosis present

## 2017-09-22 DIAGNOSIS — G3183 Dementia with Lewy bodies: Secondary | ICD-10-CM | POA: Diagnosis present

## 2017-09-22 DIAGNOSIS — N179 Acute kidney failure, unspecified: Secondary | ICD-10-CM | POA: Diagnosis not present

## 2017-09-22 DIAGNOSIS — M1990 Unspecified osteoarthritis, unspecified site: Secondary | ICD-10-CM | POA: Diagnosis present

## 2017-09-22 DIAGNOSIS — R011 Cardiac murmur, unspecified: Secondary | ICD-10-CM | POA: Diagnosis present

## 2017-09-22 DIAGNOSIS — Z90710 Acquired absence of both cervix and uterus: Secondary | ICD-10-CM | POA: Diagnosis not present

## 2017-09-22 DIAGNOSIS — D649 Anemia, unspecified: Secondary | ICD-10-CM | POA: Diagnosis present

## 2017-09-22 DIAGNOSIS — Z880 Allergy status to penicillin: Secondary | ICD-10-CM | POA: Diagnosis not present

## 2017-09-22 DIAGNOSIS — Z6841 Body Mass Index (BMI) 40.0 and over, adult: Secondary | ICD-10-CM | POA: Diagnosis not present

## 2017-09-22 DIAGNOSIS — Z9981 Dependence on supplemental oxygen: Secondary | ICD-10-CM | POA: Diagnosis not present

## 2017-09-22 DIAGNOSIS — R0600 Dyspnea, unspecified: Secondary | ICD-10-CM | POA: Diagnosis not present

## 2017-09-22 DIAGNOSIS — I428 Other cardiomyopathies: Secondary | ICD-10-CM | POA: Diagnosis present

## 2017-09-22 DIAGNOSIS — I5033 Acute on chronic diastolic (congestive) heart failure: Secondary | ICD-10-CM | POA: Diagnosis present

## 2017-09-22 DIAGNOSIS — I110 Hypertensive heart disease with heart failure: Secondary | ICD-10-CM | POA: Diagnosis present

## 2017-09-22 DIAGNOSIS — I2510 Atherosclerotic heart disease of native coronary artery without angina pectoris: Secondary | ICD-10-CM | POA: Diagnosis present

## 2017-09-22 DIAGNOSIS — E860 Dehydration: Secondary | ICD-10-CM | POA: Diagnosis present

## 2017-09-22 DIAGNOSIS — I25118 Atherosclerotic heart disease of native coronary artery with other forms of angina pectoris: Secondary | ICD-10-CM | POA: Diagnosis not present

## 2017-09-22 LAB — BASIC METABOLIC PANEL
ANION GAP: 9 (ref 5–15)
BUN: 48 mg/dL — ABNORMAL HIGH (ref 6–20)
CO2: 27 mmol/L (ref 22–32)
Calcium: 8.8 mg/dL — ABNORMAL LOW (ref 8.9–10.3)
Chloride: 103 mmol/L (ref 101–111)
Creatinine, Ser: 1.04 mg/dL — ABNORMAL HIGH (ref 0.44–1.00)
GFR, EST NON AFRICAN AMERICAN: 56 mL/min — AB (ref >60)
Glucose, Bld: 110 mg/dL — ABNORMAL HIGH (ref 65–99)
POTASSIUM: 3.7 mmol/L (ref 3.5–5.1)
SODIUM: 139 mmol/L (ref 135–145)

## 2017-09-22 LAB — HIV ANTIBODY (ROUTINE TESTING W REFLEX): HIV SCREEN 4TH GENERATION: NONREACTIVE

## 2017-09-22 LAB — BRAIN NATRIURETIC PEPTIDE: B NATRIURETIC PEPTIDE 5: 97.7 pg/mL (ref 0.0–100.0)

## 2017-09-22 LAB — CBC
HEMATOCRIT: 28.2 % — AB (ref 36.0–46.0)
Hemoglobin: 8.5 g/dL — ABNORMAL LOW (ref 12.0–15.0)
MCH: 20.9 pg — ABNORMAL LOW (ref 26.0–34.0)
MCHC: 30.1 g/dL (ref 30.0–36.0)
MCV: 69.3 fL — ABNORMAL LOW (ref 78.0–100.0)
Platelets: 261 10*3/uL (ref 150–400)
RBC: 4.07 MIL/uL (ref 3.87–5.11)
RDW: 20.1 % — ABNORMAL HIGH (ref 11.5–15.5)
WBC: 12.6 10*3/uL — AB (ref 4.0–10.5)

## 2017-09-22 MED ORDER — FUROSEMIDE 10 MG/ML IJ SOLN
40.0000 mg | Freq: Two times a day (BID) | INTRAMUSCULAR | Status: AC
  Administered 2017-09-22 – 2017-09-23 (×3): 40 mg via INTRAVENOUS
  Filled 2017-09-22 (×3): qty 4

## 2017-09-22 MED ORDER — IPRATROPIUM-ALBUTEROL 0.5-2.5 (3) MG/3ML IN SOLN: 3.0000 mL | Freq: Four times a day (QID) | RESPIRATORY_TRACT | Status: DC | PRN

## 2017-09-22 NOTE — [place] FL2 (Signed)
[place] MEDICAID FL2 LEVEL OF CARE SCREENING TOOL     IDENTIFICATION  Patient Name: [name] Birthdate: 05/16/1956 Sex: female Admission Date (Current Location): 09/21/2017  [facility] and [place] Number:  Producer, television/film/video and Address:  The . [facility], [address], [place], [place] [address]      Provider Number: [id number]  Attending Physician Name and Address:  Joseph Art, DO  Relative Name and Phone Number:       Current Level of Care: Hospital Recommended Level of Care: Skilled Nursing Facility Prior Approval Number:    Date Approved/Denied:   PASRR Number: [id number]  Discharge Plan: SNF    Current Diagnoses: Patient Active Problem List   Diagnosis Date Noted  . SOB (shortness of breath)   . CHF (congestive heart failure) (HCC) 09/21/2017  . COPD (chronic obstructive pulmonary disease) (HCC) 06/01/2016  . Depression 04/11/2016  . 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 Chronic pain 12/01/2015  . Chronic diastolic heart failure (HCC) 12/01/2015  . Moderate dementia with behavioral disturbance 08/01/2015  . AKI (acute kidney injury) (HCC) 02/18/2015  . Chronic respiratory failure- on home O2 02/17/2015  . Hypertensive heart disease 11/08/2014  . HLD (hyperlipidemia) 11/08/2014  . Diverticular disease of large intestine   . Stroke (HCC)   . CAD (coronary artery disease)   . Hypokalemia 04/28/2012  . Morbid obesity (HCC) 03/26/2006  . GERD 03/26/2006  . Osteoarthritis 03/26/2006    Orientation RESPIRATION BLADDER Height & Weight     Self, Situation, Place  Normal Incontinent Weight: (!) 304 lb (137.9 kg) Height:     BEHAVIORAL SYMPTOMS/MOOD NEUROLOGICAL BOWEL NUTRITION STATUS  (None) (Stroke, Moderate Dementia) Incontinent Diet(Heart healthy. Fluid restriction 1500 mL.)  AMBULATORY STATUS COMMUNICATION OF NEEDS Skin     Verbally Other (Comment)(Amputation, cracking, skin tear.)                       Personal  Care Assistance Level of Assistance              Functional Limitations Info  Sight, Hearing, Speech Sight Info: Adequate Hearing Info: Adequate Speech Info: Adequate    SPECIAL CARE FACTORS FREQUENCY                       Contractures Contractures Info: Not present    Additional Factors Info  Code Status, Allergies, Psychotropic Code Status Info: Full Allergies Info: Penicillins Psychotropic Info: Depression: Zoloft 50 mg PO daily.         Current Medications (09/22/2017):  This is the current hospital active medication list Current Facility-Administered Medications  Medication Dose Route Frequency Provider Last Rate Last Dose  . acetaminophen (TYLENOL) tablet 650 mg  650 mg Oral Q6H PRN Crosley, Debby, MD       Or  . acetaminophen (TYLENOL) suppository 650 mg  650 mg Rectal Q6H PRN Crosley, Debby, MD      . acidophilus (RISAQUAD) capsule 1 capsule  1 capsule Oral Daily Crosley, Debby, MD   1 capsule at 09/22/17 1017  . aspirin EC tablet 81 mg  81 mg Oral Daily Crosley, Debby, MD   81 mg at 09/22/17 1019  . atorvastatin (LIPITOR) tablet 20 mg  20 mg Oral q1800 Crosley, Debby, MD      . carvedilol (COREG) tablet 3.125 mg  3.125 mg Oral BID WC Crosley, Debby, MD   3.125 mg at 09/22/17 0601  . cloNIDine (CATAPRES) tablet 0.1  mg  0.1 mg Oral TID Gery Pray, MD   0.1 mg at 09/22/17 1016  . donepezil (ARICEPT) tablet 10 mg  10 mg Oral QHS Crosley, Debby, MD   10 mg at 09/22/17 0025  . furosemide (LASIX) injection 40 mg  40 mg Intravenous BID Vann, Jessica U, DO      . guaiFENesin (MUCINEX) 12 hr tablet 600 mg  600 mg Oral BID Crosley, Debby, MD   600 mg at 09/22/17 1017  . heparin injection 5,000 Units  5,000 Units Subcutaneous Q8H Crosley, Debby, MD   5,000 Units at 09/22/17 0600  . hydrALAZINE (APRESOLINE) tablet 100 mg  100 mg Oral TID Gery Pray, MD   100 mg at 09/22/17 1018  . ipratropium-albuterol (DUONEB) 0.5-2.5 (3) MG/3ML nebulizer solution 3 mL  3 mL  Nebulization Q6H Crosley, Debby, MD   3 mL at 09/22/17 0906  . lisinopril (PRINIVIL,ZESTRIL) tablet 40 mg  40 mg Oral Daily Crosley, Debby, MD   40 mg at 09/22/17 1016  . metolazone (ZAROXOLYN) tablet 2.5 mg  2.5 mg Oral Daily Crosley, Debby, MD   2.5 mg at 09/22/17 1016  . ondansetron (ZOFRAN) tablet 4 mg  4 mg Oral Q6H PRN Crosley, Debby, MD       Or  . ondansetron (ZOFRAN) injection 4 mg  4 mg Intravenous Q6H PRN Crosley, Debby, MD      . pantoprazole (PROTONIX) EC tablet 40 mg  40 mg Oral Daily Crosley, Debby, MD   40 mg at 09/22/17 1017  . polyethylene glycol (MIRALAX / GLYCOLAX) packet 17 g  17 g Oral Daily PRN Crosley, Debby, MD      . sertraline (ZOLOFT) tablet 50 mg  50 mg Oral Daily Crosley, Debby, MD   50 mg at 09/22/17 1016     Discharge Medications: Please see discharge summary for a list of discharge medications.  Relevant Imaging Results:  Relevant Lab Results:   Additional Information SS#: [id number]  Margarito Liner, LCSW

## 2017-09-22 NOTE — Plan of Care (Signed)
  Problem: Education: Goal: Ability to verbalize understanding of medication therapies will improve Outcome: Progressing   Problem: Education: Goal: Ability to demonstrate management of disease process will improve Outcome: Progressing   

## 2017-09-22 NOTE — Clinical Social Work Note (Signed)
Clinical Social Work Assessment  Patient Details  Name: [name] MRN: [id number] Date of Birth: Oct 15, 1955  Date of referral:  09/22/17               Reason for consult:  Discharge Planning                Permission sought to share information with:  Facility Sport and exercise psychologist, Family Supports Permission granted to share information::  Yes, Verbal Permission Granted  Name::        Agency::  [facility]  Relationship::  Daughter at bedside  Contact Information:     Housing/Transportation Living arrangements for the past 2 months:  [place] of Information:  Medical Team, Adult Children, Facility Patient Interpreter Needed:  None Criminal Activity/Legal Involvement Pertinent to Current Situation/Hospitalization:  No - Comment as needed Significant Relationships:  Adult Children Lives with:  Facility Resident Do you feel safe going back to the place where you live?  Yes Need for family participation in patient care:  Yes (Comment)  Care giving concerns:  Patient is a long-term resident from [facility].   Social Worker assessment / plan:  CSW met with patient. Daughter at bedside. Patient asleep but per RN report, patient is not fully oriented.  CSW introduced role and explained that discharge planning would be discussed. Patient's daughter confirmed she was admitted from [place] and the plan is for her to return at discharge. She has been there over a year. No further concerns. CSW encouraged patient's daughter to contact CSW as needed. CSW will continue to follow patient and her daughter for support and facilitate discharge back to SNF once medically stable.  Employment status:  Unemployed Forensic scientist:  Medicaid In [place] PT Recommendations:  Not assessed at this time Information / Referral to community resources:  [place]  Patient/Family's Response to care:  Patient asleep and not fully oriented. Patient's  daughter agreeable to return to SNF. Patient's children supportive and involved in patient's care. Patient's daughter appreciated social work intervention.  Patient/Family's Understanding of and Emotional Response to Diagnosis, Current Treatment, and Prognosis:  Patient asleep and not fully oriented. Patient's daughter has a good understanding of the reason for admission and plan to return to SNF at discharge. Patient's daughter appears happy with hospital care.  Emotional Assessment Appearance:  Appears stated age Attitude/Demeanor/Rapport:  Unable to Assess Affect (typically observed):  Unable to Assess Orientation:  Oriented to Self, Oriented to Place, Oriented to Situation Alcohol / Substance use:  Never Used Psych involvement (Current and /or in the community):  No (Comment)  Discharge Needs  Concerns to be addressed:  Care Coordination Readmission within the last 30 days:  No Current discharge risk:  Cognitively Impaired Barriers to Discharge:  Continued Medical Work up   Candie Chroman, LCSW 09/22/2017, 12:32 PM

## 2017-09-22 NOTE — Progress Notes (Signed)
PROGRESS NOTE    [name]  ZOX:[id number] DOB: 1955/08/27 DOA: 09/21/2017 PCP: Jackie Plum, MD   Outpatient Specialists:     Brief Narrative:  62 y/o morbidly obese female with chronic respiratory failure and chronic diastolic heart failure who was sent to the ER because they were unable to obtain labs. She also reports being SOB especially when they move and turn her.She has a chronic wheeze and cough which is mildly worse. Her creatinine is somewhat elevated. She denies fevers or chills. She is on PO lasix and reports a slightly decrease PO liquid intake. Patient receiving IVF in ER.     Assessment & Plan:   Active Problems:   Morbid obesity (HCC)   CAD (coronary artery disease)   Stroke (HCC)   HLD (hyperlipidemia)   Chronic respiratory failure- on home O2   AKI (acute kidney injury) (HCC)   Moderate dementia with behavioral disturbance   Chronic diastolic heart failure (HCC)   Depression   COPD (chronic obstructive pulmonary disease) (HCC)   CHF (congestive heart failure) (HCC)   SOB (shortness of breath)  SOB -multifactorial -obesity/CHF/deconditioning/anemia  Acute on chronic diastolic heart failure -IV lasix, I/O's, daily weights -oxygen -duonebs scheduled -difficult to assess volume status due to obesity  Anemia Check Fe panel  Acute kidney injury -improved with diuresis  CAD (coronary artery disease) previous cardiac catheterization showed nonobstructive disease in the distal RPLB, otherwise clean coronary artery  Chronic respiratory failure- on home O2 -continue oxygen  COPD (chronic obstructive pulmonary disease) (HCC) -stable, duonebs  Stroke [facility]) -bedbound female  Morbid obesity (HCC) Body mass index is 59.37 kg/m.  Moderate dementia with behavioral disturbance -aware, home meds resumed -from SNF     DVT prophylaxis:  SQ Heparin  Code Status: Full Code   Family Communication:   Disposition Plan:    From SNF   Consultants:      Subjective: "I'm just hot"  Objective: Vitals:   09/22/17 0049 09/22/17 0418 09/22/17 0906 09/22/17 1015  BP:  (!) 125/56  (!) 157/51  Pulse:  (!) 56  62  Resp:  20    Temp:  98.4 F (36.9 C)  98.2 F (36.8 C)  TempSrc:  Oral  Oral  SpO2: 100% 93% 98% 98%  Weight:  (!) 137.9 kg (304 lb)      Intake/Output Summary (Last 24 hours) at 09/22/2017 1157 Last data filed at 09/22/2017 1043 Gross per 24 hour  Intake 240 ml  Output 900 ml  Net -660 ml   Filed Weights   09/21/17 2107 09/22/17 0418  Weight: (!) 138.8 kg (306 lb) (!) 137.9 kg (304 lb)    Examination:  General exam: obese female, NAD Respiratory system: diminished Cardiovascular system: rrr Gastrointestinal system: +Bs, soft Central nervous system: Awake-- poor historian-- repeating I'm just hot Skin: No pitting edema but legs large Psychiatry: impaired judgement    Data Reviewed: I have personally reviewed following labs and imaging studies  CBC: Recent Labs  Lab 09/21/17 1734 09/22/17 0716  WBC 11.1* 12.6*  NEUTROABS 7.2  --   HGB 9.3* 8.5*  HCT 31.2* 28.2*  MCV 69.3* 69.3*  PLT 283 261   Basic Metabolic Panel: Recent Labs  Lab 09/21/17 1734 09/22/17 0716  NA 140 139  K 4.1 3.7  CL 104 103  CO2 26 27  GLUCOSE 116* 110*  BUN 53* 48*  CREATININE 1.30* 1.04*  CALCIUM 8.8* 8.8*   GFR: Estimated Creatinine Clearance: 72.1 mL/min (A) (by C-G  formula based on SCr of 1.04 mg/dL (H)). Liver Function Tests: Recent Labs  Lab 09/21/17 1734  AST 21  ALT 20  ALKPHOS 86  BILITOT 0.5  PROT 7.9  ALBUMIN 3.4*   No results for input(s): LIPASE, AMYLASE in the last 168 hours. No results for input(s): AMMONIA in the last 168 hours. Coagulation Profile: No results for input(s): INR, PROTIME in the last 168 hours. Cardiac Enzymes: No results for input(s): CKTOTAL, CKMB, CKMBINDEX, TROPONINI in the last 168 hours. BNP (last 3 results) No results for input(s):  PROBNP in the last 8760 hours. HbA1C: No results for input(s): HGBA1C in the last 72 hours. CBG: No results for input(s): GLUCAP in the last 168 hours. Lipid Profile: No results for input(s): CHOL, HDL, LDLCALC, TRIG, CHOLHDL, LDLDIRECT in the last 72 hours. Thyroid Function Tests: No results for input(s): TSH, T4TOTAL, FREET4, T3FREE, THYROIDAB in the last 72 hours. Anemia Panel: No results for input(s): VITAMINB12, FOLATE, FERRITIN, TIBC, IRON, RETICCTPCT in the last 72 hours. Urine analysis:    Component Value Date/Time   COLORURINE YELLOW 12/01/2015 0638   APPEARANCEUR CLOUDY (A) 12/01/2015 0638   LABSPEC 1.016 12/01/2015 0638   PHURINE 5.0 12/01/2015 0638   GLUCOSEU NEGATIVE 12/01/2015 0638   HGBUR NEGATIVE 12/01/2015 0638   BILIRUBINUR NEGATIVE 12/01/2015 0638   KETONESUR NEGATIVE 12/01/2015 0638   PROTEINUR NEGATIVE 12/01/2015 0638   UROBILINOGEN 0.2 02/15/2015 1615   NITRITE NEGATIVE 12/01/2015 0638   LEUKOCYTESUR NEGATIVE 12/01/2015 7225     )No results found for this or any previous visit (from the past 240 hour(s)).    Anti-infectives (From admission, onward)   None       Radiology Studies: Dg Chest 2 View  Result Date: 09/21/2017 CLINICAL DATA:  Dyspnea EXAM: CHEST - 2 VIEW COMPARISON:  04/01/2017 chest radiograph. FINDINGS: Stable cardiomediastinal silhouette with mild cardiomegaly. No pneumothorax. No pleural effusion. Borderline mild pulmonary edema. IMPRESSION: Borderline mild congestive heart failure. Electronically Signed   By: Delbert Phenix M.D.   On: 09/21/2017 18:28        Scheduled Meds: . acidophilus  1 capsule Oral Daily  . aspirin EC  81 mg Oral Daily  . atorvastatin  20 mg Oral q1800  . carvedilol  3.125 mg Oral BID WC  . cloNIDine  0.1 mg Oral TID  . donepezil  10 mg Oral QHS  . furosemide  40 mg Intravenous BID  . guaiFENesin  600 mg Oral BID  . heparin  5,000 Units Subcutaneous Q8H  . hydrALAZINE  100 mg Oral TID  .  ipratropium-albuterol  3 mL Nebulization Q6H  . lisinopril  40 mg Oral Daily  . metolazone  2.5 mg Oral Daily  . pantoprazole  40 mg Oral Daily  . sertraline  50 mg Oral Daily   Continuous Infusions:   LOS: 1 day    Time spent: 35 min    Joseph Art, DO [facility] Pager [phone]  If 7PM-7AM, please contact night-coverage [email or web address] Password [facility] 09/22/2017, 11:57 AM

## 2017-09-22 NOTE — Plan of Care (Signed)
  Problem: Activity: Goal: Risk for activity intolerance will decrease Outcome: Progressing   Problem: Safety: Goal: Ability to remain free from injury will improve Outcome: Progressing   

## 2017-09-22 NOTE — Telephone Encounter (Signed)
Echo orders placed in medical records for faxing to [facility], attn julia, at the numbers provided.

## 2017-09-22 NOTE — Plan of Care (Signed)
  Problem: Skin Integrity: Goal: Risk for impaired skin integrity will decrease Outcome: Progressing   Problem: Activity: Goal: Capacity to carry out activities will improve Outcome: Progressing   Problem: Elimination: Goal: Will not experience complications related to bowel motility Outcome: Completed/Met  Pt. Bowels moving regularly

## 2017-09-23 DIAGNOSIS — N179 Acute kidney failure, unspecified: Secondary | ICD-10-CM

## 2017-09-23 LAB — CBC
HCT: 28 % — ABNORMAL LOW (ref 36.0–46.0)
Hemoglobin: 8.4 g/dL — ABNORMAL LOW (ref 12.0–15.0)
MCH: 20.8 pg — ABNORMAL LOW (ref 26.0–34.0)
MCHC: 30 g/dL (ref 30.0–36.0)
MCV: 69.3 fL — ABNORMAL LOW (ref 78.0–100.0)
PLATELETS: 246 10*3/uL (ref 150–400)
RBC: 4.04 MIL/uL (ref 3.87–5.11)
RDW: 20.2 % — AB (ref 11.5–15.5)
WBC: 12.5 10*3/uL — AB (ref 4.0–10.5)

## 2017-09-23 LAB — BASIC METABOLIC PANEL
ANION GAP: 12 (ref 5–15)
BUN: 64 mg/dL — ABNORMAL HIGH (ref 6–20)
CALCIUM: 8.5 mg/dL — AB (ref 8.9–10.3)
CO2: 23 mmol/L (ref 22–32)
Chloride: 100 mmol/L — ABNORMAL LOW (ref 101–111)
Creatinine, Ser: 1.7 mg/dL — ABNORMAL HIGH (ref 0.44–1.00)
GFR calc non Af Amer: 31 mL/min — ABNORMAL LOW (ref >60)
GFR, EST AFRICAN AMERICAN: 36 mL/min — AB (ref >60)
Glucose, Bld: 132 mg/dL — ABNORMAL HIGH (ref 65–99)
Potassium: 3.8 mmol/L (ref 3.5–5.1)
Sodium: 135 mmol/L (ref 135–145)

## 2017-09-23 LAB — MRSA PCR SCREENING: MRSA BY PCR: NEGATIVE

## 2017-09-23 MED ORDER — FUROSEMIDE 80 MG PO TABS: 80.0000 mg | ORAL_TABLET | Freq: Every day | ORAL | Status: DC

## 2017-09-23 NOTE — Progress Notes (Signed)
PROGRESS NOTE    [name]  TRV:[id number] DOB: May 29, 1956 DOA: 09/21/2017 PCP: Jackie Plum, MD   Outpatient Specialists:     Brief Narrative:  62 y/o morbidly obese female with chronic respiratory failure and chronic diastolic heart failure who was sent to the ER because they were unable to obtain labs. She also reports being SOB especially when they move and turn her.She has a chronic wheeze and cough which is mildly worse. Her creatinine is somewhat elevated. She denies fevers or chills. She is on PO lasix and reports a slightly decrease PO liquid intake. Patient receiving IVF in ER.     Assessment & Plan:   Active Problems:   Morbid obesity (HCC)   CAD (coronary artery disease)   Stroke (HCC)   HLD (hyperlipidemia)   Chronic respiratory failure- on home O2   AKI (acute kidney injury) (HCC)   Moderate dementia with behavioral disturbance   Chronic diastolic heart failure (HCC)   Depression   COPD (chronic obstructive pulmonary disease) (HCC)   CHF (congestive heart failure) (HCC)   SOB (shortness of breath)  SOB -multifactorial -obesity/CHF/deconditioning/anemia Not on O2 any longer  Acute on chronic diastolic heart failure -IV lasix, I/O's, daily weights-- change to Po lasix in AM -oxygen off -duonebs scheduled -difficult to assess volume status due to obesity  Anemia Check Fe panel-- patient refused  Acute kidney injury -improved with diuresis -patient refused labs today  CAD (coronary artery disease) previous cardiac catheterization showed nonobstructive disease in the distal RPLB, otherwise clean coronary artery  Chronic respiratory failure- on home O2? -not on home O2 currently  COPD (chronic obstructive pulmonary disease) (HCC) -stable, duonebs  Stroke [facility]) -bedbound female  Morbid obesity (HCC) Body mass index is 59.96 kg/m.  Moderate dementia with behavioral disturbance -aware, home meds resumed -from  SNF     DVT prophylaxis:  SQ Heparin  Code Status: Full Code   Family Communication: Spoke with daughter who will come later today  Disposition Plan:  From SNF-- back in AM?   Consultants:      Subjective: No current complaints  Objective: Vitals:   09/23/17 0025 09/23/17 0534 09/23/17 1000 09/23/17 1234  BP: (!) 123/56 (!) 140/54 (!) 139/45 (!) 131/47  Pulse: (!) 56 (!) 52 (!) 59 (!) 57  Resp: 18 20 17 18   Temp: 99.4 F (37.4 C) 98.4 F (36.9 C) 98.5 F (36.9 C) 98.4 F (36.9 C)  TempSrc: Oral Oral Oral Oral  SpO2: 97% 96% 97% 96%  Weight:  (!) 139.3 kg (307 lb)      Intake/Output Summary (Last 24 hours) at 09/23/2017 1507 Last data filed at 09/23/2017 1000 Gross per 24 hour  Intake 1420 ml  Output 200 ml  Net 1220 ml   Filed Weights   09/21/17 2107 09/22/17 0418 09/23/17 0534  Weight: (!) 138.8 kg (306 lb) (!) 137.9 kg (304 lb) (!) 139.3 kg (307 lb)    Examination:  General exam: in bed, laying flat, NAD Respiratory system: no increased work of breathing Cardiovascular system: rrr Psychiatry: flat affect, poor eye contact    Data Reviewed: I have personally reviewed following labs and imaging studies  CBC: Recent Labs  Lab 09/21/17 1734 09/22/17 0716  WBC 11.1* 12.6*  NEUTROABS 7.2  --   HGB 9.3* 8.5*  HCT 31.2* 28.2*  MCV 69.3* 69.3*  PLT 283 261   Basic Metabolic Panel: Recent Labs  Lab 09/21/17 1734 09/22/17 0716  NA 140 139  K 4.1  3.7  CL 104 103  CO2 26 27  GLUCOSE 116* 110*  BUN 53* 48*  CREATININE 1.30* 1.04*  CALCIUM 8.8* 8.8*   GFR: Estimated Creatinine Clearance: 72.5 mL/min (A) (by C-G formula based on SCr of 1.04 mg/dL (H)). Liver Function Tests: Recent Labs  Lab 09/21/17 1734  AST 21  ALT 20  ALKPHOS 86  BILITOT 0.5  PROT 7.9  ALBUMIN 3.4*   No results for input(s): LIPASE, AMYLASE in the last 168 hours. No results for input(s): AMMONIA in the last 168 hours. Coagulation Profile: No results for  input(s): INR, PROTIME in the last 168 hours. Cardiac Enzymes: No results for input(s): CKTOTAL, CKMB, CKMBINDEX, TROPONINI in the last 168 hours. BNP (last 3 results) No results for input(s): PROBNP in the last 8760 hours. HbA1C: No results for input(s): HGBA1C in the last 72 hours. CBG: No results for input(s): GLUCAP in the last 168 hours. Lipid Profile: No results for input(s): CHOL, HDL, LDLCALC, TRIG, CHOLHDL, LDLDIRECT in the last 72 hours. Thyroid Function Tests: No results for input(s): TSH, T4TOTAL, FREET4, T3FREE, THYROIDAB in the last 72 hours. Anemia Panel: No results for input(s): VITAMINB12, FOLATE, FERRITIN, TIBC, IRON, RETICCTPCT in the last 72 hours. Urine analysis:    Component Value Date/Time   COLORURINE YELLOW 12/01/2015 0638   APPEARANCEUR CLOUDY (A) 12/01/2015 0638   LABSPEC 1.016 12/01/2015 0638   PHURINE 5.0 12/01/2015 0638   GLUCOSEU NEGATIVE 12/01/2015 0638   HGBUR NEGATIVE 12/01/2015 0638   BILIRUBINUR NEGATIVE 12/01/2015 0638   KETONESUR NEGATIVE 12/01/2015 0638   PROTEINUR NEGATIVE 12/01/2015 0638   UROBILINOGEN 0.2 02/15/2015 1615   NITRITE NEGATIVE 12/01/2015 0638   LEUKOCYTESUR NEGATIVE 12/01/2015 4098     ) Recent Results (from the past 240 hour(s))  MRSA PCR Screening     Status: None   Collection Time: 09/22/17 10:42 PM  Result Value Ref Range Status   MRSA by PCR NEGATIVE NEGATIVE Final    Comment:        The GeneXpert MRSA Assay (FDA approved for NASAL specimens only), is one component of a comprehensive MRSA colonization surveillance program. It is not intended to diagnose MRSA infection nor to guide or monitor treatment for MRSA infections. Performed at [facility], [address] [place], [place] [address]       Anti-infectives (From admission, onward)   None       Radiology Studies: Dg Chest 2 View  Result Date: 09/21/2017 CLINICAL DATA:  Dyspnea EXAM: CHEST - 2 VIEW COMPARISON:  04/01/2017 chest  radiograph. FINDINGS: Stable cardiomediastinal silhouette with mild cardiomegaly. No pneumothorax. No pleural effusion. Borderline mild pulmonary edema. IMPRESSION: Borderline mild congestive heart failure. Electronically Signed   By: Delbert Phenix M.D.   On: 09/21/2017 18:28        Scheduled Meds: . acidophilus  1 capsule Oral Daily  . aspirin EC  81 mg Oral Daily  . atorvastatin  20 mg Oral q1800  . carvedilol  3.125 mg Oral BID WC  . cloNIDine  0.1 mg Oral TID  . donepezil  10 mg Oral QHS  . furosemide  40 mg Intravenous BID  . guaiFENesin  600 mg Oral BID  . heparin  5,000 Units Subcutaneous Q8H  . hydrALAZINE  100 mg Oral TID  . lisinopril  40 mg Oral Daily  . metolazone  2.5 mg Oral Daily  . pantoprazole  40 mg Oral Daily  . sertraline  50 mg Oral Daily   Continuous  Infusions:   LOS: 2 days    Time spent: 25 min    Joseph Art, DO [facility] Pager [phone]  If 7PM-7AM, please contact night-coverage [email or web address] Password [email or web address] 09/23/2017, 3:07 PM

## 2017-09-24 DIAGNOSIS — I25118 Atherosclerotic heart disease of native coronary artery with other forms of angina pectoris: Secondary | ICD-10-CM

## 2017-09-24 MED ORDER — CLONIDINE HCL 0.1 MG PO TABS: 0.1000 mg | ORAL_TABLET | Freq: Three times a day (TID) | ORAL | 11 refills | Status: AC

## 2017-09-24 MED ORDER — LISINOPRIL 40 MG PO TABS: 40.0000 mg | ORAL_TABLET | Freq: Every day | ORAL | Status: AC

## 2017-09-24 MED ORDER — FERROUS SULFATE 325 (65 FE) MG PO TABS: 325.0000 mg | ORAL_TABLET | Freq: Every day | ORAL | 3 refills | Status: AC

## 2017-09-24 MED ORDER — BACLOFEN 10 MG PO TABS: 10.0000 mg | ORAL_TABLET | Freq: Three times a day (TID) | ORAL | 2 refills | Status: AC | PRN

## 2017-09-24 MED ORDER — POLYETHYLENE GLYCOL 3350 17 G PO PACK: 17.0000 g | PACK | Freq: Every day | ORAL | 0 refills | Status: AC | PRN

## 2017-09-24 MED ORDER — FUROSEMIDE 80 MG PO TABS: 80.0000 mg | ORAL_TABLET | Freq: Every day | ORAL | 2 refills | Status: AC

## 2017-09-24 MED ORDER — IPRATROPIUM-ALBUTEROL 0.5-2.5 (3) MG/3ML IN SOLN: 3.0000 mL | Freq: Four times a day (QID) | RESPIRATORY_TRACT | Status: AC | PRN

## 2017-09-24 NOTE — Clinical Social Work Note (Signed)
Clinical Social Worker facilitated patient discharge including contacting patient family and facility to confirm patient discharge plans.  Clinical information faxed to facility and family agreeable with plan.  CSW arranged ambulance transport via [facility] to [place].  RN to call report prior to discharge [phone]).  Clinical Social Worker will sign off for now as social work intervention is no longer needed. Please consult [place] again if new need arises.  Macario Golds, [place] [phone]

## 2017-09-24 NOTE — Clinical Social Work Note (Deleted)
CSW Geneticist, molecular at [facility] that per MD note, patient may return tomorrow.  Charlynn Court, CSW [phone]

## 2017-09-24 NOTE — Progress Notes (Signed)
Report called to [facility]. Staff familiar with pt from previous stay. Family in attendance with patient at this time.  Pt demonstrates no distress. All personal belongings with pt.  AVS with patient and transport papers

## 2017-09-24 NOTE — Discharge Summary (Signed)
Physician Discharge Summary  [name]:[id number] DOB: June 27, 1955 DOA: 09/21/2017  PCP: Jackie Plum, MD  Admit date: 09/21/2017 Discharge date: 09/24/2017   Recommendations for Outpatient Follow-Up:   1. BMP Monday, Iron panel if patient allows 2. Hold lasix until Saturday-- may need adjustment after BMP on Monday- dose is 80 mg q am and 40 mg q PM 3. Be sure patient is on a heart healthy diet with fluid restrictions to 2L/day 4. nocturnal O2--2L (had sleep study in 2016) 5. 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 PRN nebs   Discharge Diagnosis:   Active Problems:   Morbid obesity (HCC)   CAD (coronary artery disease)   Stroke (HCC)   HLD (hyperlipidemia)   Chronic respiratory failure- on home O2   AKI (acute kidney injury) (HCC)   Moderate dementia with behavioral disturbance   Chronic diastolic heart failure (HCC)   Depression   COPD (chronic obstructive pulmonary disease) (HCC)   CHF (congestive heart failure) (HCC)   SOB (shortness of breath)   Discharge disposition:  SNF:  Discharge Condition: stable  Diet recommendation: Low sodium, heart healthy with fluid restrictions  Wound care: None.   History of Present Illness:   62 y/o morbidly obese female with chronic respiratory failure and chronic diastolic heart failure who was sent to the ER because they were unable to obtain labs. She also reports being SOB especially when they move and turn her.She has a chronic wheeze and cough which is mildly worse. Her creatinine is somewhat elevated. She denies fevers or chills. She is on PO lasix and reports a slightly decrease PO liquid intake. Patient receiving IVF in ER. Daughter at bedside, poor historian.      Hospital Course by Problem:   SOB -multifactorial -obesity/fluid/deconditioning/anemia/COPD Per chart  review, should be on 2L O2 at night/sleeping-- would resume -see above palliative care consult  Acuteon chronicdiastolic heart failure -difficult to assess volume status due to obesity -patient not cooperative with daily labs -have held diuretics as Cr increased yesterday--can resume on Saturday with a BMP Monday  Anemia-- suspect Fe def Check Fe panel-- patient refused here -have added iron supplement as MCV low  Acute kidney injury -lasix held -patient with good urine output-- plan to start lasix again on Saturday  CAD (coronary artery disease) previous cardiac catheterization showed nonobstructive diseasein the distal RPLB, otherwise clean coronary artery  Chronic respiratory failure-  -home O2 at night  COPD (chronic obstructive pulmonary disease) (HCC) -duonebs PRN  Stroke [facility]) -bedbound female  Morbid obesity (HCC) Body mass index is 59.96 kg/m.  Moderate dementia with behavioral disturbance -from SNF -thought to be lewy body dementia   Difficult situation-- have briefly discussed palliative care with daughter and poor overall prognosis.  Consider referral to palliative care from facility.  Behavior issues/body habitus make it difficult to assess fluid status.     Medical Consultants:    None.   Discharge Exam:   Vitals:   09/24/17 0536 09/24/17 0900  BP: (!) 151/54 (!) 144/54  Pulse: (!) 51 (!) 55  Resp:    Temp: 97.8 F (36.6 C) 97.9 F (36.6 C)  SpO2: 100% 95%   Vitals:   09/24/17 0536 09/24/17 0539 09/24/17 0627 09/24/17 0900  BP: (!) 151/54   (!) 144/54  Pulse: (!) 51   (!) 55  Resp:  Temp: 97.8 F (36.6 C)   97.9 F (36.6 C)  TempSrc: Oral   Oral  SpO2: 100%   95%  Weight:  (!) 140.5 kg (309 lb 11.9 oz) (!) 139.1 kg (306 lb 10.6 oz)     Gen:  In bed-- refusing to cough, or turn "I don't want to"   The results of significant diagnostics from this hospitalization (including imaging, microbiology, ancillary and  laboratory) are listed below for reference.     Procedures and Diagnostic Studies:   Dg Chest 2 View  Result Date: 09/21/2017 CLINICAL DATA:  Dyspnea EXAM: CHEST - 2 VIEW COMPARISON:  04/01/2017 chest radiograph. FINDINGS: Stable cardiomediastinal silhouette with mild cardiomegaly. No pneumothorax. No pleural effusion. Borderline mild pulmonary edema. IMPRESSION: Borderline mild congestive heart failure. Electronically Signed   By: Delbert Phenix M.D.   On: 09/21/2017 18:28     Labs:   Basic Metabolic Panel: Recent Labs  Lab 09/21/17 1734 09/22/17 0716 09/23/17 1449  NA 140 139 135  K 4.1 3.7 3.8  CL 104 103 100*  CO2 26 27 23   GLUCOSE 116* 110* 132*  BUN 53* 48* 64*  CREATININE 1.30* 1.04* 1.70*  CALCIUM 8.8* 8.8* 8.5*   GFR Estimated Creatinine Clearance: 44.3 mL/min (A) (by C-G formula based on SCr of 1.7 mg/dL (H)). Liver Function Tests: Recent Labs  Lab 09/21/17 1734  AST 21  ALT 20  ALKPHOS 86  BILITOT 0.5  PROT 7.9  ALBUMIN 3.4*   No results for input(s): LIPASE, AMYLASE in the last 168 hours. No results for input(s): AMMONIA in the last 168 hours. Coagulation profile No results for input(s): INR, PROTIME in the last 168 hours.  CBC: Recent Labs  Lab 09/21/17 1734 09/22/17 0716 09/23/17 1449  WBC 11.1* 12.6* 12.5*  NEUTROABS 7.2  --   --   HGB 9.3* 8.5* 8.4*  HCT 31.2* 28.2* 28.0*  MCV 69.3* 69.3* 69.3*  PLT 283 261 246   Cardiac Enzymes: No results for input(s): CKTOTAL, CKMB, CKMBINDEX, TROPONINI in the last 168 hours. BNP: Invalid input(s): POCBNP CBG: No results for input(s): GLUCAP in the last 168 hours. D-Dimer No results for input(s): DDIMER in the last 72 hours. Hgb A1c No results for input(s): HGBA1C in the last 72 hours. Lipid Profile No results for input(s): CHOL, HDL, LDLCALC, TRIG, CHOLHDL, LDLDIRECT in the last 72 hours. Thyroid function studies No results for input(s): TSH, T4TOTAL, T3FREE, THYROIDAB in the last 72  hours.  Invalid input(s): FREET3 Anemia work up No results for input(s): VITAMINB12, FOLATE, FERRITIN, TIBC, IRON, RETICCTPCT in the last 72 hours. Microbiology Recent Results (from the past 240 hour(s))  MRSA PCR Screening     Status: None   Collection Time: 09/22/17 10:42 PM  Result Value Ref Range Status   MRSA by PCR NEGATIVE NEGATIVE Final    Comment:        The GeneXpert MRSA Assay (FDA approved for NASAL specimens only), is one component of a comprehensive MRSA colonization surveillance program. It is not intended to diagnose MRSA infection nor to guide or monitor treatment for MRSA infections. Performed at [facility], [address], [place], [place] [address]      Discharge Instructions:   Discharge Instructions    (HEART FAILURE PATIENTS) Call MD:  Anytime you have any of the following symptoms: 1) 3 pound weight gain in 24 hours or 5 pounds in 1 week 2) shortness of breath, with or without a dry hacking cough 3)  swelling in the hands, feet or stomach 4) if you have to sleep on extra pillows at night in order to breathe.   Complete by:  As directed    Diet - low sodium heart healthy   Complete by:  As directed    Increase activity slowly   Complete by:  As directed      Allergies as of 09/24/2017      Reactions   Penicillins Itching, Nausea And Vomiting   Has patient had a PCN reaction causing immediate rash, facial/tongue/throat swelling, SOB or lightheadedness with hypotension: No Has patient had a PCN reaction causing severe rash involving mucus membranes or skin necrosis: No Has patient had a PCN reaction that required hospitalization: No Has patient had a PCN reaction occurring within the last 10 years: No      Medication List    STOP taking these medications   albuterol 108 (90 Base) MCG/ACT inhaler Commonly known as:  PROVENTIL HFA;VENTOLIN HFA   HYDROcodone-acetaminophen 5-325 MG tablet Commonly known as:  NORCO/VICODIN   metolazone 2.5  MG tablet Commonly known as:  ZAROXOLYN     TAKE these medications   aspirin 81 MG EC tablet Take 1 tablet (81 mg total) by mouth daily.   atorvastatin 20 MG tablet Commonly known as:  LIPITOR Take 1 tablet (20 mg total) by mouth daily at 6 PM.   baclofen 10 MG tablet Commonly known as:  LIORESAL Take 1 tablet (10 mg total) by mouth 3 (three) times daily as needed for muscle spasms. What changed:    when to take this  reasons to take this   carvedilol 3.125 MG tablet Commonly known as:  COREG Take 3.125 mg by mouth 2 (two) times daily with a meal.   cloNIDine 0.1 MG tablet Commonly known as:  CATAPRES Take 1 tablet (0.1 mg total) by mouth 3 (three) times daily. What changed:    when to take this  reasons to take this   donepezil 10 MG tablet Commonly known as:  ARICEPT Take 10 mg by mouth at bedtime.   ferrous sulfate 325 (65 FE) MG tablet Take 1 tablet (325 mg total) by mouth daily.   furosemide 80 MG tablet Commonly known as:  LASIX Take 1 tablet (80 mg total) by mouth daily. Take 80mg  in the morning and 40mg  in the pm Start taking on:  09/26/2017 What changed:  These instructions start on 09/26/2017. If you are unsure what to do until then, ask your doctor or other care provider.   guaiFENesin 600 MG 12 hr tablet Commonly known as:  MUCINEX Take 600 mg by mouth 2 (two) times daily.   hydrALAZINE 100 MG tablet Commonly known as:  APRESOLINE Take 100 mg by mouth 3 (three) times daily.   INTEGRA PLUS Caps Take 1 capsule by mouth 2 (two) times daily.   ipratropium-albuterol 0.5-2.5 (3) MG/3ML Soln Commonly known as:  DUONEB Take 3 mLs by nebulization every 6 (six) hours as needed.   lisinopril 40 MG tablet Commonly known as:  PRINIVIL,ZESTRIL Take 1 tablet (40 mg total) by mouth daily. Start taking on:  09/28/2017 What changed:  These instructions start on 09/28/2017. If you are unsure what to do until then, ask your doctor or other care provider.    omeprazole 40 MG capsule Commonly known as:  PRILOSEC Take 40 mg by mouth daily.   polyethylene glycol packet Commonly known as:  MIRALAX / GLYCOLAX Take 17 g by mouth daily as needed for  mild constipation.   PROBIOTIC-10 PO Take 1 capsule by mouth daily.   sertraline 50 MG tablet Commonly known as:  ZOLOFT Take 50 mg by mouth daily. Give 1.5 tablet by mouth one time a day      Follow-up Information    Osei-Bonsu, Greggory Stallion, MD Follow up in 1 week(s).   Specialty:  Internal Medicine Why:  BMP on Monday to check Cr Contact information: [address] [place] [address] [phone]            Time coordinating discharge: 35 min  Signed:  Joseph Art   [facility] 09/24/2017, 2:33 PM

## 2017-09-29 ENCOUNTER — Other Ambulatory Visit (HOSPITAL_COMMUNITY): Payer: Medicaid Other

## 2017-10-06 ENCOUNTER — Ambulatory Visit (INDEPENDENT_AMBULATORY_CARE_PROVIDER_SITE_OTHER): Payer: Medicaid Other | Admitting: Physician Assistant

## 2017-10-06 ENCOUNTER — Encounter: Payer: Self-pay | Admitting: Physician Assistant

## 2017-10-06 VITALS — BP 112/60 | HR 62 | Ht 60.0 in

## 2017-10-06 DIAGNOSIS — I2510 Atherosclerotic heart disease of native coronary artery without angina pectoris: Secondary | ICD-10-CM | POA: Diagnosis not present

## 2017-10-06 DIAGNOSIS — Z8673 Personal history of transient ischemic attack (TIA), and cerebral infarction without residual deficits: Secondary | ICD-10-CM

## 2017-10-06 DIAGNOSIS — I5032 Chronic diastolic (congestive) heart failure: Secondary | ICD-10-CM | POA: Diagnosis not present

## 2017-10-06 DIAGNOSIS — I10 Essential (primary) hypertension: Secondary | ICD-10-CM | POA: Diagnosis not present

## 2017-10-06 LAB — BASIC METABOLIC PANEL
BUN/Creatinine Ratio: 56 — ABNORMAL HIGH (ref 12–28)
BUN: 87 mg/dL (ref 8–27)
CHLORIDE: 103 mmol/L (ref 96–106)
CO2: 20 mmol/L (ref 20–29)
Calcium: 8.9 mg/dL (ref 8.7–10.3)
Creatinine, Ser: 1.54 mg/dL — ABNORMAL HIGH (ref 0.57–1.00)
GFR calc Af Amer: 41 mL/min/{1.73_m2} — ABNORMAL LOW (ref >59)
GFR, EST NON AFRICAN AMERICAN: 36 mL/min/{1.73_m2} — AB (ref >59)
Glucose: 127 mg/dL — ABNORMAL HIGH (ref 65–99)
POTASSIUM: 4.3 mmol/L (ref 3.5–5.2)
Sodium: 138 mmol/L (ref 134–144)

## 2017-10-06 NOTE — Patient Instructions (Addendum)
Medication Instructions:  Continue with current medication   Labwork: Your physician recommends that you return for lab work in: [employer])   Testing/Procedures: None  Follow-Up: Your physician recommends that you schedule a follow-up appointment in: 3 months with Dr. Royann Shivers   Any Other Special Instructions Will Be Listed Below (If Applicable). Palliative referral has been sent. Someone should be in touch with you.     If you need a refill on your cardiac medications before your next appointment, please call your pharmacy.

## 2017-10-06 NOTE — Progress Notes (Signed)
Cardiology Office Note    Date:  10/08/2017   ID:  [name], DOB June 14, 1955, MRN [id number]  PCP:  Jackie Plum, MD  Cardiologist:  Dr. Royann Shivers (apparently patient was seen by Dr. Royann Shivers in the hospital in 2016, has not been seen by Dr. Royann Shivers in the clinic)  Chief Complaint  Patient presents with  . Hospitalization Follow-up    trouble breathing, swelling in feet noted by family member, pt denies chest pains at this time.    History of Present Illness:  [name] is a 62 y.o. female with PMH of CAD, HTN, h/o CVA, asthma and tobacco abuse.  She was seen by Tereso Newcomer in 2016.  It was noted she was wheelchair-bound due to knee issues.  She was last admitted in April 2016 with chest discomfort in the setting of uncontrolled high blood pressure. Cardiac catheterization in May 2016 demonstrated 50% stenosis in the third branch of RPLB, this was far too distal to consider PCI.  She otherwise had normal coronaries. Cardiac catheterization also showed hyperdynamic LV with severe systolic hypertension and LVEDP 30-35.  She also noted to have near cavity obliteration. She was readmitted to the hospital in September 2016 with chest pain.  Echocardiogram obtained during September admission showed a normal EF, speckled appearance of myocardium with potential consideration for amyloid.  Attempts were made to obtain cardiac MRI to further evaluate, however she was unable to be fit in the scanner due to obesity.  Also she did not have low voltage on her EKG as well.  She was eventually discharged to follow-up with Dr. Royann Shivers as outpatient to potentially consider fat pad biopsy.  This never occurred.  She was seen in the ED on 04/01/2017 again with chest pain, however this was felt not to be cardiac in nature and more consistent with GI issues.  She was started on PPI and is subsequently discharged. Her weight at the time was 300 lbs and BMI 64.92.  More recently, patient has been  admitted to [facility] for increased care need.  She was diagnosed with microcytic anemia and has upcoming GI referral for positive stool test.  She was also started on metolazone as well for heart failure.  She was seen by her PCP in March 2019, there was some concern of volume overload.  I saw the patient on 09/15/2017, she was transported to the office via EMS on stretcher.  She was unable to move.  She denies any change in the degree of shortness of breath.  Her O2 saturation was 95%.  It was very difficult to assess her volume status based on physical exam due to her morbid obesity.  I did not think her chronic respiratory issue is all due to volume overload, it is likely due to a combination of chronic systolic heart failure, obesity hypoventilation syndrome and deconditioning.  I increased her Lasix as a trial to 80 mg in a.m. and 40 mg in p.m.  I also added a potassium supplement.  She most recently sent to the ED due to inability to obtain labs.  She reports being short of breath when moved and return to her.  Her creatinine was mildly elevated to 1.3 and felt to be dehydrated.  She was given IV fluid in the ED.  She was treated with both acute kidney injury and also acute on chronic diastolic heart failure.  Her Lasix was temporarily held and resumed on Saturday.  She also  had a moderate dementia which was thought to be Lewy body dementia by neurology.  It was recommended to discuss outpatient palliative care.  Although her initial creatinine was 1.3, this improved to 1.04 with hydration, but prior to her discharge, her creatinine was 1.7 on 09/23/2017.  Patient presents today for cardiology office visit.  She was brought here by EMS.  She denies any chest pain however has persistent as of breath at rest.  She was unable to move.  Her daughter is present.  Physical exam, she appears to be euvolemic despite difficulty assessing her volume status given her obesity.  She is  bedbound at this point.  Long-term prognosis is poor.  She will need elective care assessment and discuss goal of life.    Past Medical History:  Diagnosis Date  . Accelerated hypertension   . Arthritis   . Asthma   . Bilateral chronic knee pain   . Chronic bronchitis (HCC)   . Coronary artery disease    a. remote hx of cath ~2000. B. cath  moderate disease in an RPL branch - too distal for PCI; 3rd RPLB lesion, 50% stenosed; hyperdynamic LV with near cavity obliteration & severely elevated LVEDP  . Dementia   . Diastolic CHF (HCC)   . Diverticular disease of large intestine    diverticular abscess in sigmoid region by CT 2009  . Heart attack (HCC)   . Heart murmur   . Hypertension   . Non-ischemic cardiomyopathy (HCC)    cxr - mild cardiomegly, Echo 10/07/14 - severe LVH with LVEF 65-70%.  . Stroke (HCC)   . Wheelchair bound     Past Surgical History:  Procedure Laterality Date  . ABDOMINAL HYSTERECTOMY    . AMPUTATION  08/21/2011   Procedure: AMPUTATION DIGIT;  Surgeon: Sharma Covert, MD;  Location: [facility] OR;  Service: Orthopedics;  Laterality: Right;  . CARDIAC CATHETERIZATION N/A 10/09/2014   Procedure: Left Heart Cath and Coronary Angiography;  Surgeon: Marykay Lex, MD;  Location: [facility] INVASIVE CV LAB CUPID;  Service: Cardiovascular;  Laterality: N/A;  . CARDIAC CATHETERIZATION  10/2014   normal coronary arteries aside for a distal RPLB branch with 50% stenosis  . CORONARY STENT PLACEMENT    . FLEXIBLE SIGMOIDOSCOPY  04/28/2012   Procedure: FLEXIBLE SIGMOIDOSCOPY;  Surgeon: Petra Kuba, MD;  Location: [facility] ENDOSCOPY;  Service: Endoscopy;  Laterality: N/A;  unsedated, unprepped  . FLEXIBLE SIGMOIDOSCOPY N/A 12/10/2015   Procedure: FLEXIBLE SIGMOIDOSCOPY;  Surgeon: Bernette Redbird, MD;  Location: [facility] ENDOSCOPY;  Service: Endoscopy;  Laterality: N/A;  . I&D EXTREMITY  08/18/2011   Procedure: IRRIGATION AND DEBRIDEMENT EXTREMITY;  Surgeon: Sharma Covert, MD;  Location: [facility] OR;   Service: Orthopedics;  Laterality: Right;  I&D Right Long Finger    Current Medications: Outpatient Medications Prior to Visit  Medication Sig Dispense Refill  . aspirin EC 81 MG EC tablet Take 1 tablet (81 mg total) by mouth daily.    Marland Kitchen atorvastatin (LIPITOR) 20 MG tablet Take 1 tablet (20 mg total) by mouth daily at 6 PM. 30 tablet 5  . baclofen (LIORESAL) 10 MG tablet Take 1 tablet (10 mg total) by mouth 3 (three) times daily as needed for muscle spasms. 30 each 2  . carvedilol (COREG) 3.125 MG tablet Take 3.125 mg by mouth 2 (two) times daily with a meal.    . cloNIDine (CATAPRES) 0.1 MG tablet Take 1 tablet (0.1 mg total) by mouth 3 (three) times daily. 60 tablet 11  .  donepezil (ARICEPT) 10 MG tablet Take 10 mg by mouth at bedtime.    . FeFum-FePoly-FA-B Cmp-C-Biot (INTEGRA PLUS) CAPS Take 1 capsule by mouth 2 (two) times daily.    . ferrous sulfate 325 (65 FE) MG tablet Take 1 tablet (325 mg total) by mouth daily. 30 tablet 3  . furosemide (LASIX) 80 MG tablet Take 1 tablet (80 mg total) by mouth daily. Take 80mg  in the morning and 40mg  in the pm 45 tablet 2  . guaiFENesin (MUCINEX) 600 MG 12 hr tablet Take 600 mg by mouth 2 (two) times daily.    . hydrALAZINE (APRESOLINE) 100 MG tablet Take 100 mg by mouth 3 (three) times daily.    Marland Kitchen ipratropium-albuterol (DUONEB) 0.5-2.5 (3) MG/3ML SOLN Take 3 mLs by nebulization every 6 (six) hours as needed. 360 mL   . lisinopril (PRINIVIL,ZESTRIL) 40 MG tablet Take 1 tablet (40 mg total) by mouth daily.    Marland Kitchen omeprazole (PRILOSEC) 40 MG capsule Take 40 mg by mouth daily.    . polyethylene glycol (MIRALAX / GLYCOLAX) packet Take 17 g by mouth daily as needed for mild constipation. 14 each 0  . Probiotic Product (PROBIOTIC-10 PO) Take 1 capsule by mouth daily.    . sertraline (ZOLOFT) 50 MG tablet Take 50 mg by mouth daily. Give 1.5 tablet by mouth one time a day      No facility-administered medications prior to visit.      Allergies:    Penicillins   Social History   Socioeconomic History  . Marital status: Legally Separated    Spouse name: Not on file  . Number of children: Not on file  . Years of education: Not on file  . Highest education level: Not on file  Occupational History  . Not on file  Social Needs  . Financial resource strain: Not on file  . Food insecurity:    Worry: Not on file    Inability: Not on file  . Transportation needs:    Medical: Not on file    Non-medical: Not on file  Tobacco Use  . Smoking status: Current Every Day Smoker    Packs/day: 0.50    Years: 20.00    Pack years: 10.00    Types: Cigarettes  . Smokeless tobacco: Former Neurosurgeon    Types: Snuff  Substance and Sexual Activity  . Alcohol use: Yes    Alcohol/week: 3.6 oz    Types: 6 Cans of beer per week    Comment: 04/27/12- "amount varies"  . Drug use: No  . Sexual activity: Never  Lifestyle  . Physical activity:    Days per week: Not on file    Minutes per session: Not on file  . Stress: Not on file  Relationships  . Social connections:    Talks on phone: Not on file    Gets together: Not on file    Attends religious service: Not on file    Active member of club or organization: Not on file    Attends meetings of clubs or organizations: Not on file    Relationship status: Not on file  Other Topics Concern  . Not on file  Social History Narrative   ** Merged History Encounter **         Family History:  The patient's family history includes Arthritis in her mother; Asthma in her sister; Bronchitis in her sister; Diabetes type I in her brother, mother, and sister; Diabetes type II in her brother; Healthy in  her brother, brother, and brother; Heart disease in her brother; Hypertension in her brother, mother, and sister; Stroke in her sister.   ROS:   Please see the history of present illness.    ROS All other systems reviewed and are negative.   PHYSICAL EXAM:   VS:  BP 112/60 (BP Location: Right Arm)   Pulse  62   Ht 5' (1.524 m)   BMI 59.89 kg/m    GEN: Bedbound, on EMS stretcher HEENT: normal  Neck: no JVD, carotid bruits, or masses Cardiac: RRR; no murmurs, rubs, or gallops,no edema  Respiratory: Anterior exam clear to auscultation GI: soft, nontender, nondistended, + BS MS: no deformity or atrophy  Skin: warm and dry, no rash Neuro:  Alert, able to respond to questions, however slow to respond Psych: flat affect  Wt Readings from Last 3 Encounters:  09/24/17 (!) 306 lb 10.6 oz (139.1 kg)  04/01/17 300 lb (136.1 kg)  09/02/16 275 lb (124.7 kg)      Studies/Labs Reviewed:   EKG:  EKG is not ordered today.   Recent Labs: 09/21/2017: ALT 20 09/22/2017: B Natriuretic Peptide 97.7 09/23/2017: Hemoglobin 8.4; Platelets 246 10/06/2017: BUN 87; Creatinine, Ser 1.54; Potassium 4.3; Sodium 138   Lipid Panel    Component Value Date/Time   CHOL 169 09/03/2016   TRIG 129 09/03/2016   HDL 51 09/03/2016   CHOLHDL 3.3 10/07/2014 0442   VLDL 22 10/07/2014 0442   LDLCALC 92 09/03/2016    Additional studies/ records that were reviewed today include:   Cath 10/09/2014 Conclusion    Angiographicall normal Coronary Arteries with moderate disease in an RPL branch - too distal for PCI  3rd RPLB lesion, 50% stenosed.  Hyperdynamic LV with near cavity obliteration & severely elevated LVEDP  Severe systemic HTN   Etiology for + Troponin is most likely related to Accelerated HTN. No Angiographically significant CAD. Hyperdynamic LV with Severe Systolic HTN & LVEDP of ~30-35 mmHg - but near cavity obliteration   Plan: Standard post Radial Cath Care with TR Band removal. Aggressive BP management  Discharge planning per primary team - once BP controlled.      Echo 02/20/2015 LV EF: 55% -   60% Study Conclusions  - Left ventricle: The cavity size was normal. Wall thickness was   increased in a pattern of severe LVH. Systolic function was   normal. The estimated ejection fraction  was in the range of 55%   to 60%. Wall motion was normal; there were no regional wall   motion abnormalities. Doppler parameters are consistent with   restrictive physiology, indicative of decreased left ventricular   diastolic compliance and/or increased left atrial pressure.   Doppler parameters are consistent with high ventricular filling   pressure. - Mitral valve: Calcified annulus. - Left atrium: The atrium was mildly dilated. - Pericardium, extracardiac: A small pericardial effusion was   identified.  Impressions:  - Normal LV systolic function; restrictive filling with elevated LV   filling pressure; severe LVH; mild LAE; small pericardial   effusion; myocardium with speckled appearance; consider amyloid.   ASSESSMENT:    1. Chronic diastolic heart failure (HCC)   2. Coronary artery disease involving native coronary artery of native heart without angina pectoris   3. Essential hypertension   4. H/O: CVA (cerebrovascular accident)      PLAN:  In order of problems listed above:  1. Chronic diastolic heart failure: Recently presented to the hospital with AKI, discharged on the same  home diuretic dose.  I will obtain a basic metabolic panel, could potentially consider reducing the diuretic if renal function has not returned back to baseline.  2. Chronic respiratory failure: She is essentially bedbound, has very poor quality of life and poor long-term prognosis.  Her chronic respiratory failure likely is due to a combination of obesity hypoventilation syndrome, deconditioning and to a small degree chronic diastolic heart failure  3. Abnormal echocardiogram: Previous echocardiogram showed severe LVH with speckled appears consistent with amyloid.  She was supposed to be discharged to follow-up with Dr. Royann Shivers was outpatient to consider fat pad biopsy.  Given her current status and poor overall outcome, I do not think this work-up will improve her overall condition.  4. CAD:  Denies any chest pain.  Previous cardiac catheterization showed nonobstructive disease in the distal RPLB, otherwise clean coronary  5. Hypertension: Continue on current medication  6. Morbid obesity: Poor overall outcome.  She is bedbound by this point.  Outpatient palliative care consult.    Medication Adjustments/Labs and Tests Ordered: Current medicines are reviewed at length with the patient today.  Concerns regarding medicines are outlined above.  Medication changes, Labs and Tests ordered today are listed in the Patient Instructions below. Patient Instructions  Medication Instructions:  Continue with current medication   Labwork: Your physician recommends that you return for lab work in: Today Designer, jewellery)   Testing/Procedures: None  Follow-Up: Your physician recommends that you schedule a follow-up appointment in: 3 months with Dr. Royann Shivers   Any Other Special Instructions Will Be Listed Below (If Applicable). Palliative referral has been sent. Someone should be in touch with you.     If you need a refill on your cardiac medications before your next appointment, please call your pharmacy.      Ramond Dial, [place]  10/08/2017 10:52 PM    [facility] [address] [place], [place], [place]  [address] Phone: [phone]; Fax: [phone]

## 2017-10-08 ENCOUNTER — Encounter: Payer: Self-pay | Admitting: Physician Assistant

## 2017-12-07 DEATH — deceased

## 2018-01-25 ENCOUNTER — Ambulatory Visit: Payer: Medicaid Other | Admitting: Cardiovascular Disease

## 2018-01-26 ENCOUNTER — Encounter: Payer: Self-pay | Admitting: *Deleted
